# Patient Record
Sex: Female | Born: 1945 | Race: White | Hispanic: No | Marital: Married | Smoking: Former smoker
Health system: Southern US, Community
[De-identification: ages and names within clinical notes are randomized; demographics above are authoritative.]

## PROBLEM LIST (undated history)

## (undated) DIAGNOSIS — R29898 Other symptoms and signs involving the musculoskeletal system: Secondary | ICD-10-CM

## (undated) DIAGNOSIS — D6861 Antiphospholipid syndrome: Secondary | ICD-10-CM

## (undated) DIAGNOSIS — I099 Rheumatic heart disease, unspecified: Secondary | ICD-10-CM

## (undated) DIAGNOSIS — N189 Chronic kidney disease, unspecified: Secondary | ICD-10-CM

## (undated) DIAGNOSIS — M1990 Unspecified osteoarthritis, unspecified site: Secondary | ICD-10-CM

## (undated) DIAGNOSIS — R011 Cardiac murmur, unspecified: Secondary | ICD-10-CM

## (undated) DIAGNOSIS — Z950 Presence of cardiac pacemaker: Secondary | ICD-10-CM

## (undated) DIAGNOSIS — D75829 Heparin-induced thrombocytopenia, unspecified: Secondary | ICD-10-CM

## (undated) DIAGNOSIS — E785 Hyperlipidemia, unspecified: Secondary | ICD-10-CM

## (undated) DIAGNOSIS — I4892 Unspecified atrial flutter: Secondary | ICD-10-CM

## (undated) DIAGNOSIS — I639 Cerebral infarction, unspecified: Secondary | ICD-10-CM

## (undated) DIAGNOSIS — I509 Heart failure, unspecified: Secondary | ICD-10-CM

## (undated) DIAGNOSIS — Z9289 Personal history of other medical treatment: Secondary | ICD-10-CM

## (undated) DIAGNOSIS — Z8639 Personal history of other endocrine, nutritional and metabolic disease: Secondary | ICD-10-CM

## (undated) DIAGNOSIS — M7989 Other specified soft tissue disorders: Secondary | ICD-10-CM

## (undated) DIAGNOSIS — D7582 Heparin induced thrombocytopenia (HIT): Secondary | ICD-10-CM

## (undated) DIAGNOSIS — E039 Hypothyroidism, unspecified: Secondary | ICD-10-CM

## (undated) DIAGNOSIS — D693 Immune thrombocytopenic purpura: Secondary | ICD-10-CM

## (undated) DIAGNOSIS — S065X9A Traumatic subdural hemorrhage with loss of consciousness of unspecified duration, initial encounter: Secondary | ICD-10-CM

## (undated) DIAGNOSIS — R29818 Other symptoms and signs involving the nervous system: Secondary | ICD-10-CM

## (undated) DIAGNOSIS — Y9389 Activity, other specified: Secondary | ICD-10-CM

## (undated) DIAGNOSIS — I10 Essential (primary) hypertension: Secondary | ICD-10-CM

## (undated) DIAGNOSIS — D649 Anemia, unspecified: Secondary | ICD-10-CM

## (undated) DIAGNOSIS — I890 Lymphedema, not elsewhere classified: Secondary | ICD-10-CM

## (undated) HISTORY — PX: TONSILLECTOMY: SHX5217

## (undated) HISTORY — DX: Cerebral infarction, unspecified: I63.9

## (undated) HISTORY — DX: Unspecified atrial flutter: I48.92

## (undated) HISTORY — DX: Antiphospholipid syndrome: D68.61

## (undated) HISTORY — DX: Essential (primary) hypertension: I10

## (undated) HISTORY — PX: AMPUTATION: SHX166

## (undated) HISTORY — PX: TONSILLECTOMY: SUR1361

## (undated) HISTORY — DX: Traumatic subdural hemorrhage with loss of consciousness of unspecified duration, initial encounter: S06.5X9A

## (undated) HISTORY — DX: Personal history of other endocrine, nutritional and metabolic disease: Z86.39

## (undated) HISTORY — DX: Immune thrombocytopenic purpura: D69.3

## (undated) HISTORY — DX: Lymphedema, not elsewhere classified: I89.0

## (undated) HISTORY — DX: Activity, other specified: Y93.89

## (undated) HISTORY — DX: Hyperlipidemia, unspecified: E78.5

## (undated) HISTORY — DX: Heparin induced thrombocytopenia (HIT): D75.82

## (undated) HISTORY — PX: TOTAL ABDOMINAL HYSTERECTOMY: SHX209

## (undated) HISTORY — DX: Heparin-induced thrombocytopenia, unspecified: D75.829

## (undated) HISTORY — DX: Other specified soft tissue disorders: M79.89

## (undated) HISTORY — PX: CARDIAC VALVE REPLACEMENT: SHX585

## (undated) HISTORY — DX: Rheumatic heart disease, unspecified: I09.9

## (undated) HISTORY — PX: ATRIAL ABLATION SURGERY: SHX560

## (undated) HISTORY — PX: CARDIOVERSION: SHX1299

## (undated) HISTORY — PX: CHOLECYSTECTOMY: SHX55

---

## 1993-06-26 DIAGNOSIS — I099 Rheumatic heart disease, unspecified: Secondary | ICD-10-CM

## 1993-06-26 HISTORY — PX: PERCUTANEOUS BALLOON VALVULOPLASTY: SHX270

## 1993-06-26 HISTORY — DX: Rheumatic heart disease, unspecified: I09.9

## 1998-01-15 ENCOUNTER — Other Ambulatory Visit: Admission: RE | Admit: 1998-01-15 | Discharge: 1998-01-15 | Payer: Self-pay | Admitting: Obstetrics and Gynecology

## 2001-09-25 ENCOUNTER — Encounter: Payer: Self-pay | Admitting: Gastroenterology

## 2001-09-25 ENCOUNTER — Encounter: Admission: RE | Admit: 2001-09-25 | Discharge: 2001-09-25 | Payer: Self-pay | Admitting: Gastroenterology

## 2001-09-26 ENCOUNTER — Encounter: Admission: RE | Admit: 2001-09-26 | Discharge: 2001-09-26 | Payer: Self-pay | Admitting: Obstetrics & Gynecology

## 2001-09-26 ENCOUNTER — Encounter: Payer: Self-pay | Admitting: Obstetrics & Gynecology

## 2004-05-11 ENCOUNTER — Ambulatory Visit: Payer: Self-pay | Admitting: Oncology

## 2004-06-08 ENCOUNTER — Ambulatory Visit (HOSPITAL_COMMUNITY): Admission: RE | Admit: 2004-06-08 | Discharge: 2004-06-08 | Payer: Self-pay | Admitting: Cardiology

## 2004-06-08 ENCOUNTER — Encounter (INDEPENDENT_AMBULATORY_CARE_PROVIDER_SITE_OTHER): Payer: Self-pay | Admitting: Cardiology

## 2004-07-11 ENCOUNTER — Ambulatory Visit: Payer: Self-pay | Admitting: Oncology

## 2004-08-29 ENCOUNTER — Ambulatory Visit: Payer: Self-pay | Admitting: Oncology

## 2004-10-14 ENCOUNTER — Ambulatory Visit: Payer: Self-pay | Admitting: Oncology

## 2004-11-02 ENCOUNTER — Encounter: Admission: RE | Admit: 2004-11-02 | Discharge: 2004-11-02 | Payer: Self-pay | Admitting: Gastroenterology

## 2004-11-17 ENCOUNTER — Encounter: Payer: Self-pay | Admitting: Cardiology

## 2004-11-18 ENCOUNTER — Encounter: Payer: Self-pay | Admitting: Cardiology

## 2004-12-15 ENCOUNTER — Ambulatory Visit: Payer: Self-pay | Admitting: Oncology

## 2005-02-16 ENCOUNTER — Ambulatory Visit: Payer: Self-pay | Admitting: Oncology

## 2005-04-18 ENCOUNTER — Ambulatory Visit: Payer: Self-pay | Admitting: Oncology

## 2005-06-15 ENCOUNTER — Ambulatory Visit: Payer: Self-pay | Admitting: Oncology

## 2005-06-26 HISTORY — PX: MITRAL VALVE REPLACEMENT: SHX147

## 2005-07-31 ENCOUNTER — Ambulatory Visit: Payer: Self-pay | Admitting: Oncology

## 2005-09-22 ENCOUNTER — Ambulatory Visit: Payer: Self-pay | Admitting: Oncology

## 2005-10-20 LAB — CBC WITH DIFFERENTIAL/PLATELET
Basophils Absolute: 0.1 10*3/uL (ref 0.0–0.1)
EOS%: 5.5 % (ref 0.0–7.0)
Eosinophils Absolute: 0.2 10*3/uL (ref 0.0–0.5)
HCT: 30.3 % — ABNORMAL LOW (ref 34.8–46.6)
HGB: 10.4 g/dL — ABNORMAL LOW (ref 11.6–15.9)
MCH: 29.7 pg (ref 26.0–34.0)
MCV: 86.9 fL (ref 81.0–101.0)
MONO%: 8.6 % (ref 0.0–13.0)
NEUT%: 64.8 % (ref 39.6–76.8)

## 2005-10-20 LAB — PROTIME-INR

## 2005-11-15 ENCOUNTER — Ambulatory Visit: Payer: Self-pay | Admitting: Oncology

## 2006-01-03 ENCOUNTER — Ambulatory Visit: Payer: Self-pay | Admitting: Oncology

## 2006-01-03 LAB — PROTHROMBIN TIME
INR: 3.7 — ABNORMAL HIGH (ref 0.0–1.5)
Prothrombin Time: 36.4 seconds — ABNORMAL HIGH (ref 11.6–15.2)

## 2006-01-03 LAB — CBC WITH DIFFERENTIAL/PLATELET
BASO%: 0.9 % (ref 0.0–2.0)
EOS%: 4.2 % (ref 0.0–7.0)
MCH: 28.4 pg (ref 26.0–34.0)
MCHC: 33.3 g/dL (ref 32.0–36.0)
MCV: 85.3 fL (ref 81.0–101.0)
MONO%: 5.9 % (ref 0.0–13.0)
NEUT#: 3.9 10*3/uL (ref 1.5–6.5)
RBC: 3.48 10*6/uL — ABNORMAL LOW (ref 3.70–5.32)
RDW: 14.9 % — ABNORMAL HIGH (ref 11.3–14.5)

## 2006-01-03 LAB — PROTIME-INR

## 2006-01-09 ENCOUNTER — Ambulatory Visit (HOSPITAL_COMMUNITY): Admission: RE | Admit: 2006-01-09 | Discharge: 2006-01-09 | Payer: Self-pay | Admitting: Oncology

## 2006-01-11 LAB — PROTIME-INR
INR: 4 — ABNORMAL HIGH (ref 2.00–3.50)
Protime: 24.3 Seconds — ABNORMAL HIGH (ref 10.6–13.4)

## 2006-02-02 LAB — PROTIME-INR: Protime: 16.5 Seconds — ABNORMAL HIGH (ref 10.6–13.4)

## 2006-02-02 LAB — CBC WITH DIFFERENTIAL/PLATELET
Basophils Absolute: 0.1 10*3/uL (ref 0.0–0.1)
Eosinophils Absolute: 0.3 10*3/uL (ref 0.0–0.5)
HGB: 9 g/dL — ABNORMAL LOW (ref 11.6–15.9)
LYMPH%: 18 % (ref 14.0–48.0)
MONO#: 0.4 10*3/uL (ref 0.1–0.9)
NEUT#: 3.6 10*3/uL (ref 1.5–6.5)
Platelets: 87 10*3/uL — ABNORMAL LOW (ref 145–400)
RBC: 3.25 10*6/uL — ABNORMAL LOW (ref 3.70–5.32)
RDW: 13.2 % (ref 11.3–14.5)
WBC: 5.3 10*3/uL (ref 3.9–10.0)

## 2006-02-19 ENCOUNTER — Ambulatory Visit: Payer: Self-pay | Admitting: Oncology

## 2006-02-19 LAB — CBC WITH DIFFERENTIAL/PLATELET
Eosinophils Absolute: 0.2 10*3/uL (ref 0.0–0.5)
LYMPH%: 15.5 % (ref 14.0–48.0)
MONO#: 0.4 10*3/uL (ref 0.1–0.9)
NEUT#: 4.6 10*3/uL (ref 1.5–6.5)
Platelets: 77 10*3/uL — ABNORMAL LOW (ref 145–400)
RBC: 3.39 10*6/uL — ABNORMAL LOW (ref 3.70–5.32)
RDW: 13 % (ref 11.3–14.5)
WBC: 6.2 10*3/uL (ref 3.9–10.0)

## 2006-02-19 LAB — MORPHOLOGY: PLT EST: DECREASED

## 2006-02-19 LAB — PROTIME-INR: Protime: 34.8 Seconds — ABNORMAL HIGH (ref 10.6–13.4)

## 2006-02-28 LAB — CBC WITH DIFFERENTIAL/PLATELET
BASO%: 1 % (ref 0.0–2.0)
EOS%: 4.9 % (ref 0.0–7.0)
MCH: 27.6 pg (ref 26.0–34.0)
MCHC: 33.7 g/dL (ref 32.0–36.0)
RDW: 14.8 % — ABNORMAL HIGH (ref 11.3–14.5)
lymph#: 1 10*3/uL (ref 0.9–3.3)

## 2006-02-28 LAB — PROTIME-INR

## 2006-02-28 LAB — PROTHROMBIN TIME
INR: 3.3 — ABNORMAL HIGH (ref 0.0–1.5)
Prothrombin Time: 36 seconds — ABNORMAL HIGH (ref 11.6–15.2)

## 2006-03-14 LAB — CBC WITH DIFFERENTIAL/PLATELET
BASO%: 1.1 % (ref 0.0–2.0)
HCT: 26.2 % — ABNORMAL LOW (ref 34.8–46.6)
LYMPH%: 10 % — ABNORMAL LOW (ref 14.0–48.0)
MCHC: 31.3 g/dL — ABNORMAL LOW (ref 32.0–36.0)
MCV: 82.3 fL (ref 81.0–101.0)
MONO#: 0.3 10*3/uL (ref 0.1–0.9)
MONO%: 5.2 % (ref 0.0–13.0)
NEUT%: 80.5 % — ABNORMAL HIGH (ref 39.6–76.8)
Platelets: 53 10*3/uL — ABNORMAL LOW (ref 145–400)
WBC: 4.9 10*3/uL (ref 3.9–10.0)

## 2006-04-04 ENCOUNTER — Ambulatory Visit: Payer: Self-pay | Admitting: Oncology

## 2006-04-06 ENCOUNTER — Ambulatory Visit (HOSPITAL_COMMUNITY): Admission: RE | Admit: 2006-04-06 | Discharge: 2006-04-06 | Payer: Self-pay | Admitting: Oncology

## 2006-05-01 LAB — CBC & DIFF AND RETIC
Eosinophils Absolute: 0.3 10*3/uL (ref 0.0–0.5)
HCT: 29.5 % — ABNORMAL LOW (ref 34.8–46.6)
LYMPH%: 7.1 % — ABNORMAL LOW (ref 14.0–48.0)
MCV: 84.6 fL (ref 81.0–101.0)
MONO#: 0.6 10*3/uL (ref 0.1–0.9)
MONO%: 6 % (ref 0.0–13.0)
NEUT#: 8.3 10*3/uL — ABNORMAL HIGH (ref 1.5–6.5)
NEUT%: 84.1 % — ABNORMAL HIGH (ref 39.6–76.8)
Platelets: 161 10*3/uL (ref 145–400)
WBC: 9.9 10*3/uL (ref 3.9–10.0)

## 2006-05-01 LAB — PROTHROMBIN TIME
INR: 3.3 — ABNORMAL HIGH (ref 0.0–1.5)
Prothrombin Time: 35.2 seconds — ABNORMAL HIGH (ref 11.6–15.2)

## 2006-05-01 LAB — CHCC SMEAR

## 2006-05-01 LAB — MORPHOLOGY: PLT EST: ADEQUATE

## 2006-05-01 LAB — BASIC METABOLIC PANEL
Chloride: 102 mEq/L (ref 96–112)
Potassium: 4.1 mEq/L (ref 3.5–5.3)

## 2006-05-01 LAB — PROTIME-INR

## 2006-05-03 LAB — CBC WITH DIFFERENTIAL/PLATELET
BASO%: 0.7 % (ref 0.0–2.0)
EOS%: 3.8 % (ref 0.0–7.0)
HGB: 9.1 g/dL — ABNORMAL LOW (ref 11.6–15.9)
MCH: 28.5 pg (ref 26.0–34.0)
MCHC: 33.3 g/dL (ref 32.0–36.0)
MCV: 85.6 fL (ref 81.0–101.0)
MONO#: 0.4 10*3/uL (ref 0.1–0.9)
NEUT#: 6.9 10*3/uL — ABNORMAL HIGH (ref 1.5–6.5)
NEUT%: 82.2 % — ABNORMAL HIGH (ref 39.6–76.8)
Platelets: 199 10*3/uL (ref 145–400)
RBC: 3.21 10*6/uL — ABNORMAL LOW (ref 3.70–5.32)
lymph#: 0.7 10*3/uL — ABNORMAL LOW (ref 0.9–3.3)

## 2006-05-03 LAB — PROTIME-INR

## 2006-05-03 LAB — PROTHROMBIN TIME: Prothrombin Time: 40.1 seconds — ABNORMAL HIGH (ref 11.6–15.2)

## 2006-05-07 LAB — PROTIME-INR

## 2006-05-08 LAB — PROTIME-INR

## 2006-05-08 LAB — PROTHROMBIN TIME: Prothrombin Time: 45.5 seconds — ABNORMAL HIGH (ref 11.6–15.2)

## 2006-05-09 LAB — PROTIME-INR
INR: 3.7 — ABNORMAL HIGH (ref 2.00–3.50)
Protime: 44.4 Seconds — ABNORMAL HIGH (ref 10.6–13.4)

## 2006-05-14 LAB — CBC WITH DIFFERENTIAL/PLATELET
Basophils Absolute: 0.1 10*3/uL (ref 0.0–0.1)
EOS%: 8.7 % — ABNORMAL HIGH (ref 0.0–7.0)
LYMPH%: 10.1 % — ABNORMAL LOW (ref 14.0–48.0)
MCH: 28.3 pg (ref 26.0–34.0)
MCV: 84.8 fL (ref 81.0–101.0)
MONO%: 6.7 % (ref 0.0–13.0)
Platelets: 142 10*3/uL — ABNORMAL LOW (ref 145–400)
RBC: 3.03 10*6/uL — ABNORMAL LOW (ref 3.70–5.32)
RDW: 16.9 % — ABNORMAL HIGH (ref 11.3–14.5)

## 2006-05-14 LAB — PROTHROMBIN TIME: Prothrombin Time: 43.7 seconds — ABNORMAL HIGH (ref 11.6–15.2)

## 2006-05-15 ENCOUNTER — Encounter (HOSPITAL_COMMUNITY): Admission: RE | Admit: 2006-05-15 | Discharge: 2006-08-13 | Payer: Self-pay | Admitting: Interventional Cardiology

## 2006-05-21 ENCOUNTER — Ambulatory Visit: Payer: Self-pay | Admitting: Oncology

## 2006-05-21 LAB — PROTIME-INR

## 2006-05-22 LAB — PROTHROMBIN TIME: Prothrombin Time: 46.9 seconds — ABNORMAL HIGH (ref 11.6–15.2)

## 2006-05-23 LAB — MORPHOLOGY: PLT EST: DECREASED

## 2006-05-23 LAB — CBC WITH DIFFERENTIAL/PLATELET
Basophils Absolute: 0 10*3/uL (ref 0.0–0.1)
EOS%: 6.6 % (ref 0.0–7.0)
Eosinophils Absolute: 0.6 10*3/uL — ABNORMAL HIGH (ref 0.0–0.5)
HGB: 8.9 g/dL — ABNORMAL LOW (ref 11.6–15.9)
LYMPH%: 7.9 % — ABNORMAL LOW (ref 14.0–48.0)
MCH: 28 pg (ref 26.0–34.0)
MCV: 83.7 fL (ref 81.0–101.0)
MONO%: 7.1 % (ref 0.0–13.0)
NEUT#: 7.1 10*3/uL — ABNORMAL HIGH (ref 1.5–6.5)
NEUT%: 77.9 % — ABNORMAL HIGH (ref 39.6–76.8)
Platelets: 60 10*3/uL — ABNORMAL LOW (ref 145–400)
RDW: 17.4 % — ABNORMAL HIGH (ref 11.3–14.5)

## 2006-05-23 LAB — PROTHROMBIN TIME
INR: 3.9 — ABNORMAL HIGH (ref 0.0–1.5)
Prothrombin Time: 41.2 seconds — ABNORMAL HIGH (ref 11.6–15.2)

## 2006-05-23 LAB — CHCC SMEAR

## 2006-05-24 LAB — PROTHROMBIN TIME
INR: 3.9 — ABNORMAL HIGH (ref 0.0–1.5)
Prothrombin Time: 40.9 seconds — ABNORMAL HIGH (ref 11.6–15.2)

## 2006-05-24 LAB — CBC WITH DIFFERENTIAL/PLATELET
BASO%: 0.5 % (ref 0.0–2.0)
Basophils Absolute: 0 10*3/uL (ref 0.0–0.1)
EOS%: 6.2 % (ref 0.0–7.0)
HGB: 8.8 g/dL — ABNORMAL LOW (ref 11.6–15.9)
MCH: 27.6 pg (ref 26.0–34.0)
MCHC: 33 g/dL (ref 32.0–36.0)
MCV: 83.8 fL (ref 81.0–101.0)
MONO%: 7.3 % (ref 0.0–13.0)
RBC: 3.17 10*6/uL — ABNORMAL LOW (ref 3.70–5.32)
RDW: 17 % — ABNORMAL HIGH (ref 11.3–14.5)
lymph#: 0.7 10*3/uL — ABNORMAL LOW (ref 0.9–3.3)

## 2006-05-24 LAB — MORPHOLOGY

## 2006-05-25 LAB — PROTIME-INR

## 2006-05-29 ENCOUNTER — Encounter: Admission: RE | Admit: 2006-05-29 | Discharge: 2006-08-27 | Payer: Self-pay | Admitting: Neurology

## 2006-05-29 LAB — CBC WITH DIFFERENTIAL/PLATELET
Basophils Absolute: 0.1 10*3/uL (ref 0.0–0.1)
EOS%: 5.7 % (ref 0.0–7.0)
HCT: 26.3 % — ABNORMAL LOW (ref 34.8–46.6)
HGB: 8.7 g/dL — ABNORMAL LOW (ref 11.6–15.9)
MCH: 27.4 pg (ref 26.0–34.0)
MCV: 82.7 fL (ref 81.0–101.0)
MONO%: 5.8 % (ref 0.0–13.0)
NEUT%: 79.1 % — ABNORMAL HIGH (ref 39.6–76.8)

## 2006-05-29 LAB — RETICULOCYTES (CHCC)
ABS Retic: 56.4 10*3/uL (ref 19.0–186.0)
RBC.: 3.32 MIL/uL — ABNORMAL LOW (ref 3.79–4.96)

## 2006-05-29 LAB — AMYLASE: Amylase: 18 U/L — ABNORMAL LOW (ref 27–131)

## 2006-05-29 LAB — PROTHROMBIN TIME: Prothrombin Time: 41.7 seconds — ABNORMAL HIGH (ref 11.6–15.2)

## 2006-05-31 LAB — PROTHROMBIN TIME: Prothrombin Time: 40.1 seconds — ABNORMAL HIGH (ref 11.6–15.2)

## 2006-06-01 LAB — CBC WITH DIFFERENTIAL/PLATELET
BASO%: 0.4 % (ref 0.0–2.0)
EOS%: 5.7 % (ref 0.0–7.0)
MCH: 27.2 pg (ref 26.0–34.0)
MCHC: 32.8 g/dL (ref 32.0–36.0)
MCV: 82.8 fL (ref 81.0–101.0)
MONO%: 5.7 % (ref 0.0–13.0)
NEUT#: 7.8 10*3/uL — ABNORMAL HIGH (ref 1.5–6.5)
RBC: 3.64 10*6/uL — ABNORMAL LOW (ref 3.70–5.32)
RDW: 16.8 % — ABNORMAL HIGH (ref 11.3–14.5)

## 2006-06-01 LAB — PROTHROMBIN TIME
INR: 4.2 — ABNORMAL HIGH (ref 0.0–1.5)
Prothrombin Time: 43.3 seconds — ABNORMAL HIGH (ref 11.6–15.2)

## 2006-06-05 LAB — PROTHROMBIN TIME: INR: 2.1 — ABNORMAL HIGH (ref 0.0–1.5)

## 2006-06-06 LAB — PROTHROMBIN TIME: Prothrombin Time: 24 seconds — ABNORMAL HIGH (ref 11.6–15.2)

## 2006-06-07 LAB — CBC WITH DIFFERENTIAL/PLATELET
Eosinophils Absolute: 0.6 10*3/uL — ABNORMAL HIGH (ref 0.0–0.5)
MONO#: 0.5 10*3/uL (ref 0.1–0.9)
NEUT#: 4.5 10*3/uL (ref 1.5–6.5)
Platelets: 115 10*3/uL — ABNORMAL LOW (ref 145–400)
RBC: 2.98 10*6/uL — ABNORMAL LOW (ref 3.70–5.32)
RDW: 17.5 % — ABNORMAL HIGH (ref 11.3–14.5)
WBC: 6.4 10*3/uL (ref 3.9–10.0)

## 2006-06-07 LAB — PROTHROMBIN TIME
INR: 2 — ABNORMAL HIGH (ref 0.0–1.5)
Prothrombin Time: 23.2 seconds — ABNORMAL HIGH (ref 11.6–15.2)

## 2006-06-08 LAB — CBC WITH DIFFERENTIAL/PLATELET
Basophils Absolute: 0.1 10*3/uL (ref 0.0–0.1)
EOS%: 7.1 % — ABNORMAL HIGH (ref 0.0–7.0)
Eosinophils Absolute: 0.5 10*3/uL (ref 0.0–0.5)
HGB: 8.6 g/dL — ABNORMAL LOW (ref 11.6–15.9)
NEUT#: 5.2 10*3/uL (ref 1.5–6.5)
RDW: 17.8 % — ABNORMAL HIGH (ref 11.3–14.5)
lymph#: 0.5 10*3/uL — ABNORMAL LOW (ref 0.9–3.3)

## 2006-06-08 LAB — BASIC METABOLIC PANEL
BUN: 23 mg/dL (ref 6–23)
Chloride: 103 mEq/L (ref 96–112)
Glucose, Bld: 94 mg/dL (ref 70–99)
Potassium: 4.1 mEq/L (ref 3.5–5.3)

## 2006-06-26 HISTORY — PX: CRANIOTOMY: SHX93

## 2006-06-28 LAB — PROTHROMBIN TIME
INR: 3 — ABNORMAL HIGH (ref 0.0–1.5)
Prothrombin Time: 32.7 seconds — ABNORMAL HIGH (ref 11.6–15.2)

## 2006-07-03 LAB — PROTIME-INR

## 2006-07-03 LAB — BASIC METABOLIC PANEL
BUN: 24 mg/dL — ABNORMAL HIGH (ref 6–23)
Calcium: 8.7 mg/dL (ref 8.4–10.5)
Chloride: 108 mEq/L (ref 96–112)
Creatinine, Ser: 1.57 mg/dL — ABNORMAL HIGH (ref 0.40–1.20)

## 2006-07-03 LAB — PROTHROMBIN TIME: INR: 3.4 — ABNORMAL HIGH (ref 0.0–1.5)

## 2006-07-03 LAB — CBC WITH DIFFERENTIAL/PLATELET
BASO%: 1.4 % (ref 0.0–2.0)
Basophils Absolute: 0.1 10*3/uL (ref 0.0–0.1)
EOS%: 6.1 % (ref 0.0–7.0)
MCH: 27.8 pg (ref 26.0–34.0)
MCHC: 32.8 g/dL (ref 32.0–36.0)
MCV: 84.8 fL (ref 81.0–101.0)
MONO%: 7 % (ref 0.0–13.0)
RBC: 3.23 10*6/uL — ABNORMAL LOW (ref 3.70–5.32)
RDW: 17.2 % — ABNORMAL HIGH (ref 11.3–14.5)

## 2006-07-04 ENCOUNTER — Ambulatory Visit: Payer: Self-pay | Admitting: Oncology

## 2006-07-09 LAB — CBC WITH DIFFERENTIAL/PLATELET
BASO%: 1.2 % (ref 0.0–2.0)
Basophils Absolute: 0.1 10*3/uL (ref 0.0–0.1)
EOS%: 6.3 % (ref 0.0–7.0)
Eosinophils Absolute: 0.3 10*3/uL (ref 0.0–0.5)
HCT: 27.9 % — ABNORMAL LOW (ref 34.8–46.6)
HGB: 9.3 g/dL — ABNORMAL LOW (ref 11.6–15.9)
LYMPH%: 12.4 % — ABNORMAL LOW (ref 14.0–48.0)
MCH: 28 pg (ref 26.0–34.0)
MCHC: 33.2 g/dL (ref 32.0–36.0)
MCV: 84.4 fL (ref 81.0–101.0)
MONO#: 0.3 10*3/uL (ref 0.1–0.9)
MONO%: 7.1 % (ref 0.0–13.0)
NEUT#: 3.5 10*3/uL (ref 1.5–6.5)
NEUT%: 73 % (ref 39.6–76.8)
Platelets: 129 10*3/uL — ABNORMAL LOW (ref 145–400)
RBC: 3.31 10*6/uL — ABNORMAL LOW (ref 3.70–5.32)
RDW: 17.1 % — ABNORMAL HIGH (ref 11.3–14.5)
WBC: 4.8 10*3/uL (ref 3.9–10.0)
lymph#: 0.6 10*3/uL — ABNORMAL LOW (ref 0.9–3.3)

## 2006-07-09 LAB — BASIC METABOLIC PANEL
CO2: 18 mEq/L — ABNORMAL LOW (ref 19–32)
Chloride: 107 mEq/L (ref 96–112)
Creatinine, Ser: 1.54 mg/dL — ABNORMAL HIGH (ref 0.40–1.20)

## 2006-07-09 LAB — PROTHROMBIN TIME: INR: 3.6 — ABNORMAL HIGH (ref 0.0–1.5)

## 2006-07-16 LAB — CBC WITH DIFFERENTIAL/PLATELET
Basophils Absolute: 0.1 10*3/uL (ref 0.0–0.1)
EOS%: 6.5 % (ref 0.0–7.0)
HCT: 31.2 % — ABNORMAL LOW (ref 34.8–46.6)
HGB: 10 g/dL — ABNORMAL LOW (ref 11.6–15.9)
LYMPH%: 18.1 % (ref 14.0–48.0)
MCH: 27.4 pg (ref 26.0–34.0)
MCV: 85.6 fL (ref 81.0–101.0)
MONO%: 8.1 % (ref 0.0–13.0)
NEUT%: 65.9 % (ref 39.6–76.8)
Platelets: 160 10*3/uL (ref 145–400)
RDW: 17.1 % — ABNORMAL HIGH (ref 11.3–14.5)

## 2006-07-16 LAB — PROTHROMBIN TIME
INR: 2.4 — ABNORMAL HIGH (ref 0.0–1.5)
Prothrombin Time: 27.1 seconds — ABNORMAL HIGH (ref 11.6–15.2)

## 2006-07-16 LAB — BASIC METABOLIC PANEL
BUN: 38 mg/dL — ABNORMAL HIGH (ref 6–23)
Calcium: 8.8 mg/dL (ref 8.4–10.5)
Creatinine, Ser: 1.54 mg/dL — ABNORMAL HIGH (ref 0.40–1.20)

## 2006-07-20 LAB — PROTHROMBIN TIME
INR: 2.6 — ABNORMAL HIGH (ref 0.0–1.5)
Prothrombin Time: 29.3 seconds — ABNORMAL HIGH (ref 11.6–15.2)

## 2006-08-07 ENCOUNTER — Encounter: Admission: RE | Admit: 2006-08-07 | Discharge: 2006-08-21 | Payer: Self-pay | Admitting: Orthopedic Surgery

## 2006-08-07 LAB — CBC WITH DIFFERENTIAL/PLATELET
BASO%: 0.5 % (ref 0.0–2.0)
Eosinophils Absolute: 0.2 10*3/uL (ref 0.0–0.5)
LYMPH%: 8.3 % — ABNORMAL LOW (ref 14.0–48.0)
MCHC: 34.2 g/dL (ref 32.0–36.0)
MCV: 83.7 fL (ref 81.0–101.0)
MONO%: 5.5 % (ref 0.0–13.0)
NEUT%: 81.9 % — ABNORMAL HIGH (ref 39.6–76.8)
Platelets: 117 10*3/uL — ABNORMAL LOW (ref 145–400)
RBC: 3.4 10*6/uL — ABNORMAL LOW (ref 3.70–5.32)

## 2006-08-07 LAB — PROTHROMBIN TIME: Prothrombin Time: 28.2 seconds — ABNORMAL HIGH (ref 11.6–15.2)

## 2006-08-14 ENCOUNTER — Encounter (HOSPITAL_COMMUNITY): Admission: RE | Admit: 2006-08-14 | Discharge: 2006-09-21 | Payer: Self-pay | Admitting: Interventional Cardiology

## 2006-08-20 ENCOUNTER — Ambulatory Visit: Payer: Self-pay | Admitting: Oncology

## 2006-08-22 LAB — CBC WITH DIFFERENTIAL/PLATELET
Basophils Absolute: 0 10*3/uL (ref 0.0–0.1)
EOS%: 3.8 % (ref 0.0–7.0)
HGB: 10.4 g/dL — ABNORMAL LOW (ref 11.6–15.9)
LYMPH%: 18.1 % (ref 14.0–48.0)
MCH: 28.4 pg (ref 26.0–34.0)
MCV: 83.6 fL (ref 81.0–101.0)
MONO%: 8.4 % (ref 0.0–13.0)
Platelets: 125 10*3/uL — ABNORMAL LOW (ref 145–400)
RDW: 16.4 % — ABNORMAL HIGH (ref 11.3–14.5)

## 2006-08-22 LAB — PROTHROMBIN TIME: INR: 3 — ABNORMAL HIGH (ref 0.0–1.5)

## 2006-08-28 ENCOUNTER — Encounter: Admission: RE | Admit: 2006-08-28 | Discharge: 2006-11-26 | Payer: Self-pay | Admitting: Neurology

## 2006-09-07 ENCOUNTER — Ambulatory Visit: Payer: Self-pay | Admitting: Internal Medicine

## 2006-09-12 LAB — CBC WITH DIFFERENTIAL/PLATELET
BASO%: 0.9 % (ref 0.0–2.0)
Eosinophils Absolute: 0.1 10*3/uL (ref 0.0–0.5)
LYMPH%: 13.6 % — ABNORMAL LOW (ref 14.0–48.0)
MCHC: 34.2 g/dL (ref 32.0–36.0)
MONO#: 0.4 10*3/uL (ref 0.1–0.9)
MONO%: 7.3 % (ref 0.0–13.0)
NEUT#: 4 10*3/uL (ref 1.5–6.5)
RBC: 3.92 10*6/uL (ref 3.70–5.32)
RDW: 15.7 % — ABNORMAL HIGH (ref 11.3–14.5)
WBC: 5.2 10*3/uL (ref 3.9–10.0)

## 2006-09-12 LAB — PROTHROMBIN TIME
INR: 1.8 — ABNORMAL HIGH (ref 0.0–1.5)
Prothrombin Time: 21.2 seconds — ABNORMAL HIGH (ref 11.6–15.2)

## 2006-09-17 LAB — PROTHROMBIN TIME: INR: 3 — ABNORMAL HIGH (ref 0.0–1.5)

## 2006-09-24 LAB — PROTHROMBIN TIME: Prothrombin Time: 36.2 seconds — ABNORMAL HIGH (ref 11.6–15.2)

## 2006-09-27 LAB — CBC WITH DIFFERENTIAL/PLATELET
Basophils Absolute: 0 10*3/uL (ref 0.0–0.1)
EOS%: 3.7 % (ref 0.0–7.0)
Eosinophils Absolute: 0.2 10*3/uL (ref 0.0–0.5)
HGB: 9.8 g/dL — ABNORMAL LOW (ref 11.6–15.9)
LYMPH%: 17.6 % (ref 14.0–48.0)
MCH: 29.2 pg (ref 26.0–34.0)
MCV: 84.5 fL (ref 81.0–101.0)
MONO%: 8.8 % (ref 0.0–13.0)
NEUT#: 2.9 10*3/uL (ref 1.5–6.5)
NEUT%: 69.1 % (ref 39.6–76.8)
Platelets: 117 10*3/uL — ABNORMAL LOW (ref 145–400)
RDW: 16 % — ABNORMAL HIGH (ref 11.3–14.5)

## 2006-09-27 LAB — PROTHROMBIN TIME
INR: 2.8 — ABNORMAL HIGH (ref 0.0–1.5)
Prothrombin Time: 31.1 seconds — ABNORMAL HIGH (ref 11.6–15.2)

## 2006-09-27 LAB — MORPHOLOGY: PLT EST: DECREASED

## 2006-09-27 LAB — COMPREHENSIVE METABOLIC PANEL
ALT: 34 U/L (ref 0–35)
AST: 23 U/L (ref 0–37)
Alkaline Phosphatase: 97 U/L (ref 39–117)
Chloride: 112 mEq/L (ref 96–112)
Creatinine, Ser: 1.46 mg/dL — ABNORMAL HIGH (ref 0.40–1.20)
Total Bilirubin: 0.4 mg/dL (ref 0.3–1.2)

## 2006-10-01 ENCOUNTER — Ambulatory Visit: Payer: Self-pay | Admitting: Oncology

## 2006-10-01 LAB — PROTHROMBIN TIME: INR: 2.9 — ABNORMAL HIGH (ref 0.0–1.5)

## 2006-10-15 LAB — PROTHROMBIN TIME
INR: 2.5 — ABNORMAL HIGH (ref 0.0–1.5)
Prothrombin Time: 28.7 seconds — ABNORMAL HIGH (ref 11.6–15.2)

## 2006-10-29 LAB — PROTHROMBIN TIME: Prothrombin Time: 43 seconds — ABNORMAL HIGH (ref 11.6–15.2)

## 2006-11-13 ENCOUNTER — Ambulatory Visit: Payer: Self-pay | Admitting: Oncology

## 2006-11-27 ENCOUNTER — Encounter: Admission: RE | Admit: 2006-11-27 | Discharge: 2007-02-25 | Payer: Self-pay | Admitting: Neurology

## 2006-12-04 LAB — CBC WITH DIFFERENTIAL/PLATELET
BASO%: 1.8 % (ref 0.0–2.0)
Eosinophils Absolute: 0.2 10*3/uL (ref 0.0–0.5)
HCT: 31.1 % — ABNORMAL LOW (ref 34.8–46.6)
LYMPH%: 19.7 % (ref 14.0–48.0)
MCHC: 34.9 g/dL (ref 32.0–36.0)
MONO#: 0.5 10*3/uL (ref 0.1–0.9)
NEUT%: 65 % (ref 39.6–76.8)
Platelets: 124 10*3/uL — ABNORMAL LOW (ref 145–400)
WBC: 5.4 10*3/uL (ref 3.9–10.0)

## 2007-01-04 ENCOUNTER — Ambulatory Visit: Payer: Self-pay | Admitting: Oncology

## 2007-01-08 LAB — MORPHOLOGY: PLT EST: DECREASED

## 2007-01-08 LAB — CBC WITH DIFFERENTIAL/PLATELET
EOS%: 3.6 % (ref 0.0–7.0)
Eosinophils Absolute: 0.2 10*3/uL (ref 0.0–0.5)
MCH: 31.8 pg (ref 26.0–34.0)
MCV: 89.3 fL (ref 81.0–101.0)
MONO%: 7.3 % (ref 0.0–13.0)
NEUT#: 4 10*3/uL (ref 1.5–6.5)
RBC: 3.51 10*6/uL — ABNORMAL LOW (ref 3.70–5.32)
RDW: 14.3 % (ref 11.3–14.5)
lymph#: 0.9 10*3/uL (ref 0.9–3.3)

## 2007-01-11 LAB — COMPREHENSIVE METABOLIC PANEL
CO2: 24 mEq/L (ref 19–32)
Creatinine, Ser: 1.35 mg/dL — ABNORMAL HIGH (ref 0.40–1.20)
Glucose, Bld: 105 mg/dL — ABNORMAL HIGH (ref 70–99)
Total Bilirubin: 0.6 mg/dL (ref 0.3–1.2)

## 2007-01-11 LAB — IRON AND TIBC
Iron: 49 ug/dL (ref 42–145)
TIBC: 280 ug/dL (ref 250–470)
UIBC: 231 ug/dL

## 2007-01-11 LAB — LACTATE DEHYDROGENASE: LDH: 304 U/L — ABNORMAL HIGH (ref 94–250)

## 2007-01-11 LAB — FERRITIN: Ferritin: 143 ng/mL (ref 10–291)

## 2007-02-12 LAB — CBC WITH DIFFERENTIAL/PLATELET
BASO%: 1 % (ref 0.0–2.0)
HCT: 32.7 % — ABNORMAL LOW (ref 34.8–46.6)
LYMPH%: 18.8 % (ref 14.0–48.0)
MCH: 31.2 pg (ref 26.0–34.0)
MCHC: 35.1 g/dL (ref 32.0–36.0)
MCV: 89 fL (ref 81.0–101.0)
MONO#: 0.3 10*3/uL (ref 0.1–0.9)
NEUT%: 67.5 % (ref 39.6–76.8)
Platelets: 82 10*3/uL — ABNORMAL LOW (ref 145–400)
WBC: 3.9 10*3/uL (ref 3.9–10.0)

## 2007-03-11 ENCOUNTER — Ambulatory Visit: Payer: Self-pay | Admitting: Oncology

## 2007-03-13 LAB — PROTHROMBIN TIME: INR: 2.7 — ABNORMAL HIGH (ref 0.0–1.5)

## 2007-03-27 LAB — CBC WITH DIFFERENTIAL/PLATELET
BASO%: 0.7 % (ref 0.0–2.0)
Eosinophils Absolute: 0.2 10*3/uL (ref 0.0–0.5)
HCT: 33 % — ABNORMAL LOW (ref 34.8–46.6)
MCHC: 35.4 g/dL (ref 32.0–36.0)
MONO#: 0.4 10*3/uL (ref 0.1–0.9)
NEUT#: 4.3 10*3/uL (ref 1.5–6.5)
NEUT%: 72.9 % (ref 39.6–76.8)
RBC: 3.63 10*6/uL — ABNORMAL LOW (ref 3.70–5.32)
WBC: 5.9 10*3/uL (ref 3.9–10.0)
lymph#: 0.9 10*3/uL (ref 0.9–3.3)

## 2007-03-27 LAB — PROTHROMBIN TIME: Prothrombin Time: 30.3 seconds — ABNORMAL HIGH (ref 11.6–15.2)

## 2007-05-14 ENCOUNTER — Ambulatory Visit: Payer: Self-pay | Admitting: Oncology

## 2007-05-14 LAB — COMPREHENSIVE METABOLIC PANEL
AST: 30 U/L (ref 0–37)
Alkaline Phosphatase: 77 U/L (ref 39–117)
BUN: 25 mg/dL — ABNORMAL HIGH (ref 6–23)
Calcium: 8.4 mg/dL (ref 8.4–10.5)
Creatinine, Ser: 1.38 mg/dL — ABNORMAL HIGH (ref 0.40–1.20)
Total Bilirubin: 0.5 mg/dL (ref 0.3–1.2)

## 2007-05-14 LAB — MORPHOLOGY: PLT EST: DECREASED

## 2007-05-14 LAB — CBC WITH DIFFERENTIAL/PLATELET
Eosinophils Absolute: 0.1 10*3/uL (ref 0.0–0.5)
MONO#: 0.3 10*3/uL (ref 0.1–0.9)
NEUT#: 3.7 10*3/uL (ref 1.5–6.5)
RBC: 3.36 10*6/uL — ABNORMAL LOW (ref 3.70–5.32)
RDW: 13.8 % (ref 11.3–14.5)
WBC: 4.9 10*3/uL (ref 3.9–10.0)

## 2007-05-14 LAB — LIPID PANEL
HDL: 38 mg/dL — ABNORMAL LOW (ref >39)
LDL Cholesterol: 145 mg/dL — ABNORMAL HIGH (ref 0–99)

## 2007-05-14 LAB — LACTATE DEHYDROGENASE: LDH: 286 U/L — ABNORMAL HIGH (ref 94–250)

## 2007-05-14 LAB — PROTHROMBIN TIME: Prothrombin Time: 41.6 seconds — ABNORMAL HIGH (ref 11.6–15.2)

## 2007-05-17 LAB — PROTHROMBIN TIME: INR: 2.6 — ABNORMAL HIGH (ref 0.0–1.5)

## 2007-05-17 LAB — TSH: TSH: 0.084 u[IU]/mL — ABNORMAL LOW (ref 0.350–5.500)

## 2007-06-26 ENCOUNTER — Ambulatory Visit: Payer: Self-pay | Admitting: Oncology

## 2007-06-27 DIAGNOSIS — S065X9A Traumatic subdural hemorrhage with loss of consciousness of unspecified duration, initial encounter: Secondary | ICD-10-CM

## 2007-06-27 DIAGNOSIS — S065XAA Traumatic subdural hemorrhage with loss of consciousness status unknown, initial encounter: Secondary | ICD-10-CM

## 2007-06-27 HISTORY — DX: Traumatic subdural hemorrhage with loss of consciousness status unknown, initial encounter: S06.5XAA

## 2007-06-27 HISTORY — DX: Traumatic subdural hemorrhage with loss of consciousness of unspecified duration, initial encounter: S06.5X9A

## 2007-07-10 LAB — CBC WITH DIFFERENTIAL/PLATELET
Eosinophils Absolute: 0.1 10*3/uL (ref 0.0–0.5)
HCT: 30.3 % — ABNORMAL LOW (ref 34.8–46.6)
LYMPH%: 17.4 % (ref 14.0–48.0)
MCHC: 35 g/dL (ref 32.0–36.0)
MCV: 91.3 fL (ref 81.0–101.0)
MONO#: 0.3 10*3/uL (ref 0.1–0.9)
MONO%: 7.1 % (ref 0.0–13.0)
NEUT#: 2.7 10*3/uL (ref 1.5–6.5)
NEUT%: 71.2 % (ref 39.6–76.8)
Platelets: 115 10*3/uL — ABNORMAL LOW (ref 145–400)
RBC: 3.32 10*6/uL — ABNORMAL LOW (ref 3.70–5.32)
WBC: 3.9 10*3/uL (ref 3.9–10.0)

## 2007-07-10 LAB — HEPARIN ANTI-XA: Heparin LMW: 1.58 IU/mL

## 2007-07-13 LAB — KEPPRA (LEVETIRACETAM) LEVEL: Levetiracetam Lvl: 49.4 ug/mL

## 2007-07-13 LAB — BASIC METABOLIC PANEL
CO2: 22 mEq/L (ref 19–32)
Calcium: 9.2 mg/dL (ref 8.4–10.5)
Creatinine, Ser: 1.27 mg/dL — ABNORMAL HIGH (ref 0.40–1.20)
Glucose, Bld: 95 mg/dL (ref 70–99)
Sodium: 139 mEq/L (ref 135–145)

## 2007-07-15 LAB — HEPARIN ANTI-XA: Heparin LMW: 1.32 IU/mL

## 2007-07-15 LAB — PROTHROMBIN TIME: INR: 1.3 (ref 0.0–1.5)

## 2007-07-22 LAB — PROTHROMBIN TIME: INR: 2.4 — ABNORMAL HIGH (ref 0.0–1.5)

## 2007-07-24 ENCOUNTER — Encounter: Payer: Self-pay | Admitting: Oncology

## 2007-07-24 ENCOUNTER — Inpatient Hospital Stay (HOSPITAL_COMMUNITY): Admission: EM | Admit: 2007-07-24 | Discharge: 2007-08-01 | Payer: Self-pay | Admitting: Emergency Medicine

## 2007-07-24 ENCOUNTER — Ambulatory Visit: Payer: Self-pay | Admitting: Internal Medicine

## 2007-07-26 ENCOUNTER — Encounter (INDEPENDENT_AMBULATORY_CARE_PROVIDER_SITE_OTHER): Payer: Self-pay | Admitting: Interventional Cardiology

## 2007-08-02 LAB — CBC WITH DIFFERENTIAL/PLATELET
Basophils Absolute: 0.1 10*3/uL (ref 0.0–0.1)
Eosinophils Absolute: 0.2 10*3/uL (ref 0.0–0.5)
HCT: 34.6 % — ABNORMAL LOW (ref 34.8–46.6)
HGB: 12.3 g/dL (ref 11.6–15.9)
MCH: 31.1 pg (ref 26.0–34.0)
NEUT#: 13.9 10*3/uL — ABNORMAL HIGH (ref 1.5–6.5)
NEUT%: 89.4 % — ABNORMAL HIGH (ref 39.6–76.8)
RDW: 10.7 % — ABNORMAL LOW (ref 11.3–14.5)
lymph#: 0.9 10*3/uL (ref 0.9–3.3)

## 2007-08-05 LAB — CBC WITH DIFFERENTIAL/PLATELET
Basophils Absolute: 0 10*3/uL (ref 0.0–0.1)
EOS%: 0.8 % (ref 0.0–7.0)
Eosinophils Absolute: 0.1 10*3/uL (ref 0.0–0.5)
HCT: 36 % (ref 34.8–46.6)
HGB: 12.6 g/dL (ref 11.6–15.9)
LYMPH%: 5.9 % — ABNORMAL LOW (ref 14.0–48.0)
MCH: 31.5 pg (ref 26.0–34.0)
MCV: 89.9 fL (ref 81.0–101.0)
MONO%: 2.7 % (ref 0.0–13.0)
NEUT#: 14.9 10*3/uL — ABNORMAL HIGH (ref 1.5–6.5)
NEUT%: 90.6 % — ABNORMAL HIGH (ref 39.6–76.8)
Platelets: 105 10*3/uL — ABNORMAL LOW (ref 145–400)

## 2007-08-05 LAB — BASIC METABOLIC PANEL
BUN: 37 mg/dL — ABNORMAL HIGH (ref 6–23)
Calcium: 9 mg/dL (ref 8.4–10.5)
Creatinine, Ser: 1.74 mg/dL — ABNORMAL HIGH (ref 0.40–1.20)
Glucose, Bld: 126 mg/dL — ABNORMAL HIGH (ref 70–99)
Potassium: 3.8 mEq/L (ref 3.5–5.3)

## 2007-08-06 ENCOUNTER — Ambulatory Visit: Payer: Self-pay | Admitting: Oncology

## 2007-08-08 LAB — CBC WITH DIFFERENTIAL/PLATELET
BASO%: 0 % (ref 0.0–2.0)
LYMPH%: 3.5 % — ABNORMAL LOW (ref 14.0–48.0)
MCHC: 35 g/dL (ref 32.0–36.0)
MONO#: 0.1 10*3/uL (ref 0.1–0.9)
MONO%: 0.9 % (ref 0.0–13.0)
Platelets: 108 10*3/uL — ABNORMAL LOW (ref 145–400)
RBC: 3.76 10*6/uL (ref 3.70–5.32)
RDW: 13.5 % (ref 11.3–14.5)
WBC: 12.2 10*3/uL — ABNORMAL HIGH (ref 3.9–10.0)

## 2007-08-09 ENCOUNTER — Encounter: Admission: RE | Admit: 2007-08-09 | Discharge: 2007-08-09 | Payer: Self-pay | Admitting: Neurology

## 2007-08-13 LAB — CBC WITH DIFFERENTIAL/PLATELET
EOS%: 0.2 % (ref 0.0–7.0)
Eosinophils Absolute: 0 10*3/uL (ref 0.0–0.5)
LYMPH%: 4.2 % — ABNORMAL LOW (ref 14.0–48.0)
MCH: 31.9 pg (ref 26.0–34.0)
MCV: 90.9 fL (ref 81.0–101.0)
MONO%: 1.1 % (ref 0.0–13.0)
Platelets: 95 10*3/uL — ABNORMAL LOW (ref 145–400)
RBC: 3.48 10*6/uL — ABNORMAL LOW (ref 3.70–5.32)
RDW: 14.1 % (ref 11.3–14.5)

## 2007-08-13 LAB — HEPARIN ANTI-XA: Heparin LMW: 1.16 IU/mL

## 2007-08-14 ENCOUNTER — Encounter: Admission: RE | Admit: 2007-08-14 | Discharge: 2007-08-14 | Payer: Self-pay | Admitting: Oncology

## 2007-08-20 LAB — BASIC METABOLIC PANEL
Calcium: 8.4 mg/dL (ref 8.4–10.5)
Creatinine, Ser: 1.36 mg/dL — ABNORMAL HIGH (ref 0.40–1.20)
Sodium: 141 mEq/L (ref 135–145)

## 2007-08-20 LAB — CBC WITH DIFFERENTIAL/PLATELET
BASO%: 0.3 % (ref 0.0–2.0)
EOS%: 0.7 % (ref 0.0–7.0)
HCT: 32 % — ABNORMAL LOW (ref 34.8–46.6)
LYMPH%: 3.4 % — ABNORMAL LOW (ref 14.0–48.0)
MCH: 31.9 pg (ref 26.0–34.0)
MCHC: 34.8 g/dL (ref 32.0–36.0)
MONO#: 0.2 10*3/uL (ref 0.1–0.9)
NEUT%: 93.5 % — ABNORMAL HIGH (ref 39.6–76.8)
Platelets: 116 10*3/uL — ABNORMAL LOW (ref 145–400)
RBC: 3.49 10*6/uL — ABNORMAL LOW (ref 3.70–5.32)
WBC: 10.6 10*3/uL — ABNORMAL HIGH (ref 3.9–10.0)
lymph#: 0.4 10*3/uL — ABNORMAL LOW (ref 0.9–3.3)

## 2007-08-20 LAB — PROTHROMBIN TIME
INR: 0.9 (ref 0.0–1.5)
Prothrombin Time: 12.7 seconds (ref 11.6–15.2)

## 2007-08-28 ENCOUNTER — Encounter: Admission: RE | Admit: 2007-08-28 | Discharge: 2007-08-28 | Payer: Self-pay | Admitting: Neurology

## 2007-08-28 LAB — CBC WITH DIFFERENTIAL/PLATELET
Eosinophils Absolute: 0.2 10*3/uL (ref 0.0–0.5)
LYMPH%: 19.3 % (ref 14.0–48.0)
MONO#: 0.4 10*3/uL (ref 0.1–0.9)
NEUT#: 4.7 10*3/uL (ref 1.5–6.5)
Platelets: 124 10*3/uL — ABNORMAL LOW (ref 145–400)
RBC: 3.82 10*6/uL (ref 3.70–5.32)
WBC: 6.6 10*3/uL (ref 3.9–10.0)

## 2007-08-28 LAB — HEPARIN ANTI-XA: Heparin LMW: 1.25 IU/mL

## 2007-09-02 LAB — HEPARIN ANTI-XA: Heparin LMW: 0.68 IU/mL

## 2007-09-02 LAB — IVY BLEEDING TIME: Bleeding Time: 11.5 Minutes — ABNORMAL HIGH (ref 2.0–8.0)

## 2007-09-03 LAB — HEPARIN ANTI-XA: Heparin LMW: 0.45 IU/mL

## 2007-09-07 ENCOUNTER — Inpatient Hospital Stay (HOSPITAL_COMMUNITY): Admission: EM | Admit: 2007-09-07 | Discharge: 2007-09-09 | Payer: Self-pay | Admitting: Emergency Medicine

## 2007-09-11 LAB — VON WILLEBRAND PANEL
Factor-VIII Activity: 81 % (ref 75–150)
Von Willebrand Ag: 286 % normal — ABNORMAL HIGH (ref 61–164)

## 2007-09-20 ENCOUNTER — Ambulatory Visit: Payer: Self-pay | Admitting: Oncology

## 2007-09-30 ENCOUNTER — Encounter: Admission: RE | Admit: 2007-09-30 | Discharge: 2007-09-30 | Payer: Self-pay | Admitting: Neurosurgery

## 2007-10-01 LAB — BASIC METABOLIC PANEL
BUN: 42 mg/dL — ABNORMAL HIGH (ref 6–23)
Chloride: 115 mEq/L — ABNORMAL HIGH (ref 96–112)
Potassium: 4.1 mEq/L (ref 3.5–5.3)

## 2007-10-01 LAB — HEPARIN ANTI-XA: Heparin LMW: 0.5 IU/mL

## 2007-10-22 LAB — BASIC METABOLIC PANEL
Calcium: 8.4 mg/dL (ref 8.4–10.5)
Creatinine, Ser: 1.7 mg/dL — ABNORMAL HIGH (ref 0.40–1.20)
Potassium: 4 mEq/L (ref 3.5–5.3)
Sodium: 138 mEq/L (ref 135–145)

## 2007-10-22 LAB — CBC WITH DIFFERENTIAL/PLATELET
Basophils Absolute: 0 10*3/uL (ref 0.0–0.1)
Eosinophils Absolute: 0.2 10*3/uL (ref 0.0–0.5)
HGB: 10.8 g/dL — ABNORMAL LOW (ref 11.6–15.9)
LYMPH%: 14.4 % (ref 14.0–48.0)
MCV: 90.5 fL (ref 81.0–101.0)
MONO#: 0.3 10*3/uL (ref 0.1–0.9)
MONO%: 6.8 % (ref 0.0–13.0)
NEUT#: 3.8 10*3/uL (ref 1.5–6.5)
Platelets: 106 10*3/uL — ABNORMAL LOW (ref 145–400)
RBC: 3.45 10*6/uL — ABNORMAL LOW (ref 3.70–5.32)
RDW: 12.8 % (ref 11.3–14.5)
WBC: 5.1 10*3/uL (ref 3.9–10.0)

## 2007-11-14 ENCOUNTER — Ambulatory Visit: Payer: Self-pay | Admitting: Oncology

## 2007-11-15 ENCOUNTER — Encounter: Admission: RE | Admit: 2007-11-15 | Discharge: 2007-11-15 | Payer: Self-pay | Admitting: Neurology

## 2007-11-20 LAB — HEPARIN ANTI-XA: Heparin LMW: 0.97 IU/mL

## 2007-12-17 LAB — CBC WITH DIFFERENTIAL/PLATELET
Basophils Absolute: 0.1 10*3/uL (ref 0.0–0.1)
EOS%: 2.9 % (ref 0.0–7.0)
HGB: 11.7 g/dL (ref 11.6–15.9)
LYMPH%: 13.1 % — ABNORMAL LOW (ref 14.0–48.0)
MCH: 30.5 pg (ref 26.0–34.0)
MCV: 87.1 fL (ref 81.0–101.0)
MONO%: 5.3 % (ref 0.0–13.0)
RBC: 3.83 10*6/uL (ref 3.70–5.32)
RDW: 10.6 % — ABNORMAL LOW (ref 11.3–14.5)

## 2007-12-17 LAB — MORPHOLOGY

## 2008-01-10 ENCOUNTER — Ambulatory Visit: Payer: Self-pay | Admitting: Oncology

## 2008-01-15 LAB — CBC WITH DIFFERENTIAL/PLATELET
BASO%: 1 % (ref 0.0–2.0)
Basophils Absolute: 0 10*3/uL (ref 0.0–0.1)
EOS%: 3.7 % (ref 0.0–7.0)
HGB: 10.5 g/dL — ABNORMAL LOW (ref 11.6–15.9)
MCH: 30.6 pg (ref 26.0–34.0)
MCHC: 35.2 g/dL (ref 32.0–36.0)
MONO#: 0.3 10*3/uL (ref 0.1–0.9)
RDW: 12.6 % (ref 11.3–14.5)
WBC: 3.7 10*3/uL — ABNORMAL LOW (ref 3.9–10.0)
lymph#: 0.8 10*3/uL — ABNORMAL LOW (ref 0.9–3.3)

## 2008-01-15 LAB — COMPREHENSIVE METABOLIC PANEL
BUN: 38 mg/dL — ABNORMAL HIGH (ref 6–23)
CO2: 20 mEq/L (ref 19–32)
Calcium: 8.7 mg/dL (ref 8.4–10.5)
Chloride: 109 mEq/L (ref 96–112)
Creatinine, Ser: 1.54 mg/dL — ABNORMAL HIGH (ref 0.40–1.20)

## 2008-01-15 LAB — LACTATE DEHYDROGENASE: LDH: 211 U/L (ref 94–250)

## 2008-01-22 LAB — OTHER SOLSTAS TEST

## 2008-02-11 LAB — CBC WITH DIFFERENTIAL/PLATELET
BASO%: 0.7 % (ref 0.0–2.0)
Eosinophils Absolute: 0.2 10*3/uL (ref 0.0–0.5)
LYMPH%: 22.1 % (ref 14.0–48.0)
MCHC: 35.5 g/dL (ref 32.0–36.0)
MONO#: 0.3 10*3/uL (ref 0.1–0.9)
NEUT#: 3 10*3/uL (ref 1.5–6.5)
Platelets: 95 10*3/uL — ABNORMAL LOW (ref 145–400)
RBC: 3.51 10*6/uL — ABNORMAL LOW (ref 3.70–5.32)
WBC: 4.5 10*3/uL (ref 3.9–10.0)
lymph#: 1 10*3/uL (ref 0.9–3.3)

## 2008-02-11 LAB — BASIC METABOLIC PANEL WITH GFR
BUN: 66 mg/dL — ABNORMAL HIGH (ref 6–23)
CO2: 19 meq/L (ref 19–32)
Calcium: 8.6 mg/dL (ref 8.4–10.5)
Chloride: 108 meq/L (ref 96–112)
Creatinine, Ser: 1.92 mg/dL — ABNORMAL HIGH (ref 0.40–1.20)
Glucose, Bld: 91 mg/dL (ref 70–99)
Potassium: 4.3 meq/L (ref 3.5–5.3)
Sodium: 139 meq/L (ref 135–145)

## 2008-02-11 LAB — HEPARIN ANTI-XA: Heparin LMW: 0.66 [IU]/mL

## 2008-02-14 LAB — BASIC METABOLIC PANEL
Calcium: 8.7 mg/dL (ref 8.4–10.5)
Glucose, Bld: 91 mg/dL (ref 70–99)
Potassium: 4.8 mEq/L (ref 3.5–5.3)
Sodium: 137 mEq/L (ref 135–145)

## 2008-02-14 LAB — HEPARIN ANTI-XA: Heparin LMW: 0.97 IU/mL

## 2008-03-13 ENCOUNTER — Ambulatory Visit: Payer: Self-pay | Admitting: Oncology

## 2008-03-17 LAB — COMPREHENSIVE METABOLIC PANEL
AST: 17 U/L (ref 0–37)
Alkaline Phosphatase: 90 U/L (ref 39–117)
BUN: 51 mg/dL — ABNORMAL HIGH (ref 6–23)
Calcium: 8.4 mg/dL (ref 8.4–10.5)
Chloride: 107 mEq/L (ref 96–112)
Creatinine, Ser: 1.69 mg/dL — ABNORMAL HIGH (ref 0.40–1.20)
Glucose, Bld: 96 mg/dL (ref 70–99)

## 2008-03-17 LAB — CBC WITH DIFFERENTIAL/PLATELET
Basophils Absolute: 0 10*3/uL (ref 0.0–0.1)
Eosinophils Absolute: 0.2 10*3/uL (ref 0.0–0.5)
HGB: 10.6 g/dL — ABNORMAL LOW (ref 11.6–15.9)
MONO#: 0.3 10*3/uL (ref 0.1–0.9)
NEUT#: 4.1 10*3/uL (ref 1.5–6.5)
RDW: 13 % (ref 11.3–14.5)
WBC: 5.6 10*3/uL (ref 3.9–10.0)
lymph#: 1 10*3/uL (ref 0.9–3.3)

## 2008-03-17 LAB — MORPHOLOGY: RBC Comments: NORMAL

## 2008-03-17 LAB — HEPARIN ANTI-XA: Heparin LMW: 1 IU/mL

## 2008-05-08 ENCOUNTER — Ambulatory Visit: Payer: Self-pay | Admitting: Oncology

## 2008-05-12 LAB — CBC WITH DIFFERENTIAL/PLATELET
BASO%: 0.7 % (ref 0.0–2.0)
EOS%: 3.1 % (ref 0.0–7.0)
LYMPH%: 17.4 % (ref 14.0–48.0)
MCHC: 34.8 g/dL (ref 32.0–36.0)
MONO#: 0.3 10*3/uL (ref 0.1–0.9)
MONO%: 5.7 % (ref 0.0–13.0)
Platelets: 92 10*3/uL — ABNORMAL LOW (ref 145–400)
RBC: 3.39 10*6/uL — ABNORMAL LOW (ref 3.70–5.32)
WBC: 5.7 10*3/uL (ref 3.9–10.0)

## 2008-05-12 LAB — BASIC METABOLIC PANEL
CO2: 22 mEq/L (ref 19–32)
Calcium: 8.7 mg/dL (ref 8.4–10.5)
Sodium: 141 mEq/L (ref 135–145)

## 2008-05-12 LAB — HEPARIN ANTI-XA: Heparin LMW: 0.94 IU/mL

## 2008-07-03 ENCOUNTER — Ambulatory Visit: Payer: Self-pay | Admitting: Oncology

## 2008-07-07 LAB — BASIC METABOLIC PANEL
BUN: 35 mg/dL — ABNORMAL HIGH (ref 6–23)
Chloride: 104 mEq/L (ref 96–112)
Creatinine, Ser: 1.5 mg/dL — ABNORMAL HIGH (ref 0.40–1.20)

## 2008-07-07 LAB — CBC WITH DIFFERENTIAL/PLATELET
Basophils Absolute: 0 10*3/uL (ref 0.0–0.1)
EOS%: 2.7 % (ref 0.0–7.0)
Eosinophils Absolute: 0.1 10*3/uL (ref 0.0–0.5)
HCT: 30.2 % — ABNORMAL LOW (ref 34.8–46.6)
HGB: 10.7 g/dL — ABNORMAL LOW (ref 11.6–15.9)
MCH: 32.3 pg (ref 26.0–34.0)
MONO#: 0.3 10*3/uL (ref 0.1–0.9)
NEUT#: 2 10*3/uL (ref 1.5–6.5)
NEUT%: 64.4 % (ref 39.6–76.8)
RDW: 12.4 % (ref 11.3–14.5)
WBC: 3.2 10*3/uL — ABNORMAL LOW (ref 3.9–10.0)
lymph#: 0.7 10*3/uL — ABNORMAL LOW (ref 0.9–3.3)

## 2008-09-01 ENCOUNTER — Ambulatory Visit: Payer: Self-pay | Admitting: Oncology

## 2008-09-03 LAB — CBC WITH DIFFERENTIAL/PLATELET
BASO%: 0.7 % (ref 0.0–2.0)
Eosinophils Absolute: 0.1 10*3/uL (ref 0.0–0.5)
LYMPH%: 11.4 % — ABNORMAL LOW (ref 14.0–49.7)
MCHC: 35.1 g/dL (ref 31.5–36.0)
MONO#: 0.2 10*3/uL (ref 0.1–0.9)
NEUT#: 4.7 10*3/uL (ref 1.5–6.5)
Platelets: 96 10*3/uL — ABNORMAL LOW (ref 145–400)
RBC: 3.38 10*6/uL — ABNORMAL LOW (ref 3.70–5.45)
WBC: 5.7 10*3/uL (ref 3.9–10.3)
lymph#: 0.6 10*3/uL — ABNORMAL LOW (ref 0.9–3.3)

## 2008-09-03 LAB — HEPARIN ANTI-XA: Heparin LMW: 0.89 IU/mL

## 2008-10-05 ENCOUNTER — Ambulatory Visit: Payer: Self-pay | Admitting: Oncology

## 2008-10-05 LAB — CBC WITH DIFFERENTIAL/PLATELET
Basophils Absolute: 0 10*3/uL (ref 0.0–0.1)
EOS%: 2.3 % (ref 0.0–7.0)
Eosinophils Absolute: 0.1 10*3/uL (ref 0.0–0.5)
HGB: 10.4 g/dL — ABNORMAL LOW (ref 11.6–15.9)
MCH: 32 pg (ref 25.1–34.0)
NEUT#: 2.8 10*3/uL (ref 1.5–6.5)
RBC: 3.25 10*6/uL — ABNORMAL LOW (ref 3.70–5.45)
RDW: 12.7 % (ref 11.2–14.5)
lymph#: 0.7 10*3/uL — ABNORMAL LOW (ref 0.9–3.3)

## 2008-10-05 LAB — HEPARIN ANTI-XA: Heparin LMW: 0.72 IU/mL

## 2008-11-06 LAB — CBC WITH DIFFERENTIAL/PLATELET
BASO%: 0.8 % (ref 0.0–2.0)
Eosinophils Absolute: 0.1 10*3/uL (ref 0.0–0.5)
HCT: 33 % — ABNORMAL LOW (ref 34.8–46.6)
LYMPH%: 23.3 % (ref 14.0–49.7)
MCHC: 34.5 g/dL (ref 31.5–36.0)
MCV: 91.7 fL (ref 79.5–101.0)
MONO#: 0.3 10*3/uL (ref 0.1–0.9)
MONO%: 6.1 % (ref 0.0–14.0)
NEUT%: 67.9 % (ref 38.4–76.8)
Platelets: 103 10*3/uL — ABNORMAL LOW (ref 145–400)
RBC: 3.6 10*6/uL — ABNORMAL LOW (ref 3.70–5.45)

## 2008-11-06 LAB — COMPREHENSIVE METABOLIC PANEL
Alkaline Phosphatase: 98 U/L (ref 39–117)
CO2: 19 mEq/L (ref 19–32)
Creatinine, Ser: 1.65 mg/dL — ABNORMAL HIGH (ref 0.40–1.20)
Glucose, Bld: 101 mg/dL — ABNORMAL HIGH (ref 70–99)
Sodium: 141 mEq/L (ref 135–145)
Total Bilirubin: 0.5 mg/dL (ref 0.3–1.2)
Total Protein: 7.1 g/dL (ref 6.0–8.3)

## 2008-11-06 LAB — HEPARIN ANTI-XA: Heparin LMW: 0.89 IU/mL

## 2008-12-16 ENCOUNTER — Ambulatory Visit: Payer: Self-pay | Admitting: Oncology

## 2008-12-18 LAB — CBC WITH DIFFERENTIAL/PLATELET
Basophils Absolute: 0 10*3/uL (ref 0.0–0.1)
Eosinophils Absolute: 0.1 10*3/uL (ref 0.0–0.5)
HCT: 30.7 % — ABNORMAL LOW (ref 34.8–46.6)
HGB: 10.8 g/dL — ABNORMAL LOW (ref 11.6–15.9)
MCH: 32.4 pg (ref 25.1–34.0)
MONO#: 0.3 10*3/uL (ref 0.1–0.9)
NEUT#: 3.2 10*3/uL (ref 1.5–6.5)
NEUT%: 65.9 % (ref 38.4–76.8)
RDW: 12.5 % (ref 11.2–14.5)
WBC: 4.8 10*3/uL (ref 3.9–10.3)
lymph#: 1.2 10*3/uL (ref 0.9–3.3)

## 2008-12-18 LAB — BASIC METABOLIC PANEL
BUN: 55 mg/dL — ABNORMAL HIGH (ref 6–23)
CO2: 20 mEq/L (ref 19–32)
Chloride: 108 mEq/L (ref 96–112)
Creatinine, Ser: 1.72 mg/dL — ABNORMAL HIGH (ref 0.40–1.20)
Potassium: 4.3 mEq/L (ref 3.5–5.3)

## 2009-02-16 ENCOUNTER — Ambulatory Visit: Payer: Self-pay | Admitting: Oncology

## 2009-02-19 LAB — BASIC METABOLIC PANEL
BUN: 43 mg/dL — ABNORMAL HIGH (ref 6–23)
Chloride: 112 mEq/L (ref 96–112)
Glucose, Bld: 106 mg/dL — ABNORMAL HIGH (ref 70–99)
Potassium: 4.3 mEq/L (ref 3.5–5.3)
Sodium: 143 mEq/L (ref 135–145)

## 2009-02-19 LAB — HEPARIN ANTI-XA: Heparin LMW: 0.57 IU/mL

## 2009-02-19 LAB — CBC WITH DIFFERENTIAL/PLATELET
Basophils Absolute: 0.1 10*3/uL (ref 0.0–0.1)
Eosinophils Absolute: 0.1 10*3/uL (ref 0.0–0.5)
HGB: 9.9 g/dL — ABNORMAL LOW (ref 11.6–15.9)
MCV: 90.5 fL (ref 79.5–101.0)
MONO#: 0.3 10*3/uL (ref 0.1–0.9)
MONO%: 5.5 % (ref 0.0–14.0)
NEUT#: 3.7 10*3/uL (ref 1.5–6.5)
RBC: 3.27 10*6/uL — ABNORMAL LOW (ref 3.70–5.45)
RDW: 12.2 % (ref 11.2–14.5)
WBC: 5.1 10*3/uL (ref 3.9–10.3)
lymph#: 0.9 10*3/uL (ref 0.9–3.3)

## 2009-02-20 ENCOUNTER — Emergency Department (HOSPITAL_COMMUNITY): Admission: EM | Admit: 2009-02-20 | Discharge: 2009-02-21 | Payer: Self-pay | Admitting: Emergency Medicine

## 2009-02-22 ENCOUNTER — Encounter: Admission: RE | Admit: 2009-02-22 | Discharge: 2009-02-22 | Payer: Self-pay | Admitting: Neurology

## 2009-03-09 LAB — BASIC METABOLIC PANEL
BUN: 29 mg/dL — ABNORMAL HIGH (ref 6–23)
CO2: 27 mEq/L (ref 19–32)
Chloride: 110 mEq/L (ref 96–112)
Glucose, Bld: 91 mg/dL (ref 70–99)
Potassium: 3.8 mEq/L (ref 3.5–5.3)

## 2009-03-09 LAB — CBC WITH DIFFERENTIAL/PLATELET
Basophils Absolute: 0.1 10*3/uL (ref 0.0–0.1)
Eosinophils Absolute: 0.2 10*3/uL (ref 0.0–0.5)
HGB: 10.9 g/dL — ABNORMAL LOW (ref 11.6–15.9)
MCV: 90.7 fL (ref 79.5–101.0)
MONO%: 5.9 % (ref 0.0–14.0)
NEUT#: 3.7 10*3/uL (ref 1.5–6.5)
RDW: 12.5 % (ref 11.2–14.5)

## 2009-03-09 LAB — BRAIN NATRIURETIC PEPTIDE: Brain Natriuretic Peptide: 166.1 pg/mL — ABNORMAL HIGH (ref 0.0–100.0)

## 2009-03-09 LAB — HEPARIN ANTI-XA: Heparin LMW: 0.39 IU/mL

## 2009-05-07 ENCOUNTER — Ambulatory Visit: Payer: Self-pay | Admitting: Oncology

## 2009-06-02 ENCOUNTER — Encounter: Payer: Self-pay | Admitting: Emergency Medicine

## 2009-06-03 ENCOUNTER — Encounter (INDEPENDENT_AMBULATORY_CARE_PROVIDER_SITE_OTHER): Payer: Self-pay | Admitting: Cardiology

## 2009-06-03 ENCOUNTER — Inpatient Hospital Stay (HOSPITAL_COMMUNITY): Admission: EM | Admit: 2009-06-03 | Discharge: 2009-06-08 | Payer: Self-pay | Admitting: Interventional Cardiology

## 2009-06-03 ENCOUNTER — Ambulatory Visit: Payer: Self-pay | Admitting: Cardiology

## 2009-06-03 ENCOUNTER — Ambulatory Visit: Payer: Self-pay | Admitting: Oncology

## 2009-06-10 ENCOUNTER — Ambulatory Visit: Payer: Self-pay | Admitting: Oncology

## 2009-06-14 LAB — CBC WITH DIFFERENTIAL/PLATELET
Basophils Absolute: 0 10*3/uL (ref 0.0–0.1)
Eosinophils Absolute: 0.2 10*3/uL (ref 0.0–0.5)
HGB: 9.6 g/dL — ABNORMAL LOW (ref 11.6–15.9)
MONO#: 0.4 10*3/uL (ref 0.1–0.9)
NEUT#: 6 10*3/uL (ref 1.5–6.5)
RBC: 2.96 10*6/uL — ABNORMAL LOW (ref 3.70–5.45)
RDW: 13.6 % (ref 11.2–14.5)
WBC: 7.7 10*3/uL (ref 3.9–10.3)
lymph#: 1.1 10*3/uL (ref 0.9–3.3)

## 2009-06-14 LAB — MORPHOLOGY: PLT EST: DECREASED

## 2009-06-14 LAB — BASIC METABOLIC PANEL
Calcium: 8.7 mg/dL (ref 8.4–10.5)
Glucose, Bld: 83 mg/dL (ref 70–99)
Sodium: 139 mEq/L (ref 135–145)

## 2009-06-16 LAB — BASIC METABOLIC PANEL
CO2: 21 mEq/L (ref 19–32)
Calcium: 8.7 mg/dL (ref 8.4–10.5)
Chloride: 113 mEq/L — ABNORMAL HIGH (ref 96–112)
Sodium: 141 mEq/L (ref 135–145)

## 2009-06-28 ENCOUNTER — Telehealth: Payer: Self-pay | Admitting: Internal Medicine

## 2009-06-28 LAB — CBC WITH DIFFERENTIAL/PLATELET
Basophils Absolute: 0 10*3/uL (ref 0.0–0.1)
Eosinophils Absolute: 0.2 10*3/uL (ref 0.0–0.5)
HCT: 28.9 % — ABNORMAL LOW (ref 34.8–46.6)
HGB: 10 g/dL — ABNORMAL LOW (ref 11.6–15.9)
LYMPH%: 15.7 % (ref 14.0–49.7)
MCV: 93.9 fL (ref 79.5–101.0)
MONO#: 0.3 10*3/uL (ref 0.1–0.9)
MONO%: 7.6 % (ref 0.0–14.0)
NEUT#: 3.2 10*3/uL (ref 1.5–6.5)
NEUT%: 72.1 % (ref 38.4–76.8)
Platelets: 84 10*3/uL — ABNORMAL LOW (ref 145–400)
WBC: 4.4 10*3/uL (ref 3.9–10.3)

## 2009-06-28 LAB — COMPREHENSIVE METABOLIC PANEL
BUN: 39 mg/dL — ABNORMAL HIGH (ref 6–23)
CO2: 22 mEq/L (ref 19–32)
Glucose, Bld: 103 mg/dL — ABNORMAL HIGH (ref 70–99)
Sodium: 140 mEq/L (ref 135–145)
Total Bilirubin: 0.7 mg/dL (ref 0.3–1.2)
Total Protein: 6.7 g/dL (ref 6.0–8.3)

## 2009-07-02 ENCOUNTER — Ambulatory Visit: Payer: Self-pay | Admitting: Internal Medicine

## 2009-07-02 DIAGNOSIS — I4892 Unspecified atrial flutter: Secondary | ICD-10-CM | POA: Insufficient documentation

## 2009-07-02 DIAGNOSIS — I5043 Acute on chronic combined systolic (congestive) and diastolic (congestive) heart failure: Secondary | ICD-10-CM | POA: Insufficient documentation

## 2009-07-02 DIAGNOSIS — N184 Chronic kidney disease, stage 4 (severe): Secondary | ICD-10-CM | POA: Insufficient documentation

## 2009-07-07 ENCOUNTER — Telehealth: Payer: Self-pay | Admitting: Internal Medicine

## 2009-07-08 ENCOUNTER — Encounter: Payer: Self-pay | Admitting: Internal Medicine

## 2009-07-13 ENCOUNTER — Telehealth: Payer: Self-pay | Admitting: Internal Medicine

## 2009-07-14 ENCOUNTER — Ambulatory Visit: Payer: Self-pay | Admitting: Internal Medicine

## 2009-07-15 ENCOUNTER — Encounter: Admission: RE | Admit: 2009-07-15 | Discharge: 2009-07-15 | Payer: Self-pay | Admitting: Internal Medicine

## 2009-07-16 ENCOUNTER — Ambulatory Visit: Payer: Self-pay | Admitting: Oncology

## 2009-07-16 ENCOUNTER — Telehealth: Payer: Self-pay | Admitting: Internal Medicine

## 2009-07-20 ENCOUNTER — Encounter: Payer: Self-pay | Admitting: Cardiology

## 2009-07-20 LAB — CBC WITH DIFFERENTIAL/PLATELET
Basophils Absolute: 0 10*3/uL (ref 0.0–0.1)
Eosinophils Absolute: 0.2 10*3/uL (ref 0.0–0.5)
HGB: 10.6 g/dL — ABNORMAL LOW (ref 11.6–15.9)
LYMPH%: 19.6 % (ref 14.0–49.7)
MONO#: 0.3 10*3/uL (ref 0.1–0.9)
NEUT#: 3.3 10*3/uL (ref 1.5–6.5)
Platelets: 114 10*3/uL — ABNORMAL LOW (ref 145–400)
RBC: 3.24 10*6/uL — ABNORMAL LOW (ref 3.70–5.45)
RDW: 13.2 % (ref 11.2–14.5)
WBC: 4.9 10*3/uL (ref 3.9–10.3)

## 2009-07-20 LAB — BASIC METABOLIC PANEL
CO2: 21 mEq/L (ref 19–32)
Glucose, Bld: 108 mg/dL — ABNORMAL HIGH (ref 70–99)
Potassium: 3.8 mEq/L (ref 3.5–5.3)
Sodium: 141 mEq/L (ref 135–145)

## 2009-07-22 ENCOUNTER — Telehealth: Payer: Self-pay | Admitting: Internal Medicine

## 2009-07-26 ENCOUNTER — Ambulatory Visit: Payer: Self-pay | Admitting: Cardiology

## 2009-07-26 DIAGNOSIS — Z9889 Other specified postprocedural states: Secondary | ICD-10-CM

## 2009-07-26 DIAGNOSIS — D696 Thrombocytopenia, unspecified: Secondary | ICD-10-CM | POA: Insufficient documentation

## 2009-07-26 DIAGNOSIS — T50905A Adverse effect of unspecified drugs, medicaments and biological substances, initial encounter: Secondary | ICD-10-CM

## 2009-07-26 DIAGNOSIS — I10 Essential (primary) hypertension: Secondary | ICD-10-CM | POA: Insufficient documentation

## 2009-07-26 DIAGNOSIS — I6200 Nontraumatic subdural hemorrhage, unspecified: Secondary | ICD-10-CM | POA: Insufficient documentation

## 2009-07-26 DIAGNOSIS — R042 Hemoptysis: Secondary | ICD-10-CM | POA: Insufficient documentation

## 2009-07-26 DIAGNOSIS — D6959 Other secondary thrombocytopenia: Secondary | ICD-10-CM

## 2009-07-26 DIAGNOSIS — R0602 Shortness of breath: Secondary | ICD-10-CM | POA: Insufficient documentation

## 2009-07-26 DIAGNOSIS — R42 Dizziness and giddiness: Secondary | ICD-10-CM

## 2009-07-28 LAB — CONVERTED CEMR LAB: Pro B Natriuretic peptide (BNP): 388 pg/mL — ABNORMAL HIGH (ref 0.0–100.0)

## 2009-07-29 ENCOUNTER — Telehealth: Payer: Self-pay | Admitting: Cardiology

## 2009-08-05 ENCOUNTER — Ambulatory Visit: Payer: Self-pay | Admitting: Cardiology

## 2009-08-10 LAB — CONVERTED CEMR LAB
CO2: 32 meq/L (ref 19–32)
Chloride: 97 meq/L (ref 96–112)
Creatinine, Ser: 2 mg/dL — ABNORMAL HIGH (ref 0.4–1.2)
Potassium: 3.5 meq/L (ref 3.5–5.1)
Pro B Natriuretic peptide (BNP): 193 pg/mL — ABNORMAL HIGH (ref 0.0–100.0)

## 2009-08-31 ENCOUNTER — Ambulatory Visit: Payer: Self-pay | Admitting: Oncology

## 2009-09-01 ENCOUNTER — Ambulatory Visit: Payer: Self-pay | Admitting: Internal Medicine

## 2009-09-03 ENCOUNTER — Encounter: Payer: Self-pay | Admitting: Internal Medicine

## 2009-09-03 ENCOUNTER — Encounter: Payer: Self-pay | Admitting: Cardiology

## 2009-09-03 LAB — BASIC METABOLIC PANEL
BUN: 27 mg/dL — ABNORMAL HIGH (ref 6–23)
Calcium: 8.7 mg/dL (ref 8.4–10.5)
Creatinine, Ser: 2.15 mg/dL — ABNORMAL HIGH (ref 0.40–1.20)

## 2009-09-03 LAB — CBC WITH DIFFERENTIAL/PLATELET
BASO%: 0.9 % (ref 0.0–2.0)
EOS%: 2 % (ref 0.0–7.0)
HCT: 33.2 % — ABNORMAL LOW (ref 34.8–46.6)
LYMPH%: 15.8 % (ref 14.0–49.7)
MCH: 31.8 pg (ref 25.1–34.0)
MCHC: 34 g/dL (ref 31.5–36.0)
MCV: 93.5 fL (ref 79.5–101.0)
NEUT%: 76.5 % (ref 38.4–76.8)
Platelets: 115 10*3/uL — ABNORMAL LOW (ref 145–400)

## 2009-09-03 LAB — MAGNESIUM: Magnesium: 2.2 mg/dL (ref 1.5–2.5)

## 2009-09-08 ENCOUNTER — Telehealth: Payer: Self-pay | Admitting: Internal Medicine

## 2009-09-09 ENCOUNTER — Telehealth: Payer: Self-pay | Admitting: Internal Medicine

## 2009-09-15 ENCOUNTER — Telehealth: Payer: Self-pay | Admitting: Internal Medicine

## 2009-09-20 ENCOUNTER — Encounter: Payer: Self-pay | Admitting: Internal Medicine

## 2009-09-21 ENCOUNTER — Telehealth: Payer: Self-pay | Admitting: Internal Medicine

## 2009-09-21 ENCOUNTER — Ambulatory Visit: Payer: Self-pay | Admitting: Cardiology

## 2009-09-21 ENCOUNTER — Inpatient Hospital Stay (HOSPITAL_COMMUNITY): Admission: EM | Admit: 2009-09-21 | Discharge: 2009-09-24 | Payer: Self-pay | Admitting: Emergency Medicine

## 2009-09-22 ENCOUNTER — Ambulatory Visit: Payer: Self-pay | Admitting: Oncology

## 2009-09-23 ENCOUNTER — Encounter: Payer: Self-pay | Admitting: Internal Medicine

## 2009-09-30 ENCOUNTER — Ambulatory Visit: Payer: Self-pay | Admitting: Cardiology

## 2009-09-30 ENCOUNTER — Inpatient Hospital Stay (HOSPITAL_COMMUNITY): Admission: EM | Admit: 2009-09-30 | Discharge: 2009-10-07 | Payer: Self-pay | Admitting: Emergency Medicine

## 2009-09-30 ENCOUNTER — Encounter: Payer: Self-pay | Admitting: Cardiology

## 2009-10-18 ENCOUNTER — Telehealth: Payer: Self-pay | Admitting: Internal Medicine

## 2009-11-01 ENCOUNTER — Ambulatory Visit: Payer: Self-pay | Admitting: Internal Medicine

## 2009-11-01 DIAGNOSIS — Q898 Other specified congenital malformations: Secondary | ICD-10-CM

## 2009-12-01 ENCOUNTER — Ambulatory Visit: Payer: Self-pay | Admitting: Oncology

## 2009-12-01 IMAGING — CT CT HEAD W/O CM
1 series · 16 of 30 positions shown, 20 images · non-contrast
Comparison: [DATE] and 07/24/07.

CLINICAL DATA: Cerebral hemorrhage. 
 HEAD CT WITHOUT CONTRAST ? 07/29/07:
TECHNIQUE: Contiguous axial images were obtained from the base of the skull through the vertex according to standard protocol without contrast.

[Series 2: brain · axial · 0.47mm/px · z∈[+123,+273]mm · 16 of 32 slices shown, 20 images]
[im 2/32  brain]
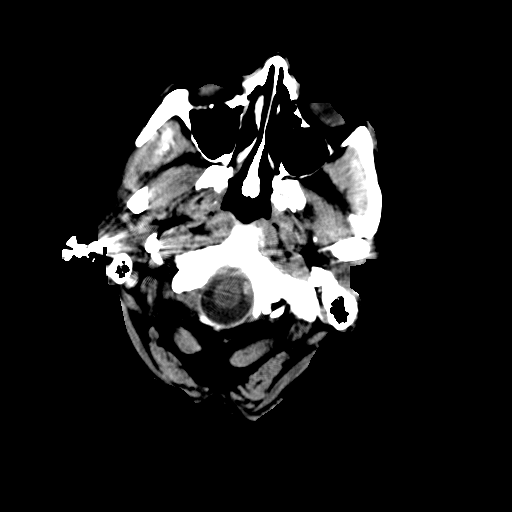
[im 2/32  bone]
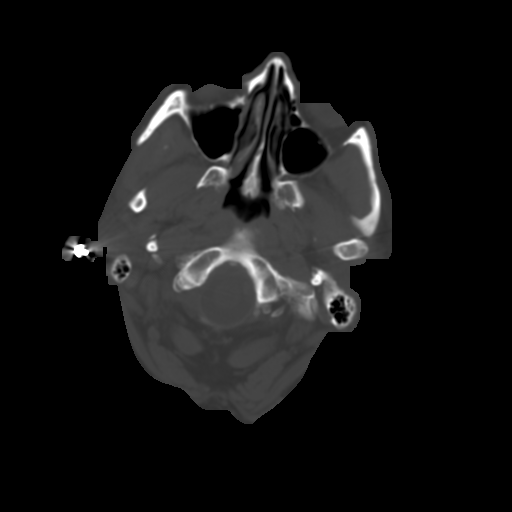
[im 4/32  brain]
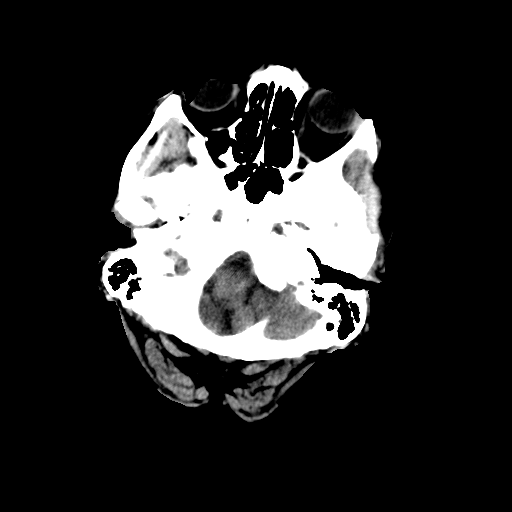
[im 6/32  brain]
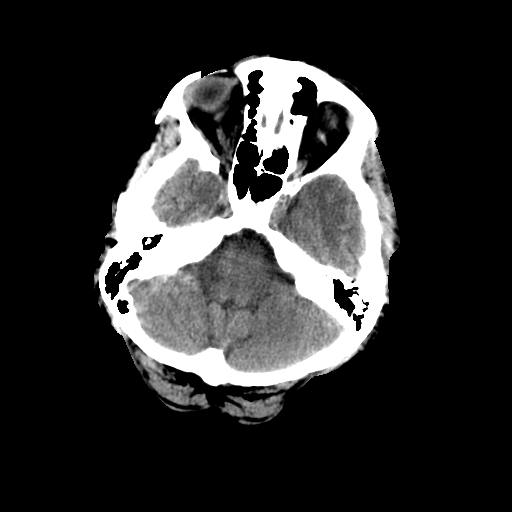
[im 8/32  brain]
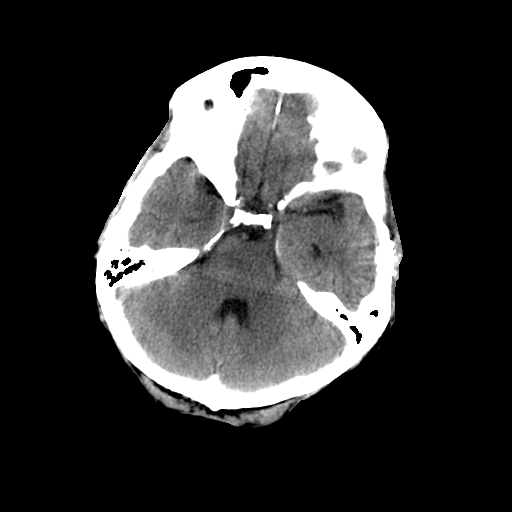
[im 9/32  brain]
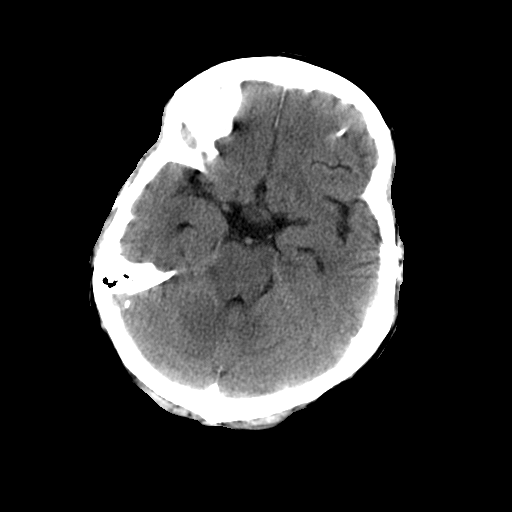
[im 9/32  bone]
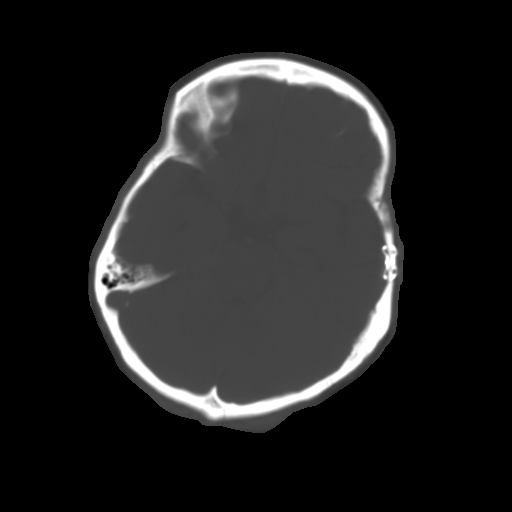
[im 11/32  brain]
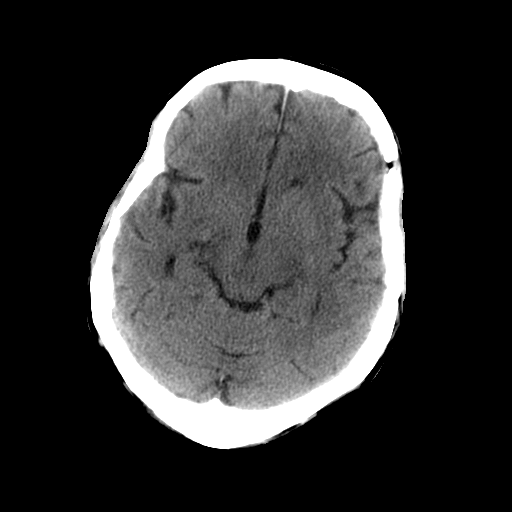
[im 13/32  brain]
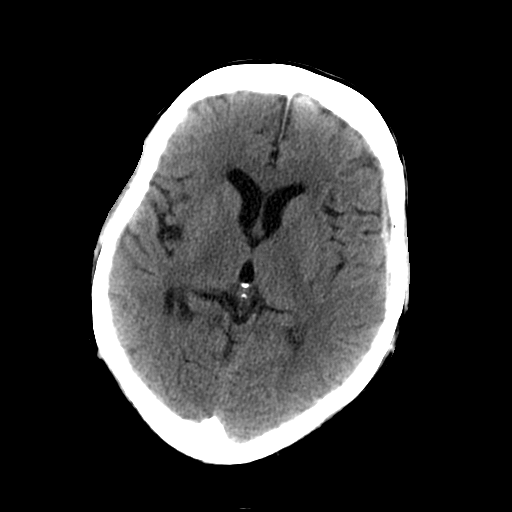
[im 15/32  brain]
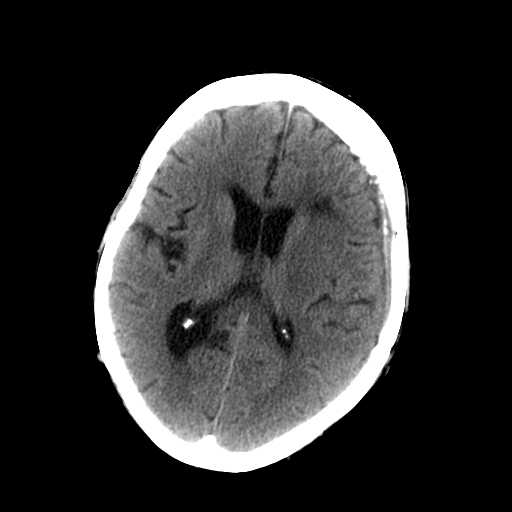
[im 17/32  brain]
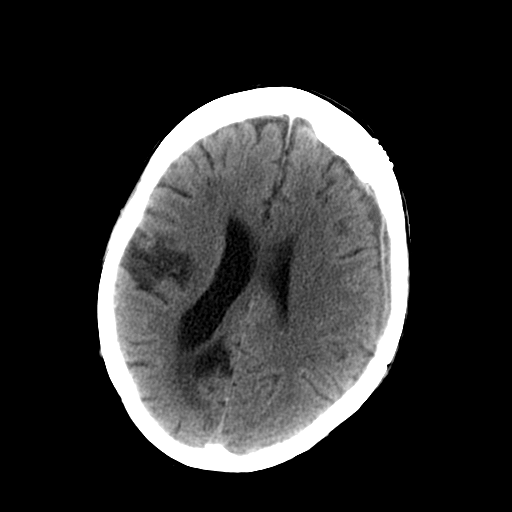
[im 17/32  bone]
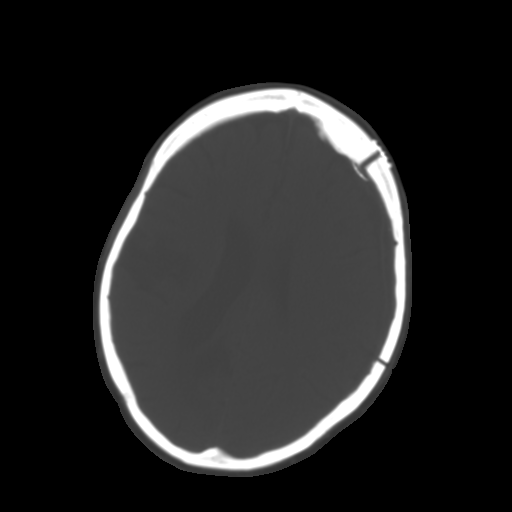
[im 19/32  brain]
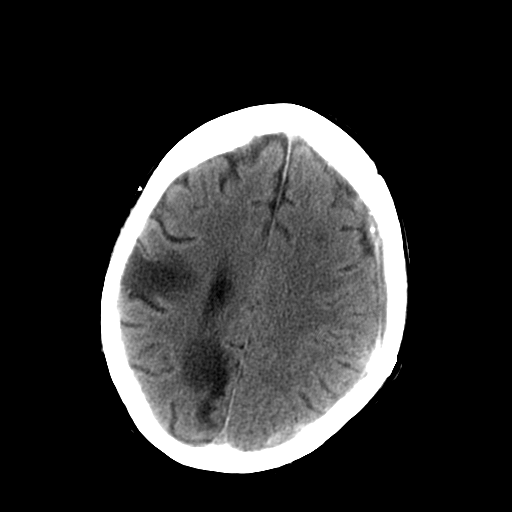
[im 21/32  brain]
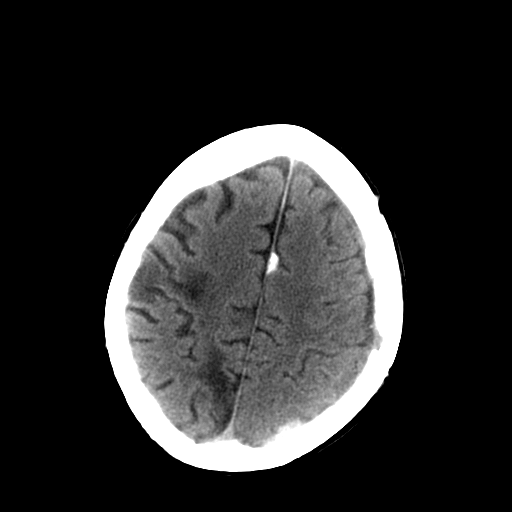
[im 23/32  brain]
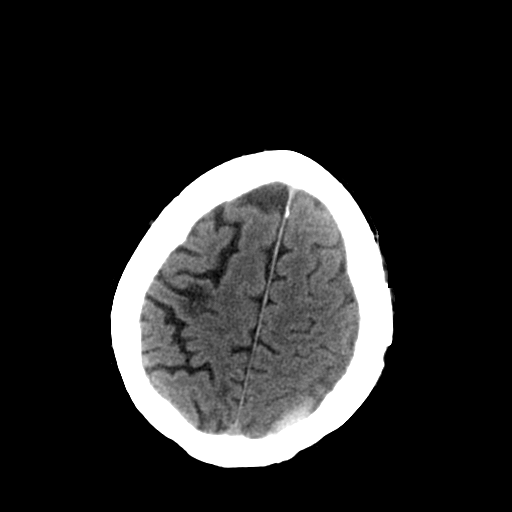
[im 24/32  brain]
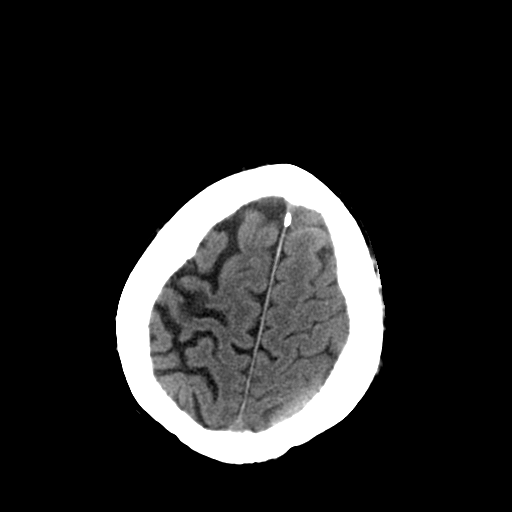
[im 24/32  bone]
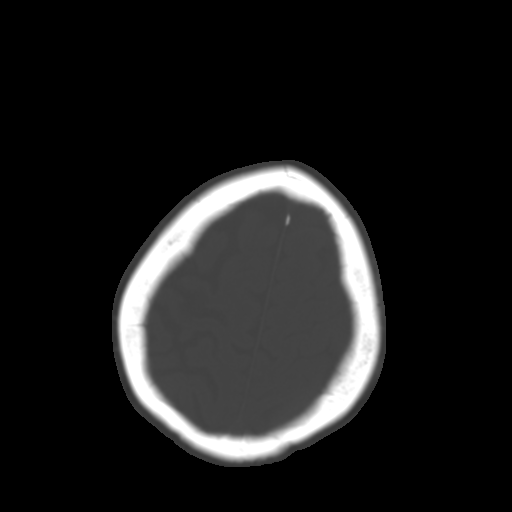
[im 26/32  brain]
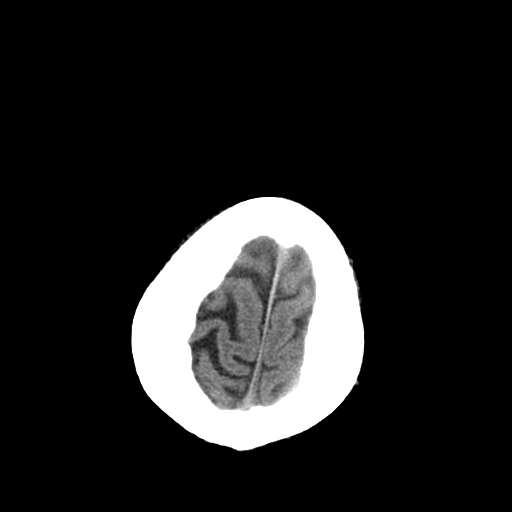
[im 28/32  brain]
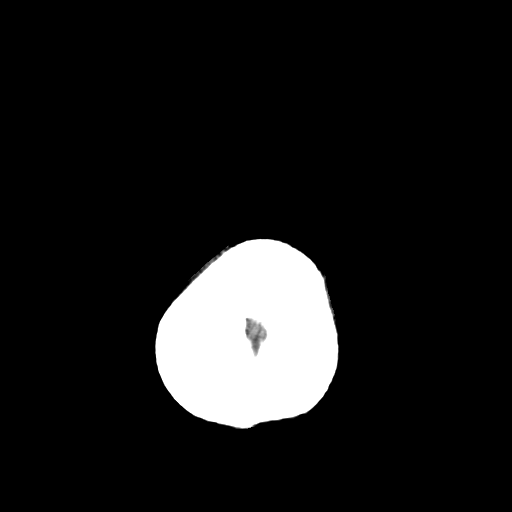
[im 30/32  brain]
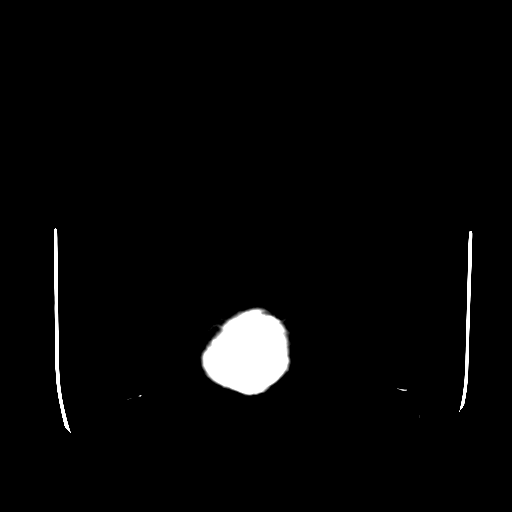

[16 of 30 positions shown; findings below may reference images not displayed]

FINDINGS: Extra-axial fluid collection along the left convexities persists and demonstrates some decrease in size since the patient?s 07/24/07 study.  No midline shift.  No new hemorrhage or hydrocephalus.  The patient has multiple bilateral infarcts which are again noted.  Postoperative change of left-sided craniotomy again noted.
IMPRESSION: Decrease in left-sided subdural hematoma since the study of 07/24/07 without marked change since the examination yesterday.  No new abnormality,

## 2009-12-03 ENCOUNTER — Encounter: Payer: Self-pay | Admitting: Internal Medicine

## 2009-12-03 LAB — COMPREHENSIVE METABOLIC PANEL
ALT: 14 U/L (ref 0–35)
AST: 20 U/L (ref 0–37)
Albumin: 4.1 g/dL (ref 3.5–5.2)
Alkaline Phosphatase: 84 U/L (ref 39–117)
Glucose, Bld: 105 mg/dL — ABNORMAL HIGH (ref 70–99)
Potassium: 3.8 mEq/L (ref 3.5–5.3)
Sodium: 136 mEq/L (ref 135–145)
Total Protein: 6.8 g/dL (ref 6.0–8.3)

## 2009-12-03 LAB — CBC WITH DIFFERENTIAL/PLATELET
Eosinophils Absolute: 0.2 10*3/uL (ref 0.0–0.5)
HCT: 31.6 % — ABNORMAL LOW (ref 34.8–46.6)
LYMPH%: 19.3 % (ref 14.0–49.7)
MONO#: 0.3 10*3/uL (ref 0.1–0.9)
NEUT#: 3.5 10*3/uL (ref 1.5–6.5)
Platelets: 128 10*3/uL — ABNORMAL LOW (ref 145–400)
RBC: 3.47 10*6/uL — ABNORMAL LOW (ref 3.70–5.45)
WBC: 5.1 10*3/uL (ref 3.9–10.3)

## 2009-12-03 LAB — MORPHOLOGY: PLT EST: DECREASED

## 2009-12-03 IMAGING — CT CT HEAD W/O CM
1 series · 16 of 30 positions shown, 20 images · IV contrast (agent unspecified)
Comparison: 07/29/07 and 07/24/07.

CLINICAL DATA: Status post craniotomy.  Subdural hemorrhage.
 HEAD CT WITHOUT CONTRAST:
TECHNIQUE: Contiguous axial images were obtained from the base of the skull through the vertex according to standard protocol without contrast.

[Series 2: head routine 4.8 h47s · axial · 0.44mm/px · z∈[-119,+16]mm · 16 of 30 slices shown, 20 images]
[im 2/30  brain]
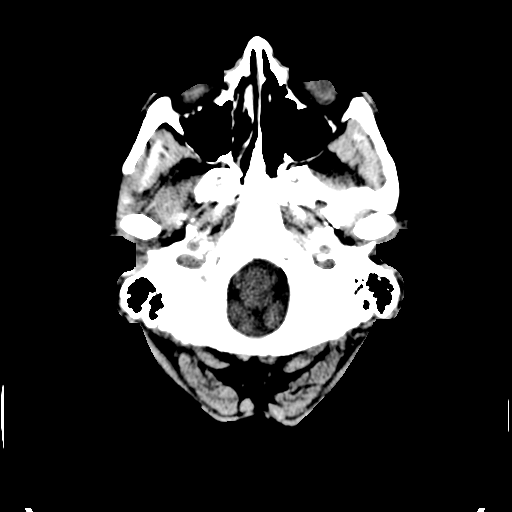
[im 2/30  bone]
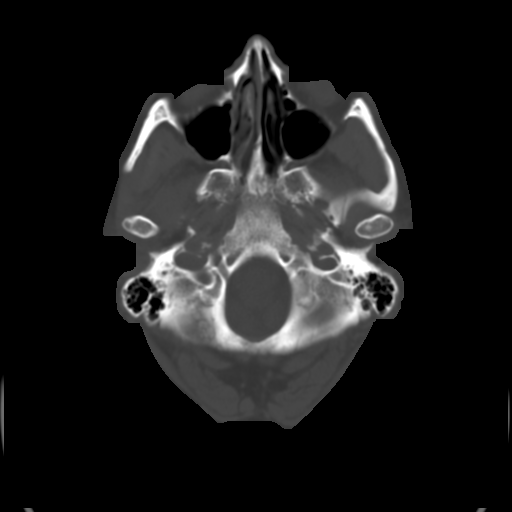
[im 4/30  brain]
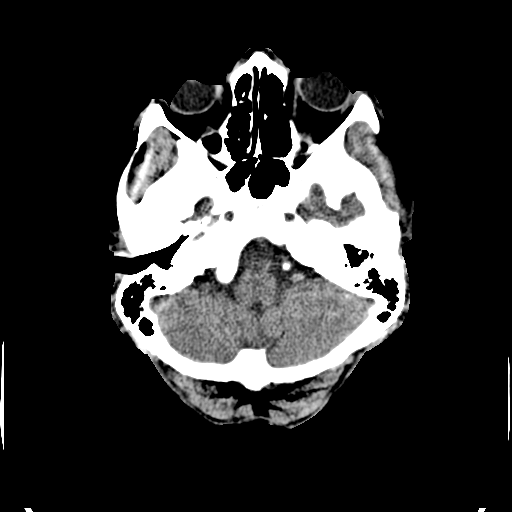
[im 6/30  brain]
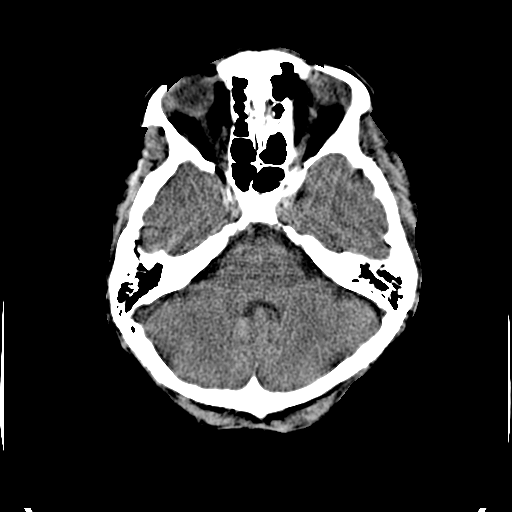
[im 8/30  brain]
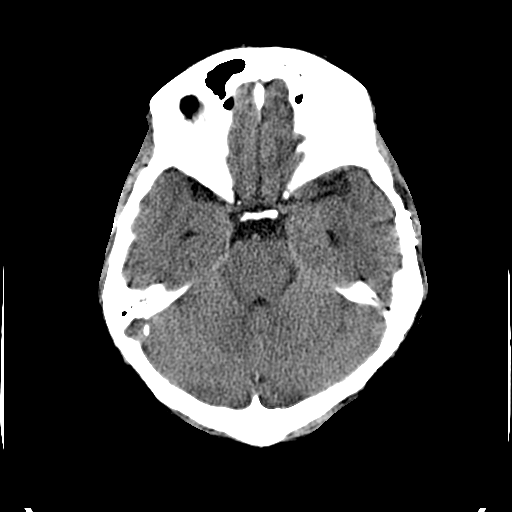
[im 9/30  brain]
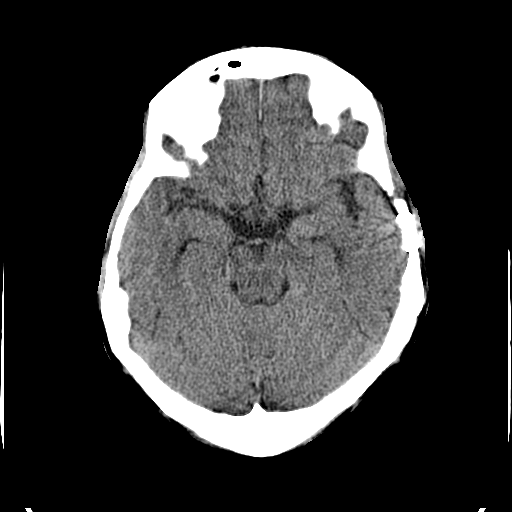
[im 9/30  bone]
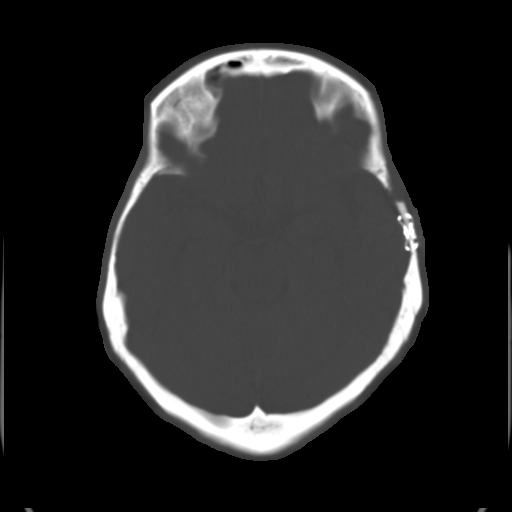
[im 11/30  brain]
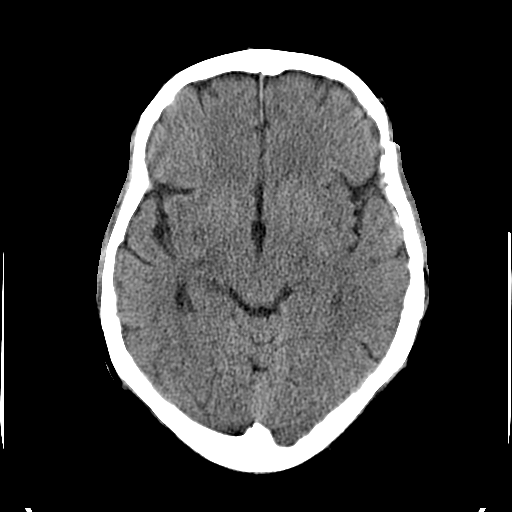
[im 13/30  brain]
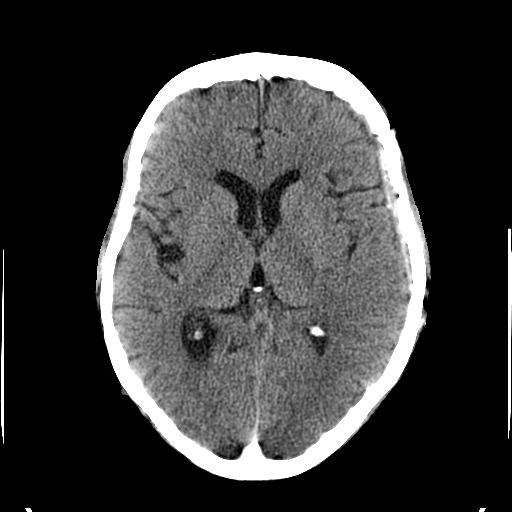
[im 15/30  brain]
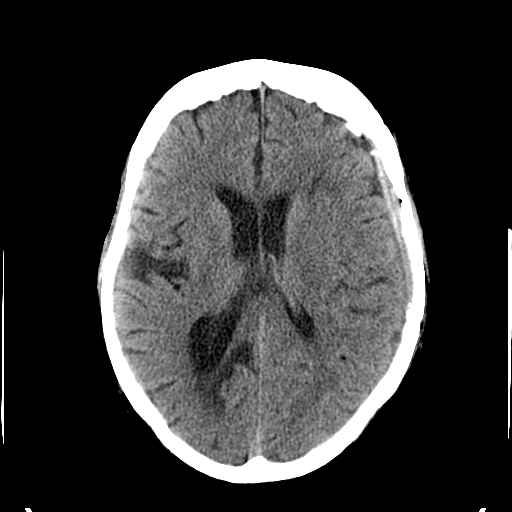
[im 16/30  brain]
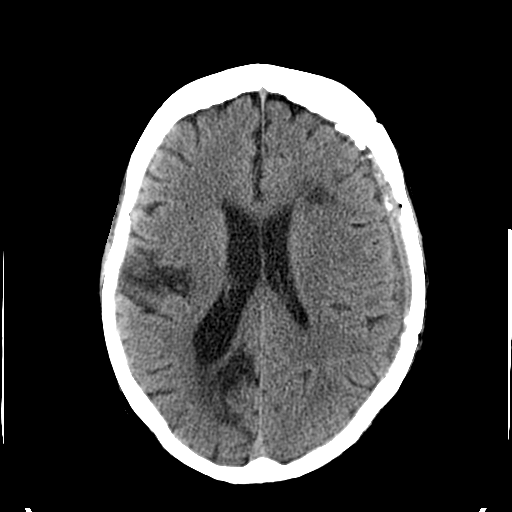
[im 16/30  bone]
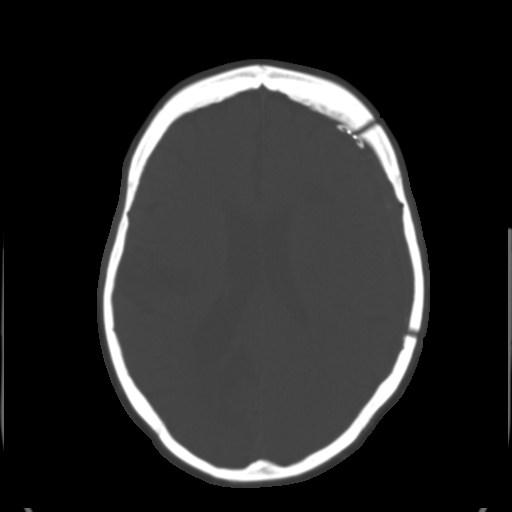
[im 18/30  brain]
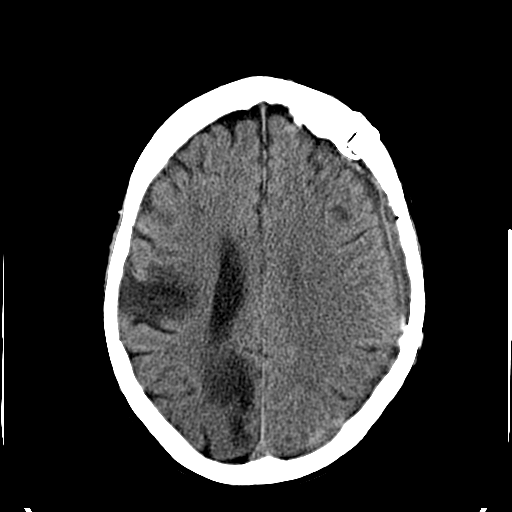
[im 20/30  brain]
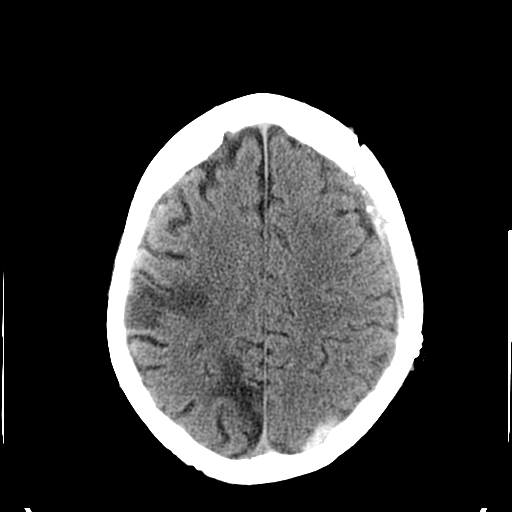
[im 22/30  brain]
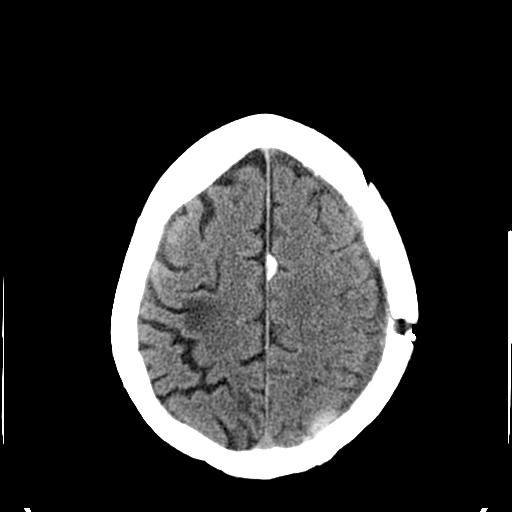
[im 23/30  brain]
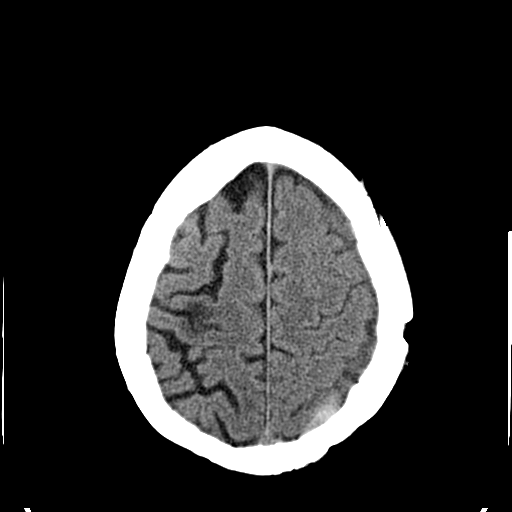
[im 23/30  bone]
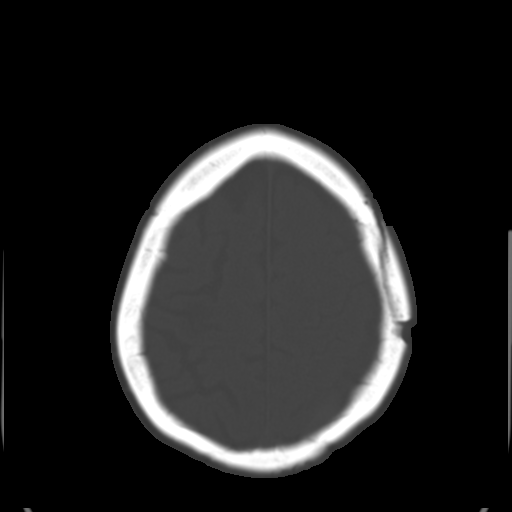
[im 25/30  brain]
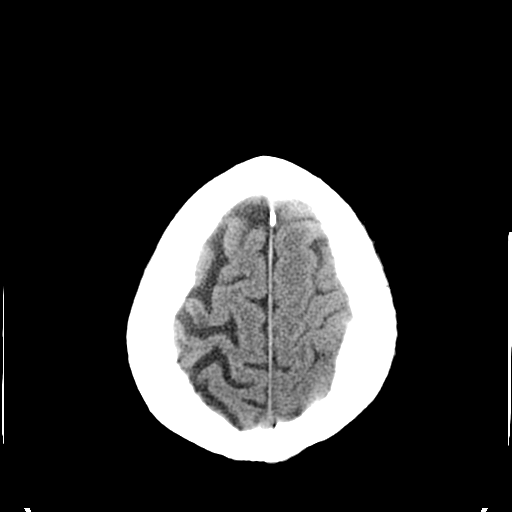
[im 27/30  brain]
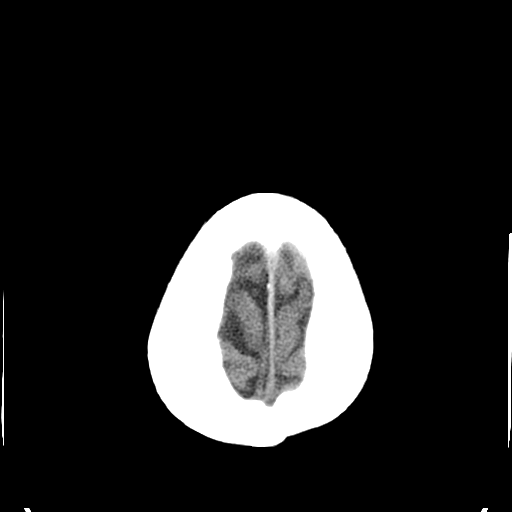
[im 29/30  brain]
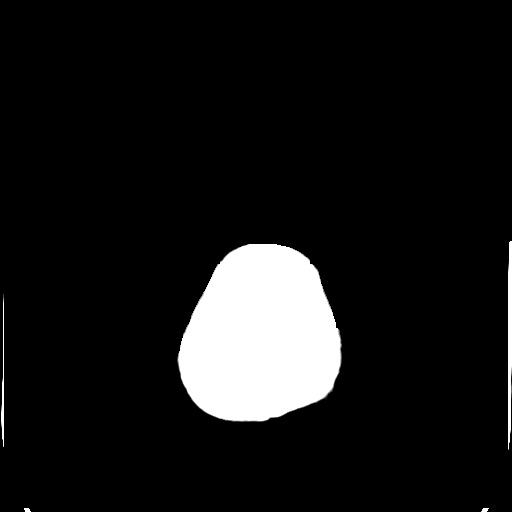

[16 of 30 positions shown; findings below may reference images not displayed]

FINDINGS: Postoperative change of left-sided craniotomy is noted.  There is a thin extraaxial fluid collection subjacent to the craniotomy site noted.  Small high attenuation collection along the high left parietal convexities is again seen and demonstrates expected evolutional change, appearing smaller with decreased density.  No new hemorrhage.  
 Remote right occipital and temporoparietal infarcts are again noted.  Infarct in the deep white matter of the left frontal lobe also again seen.  No new infarct.  No hydrocephalus.
IMPRESSION: 1.  Continued evolution of left-sided subdural hematoma.  No new bleed or new abnormality.
 2.  Multiple bilateral infarcts.

## 2009-12-14 ENCOUNTER — Encounter: Payer: Self-pay | Admitting: Internal Medicine

## 2009-12-16 ENCOUNTER — Ambulatory Visit: Payer: Self-pay | Admitting: Internal Medicine

## 2009-12-23 ENCOUNTER — Encounter: Payer: Self-pay | Admitting: Internal Medicine

## 2010-01-21 ENCOUNTER — Telehealth: Payer: Self-pay | Admitting: Internal Medicine

## 2010-03-01 ENCOUNTER — Telehealth: Payer: Self-pay | Admitting: Internal Medicine

## 2010-03-02 ENCOUNTER — Ambulatory Visit: Payer: Self-pay | Admitting: Oncology

## 2010-03-07 ENCOUNTER — Encounter: Payer: Self-pay | Admitting: Internal Medicine

## 2010-03-07 LAB — CBC WITH DIFFERENTIAL/PLATELET
Basophils Absolute: 0.1 10*3/uL (ref 0.0–0.1)
EOS%: 4.2 % (ref 0.0–7.0)
HCT: 32.3 % — ABNORMAL LOW (ref 34.8–46.6)
HGB: 10.8 g/dL — ABNORMAL LOW (ref 11.6–15.9)
LYMPH%: 23.1 % (ref 14.0–49.7)
MCH: 30.3 pg (ref 25.1–34.0)
MCV: 90.5 fL (ref 79.5–101.0)
MONO%: 8.7 % (ref 0.0–14.0)
NEUT%: 62.4 % (ref 38.4–76.8)

## 2010-03-07 LAB — COMPREHENSIVE METABOLIC PANEL
AST: 17 U/L (ref 0–37)
Alkaline Phosphatase: 65 U/L (ref 39–117)
BUN: 44 mg/dL — ABNORMAL HIGH (ref 6–23)
Calcium: 8.5 mg/dL (ref 8.4–10.5)
Creatinine, Ser: 1.65 mg/dL — ABNORMAL HIGH (ref 0.40–1.20)

## 2010-03-18 ENCOUNTER — Encounter (INDEPENDENT_AMBULATORY_CARE_PROVIDER_SITE_OTHER): Payer: Self-pay | Admitting: *Deleted

## 2010-04-12 ENCOUNTER — Encounter: Payer: Self-pay | Admitting: Internal Medicine

## 2010-04-18 ENCOUNTER — Ambulatory Visit: Payer: Self-pay | Admitting: Internal Medicine

## 2010-04-21 ENCOUNTER — Ambulatory Visit (HOSPITAL_COMMUNITY): Admission: RE | Admit: 2010-04-21 | Discharge: 2010-04-21 | Payer: Self-pay | Admitting: Internal Medicine

## 2010-04-21 ENCOUNTER — Ambulatory Visit: Payer: Self-pay

## 2010-04-21 ENCOUNTER — Ambulatory Visit: Payer: Self-pay | Admitting: Cardiology

## 2010-04-21 ENCOUNTER — Encounter: Payer: Self-pay | Admitting: Internal Medicine

## 2010-05-05 ENCOUNTER — Ambulatory Visit: Payer: Self-pay | Admitting: Oncology

## 2010-05-09 ENCOUNTER — Encounter: Payer: Self-pay | Admitting: Internal Medicine

## 2010-05-09 LAB — CBC & DIFF AND RETIC
Eosinophils Absolute: 0.1 10*3/uL (ref 0.0–0.5)
HCT: 33.7 % — ABNORMAL LOW (ref 34.8–46.6)
LYMPH%: 17.5 % (ref 14.0–49.7)
MCHC: 32.9 g/dL (ref 31.5–36.0)
MCV: 93.9 fL (ref 79.5–101.0)
MONO%: 6.8 % (ref 0.0–14.0)
NEUT#: 2.7 10*3/uL (ref 1.5–6.5)
NEUT%: 71 % (ref 38.4–76.8)
Platelets: 105 10*3/uL — ABNORMAL LOW (ref 145–400)
RBC: 3.59 10*6/uL — ABNORMAL LOW (ref 3.70–5.45)
Retic %: 0.42 % — ABNORMAL LOW (ref 0.50–1.50)

## 2010-05-09 LAB — BASIC METABOLIC PANEL
BUN: 41 mg/dL — ABNORMAL HIGH (ref 6–23)
CO2: 25 mEq/L (ref 19–32)
Chloride: 107 mEq/L (ref 96–112)
Creatinine, Ser: 1.56 mg/dL — ABNORMAL HIGH (ref 0.40–1.20)

## 2010-05-09 LAB — MORPHOLOGY

## 2010-06-03 ENCOUNTER — Encounter: Payer: Self-pay | Admitting: Internal Medicine

## 2010-06-08 ENCOUNTER — Encounter: Payer: Self-pay | Admitting: Internal Medicine

## 2010-06-26 HISTORY — PX: CARDIAC ELECTROPHYSIOLOGY MAPPING AND ABLATION: SHX1292

## 2010-06-30 ENCOUNTER — Ambulatory Visit: Payer: Self-pay | Admitting: Oncology

## 2010-07-04 ENCOUNTER — Encounter: Payer: Self-pay | Admitting: Internal Medicine

## 2010-07-04 LAB — COMPREHENSIVE METABOLIC PANEL
ALT: 20 U/L (ref 0–35)
AST: 24 U/L (ref 0–37)
Albumin: 3.9 g/dL (ref 3.5–5.2)
Alkaline Phosphatase: 63 U/L (ref 39–117)
BUN: 35 mg/dL — ABNORMAL HIGH (ref 6–23)
CO2: 27 mEq/L (ref 19–32)
Calcium: 9.1 mg/dL (ref 8.4–10.5)
Chloride: 105 mEq/L (ref 96–112)
Creatinine, Ser: 1.75 mg/dL — ABNORMAL HIGH (ref 0.40–1.20)
Glucose, Bld: 79 mg/dL (ref 70–99)
Potassium: 4.1 mEq/L (ref 3.5–5.3)
Sodium: 141 mEq/L (ref 135–145)
Total Bilirubin: 0.6 mg/dL (ref 0.3–1.2)
Total Protein: 7.7 g/dL (ref 6.0–8.3)

## 2010-07-04 LAB — CBC WITH DIFFERENTIAL/PLATELET
BASO%: 0.7 % (ref 0.0–2.0)
Basophils Absolute: 0 10*3/uL (ref 0.0–0.1)
EOS%: 2.7 % (ref 0.0–7.0)
Eosinophils Absolute: 0.2 10*3/uL (ref 0.0–0.5)
HCT: 32.6 % — ABNORMAL LOW (ref 34.8–46.6)
HGB: 11.2 g/dL — ABNORMAL LOW (ref 11.6–15.9)
LYMPH%: 19.2 % (ref 14.0–49.7)
MCH: 32 pg (ref 25.1–34.0)
MCHC: 34.3 g/dL (ref 31.5–36.0)
MCV: 93.3 fL (ref 79.5–101.0)
MONO#: 0.4 10*3/uL (ref 0.1–0.9)
MONO%: 6.7 % (ref 0.0–14.0)
NEUT#: 3.9 10*3/uL (ref 1.5–6.5)
NEUT%: 70.7 % (ref 38.4–76.8)
Platelets: 116 10*3/uL — ABNORMAL LOW (ref 145–400)
RBC: 3.49 10*6/uL — ABNORMAL LOW (ref 3.70–5.45)
RDW: 12.8 % (ref 11.2–14.5)
WBC: 5.6 10*3/uL (ref 3.9–10.3)
lymph#: 1.1 10*3/uL (ref 0.9–3.3)

## 2010-07-04 LAB — MORPHOLOGY
PLT EST: DECREASED
RBC Comments: NORMAL

## 2010-07-04 LAB — HEPARIN ANTI-XA: Heparin LMW: 0.97 IU/mL

## 2010-07-04 LAB — LACTATE DEHYDROGENASE: LDH: 228 U/L (ref 94–250)

## 2010-07-14 ENCOUNTER — Encounter: Payer: Self-pay | Admitting: Internal Medicine

## 2010-07-24 LAB — CONVERTED CEMR LAB
CO2: 27 meq/L (ref 19–32)
Chloride: 106 meq/L (ref 96–112)
Glucose, Bld: 96 mg/dL (ref 70–99)
Sodium: 142 meq/L (ref 135–145)

## 2010-07-28 NOTE — Progress Notes (Signed)
Summary: refill  Phone Note Refill Request Message from:  Patient on July 29, 2009 10:31 AM  Dr.Wenrich would like her Lasix called in to [facility] [phone]  Initial call taken by: Judie Grieve,  July 29, 2009 10:32 AM    Prescriptions: FUROSEMIDE 20 MG TABS (FUROSEMIDE) Take one tablet by mouth daily.  #30 x 12   Entered by:   Kem Parkinson   Authorized by:   Ferman Hamming, MD, [facility]   Signed by:   Kem Parkinson on 07/29/2009   Method used:   Faxed to ...       [facility] (retail)       [address] [place]       [address]       [place], [place]  [address]       Ph: [phone]       Fax: [phone]   RxID:   [id number]

## 2010-07-28 NOTE — Letter (Signed)
Summary: [facility]  [facility]   Imported By: Marylou Mccoy 03/30/2010 10:38:39  _____________________________________________________________________  External Attachment:    Type:   Image     Comment:   External Document

## 2010-07-28 NOTE — Assessment & Plan Note (Signed)
Summary: eph   Visit Type:  Follow-up  CC:  atrial tachycardia.  History of Present Illness: The patient presents today for routine electrophysiology followup. She appears to be maintaining sinus rhythm.  She reports occasional palpitations but no sustained symptoms of tachycardia.  She reports having worsening BLE edema off of HCTZ and therefore restarted this medicine.  She has been following closely with Dr Cyndie Chime for renal failure and has a creatinine clearance planned for Monday.  She feels that her appetite is low.  He also reports fatigue with moderate activity. The patient denies symptoms of  chest pain, shortness of breath, orthopnea, PND,  dizziness, presyncope, syncope, or neurologic sequela. The patient is otherwise without complaint today.    Current Medications (verified): 1)  Multaq 400 Mg Tabs (Dronedarone Hcl) .... Take One Tablet Two Times A Day 2)  Cardizem Cd 120 Mg Xr24h-Cap (Diltiazem Hcl Coated Beads) .... One Capsule Once Daily 3)  Lisinopril 5 Mg Tabs (Lisinopril) .... Take One Tablet Once Daily 4)  Levothroid 88 Mcg Tabs (Levothyroxine Sodium) .... Take One Tablet Once Daily 5)  Keppra 1000 Mg Tabs (Levetiracetam) .... Take One and 1/2 Twice Daily 6)  Vitamin D 2000 U .... Daily 7)  Arixtra 2.5 Mg/0.48ml Soln (Fondaparinux Sodium) .... Every Other Day 8)  Cardizem Cd 240 Mg Xr24h-Cap (Diltiazem Hcl Coated Beads) .... Take 1/2 Tablet Daily 9)  Polyethylene Glycol 3350  Powd (Polyethylene Glycol 3350) .... Take 17 Grams Daily As Needed Constipation 10)  Valtrex 1 Gm Tabs (Valacyclovir Hcl) .... Three Doses As Needed Labial Herpes 11)  Amoxicillin 500 Mg Caps (Amoxicillin) .... **no Dose Specified** As Needed Dental Work 12)  Femring 0.05 Mg/24hr Ring (Estradiol Acetate) .... Daily 13)  Tylenol Extra Strength 500 Mg Tabs (Acetaminophen) .... As Needed 14)  Multivitamins  Caps (Multiple Vitamin) .... Daily 15)  Align  Caps (Probiotic Product) ....  Daily  Allergies (verified): 1)  ! * Vitamin K 2)  ! Heparin 3)  ! 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  Past Medical History:  1. Rheumatic heart disease status post percutaneous valvuloplasty at  [facility] in 1995 with subsequent mechanical mitral valve replacement at [facility].   2. History of prior cerebrovascular accidents.   3. Subdural hematoma, on Coumadin.   4. Antiphospholipid antibody syndrome.   5. Heparin-induced thrombocytopenia.   6. Hypertension.   7. History of Hashimoto thyroiditis.   8. 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  Vital Signs:  Patient profile:   65 year old female Height:      63 inches Weight:      163 pounds BMI:     28.98 Pulse rate:   52 / minute Pulse rhythm:   irregular BP sitting:   106 / 68  (left arm) Cuff size:   large  Vitals Entered By: Judithe Modest CMA (July 02, 2009 9:31 AM)  Physical Exam  General:  Well developed, well nourished, in no acute distress. Head:  normocephalic and atraumatic Eyes:  PERRLA/EOM intact; conjunctiva and lids normal. Mouth:  Teeth, gums and palate normal. Oral mucosa  normal. Neck:  Neck supple, no JVD. No masses, thyromegaly or abnormal cervical nodes. Lungs:  Clear bilaterally to auscultation and percussion. Heart:  RRR with frequent ectopy, mechanical S1. 2/6 SEM LLSB Abdomen:  Bowel sounds positive; abdomen soft and non-tender without masses, organomegaly, or hernias noted. No hepatosplenomegaly. Msk:  Back normal, normal gait. Muscle strength and tone  normal. Pulses:  pulses normal in all 4 extremities Extremities:  No clubbing or cyanosis. Neurologic:  Alert and oriented x 3.  CNII-XII intact, strength/sensation are intact Skin:  Intact without lesions or rashes. Cervical Nodes:  no significant adenopathy Psych:  Normal affect.   Echocardiogram  Procedure date:  06/03/2009  Findings:        Study Conclusions    - Left ventricle: The cavity size was normal. Systolic function was     normal. The estimated ejection fraction was in the range of 50% to     55%. Doppler parameters are consistent with a reversible     restrictive pattern, indicative of decreased left ventricular     diastolic compliance and/or increased left atrial pressure (grade     3 diastolic dysfunction).   - Aortic valve: Trivial regurgitation.   - Mitral valve: A prosthesis was present and functioning normally.     The prosthesis had a normal range of motion. The sewing ring     appeared normal, had no rocking motion, and showed no evidence of     dehiscence.   - Left atrium: The atrium was moderately dilated.   - Right atrium: The atrium was mildly dilated.   Transthoracic echocardiography. M-mode, complete 2D, spectral   Doppler, and color Doppler. Height: Height: 160cm. Height: 63in.   Weight: Weight: 70.5kg. Weight: 155lb. Body mass index: BMI:   27.5kg/m^2. Body surface area: BSA: 1.90m^2. Blood pressure: 120/70.   Patient status: Inpatient. Location: Bedside.   EKG  Procedure date:  07/02/2009  Findings:      sinus bradycardia 54 bpm, PR 116, QTc 490, IVCD (QRS 124)  Impression & Recommendations:  Problem # 1:  ATRIAL TACHYCARDIA (ICD-427.89) Appears to be controlled with Multaq presently.  She would be very high risk for ablation given prior stroke and coagulopathy.  (She is chronically anticoagulated with arixtra).   I have stopped cardizem today. The patient will contact my office if further arrhythmias occur.  Her updated medication  list for this problem includes:    Multaq 400 Mg Tabs (Dronedarone hcl) .Marland Kitchen... Take one tablet two times a day    Cardizem Cd 120 Mg Xr24h-cap (Diltiazem hcl coated beads) ..... One capsule once daily    Lisinopril 5 Mg Tabs (Lisinopril) .Marland Kitchen... Take one tablet once daily    Arixtra 2.5 Mg/0.65ml Soln (Fondaparinux sodium) ..... Every other day    Cardizem Cd 240 Mg Xr24h-cap (Diltiazem hcl coated beads) .Marland Kitchen... Take 1/2 tablet daily  Problem # 2:  ACUTE ON CHRONIC DIASTOLIC HEART FAILURE (ICD-428.33) The patient has recently had difficulty with renal failure and also worsened fluid accumulation. With restarting HCTZ her symptoms have improved.  SHe has very little CHF by exam today. I will stop cardizem but continue other medicines including HCTZ.   I have referred her to Dr Jens Som for longterm management of her chronic valvular heart disease, as unfortunately, she is dissatisfied with care provided by Dr Katrinka Blazing. Her updated medication list for this problem includes:    Multaq 400 Mg Tabs (Dronedarone hcl) .Marland Kitchen... Take one tablet two times a day    Cardizem  Cd 120 Mg Xr24h-cap (Diltiazem hcl coated beads) ..... One capsule once daily    Lisinopril 5 Mg Tabs (Lisinopril) .Marland Kitchen... Take one tablet once daily    Arixtra 2.5 Mg/0.65ml Soln (Fondaparinux sodium) ..... Every other day    Cardizem Cd 240 Mg Xr24h-cap (Diltiazem hcl coated beads) .Marland Kitchen... Take 1/2 tablet daily  Problem # 3:  ACUTE ON CHRONIC DIASTOLIC HEART FAILURE (ICD-428.33) Recent elevation of creatinine may be partially due to multaq which is known to elevated creatinine without significantly affecting creatinine clearance.  She has a creatinine clearance and BMP ordered for Monday by Dr Cyndie Chime.  She declines blood work today. We need to be careful with diuretics/ HCTZ.  Also, if renal failure worsens, she may need to stop ACE inhibitor. She is being referred to nephrology by Dr Cyndie Chime.  Her updated medication list for this problem  includes:    Multaq 400 Mg Tabs (Dronedarone hcl) .Marland Kitchen... Take one tablet two times a day    Cardizem Cd 120 Mg Xr24h-cap (Diltiazem hcl coated beads) ..... One capsule once daily    Lisinopril 5 Mg Tabs (Lisinopril) .Marland Kitchen... Take one tablet once daily    Arixtra 2.5 Mg/0.93ml Soln (Fondaparinux sodium) ..... Every other day    Cardizem Cd 240 Mg Xr24h-cap (Diltiazem hcl coated beads) .Marland Kitchen... Take 1/2 tablet daily  Patient Instructions: 1)  Your physician recommends that you schedule a follow-up appointment in: 6 WEEKS WITH DR. Omie Ferger AND HE NEEDS TO SEE DR. CRENSHAW AS A NEW PATIENT AS FIRST AVAILABLE.

## 2010-07-28 NOTE — Letter (Signed)
[facility]   Imported By: Roderic Ovens 10/18/2009 15:45:29  _____________________________________________________________________  External Attachment:    Type:   Image     Comment:   External Document

## 2010-07-28 NOTE — Assessment & Plan Note (Signed)
Summary: eph./ appt is 12:15/ gd   Primary Provider:  Iran Planas, MD   History of Present Illness: 65 -year-old female I saw in Jan 2011 for evaluation of rheumatic heart disease, atrial tachycardia, diastolic congestive heart failure. The patient  had mitral valvuloplasty at [facility] in the late 90s. In 2007 she had a mechanical mitral valve replacement at [facility]. 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 She also has antiphospholipid antibody syndrome and is on chronic Coumadin. She's had a prior subdural hematoma that required evacuation. She also has a history of heparin-induced thrombocytopenia. Her last echocardiogram was performed in December of 2010. This revealed an ejection fraction of 50-55%, a with normally functioning prosthetic mitral valve, moderate left atrial enlargement and mild right atrial enlargement. There was also grade 3 diastolic dysfunction. Note she is also followed by Dr. Johney Frame for SVT. He feels that this is an atrial tachycardia. She was recently admitted and was noted to have atrial tachycardia at a rate of 160. She is felt to be high risk for ablation. She underwent cardioversion. She was placed on amiodarone. She presented to the office today in followup. She apparently has been having intermittent "dizzy spells". They last for several seconds and resolve. There is no associated palpitations, short of breath or chest pain. While she was being weighed in the office she had one of these episodes and became unresponsive. She was hooked up to telemetry and had subsequent episodes and was noted to be in torsades. She did become unresponsive with these episodes. She was given 2 g of magnesium sulfate in the office. EMS was contacted and she is now being transferred for admission.  Current Medications (verified): 1)  Levothroid 88 Mcg Tabs (Levothyroxine Sodium) .... Take One Tablet Once Daily 2)  Arixtra 2.5 Mg/0.54ml Soln (Fondaparinux  Sodium) .... Every Other Day 3)  Polyethylene Glycol 3350  Powd (Polyethylene Glycol 3350) .... Take 17 Grams Daily As Needed Constipation 4)  Valtrex 500 Mg Tabs (Valacyclovir Hcl) .... Take One Tablet By Mouth Once Daily. 5)  Amoxicillin 500 Mg Caps (Amoxicillin) .... **no Dose Specified** As Needed Dental Work 6)  Femring 0.05 Mg/24hr Ring (Estradiol Acetate) .... Daily 7)  Tylenol Extra Strength 500 Mg Tabs (Acetaminophen) .... As Needed 8)  Crestor 5 Mg Tabs (Rosuvastatin Calcium) .... Take One Tablet By Mouth Daily. 9)  Furosemide 20 Mg Tabs (Furosemide) .... Take One Tablet By Mouth Daily. 10)  Amiodarone Hcl 200 Mg Tabs (Amiodarone Hcl) .... 2 Tabs Two Times A Day With Meals 11)  Hydrocortisone 1 % Crea (Hydrocortisone) .... As Needed  Allergies: 1)  ! * Vitamin K 2)  ! Heparin 3)  ! * Iv Contrast Dye  Past History:  Past Medical History: Reviewed history from 09/01/2009 and no changes required.  1. Rheumatic heart disease status post percutaneous valvuloplasty at  [facility] in 1995 with subsequent mechanical mitral valve replacement and tricuspid annuloplasty at [facility].   2. History of prior cerebrovascular accidents.   3. Subdural hematoma, on Coumadin.   4. Antiphospholipid antibody syndrome.   5. Heparin-induced thrombocytopenia.   6. Hypertension.   7. History of Hashimoto thyroiditis.   8.  Hyperlipidemia  9. atypical atrial flutter  Past Surgical History: Reviewed history from 09/01/2009 and no changes required. Mitral valve replacement and  tricuspid annuloplasty 2007 L atrial appendage amputated at that time Craniotomy for SDH 2008 TAH Cholecystecomty Tonsillectomy  Family History: Reviewed history  from 07/26/2009 and no changes required. Father with CABG late 70's or 51's  Social History: Reviewed history from 07/26/2009 and no changes required.  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  She denies tobacco, alcohol, or drug  use. Married   Review of Systems       no fevers or chills, productive cough, hemoptysis, dysphasia, odynophagia, melena, hematochezia, dysuria, hematuria, rash, seizure activity, orthopnea, PND, pedal edema, claudication. Remaining systems are negative.   Vital Signs:  Patient profile:   65 year old female Pulse rate:   50 / minute BP supine:   110 / 60  Physical Exam  General:  Well developed/well nourished in moderate distress Skin diaphoretic No peripheral clubbing Back-normal HEENT-normal/normal eyelids Neck supple/normal carotid upstroke bilaterally; no bruits; no JVD; no thyromegaly chest - CTA/ normal expansion CV - RRR/normal S1 and S2; no murmurs, rubs or gallops; PMI nondisplaced; Chrisp mechanical valve sound Abdomen -NT/ND, no HSM, no mass, + bowel sounds, no bruit 2+ femoral pulses, no bruits Ext-no edema, chords, 2+ DP Neuro-grossly nonfocal     Impression & Recommendations:  Problem # 1:  VENTRICULAR TACHYCARDIA (ICD-427.1) Patient noted to have polymorphic ventricular tachycardia in the office with loss of consciousness. Her QT is markedly prolonged. She will be admitted to the CCU at [facility]. We will check electrolytes. She was given magnesium in the office which improved her rhythm disturbance. Her amiodarone will be discontinued. I will ask EP to evaluate the patient further. Check echocardiogram.  Problem # 2:  MITRAL VALVE REPLACEMENT, HX OF (ICD-V15.1) Continue arixtra. No Coumadin given history of intracranial hemorrhage. Also note history of heparin-induced thrombocytopenia.  Problem # 3:  ESSENTIAL HYPERTENSION, BENIGN (ICD-401.1) Blood pressure controlled at present. The following medications were removed from the medication list:    Cardizem Cd 240 Mg Xr24h-cap (Diltiazem hcl coated beads) ..... One capsule two times a day Her updated medication list for this problem includes:    Furosemide 20 Mg Tabs (Furosemide) .Marland Kitchen... Take one  tablet by mouth daily.  The following medications were removed from the medication list:    Cardizem Cd 240 Mg Xr24h-cap (Diltiazem hcl coated beads) ..... One capsule two times a day Her updated medication list for this problem includes:    Furosemide 20 Mg Tabs (Furosemide) .Marland Kitchen... Take one tablet by mouth daily.  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  Problem # 7:  ATRIAL TACHYCARDIA (ICD-427.89)  Will review options with EP. Discontinue amiodarone. The following medications were removed from the medication list:    Multaq 400 Mg Tabs (Dronedarone hcl) .Marland Kitchen... Take one tablet two times a day    Cardizem Cd 240 Mg Xr24h-cap (Diltiazem hcl coated beads) ..... One capsule two times a day Her updated medication list for this problem includes:    Arixtra 2.5 Mg/0.78ml Soln (Fondaparinux sodium) ..... Every other day    Amiodarone Hcl 200 Mg Tabs (Amiodarone hcl) .Marland Kitchen... 2 tabs two times a day with meals  The following medications were removed from the medication list:    Multaq 400 Mg Tabs (Dronedarone hcl) .Marland Kitchen... Take one tablet two times a day    Cardizem Cd 240 Mg Xr24h-cap (Diltiazem hcl coated beads) ..... One capsule two times a day Her updated medication list for this problem includes:    Arixtra 2.5 Mg/0.35ml Soln (Fondaparinux sodium) .Marland KitchenMarland KitchenMarland KitchenMarland Kitchen  Every other day    Amiodarone Hcl 200 Mg Tabs (Amiodarone hcl) .Marland Kitchen... 2 tabs two times a day with meals  Problem # 8:  ACUTE ON CHRONIC DIASTOLIC HEART FAILURE (ICD-428.33)  Patient with history of diastolic CHF. Continue diuretics. The following medications were removed from the medication list:    Multaq 400 Mg Tabs (Dronedarone hcl) .Marland Kitchen... Take one tablet two times a day    Cardizem Cd 240 Mg Xr24h-cap (Diltiazem hcl coated beads) ..... One capsule two times a day Her  updated medication list for this problem includes:    Arixtra 2.5 Mg/0.83ml Soln (Fondaparinux sodium) ..... Every other day    Furosemide 20 Mg Tabs (Furosemide) .Marland Kitchen... Take one tablet by mouth daily.    Amiodarone Hcl 200 Mg Tabs (Amiodarone hcl) .Marland Kitchen... 2 tabs two times a day with meals  The following medications were removed from the medication list:    Multaq 400 Mg Tabs (Dronedarone hcl) .Marland Kitchen... Take one tablet two times a day    Cardizem Cd 240 Mg Xr24h-cap (Diltiazem hcl coated beads) ..... One capsule two times a day Her updated medication list for this problem includes:    Arixtra 2.5 Mg/0.49ml Soln (Fondaparinux sodium) ..... Every other day    Furosemide 20 Mg Tabs (Furosemide) .Marland Kitchen... Take one tablet by mouth daily.    Amiodarone Hcl 200 Mg Tabs (Amiodarone hcl) .Marland Kitchen... 2 tabs two times a day with meals  Problem # 9:  SUBDURAL HEMATOMA (ICD-432.1) History of subdural hematoma.    Medication Administration  Injection # 1:    Medication: Magnesium    Diagnosis: VENTRICULAR TACHYCARDIA (ICD-427.1)    Route: IV    Comments: Gave pt 2 grams of Magnesium IV per Dr. Ludwig Clarks order.    Patient tolerated injection without complications    Given by: Milana Na   Appended Document: eph./ appt is 12:15/ gd Pt was given 2 grams of Magnesium Sulfate IV during the code in the office per Dr. Ludwig Clarks order; administered by Milana Na, EMTP.  Lot # [id number]  Exp. Aug. 2011 for both vials.  Minerva Areola, RN, BSN  09/30/09  1256

## 2010-07-28 NOTE — Progress Notes (Signed)
Summary: pt wants to restart lasix  Phone Note Call from Patient Call back at Home Phone [phone]   Caller: Patient Reason for Call: Talk to Nurse Summary of Call: pt wants to restart lasix  Initial call taken by: Lorne Skeens,  October 18, 2009 9:34 AM  Follow-up for Phone Call        weight is back down.  She is going to back down on the lasix and only take the Potassium on the days she takes the Lasix.  Will call in a refill for the Potassium. Dennis Bast, RN, BSN  October 18, 2009 11:41 AM    New/Updated Medications: POTASSIUM CHLORIDE CR 10 MEQ CR-CAPS (POTASSIUM CHLORIDE) Take one tablet by mouth daily Prescriptions: POTASSIUM CHLORIDE CR 10 MEQ CR-CAPS (POTASSIUM CHLORIDE) Take one tablet by mouth daily  #30 x 6   Entered by:   Dennis Bast, RN, BSN   Authorized by:   Hillis Range, MD   Signed by:   Dennis Bast, RN, BSN on 10/18/2009   Method used:   Faxed to ...       [facility] (retail)       [address] [place]       [address]       [place], [place]  [address]       Ph: [phone]       Fax: [phone]   RxID:   [id number]

## 2010-07-28 NOTE — Progress Notes (Signed)
Summary: tachycardia/SOB/dyspnea  Phone Note Call from Patient Call back at Home Phone [phone]   Caller: Patient Reason for Call: Talk to Nurse Summary of Call: pt saw renal dr, they changed lasix to every other day, having episodes of tachycardia, SOB, having trouble speaking, dyspnea, not sure if this is the results of lasix change or if there is another problem Initial call taken by: Migdalia Dk,  September 21, 2009 11:30 AM  Follow-up for Phone Call        Spoke with Patient who says Dr Eliott Nine changed her meds becuase creatine was high.  Switched her to every other day and now  has her usualy cough, which is her symptom of failure.  Cannot make complete sentences due to shortness of breath.   Advised her to go to ER.  States her husband will drive her ,advised her if she has any problem enroute to hospital to pull over and dial 911.     Will let hospital know and page Trish

## 2010-07-28 NOTE — Progress Notes (Signed)
Summary: question re appt/mt  Phone Note Call from Patient   Caller: Patient Reason for Call: Talk to Nurse Summary of Call: pt has question re her [phone] her mom and has to go to [facility] for her funeral-pls call [phone] Initial call taken by: Glynda Jaeger,  March 01, 2010 4:19 PM  Follow-up for Phone Call        wants Dr Johney Frame to call her and give her his advise.  Wants to know what to do if something happens while she is in [place] at her mothers [name]   I spoke with patient and provided sympathy with the recent loss of her mother. Upon Pts return from [facility], we will consider referral to [facility] for ablation per pts request. Follow-up by: Hillis Range, MD,  March 02, 2010 9:36 AM

## 2010-07-28 NOTE — Letter (Signed)
Summary: CardioPulmonary Resuscitation Record  CardioPulmonary Resuscitation Record   Imported By: Marylou Mccoy 11/19/2009 09:12:20  _____________________________________________________________________  External Attachment:    Type:   Image     Comment:   External Document

## 2010-07-28 NOTE — Progress Notes (Signed)
Summary: speak to nurse  Phone Note Call from Patient Call back at Home Phone [phone]   Caller: Patient Reason for Call: Talk to Nurse Summary of Call: req to speak to nurse about setting up something for her and her husband to come in and talk Initial call taken by: Migdalia Dk,  September 15, 2009 8:53 AM  Follow-up for Phone Call        wants to come him and see Dr Ilai Hiller with husband on a Mon or Thurs.  Will have Jamie call pt Cassandra Bast, RN, BSN  September 15, 2009 12:18 PM

## 2010-07-28 NOTE — Assessment & Plan Note (Signed)
Summary: F3M/MJ   Visit Type:  Follow-up Primary Dajohn Ellender:  Iran Planas, MD   History of Present Illness: The patient presents today for electrophysiology followup. She reports doing very well since last being seen in our clinic.  She has been in [place] since her mother's recent death.   She denies further symptomatic atrial arrhythmias and is doing very well at this time.   The patient denies symptoms of  chest pain, shortness of breath, orthopnea, PND, lower extremity edema, dizziness, presyncope, syncope, or neurologic sequela. The patient is tolerating medications without difficulties and is otherwise without complaint today.   Current Medications (verified): 1)  Levothroid 75 Mcg Tabs (Levothyroxine Sodium) .... Once Daily 2)  Arixtra 2.5 Mg/0.72ml Soln (Fondaparinux Sodium) .... Every Other Day 3)  Polyethylene Glycol 3350  Powd (Polyethylene Glycol 3350) .... Take 17 Grams Daily As Needed Constipation 4)  Valtrex 1 Gm Tabs (Valacyclovir Hcl) .... As Needed 5)  Amoxicillin 500 Mg Caps (Amoxicillin) .... **no Dose Specified** As Needed Dental Work 6)  Femring 0.05 Mg/24hr Ring (Estradiol Acetate) .... Daily 7)  Tylenol Extra Strength 500 Mg Tabs (Acetaminophen) .... As Needed 8)  Crestor 5 Mg Tabs (Rosuvastatin Calcium) .... Take One Tablet By Mouth Daily. 9)  Furosemide 20 Mg Tabs (Furosemide) .... As Needed 10)  Potassium Chloride Cr 10 Meq Cr-Caps (Potassium Chloride) .... Take One Tablet As Needed 11)  Vitamin D 2000 Unit Tabs (Cholecalciferol) .... Take One Tablet By Mouth Once Daily. 12)  Cardizem Cd 240 Mg Xr24h-Cap (Diltiazem Hcl Coated Beads) .... One By Mouth Daily 13)  Calcium-Magnesium .... Once Daily 14)  Fish Oil   Oil (Fish Oil) .... Uad  Allergies (verified): 1)  ! * Vitamin K 2)  ! Heparin 3)  ! * Iv Contrast Dye 4)  ! Amiodarone Hcl  Past History:  Past Medical History: Reviewed history from 11/01/2009 and no changes required.  1. Rheumatic heart disease  status post percutaneous valvuloplasty at  [facility] in 1995 with subsequent mechanical mitral valve replacement and tricuspid annuloplasty at [facility].   2. History of prior cerebrovascular accidents.   3. Subdural hematoma, on Coumadin.   4. Antiphospholipid antibody syndrome.   5. Heparin-induced thrombocytopenia.   6. Hypertension.   7. History of Hashimoto thyroiditis.   8.  Hyperlipidemia  9. 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 Mitral valve replacement and  tricuspid annuloplasty 2007 L atrial appendage amputated at that time Craniotomy for SDH 2008 TAH Cholecystecomty Tonsillectomy  Social History: Reviewed history from 07/26/2009 and no changes required.  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 Married   Review of Systems       All systems are reviewed and negative except as listed in the HPI.   Vital Signs:  Patient profile:   65 year old female Height:      63 inches Weight:      151 pounds BMI:     26.85 Pulse rate:   53 / minute BP sitting:   102 / 60  Vitals Entered By: Laurance Flatten CMA (April 18, 2010 1:40 PM)  Physical Exam  General:  Well developed, well nourished, in no acute distress. Head:  normocephalic and atraumatic Eyes:  PERRLA/EOM intact; conjunctiva and lids normal. Mouth:  Teeth, gums and palate normal. Oral mucosa normal. Neck:  Neck supple, no JVD. No masses, thyromegaly or  abnormal cervical nodes. Lungs:  Clear bilaterally to auscultation and percussion. Heart:  RRR with frequent ectopy, mechanical S1. 2/6 SEM LLSB Abdomen:  Bowel sounds positive; abdomen soft and non-tender without masses, organomegaly, or hernias noted. No hepatosplenomegaly. Msk:  Back normal, normal gait. Muscle strength and tone normal. Pulses:  pulses normal in all 4 extremities Extremities:  No clubbing  or cyanosis. Neurologic:  Alert and oriented x 3.  CNII-XII intact, strength/sensation are intact Skin:  Intact without lesions or rashes. Psych:  Normal affect.   EKG  Procedure date:  04/18/2010  Findings:      sinus bradycardia 53 bpm, LBBB (QRS 134 ms)  Impression & Recommendations:  Problem # 1:  ATRIAL TACHYCARDIA (ICD-427.89) doing well, maintaining sinus rhythm continue arixtra and cardizem  I have recommended that she meet with Dr Smith Robert at [facility] to consider catheter ablation should she develop recurrent atrial tachycardia/ atypical flutter.  She is very reluctant to do so, but is willing to consider.  No changes today  Problem # 2:  ESSENTIAL HYPERTENSION, BENIGN (ICD-401.1) stable  Problem # 3:  MITRAL VALVE REPLACEMENT, HX OF (ICD-V15.1) continue arixtra  we will repeat echo   Problem # 4:  ACUTE ON CHRONIC DIASTOLIC HEART FAILURE (ICD-428.33) much improved she has a LBBB but no significant CHF and a preserved EF,  we will therefore not proceed with resynchronization at this time we will repeat echo as above  Other Orders: Echocardiogram (Echo)  Patient Instructions: 1)  Your physician wants you to follow-up in: 6 months with Dr Jacquiline Doe will receive a reminder letter in the mail two months in advance. If you don't receive a letter, please call our office to schedule the follow-up appointment. 2)  Your physician has requested that you have an echocardiogram.  Echocardiography is a painless test that uses sound waves to create images of your heart. It provides your doctor with information about the size and shape of your heart and how well your heart's chambers and valves are working.  This procedure takes approximately one hour. There are no restrictions for this procedure. Get echo now

## 2010-07-28 NOTE — Progress Notes (Signed)
Summary: [facility]: Office Visit  [facility]: Office Visit   Imported By: Earl Many 10/01/2009 18:25:43  _____________________________________________________________________  External Attachment:    Type:   Image     Comment:   External Document

## 2010-07-28 NOTE — Miscellaneous (Signed)
Summary: referl to [facility] Dr Smith Robert  Clinical Lists Changes  Orders: Added new Referral order of Misc. Referral (Misc. Ref) - Signed

## 2010-07-28 NOTE — Letter (Signed)
Summary: [facility] Patient Note   [facility] Patient Note   Imported By: Roderic Ovens 06/14/2010 12:26:10  _____________________________________________________________________  External Attachment:    Type:   Image     Comment:   External Document

## 2010-07-28 NOTE — Letter (Signed)
Summary: Appointment - Missed  [facility], Main Office  [address]   [place], [place] [address]   Phone: [phone]  Fax: [phone]     March 18, 2010 MRN: [id number]   [name] [address] [place], [place]  [address]   Dear Ms. Zelek,  Our records indicate you missed your appointment on  03-14-2010 with Dr.  Johney Frame.                                It is very important that we reach you to reschedule this appointment. We look forward to participating in your health care needs. Please contact us at the number listed above at your earliest convenience to reschedule this appointment.     Sincerely,    Glass blower/designer

## 2010-07-28 NOTE — Letter (Signed)
[facility]   Imported By: Roderic Ovens 08/20/2009 11:20:00  _____________________________________________________________________  External Attachment:    Type:   Image     Comment:   External Document

## 2010-07-28 NOTE — Progress Notes (Signed)
Summary: med question/heart rate  Phone Note Call from Patient   Caller: Patient Summary of Call: takes 240mg  Diltiazem , still has very rapid heart rate will take 120 @ lunch, should she take another if it should cont? Initial call taken by: Migdalia Dk,  July 16, 2009 11:02 AM  Follow-up for Phone Call        per Dr Johney Frame take 240mg  in the am and 120mg  in the afternoon of the Diltizem.  LMOM for pt Dennis Bast, RN, BSN  July 16, 2009 1:24 PM

## 2010-07-28 NOTE — Progress Notes (Signed)
Summary: rates in 150's aclling about her Diltaizem 240mg   Phone Note Call from Patient Call back at Home Phone [phone]   Caller: Patient Summary of Call: Pt said that she was still going into a high rate 150 and was told to call Kelly Diltiazem going up to 240mg  twice a day  want a call back about rather to take  the 240 mg. Initial call taken by: Judie Grieve,  July 22, 2009 9:13 AM  Follow-up for Phone Call        [facility] Scherrie Bateman, LPN  July 22, 2009 9:17 AM  returning call, Migdalia Dk  July 22, 2009 1:27 PM   Additional Follow-up for Phone Call Additional follow up Details #1::        SPOKE WITH PT  RE ELEVATED HR. PER PT HAD TX PLAN WITH DR Luma Clopper IF HR  CONT TO BE  ELEVATED MAY INCREASE DILTIAZEM TO 240 MG two times a day. WILL FORWARD TO DR Johney Frame PT WILL INCREASE MED AS INSTRUCTED . Additional Follow-up by: Scherrie Bateman, LPN,  July 22, 2009 3:00 PM    Additional Follow-up for Phone Call Additional follow up Details #2::    Increase cardizem to 240mg  BID Follow-up by: Hillis Range, MD,  July 31, 2009 11:04 PM

## 2010-07-28 NOTE — Letter (Signed)
Summary: [facility] Office Visit Note   [facility] Office Visit Note   Imported By: Roderic Ovens 04/08/2010 12:44:32  _____________________________________________________________________  External Attachment:    Type:   Image     Comment:   External Document

## 2010-07-28 NOTE — Progress Notes (Signed)
Summary: Calling regarding her tachycardia  Phone Note Call from Patient Call back at [phone]   Caller: Patient Summary of Call: Pt said her Tachycardia has gone away,and  base line rhythm is irregular call [phone] Initial call taken by: Judie Grieve,  September 09, 2009 10:19 AM  Follow-up for Phone Call        Called pt back and left message for her to call back if she needed anything.  Her records have been faxed to Dr Eldridge Dace, RN, BSN  September 09, 2009 5:12 PM

## 2010-07-28 NOTE — Assessment & Plan Note (Signed)
Summary: NEW EVAL   Primary Provider:  Iran Planas, MD   History of Present Illness: 65 -year-old female with past medical history of rheumatic heart disease, atrial tachycardia, diastolic congestive heart failure for evaluation of these issues. She has previously been followed by Dr. Verdis Prime. The patient apparently had mitral valvuloplasty at [facility] in the late 90s. In 2007 she had a mechanical mitral valve replacement at [facility]. 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 She also has antiphospholipid antibody syndrome and is on chronic Coumadin. She's had a prior subdural hematoma that required evacuation. She also has a history of heparin-induced thrombocytopenia. Her last echocardiogram was performed in December of 2010. This revealed an ejection fraction of 50-55%, a with normally functioning prosthetic mitral valve, moderate left atrial enlargement and mild right atrial enlargement. There was also grade 3 diastolic dysfunction. Note she is also followed by Dr. Johney Frame for SVT. He feels that this is an atrial tachycardia and she is trying to control this with multaq and Cardizem. The patient states that since August of 2010 she has noticed episodes of elevated heart rate. She will feel her heart racing" pound ". She also noticed increased dyspnea with exercise. It is not associated with chest pain. It is relieved with rest. There is no orthopnea, PND, pedal edema or exertional chest pain. She occasionally feels a pressure in the left lateral chest area that is not related to exertion. Is not pleuritic or positional and there is no associated symptoms. It resolves spontaneously.  Current Medications (verified): 1)  Multaq 400 Mg Tabs (Dronedarone Hcl) .... Take One Tablet Two Times A Day 2)  Cardizem Cd 240 Mg Xr24h-Cap (Diltiazem Hcl Coated Beads) .... One By Mouth Daily 3)  Levothroid 88 Mcg Tabs (Levothyroxine Sodium) .... Take One Tablet Once Daily 4)   Keppra 500 Mg Tabs (Levetiracetam) .... 2 Tabs Two Times A Day 5)  Vitamin D 2000 U .... Daily 6)  Arixtra 2.5 Mg/0.58ml Soln (Fondaparinux Sodium) .... Every Other Day 7)  Polyethylene Glycol 3350  Powd (Polyethylene Glycol 3350) .... Take 17 Grams Daily As Needed Constipation 8)  Valtrex 1 Gm Tabs (Valacyclovir Hcl) .... Three Doses As Needed Labial Herpes 9)  Amoxicillin 500 Mg Caps (Amoxicillin) .... **no Dose Specified** As Needed Dental Work 10)  Femring 0.05 Mg/24hr Ring (Estradiol Acetate) .... Daily 11)  Tylenol Extra Strength 500 Mg Tabs (Acetaminophen) .... As Needed 12)  Align  Caps (Probiotic Product) .... Daily 13)  Crestor 5 Mg Tabs (Rosuvastatin Calcium) .... Take One Tablet By Mouth Daily. 14)  Hydrochlorothiazide 12.5 Mg Tabs (Hydrochlorothiazide) .... Take One Tablet By Mouth Daily.  Allergies: 1)  ! * Vitamin K 2)  ! Heparin 3)  ! * Iv Contrast Dye  Past History:  Past Medical History:  1. Rheumatic heart disease status post percutaneous valvuloplasty at  [facility] in 1995 with subsequent mechanical mitral valve replacement at [facility]   2. History of prior cerebrovascular accidents.   3. Subdural hematoma, on Coumadin.   4. Antiphospholipid antibody syndrome.   5. Heparin-induced thrombocytopenia.   6. Hypertension.   7. History of Hashimoto thyroiditis.   8. Prior tricuspid annuloplasty  9. Hyperlipidemia  Past Surgical History: Mitral valve replacement and  tricuspid annuloplasty 2007 Craniotomy for SDH 2008 TAH Cholecystecomty Tonsillectomy  Family History: Reviewed history from 06/30/2009 and no changes required. Father with CABG late 50's or 59's  Social History: Reviewed history from  07/02/2009 and no changes required.  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 Married   Review of Systems       no fevers or chills, productive cough, hemoptysis, dysphasia, odynophagia, melena,  hematochezia, dysuria, hematuria, rash, seizure activity, orthopnea, PND, pedal edema, claudication. Remaining systems are negative.   Vital Signs:  Patient profile:   65 year old female Height:      63 inches Weight:      165 pounds BMI:     29.33 Pulse rate:   75 / minute BP sitting:   122 / 76  (left arm) Cuff size:   regular  Vitals Entered By: Hardin Negus, RMA (July 26, 2009 11:00 AM)  Physical Exam  General:  Well developed/well nourished in NAD Skin warm/dry Patient not depressed No peripheral clubbing Back-normal HEENT-normal/normal eyelids Neck supple/normal carotid upstroke bilaterally; no bruits; no JVD; no thyromegaly chest - CTA/ normal expansion CV - RRR/normal S1 and S2; crisp mechanical valve sounds, 2/6 systolic ejection murmur. Abdomen -NT/ND, no HSM, no mass, + bowel sounds, no bruit, status post cholecystectomy 2+ femoral pulses, no bruits Ext-no edema, chords, 2+ DP Neuro-grossly nonfocal     EKG  Procedure date:  07/26/2009  Findings:      sinus rhythm with first-degree AV block and left bundle branch block.  Impression & Recommendations:  Problem # 1:  DYSPNEA (ICD-786.05) The etiology of this is unclear. She does feel increased heart rate with these episodes and she may require an event monitor to see if there is a contribution from her SVT. I will discuss this with Dr. Johney Frame. She also has renal insufficiency and grade 3 diastolic dysfunction. I will check a BNP and if elevated we may increase her diuretic. Her recent echocardiogram showed a normally functioning mitral valve and normal LV function. Her updated medication list for this problem includes:    Cardizem Cd 240 Mg Xr24h-cap (Diltiazem hcl coated beads) ..... One by mouth daily    Hydrochlorothiazide 12.5 Mg Tabs (Hydrochlorothiazide) .Marland Kitchen... Take one tablet by mouth daily.  Problem # 2:  MITRAL VALVE REPLACEMENT, HX OF (ICD-V15.1) Patient is on arixtra for her mitral valve. She  is not on Coumadin given her history of subdural hematoma. This is followed by Dr. Cyndie Chime. She will continue with SBE prophylaxis. Followup echocardiograms in the future.  Problem # 3:  ATRIAL TACHYCARDIA (ICD-427.89) This is being managed by Dr. Johney Frame. I think she will most likely require catheter ablation. She would prefer to have this done in [place] if possible. Her updated medication list for this problem includes:    Multaq 400 Mg Tabs (Dronedarone hcl) .Marland Kitchen... Take one tablet two times a day    Cardizem Cd 240 Mg Xr24h-cap (Diltiazem hcl coated beads) ..... One by mouth daily    Arixtra 2.5 Mg/0.91ml Soln (Fondaparinux sodium) ..... Every other day  Problem # 4:  ACUTE ON CHRONIC DIASTOLIC HEART FAILURE (ICD-428.33) As per above we will check a BNP. If elevated I will increase her diuretic. Her updated medication list for this problem includes:    Multaq 400 Mg Tabs (Dronedarone hcl) .Marland Kitchen... Take one tablet two times a day    Cardizem Cd 240 Mg Xr24h-cap (Diltiazem hcl coated beads) ..... One by mouth daily    Arixtra 2.5 Mg/0.25ml Soln (Fondaparinux sodium) ..... Every other day    Hydrochlorothiazide 12.5 Mg Tabs (Hydrochlorothiazide) .Marland Kitchen... Take one tablet by mouth daily.  Orders: TLB-BNP (  B-Natriuretic Peptide) (83880-[id number])  Problem # 5:  CHRONIC KIDNEY DISEASE UNSPECIFIED (ICD-585.9) She is scheduled to see a nephrologist in March.  Problem # 6:  HEPARIN-INDUCED THROMBOCYTOPENIA (ICD-289.84)  Problem # 7:  ESSENTIAL HYPERTENSION, BENIGN (ICD-401.1) Blood pressure controlled on present medications. Will continue. Her updated medication list for this problem includes:    Cardizem Cd 240 Mg Xr24h-cap (Diltiazem hcl coated beads) ..... One by mouth daily    Hydrochlorothiazide 12.5 Mg Tabs (Hydrochlorothiazide) .Marland Kitchen... Take one tablet by mouth daily.  Problem # 8:  OTHER&UNSPEC NONSPECIFIC IMMUNOLOGICAL FINDINGS (ICD-795.79) Patient has a history of antiphospholipid antibody  syndrome. Continue present medications.  Problem # 9:  SUBDURAL HEMATOMA (ICD-432.1) Patient with history of subdural hematoma.  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 Please refer to the Current Medication list given to you today.

## 2010-07-28 NOTE — Assessment & Plan Note (Signed)
Summary: eph   Visit Type:  Follow-up Primary Provider:  Iran Planas, MD   History of Present Illness: The patient presents today for routine electrophysiology followup. She reports significant fatigue and decreased exercise tolerance with atrial flutter.  She reports occasional SOB.  The patient denies symptoms of palpitations, chest pain,  orthopnea, PND, lower extremity edema, dizziness, presyncope, syncope, or neurologic sequela. The patient is tolerating medications without difficulties and is otherwise without complaint today.   Current Medications (verified): 1)  Multaq 400 Mg Tabs (Dronedarone Hcl) .... Take One Tablet Two Times A Day 2)  Cardizem Cd 240 Mg Xr24h-Cap (Diltiazem Hcl Coated Beads) .... One Capsule Two Times A Day 3)  Levothroid 88 Mcg Tabs (Levothyroxine Sodium) .... Take One Tablet Once Daily 4)  Vitamin D 2000 U .... Daily 5)  Arixtra 2.5 Mg/0.69ml Soln (Fondaparinux Sodium) .... Every Other Day 6)  Polyethylene Glycol 3350  Powd (Polyethylene Glycol 3350) .... Take 17 Grams Daily As Needed Constipation 7)  Valtrex 500 Mg Tabs (Valacyclovir Hcl) .... Take One Tablet By Mouth Once Daily. 8)  Amoxicillin 500 Mg Caps (Amoxicillin) .... **no Dose Specified** As Needed Dental Work 9)  Femring 0.05 Mg/24hr Ring (Estradiol Acetate) .... Daily 10)  Tylenol Extra Strength 500 Mg Tabs (Acetaminophen) .... As Needed 11)  Crestor 5 Mg Tabs (Rosuvastatin Calcium) .... Take One Tablet By Mouth Daily. 12)  Furosemide 20 Mg Tabs (Furosemide) .... Take One Tablet By Mouth Daily.  Allergies (verified): 1)  ! * Vitamin K 2)  ! Heparin 3)  ! * Iv Contrast Dye  Past History:  Past Medical History:  1. Rheumatic heart disease status post percutaneous valvuloplasty at  [facility] in 1995 with subsequent mechanical mitral valve replacement and tricuspid annuloplasty at [facility]   2. History of prior cerebrovascular accidents.   3. Subdural hematoma, on Coumadin.   4.  Antiphospholipid antibody syndrome.   5. Heparin-induced thrombocytopenia.   6. Hypertension.   7. History of Hashimoto thyroiditis.   8.  Hyperlipidemia  9. atypical atrial flutter  Past Surgical History: Mitral valve replacement and  tricuspid annuloplasty 2007 L atrial appendage amputated at that time Craniotomy for SDH 2008 TAH Cholecystecomty Tonsillectomy  Family History: Reviewed history from 07/26/2009 and no changes required. Father with CABG late 70's or 17's  Social History: Reviewed history from 07/26/2009 and no changes required.  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 Married   Review of Systems       All systems are reviewed and negative except as listed in the HPI.   Vital Signs:  Patient profile:   65 year old female Height:      63 inches Weight:      159 pounds BMI:     28.27 Pulse rate:   85 / minute BP sitting:   110 / 70  (left arm)  Vitals Entered By: Laurance Flatten CMA (September 01, 2009 9:50 AM)  Physical Exam  General:  Well developed, well nourished, in no acute distress. Head:  normocephalic and atraumatic Eyes:  PERRLA/EOM intact; conjunctiva and lids normal. Mouth:  Teeth, gums and palate normal. Oral mucosa normal. Neck:  Neck supple, no JVD. No masses, thyromegaly or abnormal cervical nodes. Lungs:  Clear bilaterally to auscultation and percussion. Heart:  RRR with frequent ectopy, mechanical S1. 2/6 SEM LLSB Abdomen:  Bowel sounds positive; abdomen soft and non-tender without masses, organomegaly, or hernias noted. No  hepatosplenomegaly. Msk:  Back normal, normal gait. Muscle strength and tone normal. Pulses:  pulses normal in all 4 extremities Extremities:  No clubbing or cyanosis. Neurologic:  Alert and oriented x 3.  CNII-XII intact, strength/sensation are intact Skin:  Intact without lesions or rashes. Cervical Nodes:  no significant adenopathy Psych:  Normal  affect.    Echocardiogram  Procedure date:  06/03/2009  Findings:        Study Conclusions    - Left ventricle: The cavity size was normal. Systolic function was     normal. The estimated ejection fraction was in the range of 50% to     55%. Doppler parameters are consistent with a reversible     restrictive pattern, indicative of decreased left ventricular     diastolic compliance and/or increased left atrial pressure (grade     3 diastolic dysfunction).   - Aortic valve: Trivial regurgitation.   - Mitral valve: A prosthesis was present and functioning normally.     The prosthesis had a normal range of motion. The sewing ring     appeared normal, had no rocking motion, and showed no evidence of     dehiscence.   - Left atrium: The atrium was moderately dilated.   - Right atrium: The atrium was mildly dilated.   Transthoracic echocardiography. M-mode, complete 2D, spectral   Doppler, and color Doppler. Height: Height: 160cm. Height: 63in.   Weight: Weight: 70.5kg. Weight: 155lb. Body mass index: BMI:   27.5kg/m^2. Body surface area: BSA: 1.30m^2. Blood pressure: 120/70.   Patient status: Inpatient. Location: Bedside.   Echocardiogram  Procedure date:  07/26/2007  Findings:        SUMMARY   -  Overall left ventricular systolic function was at the lower         limits of normal. Left ventricular ejection fraction was         estimated to be 55 %. There was no diagnostic evidence of         left ventricular regional wall motion abnormalities. There         was moderate paradoxical motion of the interventricular         septum, consistent with RV-LV interaction.   -  No evidence of thrombosis There was a bileaflet mechanical mitral         valve prosthesis. The sewing ring of the mitral prosthesis         appeared normal. The prosthetic mitral valve leaflets         appeared normal. There was no significant perivalvular mitral         regurgitation. Mean transmitral  gradient was 3.5 mmHg. Mitral         valve area by pressure half-time was 3.44 cm^2.   -  The left atrium was mildly dilated.   -  The right ventricle was dilated. The estimated peak right         ventricular systolic pressure was mildly increased.   -  Estimated peak pulmonary artery systolic pressure was 35 mmHg.   -  The right atrium was mildly dilated.   -  The inferior vena cava was mildly dilated.     ---------------------------------------------------------------    Prepared and Electronically Authenticated by    Verdis Prime M.D.     EKG  Procedure date:  09/01/2009  Findings:      atypicla atrial flutter with 2:1 conduction, LBBB, QTc 487  Impression & Recommendations:  Problem # 1:  ATRIAL TACHYCARDIA (ICD-427.89) Pt continues to have episodes of fatigue and decreased exercise tolerance which I believe are due to her atypical atrial flutter.  She has had rheumatic heart disease as well as prior atriotomy which leads to a substrate for recurrent atrial arrhythmias.  Given her mechanical valvular disease and prior difficulty with anticoagulants, she is high risk for ablation. She has failed medical therapy with multaq and amiodarone.  I therefore do believe that catheter ablation may be the only alternative for her.  We had a long discussion regarding this in the office today.  I have advised that she travel to [place] [facility] [place] [place] to be evaluated by Dr Eloisa Northern for catheter ablation of her atrial arrhythmias.  She wishes to further contemplate this strategy.  In the interim, I will forward her information to Dr Sarajane Marek for his review.  I have spoken with Dr Sarajane Marek who would be happy to see this patient in his office for consideration of ablation.    Her updated medication list for this problem includes:    Multaq 400 Mg Tabs (Dronedarone hcl) .Marland Kitchen... Take one tablet two times a day    Cardizem Cd 240 Mg Xr24h-cap (Diltiazem hcl coated beads) ..... One  capsule two times a day    Arixtra 2.5 Mg/0.77ml Soln (Fondaparinux sodium) ..... Every other day  Problem # 2:  ESSENTIAL HYPERTENSION, BENIGN (ICD-401.1) stable no changes  Her updated medication list for this problem includes:    Cardizem Cd 240 Mg Xr24h-cap (Diltiazem hcl coated beads) ..... One capsule two times a day    Furosemide 20 Mg Tabs (Furosemide) .Marland Kitchen... Take one tablet by mouth daily.  Problem # 3:  ACUTE ON CHRONIC DIASTOLIC HEART FAILURE (ICD-428.33) stable symptoms of fatigue and decreased exercise tolerance. continue current medical therapy  Patient Instructions: 1)  Your physician recommends that you schedule a follow-up appointment in: 8 weeks with Dr Johney Frame 2)  Your physician recommends that you continue on your current medications as directed. Please refer to the Current Medication list given to you today.

## 2010-07-28 NOTE — Letter (Signed)
Summary: [facility] Patient Note  [facility] Patient Note   Imported By: Roderic Ovens 01/12/2010 14:18:26  _____________________________________________________________________  External Attachment:    Type:   Image     Comment:   External Document

## 2010-07-28 NOTE — Progress Notes (Signed)
Summary: CALLING ABOUT HEART RATE IN THE 80-155  Phone Note Call from Patient Call back at Home Phone [phone]   Caller: Patient Summary of Call: PT CALLING ABOUT HEART RATE IN 80-155 DON'T FEEL GOOD WANT TO KNOW IF SHE NEEDS TO DO ANYTHING BEFORE HER APPT. Initial call taken by: Judie Grieve,  July 13, 2009 3:30 PM  Follow-up for Phone Call        called patient after discussing with Dr Johney Frame.  Will add pt on tomorrow at 10:00 if needed.  LMOM for pt with that information and to go ahead and take 240mg  of Cardizem if her HR continues to stay up.   Dennis Bast, RN, BSN  July 13, 2009 3:43 PM

## 2010-07-28 NOTE — Assessment & Plan Note (Signed)
Summary: eph   Visit Type:  Follow-up Primary Provider:  Iran Planas, MD   History of Present Illness: The patient presents today for electrophysiology followup after her recent hospitalization for long qt and torsades. She reports doing very well since hospital discharge.  She reports having atrial tachycardia  for 1 hour over the weekend with symptoms of palpitations.  She is unaware of any other arrhythmias.  The patient denies symptoms of  chest pain, shortness of breath, orthopnea, PND, lower extremity edema, dizziness, presyncope, syncope, or neurologic sequela. The patient is tolerating medications without difficulties and is otherwise without complaint today.   Current Medications (verified): 1)  Synthroid 125 Mcg Tabs (Levothyroxine Sodium) .... Take One Tablet By Mouth Once Daily. 2)  Arixtra 2.5 Mg/0.75ml Soln (Fondaparinux Sodium) .... Every Other Day 3)  Polyethylene Glycol 3350  Powd (Polyethylene Glycol 3350) .... Take 17 Grams Daily As Needed Constipation 4)  Valtrex 1 Gm Tabs (Valacyclovir Hcl) .... As Needed 5)  Amoxicillin 500 Mg Caps (Amoxicillin) .... **no Dose Specified** As Needed Dental Work 6)  Femring 0.05 Mg/24hr Ring (Estradiol Acetate) .... Daily 7)  Tylenol Extra Strength 500 Mg Tabs (Acetaminophen) .... As Needed 8)  Crestor 5 Mg Tabs (Rosuvastatin Calcium) .... Take One Tablet By Mouth Daily. 9)  Furosemide 20 Mg Tabs (Furosemide) .... As Needed 10)  Potassium Chloride Cr 10 Meq Cr-Caps (Potassium Chloride) .... Take One Tablet Every Other Day 11)  Mexiletine Hcl 150 Mg Caps (Mexiletine Hcl) .... Take One Capsule By Mouth Two Times A Day 12)  Vitamin D 2000 Unit Tabs (Cholecalciferol) .... Take One Tablet By Mouth Once Daily.  Allergies: 1)  ! * Vitamin K 2)  ! Heparin 3)  ! * Iv Contrast Dye 4)  ! Amiodarone Hcl  Past History:  Past Medical History:  1. Rheumatic heart disease status post percutaneous valvuloplasty at  [facility] in 1995 with subsequent  mechanical mitral valve replacement and tricuspid annuloplasty at [facility].   2. History of prior cerebrovascular accidents.   3. Subdural hematoma, on Coumadin.   4. Antiphospholipid antibody syndrome.   5. Heparin-induced thrombocytopenia.   6. Hypertension.   7. History of Hashimoto thyroiditis.   8.  Hyperlipidemia  9. 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 Mitral valve replacement and  tricuspid annuloplasty 2007 L atrial appendage amputated at that time Craniotomy for SDH 2008 TAH Cholecystecomty Tonsillectomy  Social History: Reviewed history from 07/26/2009 and no changes required.  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 Married   Review of Systems       All systems are reviewed and negative except as listed in the HPI.   Vital Signs:  Patient profile:   65 year old female Height:      63 inches Weight:      152 pounds BMI:     27.02 BP sitting:   178 / 100  (left arm)  Vitals Entered By: Laurance Flatten CMA (Nov 01, 2009 8:51 AM)  Physical Exam  General:  Well developed, well nourished, in no acute distress. Head:  normocephalic and atraumatic Eyes:  PERRLA/EOM intact; conjunctiva and lids normal. Mouth:  Teeth, gums and palate normal. Oral mucosa normal. Neck:  Neck supple, no JVD. No masses, thyromegaly or abnormal cervical nodes. Lungs:  Clear bilaterally to auscultation and percussion. Heart:  RRR with frequent ectopy, mechanical  S1. 2/6 SEM LLSB Abdomen:  Bowel sounds positive; abdomen soft and non-tender without masses, organomegaly, or hernias noted. No hepatosplenomegaly. Msk:  Back normal, normal gait. Muscle strength and tone normal. Pulses:  pulses normal in all 4 extremities Extremities:  No clubbing or cyanosis. Neurologic:  Alert and oriented x 3.  CNII-XII intact, strength/sensation  are intact Skin:  Intact without lesions or rashes. Cervical Nodes:  no significant adenopathy Psych:  Normal affect.   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   We will restart cardizem today. I have again recommended that she consider catheter ablation at [facility].  She is willing to consider this if her insurance will assist with an out of network consideration.  Problem # 3:  ESSENTIAL HYPERTENSION, BENIGN (ICD-401.1) stable restart cardizem check bmet  Other Orders: TLB-BMP (Basic Metabolic Panel-BMET) (80048-METABOL) TLB-TSH (Thyroid Stimulating Hormone) (84443-TSH)  Patient Instructions: 1)  Your physician recommends that you schedule a follow-up appointment in: 4-6 weeks with Dr Johney Frame 2)  Your physician has recommended you make the following change in your medication: stop Mexiletine and start Cardizem 240mg  daily 3)  Your physician recommends that you return for lab work today Prescriptions: CARDIZEM CD 240 MG XR24H-CAP (DILTIAZEM HCL COATED BEADS) one by mouth daily  #30 x 11   Entered by:   Dennis Bast, RN, BSN   Authorized by:   Hillis Range, MD   Signed by:   Dennis Bast, RN, BSN on 11/01/2009   Method used:   Faxed to ...       [facility] (retail)       [address]       [address]       [place], [place]  [address]       Ph: [phone]       Fax: [phone]   RxID:   [id number] CARDIZEM CD 240 MG XR24H-CAP (DILTIAZEM HCL COATED BEADS) one by mouth daily  #30 x 11   Entered by:   Dennis Bast, RN, BSN   Authorized by:   Hillis Range, MD   Signed by:    Dennis Bast, RN, BSN on 11/01/2009   Method used:   Print then Give to Patient   RxID:   [id number]

## 2010-07-28 NOTE — Progress Notes (Signed)
Summary: SWELLING ANKLES,SOB  Phone Note Call from Patient Call back at Home Phone [phone] Call back at [phone]   Caller: Patient Summary of Call: PT PUT ON WEIGHT SWELLING ANKLES AND SOB Initial call taken by: Judie Grieve,  June 28, 2009 10:44 AM  Follow-up for Phone Call        swelling better now.  But her exercise tolerance is way down  has an apt on 07/17/09 w Dr Johney Frame.  Is questions primary card of Dr Jens Som?  What do you think? Dennis Bast, RN, BSN  June 28, 2009 11:05 AM pt to come in on Fri and see Dr Johney Frame Dennis Bast, RN, BSN  June 28, 2009 5:19 PM

## 2010-07-28 NOTE — Progress Notes (Signed)
Summary: tachycardia  Phone Note Call from Patient Call back at [phone]   Caller: Patient Summary of Call: pt states she is still having tachycardia. she has she is asymptomatic. she will be in a class the rest of the afternoon. Please call husband cell [phone] Initial call taken by: Edman Circle,  July 07, 2009 12:57 PM  Follow-up for Phone Call        called and spoke with pt's husband asked him to have pt call me this afternoon after she gets out of class Dennis Bast, RN, BSN  July 07, 2009 1:32 PM Per Dr Johney Frame if HR is above 110 take 240mg  of Cardizem  if still up tomorrow will come in and have an EKG Dennis Bast, RN, BSN  July 07, 2009 4:22 PM  pt calling again, having the same problems, request to come in, call back @ [phone], Migdalia Dk  July 08, 2009 8:21 AM   Additional Follow-up for Phone Call Additional follow up Details #1::        pt came in for EKG on 07/08/09 will show to Dr Johney Frame on 07/14/09. Dennis Bast, RN, BSN  July 13, 2009 4:08 PM

## 2010-07-28 NOTE — Progress Notes (Signed)
Summary: pt would like a call.  Phone Note Call from Patient Call back at Home Phone [phone] Call back at [phone]   Caller: Patient Summary of Call: Pt request call  Initial call taken by: Judie Grieve,  January 21, 2010 3:28 PM  Follow-up for Phone Call        Called pt back.  Thinks  Dr Hurman Horn at [facility] can do her surgery to satisfy insurance.  Wants to know if this is an okay idea to go see him.  This is not what she wants to do if you don't think its a good idea.  She says that his office will see her and if he can do procedure he will if not he will send her to [facility].  This way her insurance will pay for [facility] if they refer her.  i told pt i would foward this to Dr Johney Frame Dennis Bast, RN, BSN  January 21, 2010 3:38 PM  Follow-up by: Hillis Range, MD,  January 24, 2010 9:21 AM  Additional Follow-up for Phone Call Additional follow up Details #1::        It would reasonable for her to consult with Dr Hurman Horn.  We will be happy to send records if she wishes. Additional Follow-up by: Hillis Range, MD,  January 24, 2010 9:22 AM    Additional Follow-up for Phone Call Additional follow up Details #2::    lmom for pt in regards to going to doctor in [place].  Will wait and hear from her in regards to what to do as far as referall Dennis Bast, RN, BSN  February 07, 2010 2:58 PM

## 2010-07-28 NOTE — Letter (Signed)
Summary: Discharge Summary - [facility]  Discharge Summary - [facility]   Imported By: Marylou Mccoy 07/29/2009 12:52:19  _____________________________________________________________________  External Attachment:    Type:   Image     Comment:   External Document

## 2010-07-28 NOTE — Assessment & Plan Note (Signed)
Summary: rov per pt call increased HR/kl   Visit Type:  Follow-up Primary Provider:  Iran Planas, MD   History of Present Illness: The patient presents today for routine electrophysiology followup. She has developed recurrent atrial tachycardia since last being seen in our clinic.  She feels that her heart rates are typically controlled but notices that they significantly increase with activity producing fatigue and significant decline in exercise tolerance.  The patient denies symptoms of chest pain, shortness of breath, orthopnea, PND, lower extremity edema, dizziness, presyncope, syncope, or neurologic sequela. The patient is tolerating medications without difficulties and is otherwise without complaint today.   Current Medications (verified): 1)  Multaq 400 Mg Tabs (Dronedarone Hcl) .... Take One Tablet Two Times A Day 2)  Cardizem Cd 120 Mg Xr24h-Cap (Diltiazem Hcl Coated Beads) .... One Capsule Once Two Times A Day 3)  Levothroid 88 Mcg Tabs (Levothyroxine Sodium) .... Take One Tablet Once Daily 4)  Keppra 500 Mg Tabs (Levetiracetam) .... 2 Tabs Two Times A Day 5)  Vitamin D 2000 U .... Daily 6)  Arixtra 2.5 Mg/0.44ml Soln (Fondaparinux Sodium) .... Every Other Day 7)  Polyethylene Glycol 3350  Powd (Polyethylene Glycol 3350) .... Take 17 Grams Daily As Needed Constipation 8)  Valtrex 1 Gm Tabs (Valacyclovir Hcl) .... Three Doses As Needed Labial Herpes 9)  Amoxicillin 500 Mg Caps (Amoxicillin) .... **no Dose Specified** As Needed Dental Work 10)  Femring 0.05 Mg/24hr Ring (Estradiol Acetate) .... Daily 11)  Tylenol Extra Strength 500 Mg Tabs (Acetaminophen) .... As Needed 12)  Align  Caps (Probiotic Product) .... Daily  Allergies: 1)  ! * Vitamin K 2)  ! Heparin 3)  ! * Iv Contrast Dye  Past History:  Past Medical History: Reviewed history from 07/02/2009 and no changes required.  1. Rheumatic heart disease status post percutaneous valvuloplasty at  [facility] in 1995 with subsequent  mechanical mitral valve replacement at [facility].   2. History of prior cerebrovascular accidents.   3. Subdural hematoma, on Coumadin.   4. Antiphospholipid antibody syndrome.   5. Heparin-induced thrombocytopenia.   6. Hypertension.   7. History of Hashimoto thyroiditis.   8. Prior tricuspid annuloplasty  Past Surgical History: Reviewed history from 07/02/2009 and no changes required. Mitral valve replacement and  tricuspid annuloplasty 2007 Craniotomy for SDH 2008 TAH Cholecystecomty  Social History: Reviewed history from 07/02/2009 and no changes required.  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  Review of Systems       All systems are reviewed and negative except as listed in the HPI.   Vital Signs:  Patient profile:   65 year old female Height:      63 inches Weight:      166 pounds BMI:     29.51 Pulse rate:   76 / minute BP sitting:   102 / 80  (left arm)  Vitals Entered By: Laurance Flatten CMA (July 14, 2009 9:25 AM)  Physical Exam  General:  Well developed, well nourished, in no acute distress. Head:  normocephalic and atraumatic Eyes:  PERRLA/EOM intact; conjunctiva and lids normal. Mouth:  Teeth, gums and palate normal. Oral mucosa normal. Neck:  Neck supple, no JVD. No masses, thyromegaly or abnormal cervical nodes. Lungs:  Clear bilaterally to auscultation and percussion. Heart:  RRR with frequent ectopy, mechanical S1. 2/6 SEM LLSB Abdomen:  Bowel sounds positive; abdomen soft and non-tender without masses, organomegaly, or hernias  noted. No hepatosplenomegaly. Msk:  Back normal, normal gait. Muscle strength and tone normal. Pulses:  pulses normal in all 4 extremities Extremities:  No clubbing or cyanosis. Neurologic:  Alert and oriented x 3.  CNII-XII intact, strength/sensation are intact   Echocardiogram  Procedure date:  06/03/2009  Findings:        Study Conclusions    -  Left ventricle: The cavity size was normal. Systolic function was     normal. The estimated ejection fraction was in the range of 50% to     55%. Doppler parameters are consistent with a reversible     restrictive pattern, indicative of decreased left ventricular     diastolic compliance and/or increased left atrial pressure (grade     3 diastolic dysfunction).   - Aortic valve: Trivial regurgitation.   - Mitral valve: A prosthesis was present and functioning normally.     The prosthesis had a normal range of motion. The sewing ring     appeared normal, had no rocking motion, and showed no evidence of     dehiscence.   - Left atrium: The atrium was moderately dilated.   - Right atrium: The atrium was mildly dilated.   Transthoracic echocardiography. M-mode, complete 2D, spectral   Doppler, and color Doppler. Height: Height: 160cm. Height: 63in.   Weight: Weight: 70.5kg. Weight: 155lb. Body mass index: BMI:   27.5kg/m^2. Body surface area: BSA: 1.71m^2. Blood pressure: 120/70.   Patient status: Inpatient. Location: Bedside.   Echocardiogram  Procedure date:  07/26/2007  Findings:        SUMMARY   -  Overall left ventricular systolic function was at the lower         limits of normal. Left ventricular ejection fraction was         estimated to be 55 %. There was no diagnostic evidence of         left ventricular regional wall motion abnormalities. There         was moderate paradoxical motion of the interventricular         septum, consistent with RV-LV interaction.   -  No evidence of thrombosis There was a bileaflet mechanical mitral         valve prosthesis. The sewing ring of the mitral prosthesis         appeared normal. The prosthetic mitral valve leaflets         appeared normal. There was no significant perivalvular mitral         regurgitation. Mean transmitral gradient was 3.5 mmHg. Mitral         valve area by pressure half-time was 3.44 cm^2.   -  The left atrium was  mildly dilated.   -  The right ventricle was dilated. The estimated peak right         ventricular systolic pressure was mildly increased.   -  Estimated peak pulmonary artery systolic pressure was 35 mmHg.   -  The right atrium was mildly dilated.   -  The inferior vena cava was mildly dilated.     ---------------------------------------------------------------    Prepared and Electronically Authenticated by    Verdis Prime M.D.       EKG  Procedure date:  07/14/2009  Findings:      atrial tachycardia with 2:1 av conduction,  IVCD  Impression & Recommendations:  Problem # 1:  ATRIAL TACHYCARDIA (ICD-427.89) The patient has SVT, likely an atrial tachycardia, despite medical therapy with  multaq. At this point, she is very symptomatic with elevated heart rates.  We will increase diltiazem today and continue multaq for rate control. We will consider flecainide as an alternative antiarrhythmic depending on her response to rate control. We could also consider catheter ablation, though given her risks for bleeding and thrombosis, I am less inclined to do so. I have offered tertiary referral to [facility] or [place], though the patient declines referral at this time.   The following medications were removed from the medication list:    Lisinopril 5 Mg Tabs (Lisinopril) .Marland Kitchen... Take one tablet once daily    Cardizem Cd 240 Mg Xr24h-cap (Diltiazem hcl coated beads) .Marland Kitchen... Take 1/2 tablet daily Her updated medication list for this problem includes:    Multaq 400 Mg Tabs (Dronedarone hcl) .Marland Kitchen... Take one tablet two times a day    Cardizem Cd 240 Mg Xr24h-cap (Diltiazem hcl coated beads) ..... One by mouth daily    Arixtra 2.5 Mg/0.60ml Soln (Fondaparinux sodium) ..... Every other day  Problem # 2:  CHRONIC KIDNEY DISEASE UNSPECIFIED (ICD-585.9) worsening renal failure limits our antiarrhythmic options.  Problem # 3:  ACUTE ON CHRONIC DIASTOLIC HEART FAILURE (ICD-428.33) She will initiate care  with Dr Jens Som in the next few weeks. His ability to treat her valvular heart disease may be limited until we can take care of her arrhythmias.  The following medications were removed from the medication list:    Lisinopril 5 Mg Tabs (Lisinopril) .Marland Kitchen... Take one tablet once daily    Cardizem Cd 240 Mg Xr24h-cap (Diltiazem hcl coated beads) .Marland Kitchen... Take 1/2 tablet daily Her updated medication list for this problem includes:    Multaq 400 Mg Tabs (Dronedarone hcl) .Marland Kitchen... Take one tablet two times a day    Cardizem Cd 240 Mg Xr24h-cap (Diltiazem hcl coated beads) ..... One by mouth daily    Arixtra 2.5 Mg/0.17ml Soln (Fondaparinux sodium) ..... Every other day  Patient Instructions: 1)  Your physician has recommended you make the following change in your medication: continue with Cardizem 240mg  daily and keep scheduled appointment with Dr Johney Frame Prescriptions: CARDIZEM CD 240 MG XR24H-CAP (DILTIAZEM HCL COATED BEADS) one by mouth daily  #32 x 11   Entered by:   Dennis Bast, RN, BSN   Authorized by:   Hillis Range, MD   Signed by:   Dennis Bast, RN, BSN on 07/14/2009   Method used:   Faxed to ...       [facility] (retail)       [address] [place]       [address]       [place], [place]  [address]       Ph: [phone]       Fax: [phone]   RxID:   [id number]

## 2010-07-28 NOTE — Assessment & Plan Note (Signed)
Summary: 4-6 week f/u sl   Visit Type:  Follow-up Primary Provider:  Iran Planas, MD   History of Present Illness: The patient presents today for electrophysiology followup. She reports doing very well since last being seen in our clinic.  She reports occasional episodes of palpitations but is not clear as to how much sustained tachycardia she may be having.    The patient denies symptoms of  chest pain, shortness of breath, orthopnea, PND, lower extremity edema, dizziness, presyncope, syncope, or neurologic sequela. The patient is tolerating medications without difficulties and is otherwise without complaint today.   Current Medications (verified): 1)  Synthroid 88 Mcg Tabs (Levothyroxine Sodium) 2)  Arixtra 2.5 Mg/0.50ml Soln (Fondaparinux Sodium) .... Every Other Day 3)  Polyethylene Glycol 3350  Powd (Polyethylene Glycol 3350) .... Take 17 Grams Daily As Needed Constipation 4)  Valtrex 1 Gm Tabs (Valacyclovir Hcl) .... As Needed 5)  Amoxicillin 500 Mg Caps (Amoxicillin) .... **no Dose Specified** As Needed Dental Work 6)  Femring 0.05 Mg/24hr Ring (Estradiol Acetate) .... Daily 7)  Tylenol Extra Strength 500 Mg Tabs (Acetaminophen) .... As Needed 8)  Crestor 5 Mg Tabs (Rosuvastatin Calcium) .... Take One Tablet By Mouth Daily. 9)  Furosemide 20 Mg Tabs (Furosemide) .... As Needed 10)  Potassium Chloride Cr 10 Meq Cr-Caps (Potassium Chloride) .... Take One Tablet Every Other Day 11)  Vitamin D 2000 Unit Tabs (Cholecalciferol) .... Take One Tablet By Mouth Once Daily. 12)  Cardizem Cd 240 Mg Xr24h-Cap (Diltiazem Hcl Coated Beads) .... One By Mouth Daily 13)  Calcium-Magnesium .... Once Daily  Allergies (verified): 1)  ! * Vitamin K 2)  ! Heparin 3)  ! * Iv Contrast Dye 4)  ! Amiodarone Hcl  Past History:  Past Medical History: Reviewed history from 11/01/2009 and no changes required.  1. Rheumatic heart disease status post percutaneous valvuloplasty at  [facility] in 1995 with  subsequent mechanical mitral valve replacement and tricuspid annuloplasty at [facility].   2. History of prior cerebrovascular accidents.   3. Subdural hematoma, on Coumadin.   4. Antiphospholipid antibody syndrome.   5. Heparin-induced thrombocytopenia.   6. Hypertension.   7. History of Hashimoto thyroiditis.   8.  Hyperlipidemia  9. 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 Mitral valve replacement and  tricuspid annuloplasty 2007 L atrial appendage amputated at that time Craniotomy for SDH 2008 TAH Cholecystecomty Tonsillectomy  Social History: Reviewed history from 07/26/2009 and no changes required.  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 Married   Review of Systems       All systems are reviewed and negative except as listed in the HPI.   Vital Signs:  Patient profile:   65 year old female Height:      63 inches Weight:      149 pounds BMI:     26.49 Pulse rate:   53 / minute BP sitting:   140 / 96  (left arm)  Vitals Entered By: Laurance Flatten CMA (December 16, 2009 11:38 AM)  Physical Exam  General:  Well developed, well nourished, in no acute distress. Head:  normocephalic and atraumatic Mouth:  Teeth, gums and palate normal. Oral mucosa normal. Neck:  Neck supple, no JVD. No masses, thyromegaly or abnormal cervical nodes. Lungs:  Clear bilaterally to auscultation and percussion. Heart:  RRR with frequent ectopy, mechanical S1. 2/6  SEM LLSB Abdomen:  Bowel sounds positive; abdomen soft and non-tender without masses, organomegaly, or hernias noted. No hepatosplenomegaly. Msk:  Back normal, normal gait. Muscle strength and tone normal. Pulses:  pulses normal in all 4 extremities Extremities:  No clubbing or cyanosis. Neurologic:  Alert and oriented x 3.  CNII-XII intact, strength/sensation are intact Skin:   Intact without lesions or rashes.   EKG  Procedure date:  12/16/2009  Findings:      sinus bradycardia 53 bpm, incomplete LBBB,  QTc 472  Impression & Recommendations:  Problem # 1:  ATRIAL TACHYCARDIA (ICD-427.89) doing well, maintaining sinus rhythm continue arixtra and cardizem  Problem # 2:  ESSENTIAL HYPERTENSION, BENIGN (ICD-401.1) above goal given bradycardia, I would not increase cardizem at this time I have recommended that we restart Lisinopril 5mg  daily but have instructed the patient to discuss this with Dr Eliott Nine first.  She would need close creatinine follow-up by Dr Eliott Nine  Problem # 3:  LONG QT SYNDROME (ICD-759.89) resolving off amiodarone  Patient Instructions: 1)  Your physician recommends that you schedule a follow-up appointment in: 3 months with Dr Johney Frame

## 2010-07-28 NOTE — Cardiovascular Report (Signed)
Summary: Cardiac Cath Other - [facility]  Cardiac Cath Other - [facility]   Imported By: Marylou Mccoy 07/29/2009 12:57:04  _____________________________________________________________________  External Attachment:    Type:   Image     Comment:   External Document

## 2010-07-28 NOTE — Progress Notes (Signed)
Summary: PT HEART BEATING HARD  Phone Note Call from Patient Call back at Home Phone [phone]   Caller: Patient Summary of Call: PT CALLING BECAUSE HEART  BEATING HARD,WOULD LIKE A CALL TODAY.  Follow-up for Phone Call        she missed her pm dose of Multaq and now if she is in bed her HR is ok but if she gets up and moves around her HR goes up.  She will go ahead and take her pm dose of Cardizem and will eat early and take pm dose of Multaq earlier than usual Dennis Bast, RN, BSN  September 08, 2009 3:15 PM

## 2010-08-03 NOTE — Letter (Signed)
Summary: [facility]  [facility]   Imported By: Marylou Mccoy 07/22/2010 10:15:57  _____________________________________________________________________  External Attachment:    Type:   Image     Comment:   External Document

## 2010-08-17 NOTE — Letter (Signed)
Summary: [facility] Patient Note   [facility] Patient Note   Imported By: Roderic Ovens 08/08/2010 12:06:10  _____________________________________________________________________  External Attachment:    Type:   Image     Comment:   External Document

## 2010-09-01 NOTE — Consult Note (Signed)
Summary: Consultation Report  Consultation Report   Imported By: Earl Many 08/22/2010 16:53:42  _____________________________________________________________________  External Attachment:    Type:   Image     Comment:   External Document

## 2010-09-01 NOTE — Letter (Signed)
Summary: [facility]  [facility]   Imported By: Earl Many 08/22/2010 16:58:32  _____________________________________________________________________  External Attachment:    Type:   Image     Comment:   External Document

## 2010-09-14 LAB — BASIC METABOLIC PANEL
BUN: 17 mg/dL (ref 6–23)
BUN: 18 mg/dL (ref 6–23)
BUN: 19 mg/dL (ref 6–23)
BUN: 21 mg/dL (ref 6–23)
BUN: 22 mg/dL (ref 6–23)
CO2: 23 mEq/L (ref 19–32)
CO2: 25 mEq/L (ref 19–32)
CO2: 27 mEq/L (ref 19–32)
CO2: 28 mEq/L (ref 19–32)
Calcium: 8 mg/dL — ABNORMAL LOW (ref 8.4–10.5)
Calcium: 8.1 mg/dL — ABNORMAL LOW (ref 8.4–10.5)
Calcium: 8.4 mg/dL (ref 8.4–10.5)
Calcium: 8.6 mg/dL (ref 8.4–10.5)
Chloride: 105 mEq/L (ref 96–112)
Chloride: 107 mEq/L (ref 96–112)
Chloride: 108 mEq/L (ref 96–112)
Chloride: 108 mEq/L (ref 96–112)
Chloride: 108 mEq/L (ref 96–112)
Creatinine, Ser: 1.72 mg/dL — ABNORMAL HIGH (ref 0.4–1.2)
Creatinine, Ser: 1.73 mg/dL — ABNORMAL HIGH (ref 0.4–1.2)
Creatinine, Ser: 1.73 mg/dL — ABNORMAL HIGH (ref 0.4–1.2)
Creatinine, Ser: 1.82 mg/dL — ABNORMAL HIGH (ref 0.4–1.2)
Creatinine, Ser: 1.9 mg/dL — ABNORMAL HIGH (ref 0.4–1.2)
Creatinine, Ser: 1.99 mg/dL — ABNORMAL HIGH (ref 0.4–1.2)
GFR calc Af Amer: 30 mL/min — ABNORMAL LOW (ref >60)
GFR calc Af Amer: 31 mL/min — ABNORMAL LOW (ref >60)
GFR calc Af Amer: 34 mL/min — ABNORMAL LOW (ref >60)
GFR calc Af Amer: 35 mL/min — ABNORMAL LOW (ref >60)
GFR calc Af Amer: 36 mL/min — ABNORMAL LOW (ref >60)
GFR calc Af Amer: 36 mL/min — ABNORMAL LOW (ref >60)
GFR calc Af Amer: 36 mL/min — ABNORMAL LOW (ref >60)
GFR calc Af Amer: 36 mL/min — ABNORMAL LOW (ref >60)
GFR calc non Af Amer: 24 mL/min — ABNORMAL LOW (ref >60)
GFR calc non Af Amer: 25 mL/min — ABNORMAL LOW (ref >60)
GFR calc non Af Amer: 27 mL/min — ABNORMAL LOW (ref >60)
GFR calc non Af Amer: 29 mL/min — ABNORMAL LOW (ref >60)
GFR calc non Af Amer: 30 mL/min — ABNORMAL LOW (ref >60)
GFR calc non Af Amer: 30 mL/min — ABNORMAL LOW (ref >60)
GFR calc non Af Amer: 30 mL/min — ABNORMAL LOW (ref >60)
Glucose, Bld: 120 mg/dL — ABNORMAL HIGH (ref 70–99)
Glucose, Bld: 93 mg/dL (ref 70–99)
Glucose, Bld: 97 mg/dL (ref 70–99)
Potassium: 3.6 mEq/L (ref 3.5–5.1)
Potassium: 3.9 mEq/L (ref 3.5–5.1)
Potassium: 4.1 mEq/L (ref 3.5–5.1)
Potassium: 4.2 mEq/L (ref 3.5–5.1)
Potassium: 4.2 mEq/L (ref 3.5–5.1)
Sodium: 133 mEq/L — ABNORMAL LOW (ref 135–145)
Sodium: 139 mEq/L (ref 135–145)
Sodium: 139 mEq/L (ref 135–145)
Sodium: 140 mEq/L (ref 135–145)
Sodium: 141 mEq/L (ref 135–145)

## 2010-09-14 LAB — MRSA PCR SCREENING: MRSA by PCR: NEGATIVE

## 2010-09-14 LAB — COMPREHENSIVE METABOLIC PANEL
Alkaline Phosphatase: 81 U/L (ref 39–117)
BUN: 26 mg/dL — ABNORMAL HIGH (ref 6–23)
CO2: 28 mEq/L (ref 19–32)
GFR calc non Af Amer: 27 mL/min — ABNORMAL LOW (ref >60)
Glucose, Bld: 122 mg/dL — ABNORMAL HIGH (ref 70–99)
Potassium: 3.2 mEq/L — ABNORMAL LOW (ref 3.5–5.1)
Total Protein: 6.5 g/dL (ref 6.0–8.3)

## 2010-09-14 LAB — PHOSPHORUS: Phosphorus: 3.6 mg/dL (ref 2.3–4.6)

## 2010-09-14 LAB — CBC
Hemoglobin: 10.3 g/dL — ABNORMAL LOW (ref 12.0–15.0)
MCHC: 34.1 g/dL (ref 30.0–36.0)
MCV: 95.4 fL (ref 78.0–100.0)
Platelets: 91 10*3/uL — ABNORMAL LOW (ref 150–400)
RBC: 3.04 MIL/uL — ABNORMAL LOW (ref 3.87–5.11)
RDW: 13.3 % (ref 11.5–15.5)
WBC: 4.7 10*3/uL (ref 4.0–10.5)

## 2010-09-14 LAB — DIFFERENTIAL
Basophils Relative: 0 % (ref 0–1)
Lymphocytes Relative: 8 % — ABNORMAL LOW (ref 12–46)
Lymphs Abs: 0.6 10*3/uL — ABNORMAL LOW (ref 0.7–4.0)
Monocytes Absolute: 0.3 10*3/uL (ref 0.1–1.0)
Monocytes Relative: 4 % (ref 3–12)
Neutro Abs: 6.2 10*3/uL (ref 1.7–7.7)

## 2010-09-14 LAB — CARDIAC PANEL(CRET KIN+CKTOT+MB+TROPI)
CK, MB: 0.9 ng/mL (ref 0.3–4.0)
CK, MB: 1 ng/mL (ref 0.3–4.0)
Relative Index: INVALID (ref 0.0–2.5)
Total CK: 30 U/L (ref 7–177)
Total CK: 36 U/L (ref 7–177)
Troponin I: 0.01 ng/mL (ref 0.00–0.06)
Troponin I: 0.01 ng/mL (ref 0.00–0.06)
Troponin I: 0.02 ng/mL (ref 0.00–0.06)

## 2010-09-14 LAB — CK TOTAL AND CKMB (NOT AT ARMC)
Relative Index: INVALID (ref 0.0–2.5)
Total CK: 44 U/L (ref 7–177)

## 2010-09-14 LAB — MAGNESIUM: Magnesium: 2.2 mg/dL (ref 1.5–2.5)

## 2010-09-14 LAB — TSH: TSH: 13.288 u[IU]/mL — ABNORMAL HIGH (ref 0.350–4.500)

## 2010-09-14 LAB — BRAIN NATRIURETIC PEPTIDE: Pro B Natriuretic peptide (BNP): 443 pg/mL — ABNORMAL HIGH (ref 0.0–100.0)

## 2010-09-18 LAB — BASIC METABOLIC PANEL
CO2: 26 mEq/L (ref 19–32)
Chloride: 103 mEq/L (ref 96–112)
Chloride: 105 mEq/L (ref 96–112)
Creatinine, Ser: 2.06 mg/dL — ABNORMAL HIGH (ref 0.4–1.2)
GFR calc Af Amer: 29 mL/min — ABNORMAL LOW (ref >60)
GFR calc Af Amer: 31 mL/min — ABNORMAL LOW (ref >60)
GFR calc non Af Amer: 23 mL/min — ABNORMAL LOW (ref >60)
Glucose, Bld: 124 mg/dL — ABNORMAL HIGH (ref 70–99)
Potassium: 3.5 mEq/L (ref 3.5–5.1)
Potassium: 3.6 mEq/L (ref 3.5–5.1)
Sodium: 137 mEq/L (ref 135–145)
Sodium: 139 mEq/L (ref 135–145)

## 2010-09-18 LAB — HEPATIC FUNCTION PANEL
ALT: 38 U/L — ABNORMAL HIGH (ref 0–35)
AST: 39 U/L — ABNORMAL HIGH (ref 0–37)
Albumin: 3.5 g/dL (ref 3.5–5.2)
Alkaline Phosphatase: 90 U/L (ref 39–117)
Total Bilirubin: 0.8 mg/dL (ref 0.3–1.2)

## 2010-09-18 LAB — POCT CARDIAC MARKERS
CKMB, poc: 1.1 ng/mL (ref 1.0–8.0)
Myoglobin, poc: 98 ng/mL (ref 12–200)

## 2010-09-18 LAB — CK TOTAL AND CKMB (NOT AT ARMC)
Relative Index: INVALID (ref 0.0–2.5)
Total CK: 59 U/L (ref 7–177)

## 2010-09-18 LAB — HEMOGLOBIN A1C: Mean Plasma Glucose: 111 mg/dL

## 2010-09-18 LAB — POCT I-STAT, CHEM 8
BUN: 30 mg/dL — ABNORMAL HIGH (ref 6–23)
Creatinine, Ser: 2.4 mg/dL — ABNORMAL HIGH (ref 0.4–1.2)
Hemoglobin: 10.5 g/dL — ABNORMAL LOW (ref 12.0–15.0)
Potassium: 3.9 mEq/L (ref 3.5–5.1)
Sodium: 140 mEq/L (ref 135–145)

## 2010-09-18 LAB — DIFFERENTIAL
Lymphocytes Relative: 6 % — ABNORMAL LOW (ref 12–46)
Lymphs Abs: 0.6 10*3/uL — ABNORMAL LOW (ref 0.7–4.0)
Monocytes Relative: 3 % (ref 3–12)
Neutrophils Relative %: 89 % — ABNORMAL HIGH (ref 43–77)

## 2010-09-18 LAB — CBC
Platelets: 96 10*3/uL — ABNORMAL LOW (ref 150–400)
RBC: 3.23 MIL/uL — ABNORMAL LOW (ref 3.87–5.11)
WBC: 9.4 10*3/uL (ref 4.0–10.5)

## 2010-09-18 LAB — BRAIN NATRIURETIC PEPTIDE: Pro B Natriuretic peptide (BNP): 642 pg/mL — ABNORMAL HIGH (ref 0.0–100.0)

## 2010-09-18 LAB — HEPARIN ANTI-XA: Heparin LMW: 0.92 IU/mL

## 2010-09-21 ENCOUNTER — Encounter (HOSPITAL_BASED_OUTPATIENT_CLINIC_OR_DEPARTMENT_OTHER): Payer: Medicare PPO | Admitting: Oncology

## 2010-09-21 ENCOUNTER — Other Ambulatory Visit: Payer: Self-pay | Admitting: Oncology

## 2010-09-21 DIAGNOSIS — R894 Abnormal immunological findings in specimens from other organs, systems and tissues: Secondary | ICD-10-CM

## 2010-09-21 DIAGNOSIS — D6859 Other primary thrombophilia: Secondary | ICD-10-CM

## 2010-09-21 DIAGNOSIS — N189 Chronic kidney disease, unspecified: Secondary | ICD-10-CM

## 2010-09-21 LAB — CBC WITH DIFFERENTIAL/PLATELET
Basophils Absolute: 0.1 10*3/uL (ref 0.0–0.1)
HCT: 31.5 % — ABNORMAL LOW (ref 34.8–46.6)
HGB: 10.6 g/dL — ABNORMAL LOW (ref 11.6–15.9)
MONO#: 0.3 10*3/uL (ref 0.1–0.9)
NEUT#: 3 10*3/uL (ref 1.5–6.5)
NEUT%: 65.5 % (ref 38.4–76.8)
WBC: 4.6 10*3/uL (ref 3.9–10.3)
lymph#: 0.9 10*3/uL (ref 0.9–3.3)

## 2010-09-21 LAB — BASIC METABOLIC PANEL
BUN: 24 mg/dL — ABNORMAL HIGH (ref 6–23)
CO2: 24 mEq/L (ref 19–32)
Chloride: 105 mEq/L (ref 96–112)
Creatinine, Ser: 1.52 mg/dL — ABNORMAL HIGH (ref 0.40–1.20)
Potassium: 4.2 mEq/L (ref 3.5–5.3)

## 2010-09-27 LAB — COMPREHENSIVE METABOLIC PANEL
ALT: 25 U/L (ref 0–35)
AST: 27 U/L (ref 0–37)
AST: 29 U/L (ref 0–37)
Alkaline Phosphatase: 87 U/L (ref 39–117)
BUN: 29 mg/dL — ABNORMAL HIGH (ref 6–23)
CO2: 24 mEq/L (ref 19–32)
CO2: 26 mEq/L (ref 19–32)
Calcium: 8.6 mg/dL (ref 8.4–10.5)
Chloride: 104 mEq/L (ref 96–112)
Creatinine, Ser: 1.66 mg/dL — ABNORMAL HIGH (ref 0.4–1.2)
GFR calc Af Amer: 38 mL/min — ABNORMAL LOW (ref >60)
GFR calc Af Amer: 35 mL/min — ABNORMAL LOW (ref >60)
GFR calc non Af Amer: 31 mL/min — ABNORMAL LOW (ref >60)
GFR calc non Af Amer: 29 mL/min — ABNORMAL LOW (ref >60)
Sodium: 138 mEq/L (ref 135–145)
Total Bilirubin: 0.5 mg/dL (ref 0.3–1.2)
Total Bilirubin: 0.4 mg/dL (ref 0.3–1.2)

## 2010-09-27 LAB — APTT: aPTT: 41 seconds — ABNORMAL HIGH (ref 24–37)

## 2010-09-27 LAB — POCT CARDIAC MARKERS

## 2010-09-27 LAB — DIFFERENTIAL
Basophils Absolute: 0 10*3/uL (ref 0.0–0.1)
Basophils Absolute: 0 10*3/uL (ref 0.0–0.1)
Basophils Absolute: 0 10*3/uL (ref 0.0–0.1)
Basophils Absolute: 0 10*3/uL (ref 0.0–0.1)
Basophils Relative: 0 % (ref 0–1)
Basophils Relative: 1 % (ref 0–1)
Basophils Relative: 1 % (ref 0–1)
Basophils Relative: 1 % (ref 0–1)
Eosinophils Absolute: 0.2 10*3/uL (ref 0.0–0.7)
Eosinophils Absolute: 0.2 10*3/uL (ref 0.0–0.7)
Eosinophils Absolute: 0.2 10*3/uL (ref 0.0–0.7)
Eosinophils Absolute: 0.2 10*3/uL (ref 0.0–0.7)
Eosinophils Relative: 5 % (ref 0–5)
Eosinophils Relative: 5 % (ref 0–5)
Eosinophils Relative: 4 % (ref 0–5)
Lymphocytes Relative: 25 % (ref 12–46)
Lymphocytes Relative: 29 % (ref 12–46)
Lymphs Abs: 1.1 10*3/uL (ref 0.7–4.0)
Lymphs Abs: 1.3 10*3/uL (ref 0.7–4.0)
Lymphs Abs: 1.3 10*3/uL (ref 0.7–4.0)
Lymphs Abs: 1.2 10*3/uL (ref 0.7–4.0)
Monocytes Absolute: 0.3 10*3/uL (ref 0.1–1.0)
Monocytes Absolute: 0.4 10*3/uL (ref 0.1–1.0)
Monocytes Relative: 8 % (ref 3–12)
Monocytes Relative: 8 % (ref 3–12)
Neutro Abs: 1.8 10*3/uL (ref 1.7–7.7)
Neutro Abs: 2.5 10*3/uL (ref 1.7–7.7)
Neutro Abs: 2.8 10*3/uL (ref 1.7–7.7)
Neutro Abs: 2.8 10*3/uL (ref 1.7–7.7)
Neutrophils Relative %: 50 % (ref 43–77)
Neutrophils Relative %: 57 % (ref 43–77)
Neutrophils Relative %: 57 % (ref 43–77)

## 2010-09-27 LAB — BASIC METABOLIC PANEL
BUN: 35 mg/dL — ABNORMAL HIGH (ref 6–23)
CO2: 22 mEq/L (ref 19–32)
CO2: 23 mEq/L (ref 19–32)
Calcium: 8.7 mg/dL (ref 8.4–10.5)
Chloride: 110 mEq/L (ref 96–112)
Chloride: 113 mEq/L — ABNORMAL HIGH (ref 96–112)
Creatinine, Ser: 1.61 mg/dL — ABNORMAL HIGH (ref 0.4–1.2)
Creatinine, Ser: 2.27 mg/dL — ABNORMAL HIGH (ref 0.4–1.2)
Glucose, Bld: 105 mg/dL — ABNORMAL HIGH (ref 70–99)
Glucose, Bld: 89 mg/dL (ref 70–99)
Potassium: 4.5 mEq/L (ref 3.5–5.1)

## 2010-09-27 LAB — CBC
HCT: 27.8 % — ABNORMAL LOW (ref 36.0–46.0)
HCT: 28.6 % — ABNORMAL LOW (ref 36.0–46.0)
HCT: 32.1 % — ABNORMAL LOW (ref 36.0–46.0)
Hemoglobin: 10.7 g/dL — ABNORMAL LOW (ref 12.0–15.0)
Hemoglobin: 9.9 g/dL — ABNORMAL LOW (ref 12.0–15.0)
MCHC: 34.1 g/dL (ref 30.0–36.0)
MCHC: 34.2 g/dL (ref 30.0–36.0)
MCHC: 34.7 g/dL (ref 30.0–36.0)
MCHC: 34.8 g/dL (ref 30.0–36.0)
MCHC: 35 g/dL (ref 30.0–36.0)
MCV: 92.7 fL (ref 78.0–100.0)
MCV: 93.2 fL (ref 78.0–100.0)
MCV: 93.6 fL (ref 78.0–100.0)
MCV: 94 fL (ref 78.0–100.0)
Platelets: 68 10*3/uL — ABNORMAL LOW (ref 150–400)
Platelets: 73 10*3/uL — ABNORMAL LOW (ref 150–400)
Platelets: 79 10*3/uL — ABNORMAL LOW (ref 150–400)
RBC: 3.07 MIL/uL — ABNORMAL LOW (ref 3.87–5.11)
RBC: 3.15 MIL/uL — ABNORMAL LOW (ref 3.87–5.11)
RBC: 3.34 MIL/uL — ABNORMAL LOW (ref 3.87–5.11)
RBC: 3.42 MIL/uL — ABNORMAL LOW (ref 3.87–5.11)
RBC: 3.47 MIL/uL — ABNORMAL LOW (ref 3.87–5.11)
RDW: 12.8 % (ref 11.5–15.5)
RDW: 13 % (ref 11.5–15.5)
WBC: 4 10*3/uL (ref 4.0–10.5)
WBC: 4.4 10*3/uL (ref 4.0–10.5)
WBC: 4.5 10*3/uL (ref 4.0–10.5)
WBC: 4.8 10*3/uL (ref 4.0–10.5)

## 2010-09-27 LAB — HEPARIN ANTI-XA: Heparin LMW: 0.93 IU/mL

## 2010-09-27 LAB — D-DIMER, QUANTITATIVE: D-Dimer, Quant: 1.24 ug/mL-FEU — ABNORMAL HIGH (ref 0.00–0.48)

## 2010-09-27 LAB — BRAIN NATRIURETIC PEPTIDE: Pro B Natriuretic peptide (BNP): 702 pg/mL — ABNORMAL HIGH (ref 0.0–100.0)

## 2010-10-04 ENCOUNTER — Encounter: Payer: Self-pay | Admitting: Internal Medicine

## 2010-10-10 ENCOUNTER — Ambulatory Visit (INDEPENDENT_AMBULATORY_CARE_PROVIDER_SITE_OTHER): Payer: Medicare PPO | Admitting: Internal Medicine

## 2010-10-10 ENCOUNTER — Encounter: Payer: Self-pay | Admitting: Internal Medicine

## 2010-10-10 VITALS — BP 120/70 | HR 53 | Ht 63.0 in | Wt 150.0 lb

## 2010-10-10 DIAGNOSIS — I509 Heart failure, unspecified: Secondary | ICD-10-CM

## 2010-10-10 DIAGNOSIS — I10 Essential (primary) hypertension: Secondary | ICD-10-CM

## 2010-10-10 DIAGNOSIS — N189 Chronic kidney disease, unspecified: Secondary | ICD-10-CM

## 2010-10-10 DIAGNOSIS — Z9889 Other specified postprocedural states: Secondary | ICD-10-CM

## 2010-10-10 DIAGNOSIS — I498 Other specified cardiac arrhythmias: Secondary | ICD-10-CM

## 2010-10-10 DIAGNOSIS — I4891 Unspecified atrial fibrillation: Secondary | ICD-10-CM

## 2010-10-10 DIAGNOSIS — I5032 Chronic diastolic (congestive) heart failure: Secondary | ICD-10-CM

## 2010-10-10 NOTE — Assessment & Plan Note (Signed)
Stable We will repeat echo upon return

## 2010-10-10 NOTE — Assessment & Plan Note (Signed)
Well controlled 

## 2010-10-10 NOTE — Assessment & Plan Note (Signed)
Stable No medicine changes today

## 2010-10-10 NOTE — Assessment & Plan Note (Signed)
Stable Continue Arixtra for stroke prevention per Dr Cyndie Chime

## 2010-10-10 NOTE — Patient Instructions (Addendum)
Your physician wants you to follow-up in: 6 months with Dr Johney Frame with an Echo one week prior to appointment You will receive a reminder letter in the mail two months in advance. If you don't receive a letter, please call our office to schedule the follow-up appointment.  Your physician has requested that you have an echocardiogram. Echocardiography is a painless test that uses sound waves to create images of your heart. It provides your doctor with information about the size and shape of your heart and how well your heart's chambers and valves are working. This procedure takes approximately one hour. There are no restrictions for this procedure.(one week prior to your 6 month follow up appointment)

## 2010-10-10 NOTE — Progress Notes (Signed)
The patient presents today for routine electrophysiology followup.  Since last being seen in our clinic, she reports doing very well. She was recently evaluated by Dr Hurman Horn at [facility] and had an event monitor placed.  This monitor documented nonsustained atrial arrhythmias but no sustained events.  Today, she denies symptoms of palpitations, chest pain, shortness of breath, orthopnea, PND, lower extremity edema, dizziness, presyncope, syncope, or neurologic sequela.  The patient feels that she is tolerating medications without difficulties and is otherwise without complaint today.   Past Medical History  Diagnosis Date  . Rheumatic heart disease 1995    post percutaneous valvuloplasty at [facility] with subsequent mechanical mitral valve replacement and tricuspid annuloplasty at [facility]  . Cerebrovascular accident     prior histories  . Subdural hematoma     on coumadin  . Antiphospholipid antibody syndrome   . Heparin-induced thrombocytopenia   . HTN (hypertension)   . History of Hashimoto thyroiditis   . HLD (hyperlipidemia)   . Atrial flutter     atypical  . Other activity     Torsades with amiodarone   Past Surgical History  Procedure Date  . Mitral valve replacement 2007    with tricuspid annuloplasty  . Amputation     left atrial appendage amputated with MVR  . Craniotomy 2008    for SDH  . Total abdominal hysterectomy   . Cholecystectomy   . Tonsillectomy     Current Outpatient Prescriptions  Medication Sig Dispense Refill  . acetaminophen (TYLENOL) 500 MG tablet Take by mouth as needed.        . AMOXICILLIN PO        . Calcium-Magnesium (CAL-MAG PO) Take by mouth daily.        . Cholecalciferol (VITAMIN D) 2000 UNITS tablet Take 2,000 Units by mouth daily.        Marland Kitchen diltiazem (CARDIZEM CD) 240 MG 24 hr capsule Take 240 mg by mouth daily.        . Estradiol Acetate (FEMRING) 0.05 MG/24HR RING daily.        . fish oil-omega-3 fatty acids 1000 MG capsule  daily.        . fondaparinux (ARIXTRA) 2.5 MG/0.5ML SOLN Inject 2.5 mg into the skin every other day.        . furosemide (LASIX) 20 MG tablet Take 20 mg by mouth as needed.        Marland Kitchen levothyroxine (SYNTHROID, LEVOTHROID) 75 MCG tablet Take 75 mcg by mouth daily.        . NON FORMULARY Suprema dophilus UAD      . rosuvastatin (CRESTOR) 5 MG tablet Take 5 mg by mouth daily.        . valACYclovir (VALTREX) 1000 MG tablet Take 1,000 mg by mouth as needed.        Marland Kitchen DISCONTD: polyethylene glycol (MIRALAX / GLYCOLAX) packet Take 17 g by mouth as needed.          Allergies  Allergen Reactions  . Amiodarone Hcl   . Heparin   . Iodinated Diagnostic Agents   . Vitamin K     REACTION: Anaphylaxis --- IV only allergy    History   Social History  . Marital Status: Married    Spouse Name: N/A    Number of Children: N/A  . Years of Education: N/A   Occupational History  . PHYSICIAN     retired   Social History Main Topics  . Smoking status: Never  Smoker   . Smokeless tobacco: Never Used  . Alcohol Use: No  . Drug Use: No  . Sexually Active: Not on file   Other Topics Concern  . 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     Family History  Problem Relation Age of Onset  . Heart disease Father     late 76's early 73's    ROS-  All systems are reviewed and are negative except as outlined in the HPI above  Physical Exam: Filed Vitals:   10/10/10 1123  BP: 120/70  Pulse: 53  Height: 5\' 3"  (1.6 m)  Weight: 150 lb (68.04 kg)    GEN- The patient is well appearing, alert and oriented x 3 today.   Head- normocephalic, atraumatic Eyes-  Sclera clear, conjunctiva pink Ears- hearing intact Oropharynx- clear Neck- supple, no JVP Lymph- no cervical lymphadenopathy Lungs- Clear to ausculation bilaterally, normal work of breathing Heart- Regular rate and rhythm, no murmurs, rubs or gallops, crisp mechanical S1 PMI not laterally  displaced GI- soft, NT, ND, + BS Extremities- no clubbing, cyanosis, or edema MS- no significant deformity or atrophy Skin- no rash or lesion Psych- euthymic mood, full affect Neuro- strength and sensation are intact  EKG today reveals sinus bradycardia 53 bpm, incomplete LBBB (QRS 122)  Assessment and Plan

## 2010-10-10 NOTE — Assessment & Plan Note (Signed)
Afib/ atrial tachycardia are currently quiescent. No changes as this time.

## 2010-10-14 ENCOUNTER — Encounter: Payer: Medicare PPO | Admitting: *Deleted

## 2010-11-07 ENCOUNTER — Other Ambulatory Visit: Payer: Self-pay | Admitting: Oncology

## 2010-11-07 ENCOUNTER — Encounter (HOSPITAL_BASED_OUTPATIENT_CLINIC_OR_DEPARTMENT_OTHER): Payer: Medicare PPO | Admitting: Oncology

## 2010-11-07 DIAGNOSIS — D6859 Other primary thrombophilia: Secondary | ICD-10-CM

## 2010-11-07 DIAGNOSIS — R894 Abnormal immunological findings in specimens from other organs, systems and tissues: Secondary | ICD-10-CM

## 2010-11-07 DIAGNOSIS — N189 Chronic kidney disease, unspecified: Secondary | ICD-10-CM

## 2010-11-07 LAB — CBC WITH DIFFERENTIAL/PLATELET
BASO%: 0.9 % (ref 0.0–2.0)
HCT: 34 % — ABNORMAL LOW (ref 34.8–46.6)
LYMPH%: 15.6 % (ref 14.0–49.7)
MCHC: 34.3 g/dL (ref 31.5–36.0)
MONO#: 0.3 10*3/uL (ref 0.1–0.9)
NEUT%: 73.1 % (ref 38.4–76.8)
Platelets: 98 10*3/uL — ABNORMAL LOW (ref 145–400)
WBC: 7 10*3/uL (ref 3.9–10.3)

## 2010-11-07 LAB — BASIC METABOLIC PANEL
Calcium: 8.9 mg/dL (ref 8.4–10.5)
Creatinine, Ser: 1.66 mg/dL — ABNORMAL HIGH (ref 0.40–1.20)

## 2010-11-08 NOTE — Consult Note (Signed)
[name], [name]             ACCOUNT NO.:  [id number]   MEDICAL RECORD NO.:  [id number]          PATIENT TYPE:  EMS   LOCATION:  MAJO                         FACILITY:  [facility]   PHYSICIAN:  Suzzette Righter, DO        DATE OF BIRTH:  Dec 27, 1945   DATE OF CONSULTATION:  09/07/2007  DATE OF DISCHARGE:                                 CONSULTATION   TIME SEEN:  2000.   CHIEF COMPLAINT:  Code stroke.   HISTORY OF PRESENT ILLNESS:  The patient is a 65 year old Caucasian  female seen in neurological assessment in the [facility]  emergency department as a result of a code stroke. Initially, the  patient's husband contacted the neurology answering service at  approximately 1910 on September 07, 2007 indicating that he thought his wife  was having either a stroke or a seizure. At that time, he was in the  [facility] emergency department parking lot. The patient's  husband was instructed to bring the patient to the emergency department  for evaluation. Upon arriving to the emergency department, the patient  obtained a CT scan of the brain without IV contrast. The CT scan of the  brain was essentially unchanged from previous imaging of August 28, 2007  with the exception of an area of scalp swelling on the left, adjacent to  the patient's previous craniotomy site. The patient's CT scan again  demonstrated the previous areas of encephalomalacia, involving the  branch right MCA distribution as well as the right periatrial, and left  frontal white matter. By the time of the assessment, the patient and her  husband indicated that she was back to baseline. 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 She is seen as an outpatient by hematology/oncology, for which  she is prescribed Arixtra. During her  outpatient evaluation earlier this  week, the patient apparently had an abnormal factor XA level, for which  the Arixtra dosing was cut to every other day. In addition, the patient  had markedly elevated blood pressure as an outpatient, for which  Lisinopril 10 mg was added to her current outpatient regimen of Norvasc  5 mg.   With regards to today's events, the patient states that she was in the  church parking lot at approximately 6:30 to 7:00 p.m. At that time,  while having a conversation, she initially noted right hand numbness,  which ascended, involved the right upper extremity. This was followed by  dysarthria and some expressive aphasia. Again, by the time of the  neurology assessment, following CT scan, the patient stated that she was  back to her baseline. Blood pressure in the emergency department was  159/65.   PAST MEDICAL HISTORY:  1. Amaurosis fugax.  2. Multiple previous strokes.  3. Antiphospholipid antibody syndrome.  4. 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  5. Mild left hemiparesis, left upper extremity incoordination.  6. Left sided subdural  hematoma.  7. Epistaxis.  8. Previous anaphylactic reaction to vitamin K.  9. Hypertension.  10.Hypothyroidism.  11.Previous pulmonary hemorrhage.  12.Congestive heart failure.  13.History of mild renal insufficiency.  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   OUTPATIENT MEDICATIONS:  1. Lisinopril 10 mg p.o. daily.  2. Norvasc 5 mg p.o. daily.  3. Synthroid 112 mcg daily.  4. Multivitamin.  5. Keppra 1000 mg p.o. b.i.d.  6. Arixtra.   ALLERGIES:  HEPARIN, HEPARIN-LIKE PRODUCTS, INTRAVENOUS DYE, IODINE.   SOCIAL HISTORY:  The patient is a retired Development worker, community. She resides with  her husband. She ambulates independently. She is independent of  activities of daily living. She does not consume tobacco, alcohol, or  illicit drugs at the present time.   REVIEW OF SYSTEMS:   Initially the patient reported right upper extremity  hand numbness, which ascended to the higher right upper extremity. The  patient also reported dysarthria and expressive aphasia. She indicates  that these symptoms have resolved at the time of the initial  examination. Denies any new vision disturbance. She does report a  headache since yesterday. Denies nausea, emesis, or new weakness. No  chest pain or shortness of breath are noted. No abdominal pain is noted.  No recent fall or injury is reported.   PHYSICAL EXAMINATION:  GENERAL:  Alert and awake. Pleasant and  cooperative. She does not appear to be in any acute distress. She does  not appear to be toxic.  VITAL SIGNS:  Blood pressure 132/76, heart rate 77 and regular.  Respiratory rate 20. Oxygen saturation of 100% on room air. Temperature  96 degrees Fahrenheit.  HEENT:  Patient with some left sided scalp swelling. Thee is no  ecchymosis, however. There is some tenderness on the left side of the  scalp. No CSF rhinorrhea or otorrhea are noted. There are no skin tears  noted.  NECK:  No carotid bruits are noted.  CARDIOVASCULAR:  Regular rate. A murmur is noted in the left sternal  border.  LUNGS:  Clear to auscultation bilaterally.  ABDOMEN:  Soft, nontender, and nondistended.  EXTREMITIES:  Without any signficant edema or cyanosis.  NEUROLOGIC:   Mental Status: Alert and awake. Speech is fluent without any dysarthria  or dysphagia. She was able to complete the Reitan aphasia scale  including reading a paragraph and following commands accurately. There  was no right/left confusion. The patient was able to state the year,  day, month, President, and location accurately. There was no anomia with  2 objects. She incorrectly stated the date as the 13th, as opposed to  the 14th.   Cranial nerve examination: Pupils are equal, round, and reactive to  light bilaterally. A partial left hemianopsia is noted. Extraocular  muscles  intact bilaterally. There is no facial asymmetry with regards to  sensation to light touch, as well as pin stimulation. A very minimal  left eye ptosis is present. There are no gross hearing deficits. Palate  elevated symmetrically. Sternocleidomastoid and trapezius strength are  full. Tongue protrudes midline without any lacerations.   Motor examination: There is no resting or action tremor. There is no  pronator drift. Elbow flexion and elbow extension are 5/5 on the right.  There is some increased tone in the left upper extremity as a result of  the patient's previous strokes. There is decreased range of motion  with  regards to the hand. However, strength is again rated as 5/5 in the left  upper extremity as well with regards to  elbow flexion, elbow extension,  and shoulder abduction. Hip flexion is 5/5 bilaterally. Ankle plantar  flexion, ankle dorsiflexion are 5/5 bilaterally.   Reflexes: Biceps, triceps, adn brachioradialis are 2/4 on the right and  3/4 on the left. Patellar response is 2+/4 on the right and 3+/4 on the  left. Achilles is 2/4 on the right and 2+/4 on the left.   Sensory examination: Vibratory sensation is completely intact on the  right side of the body. It is diminished by approximately 10% in the  left upper extremity and approximately 30% in the left lower extremity.  Pin sensation reveals hyperesthesia of the left upper extremity, which  is chronic according to the patient. Pin sensation is diminished in the  left lower extremity as compared to the right lower extremity. There is  no ascending gradient noted in either upper or lower extremities.   Cerebellar examination, finger-to-nose is intact bilateral upper  extremities. Gait, the patient was able to ambulate approximately 30  feet to the restroom unassisted and return back from the restroom to her  hospital bed in the ER.   DIAGNOSTIC STUDIES:  CT imaging of the brain performed in the emergency   department without IV contrast was compared to the previous imaging of  August 28, 2007. The only interval change noted is the presence of a small  left soft tissue scalp swelling. This is all extra-calvarial in nature.  Chronic findings include the presence of a calcified left subdural  membrane, consistent with the patient's previous left subdural hematoma,  as well as a right branch middle cerebral artery area of  encephalomalacia, right periatrial area of encephalomalacia and a left  frontal white matter area of encephalomalacia. No new substantial  subdural hematoma was identified.   LABORATORY DATA:  White blood cell count 5.7. Hemoglobin and hematocrit  11.6 and 33.9. Platelet count 128,000. PT 12.4, INR 0.9, PTT 51.  Remainder of the patient's labs are currently pending.   IMPRESSION:  1. The patient is a 65 year old Caucasian female seen in neurological      assessment in [facility] emergency department secondary to      a code stroke. The patient seen at 1930, approximately 8 minutes      after being called. The patient was at church in the parking lot,      when she noted right hand numbness which evolved to involve the      entire right upper extremity and was followed by dysarthria and      expressive aphasia. The patient's husband called neurology      answering service and was instructed to come to the [facility] emergency department, as they were in the [facility] parking lot at the time of the phone call. Patient seen      after CT scan. At that time, neurologic examination reveals patient      to be back at baseline without dysarthria, aphasia, or right upper      extremity sensory deficits. The patient with chronic left      homonomous hemianopsia, left upper extremity decrease in      coordination, and mild left sensory deficits. Possible etiologies      include:      a.     Transient ischemic attack.      b.     Left likely,  focal seizure although previous left subdural  hematoma does place the patient at increased risk and the patient       does  have a  history of possible seizure.      c.     No evidence of enlarging or new subdural hematoma, although       new left scalp swelling (extra-calvarial) is noted.  2. Recent reduction in the patient Arixtra dosing (factor XA      inhibitor) given abnormal XA levels as outpatient, which place the      patient at increased risk of thrombotic events, given history of      anti-phospholipid antibody syndrome.  3. Hypertension. Blood pressure medications being titrated as an      outpatient. There was a new addition of Lisinopril 10 mg daily this      week.  4. History of branch right middle cerebral artery and right periatrial      ischemic strokes, as well as left frontal white matter infarct, all      unchanged from previous CT scan of August 28, 2007.  5. History of left subdural hematoma, calcified membrane without any      acute blood on present CT scan. No significant change from CT of      August 28, 2007, other than the extra-calvarial fluid collection.  6. Medical history of hypothyroidism, tricuspid valve replacement,      heparin induced thrombocytopenia.   PLAN:  1. Admit to primary care physician.  2. Do not initiate any antiplatelet agents.  3. Resume home medications.  4. Will give 1000 mg Keppra p.o. x1 as patient did not take evening      dose.  5. Discussed with on call primary care physician.  6. Consult with Dr. Truett Perna in a.m. per patient request. Attempted to      contact through the emergency department.  7. Serial neurological examination.  8. Repeat CT scan of the brain tomorrow, to assess the fluid      collection.  9. Echocardiogram and cardiac telemetry.   ADDENDUM:  At 2035 on September 07, 2007, the patient indicated that her  speech, although greatly improved, was not back to baseline as she had  previously indicated. Will  proceed with careful observation and order  repeat CT scan tomorrow.   Thank you for allowing me to participate in the care of your patient. If  at any point you should have any questions, please feel free to contact  me.      Suzzette Righter, DO  Electronically Signed     RH/MEDQ  D:  09/07/2007  T:  09/07/2007  Job:  [id number]   cc:   Pramod P. Pearlean Brownie, MD

## 2010-11-08 NOTE — H&P (Signed)
[name], [name] NO.:  [id number]   MEDICAL RECORD NO.:  [id number]          PATIENT TYPE:  OUT   LOCATION:  CATS                         FACILITY:  [facility]   PHYSICIAN:  [name]. Granfortuna, M.D.DATE OF BIRTH:  [birth year]   DATE OF ADMISSION:  07/24/2007  DATE OF DISCHARGE:                              HISTORY & PHYSICAL   ADDENDUM:  If possible, please put both these reports into one single  report.  Please see initial part of this report that was interrupted.   PAST SURGICAL HISTORY:  Previous surgeries include an initial mitral  valve annuloplasty at [facility].  Subsequent need for replacement of the  mitral valve with concomitant tricuspid annuloplasty.  Hysterectomy plus  oophorectomy.   CURRENT MEDICATIONS:  1. Coumadin 7.5 mg daily.  2. Lisinopril 10 mg daily.  3. Synthroid 0.112 mg daily.  4. Tylenol or Percocet p.o. q.4 h. headache.   ALLERGY:  To CONTRAST DYE and allergy to HEPARIN with heparin induced  thrombocytopenia.   FAMILY HISTORY:  Her mother has colon cancer, currently in remission.  Her father just expired.  No siblings.   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  She is a nonsmoker  and drinks a rare glass of wine.  She is married.  Her husband also has  an aortic valve replacement and is on Coumadin.   REVIEW OF SYSTEMS:  Unremarkable except as outlined above.  She denied  any confusion, no dysarthria.  No new areas of focal weakness.  No chest  pain, dyspnea, palpitations, abdominal pain.  Positive constipation  following some Percocet she took last night for headache.   PHYSICAL EXAMINATION:  GENERAL:  Physical examination shows a well-  nourished Caucasian woman in no acute distress.  She is alert, awake and  oriented.  Speech is fluent.  VITAL SIGNS:  Pending.  Pulses regular.  She is in no respiratory  distress.  HEENT:  The pupils were equal, reactive  to light.  The optic disk on the  left side is indistinct but this is a chronic finding.  The right disk  is sharp.  The pharynx without erythema or exudate.  Palate elevates  symmetrically.  Tongue is midline.  NECK:  Neck is supple.  LUNGS:  Lungs are clear and resonant to percussion.  CARDIOVASCULAR:  Regular cardiac rhythm with mechanical heart valve  sound.  No murmurs.  BREASTS:  Not examined.  ABDOMEN:  The abdomen is soft and nontender.  No masses, no  organomegaly.  EXTREMITIES:  No edema.  No calf tenderness.  NEUROLOGICAL:  No gross focal deficit except for weakness of the left  hand grip and clumsiness on fine motor coordination of the left hand.  Cranial nerves are intact.  Motor strength 5/5 all extremities.  Reflexes 2+ symmetric.  Finger-to-finger exam is normal.   LAB:  Hemoglobin 10.8, hematocrit 31, white count 5500, 75% neutrophils,  platelet count 120,000.  Pro-time is 28.7 seconds, INR is 2.6.  A PTT is  79 seconds.  BUN is 26,  creatinine 1.1, potassium is 5, bicarbonate 26,  random glucose is 89.  Liver chemistries are normal including LDH.   Electrocardiogram shows normal sinus rhythm with a right axis deviation,  Q-wave in lead V1, V2, V3.  Poor R-wave progression.  No comparison  tracing available at present.   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  Despite cautious reintroduction of anticoagulation following  recent June 18, 2007, acute subdural hematoma evacuation, she now  shows evidence of new bleeding.  Fortunately she does not have any new  focal neurological deficits at the moment.  However, I think it is  imperative that we rapidly reverse her Coumadin.  I will administer  vitamin K parenterally and fresh frozen plasma and monitor neurologic  status closely.  I have asked one of our neurosurgeons, Dr. Trey Sailors, to  assist if there is any clinical/neurologic deterioration and he reviewed  the CT scans with  me and the radiologist.  1. Two years status post emergency mitral valve replacement for acute      congestive heart failure and mitral valve failure with concomitant      tricuspid valve annuloplasty.  Right now the risk of      anticoagulation exceeds the benefit.  We will have to reevaluate      her anticoagulant program.  At this point I am inclined not to      reintroduce Coumadin for number of months.  I will likely put her      back on low dose Arixtra when she is otherwise stable.  Monitor      factor Xa levels to keep her in a safe therapeutic range.  2. History of DIC at time of emergency mitral valve replacement 10/07      - she received platelet transfusions & IVIG at that time and is      likely sensitized to platelets.  4..  Antiphospholipid antibody syndrome.  1. Complex coagulopathy secondary to #2 with history of prior strokes      x3.  2. History of heparin induced thrombocytopenia.      [name]. [name], M.D.  Electronically Signed     JMG/MEDQ  D:  07/24/2007  T:  07/24/2007  Job:  [id number]   cc:   [name], M.D.  [name], M.D.  [name], M.D.  [name], M.D.  [name], Dr  [name], Dr

## 2010-11-08 NOTE — Discharge Summary (Signed)
[name], GOTCHER NO.:  [id number]   MEDICAL RECORD NO.:  [id number]          PATIENT TYPE:  INP   LOCATION:  3031                         FACILITY:  [facility]   PHYSICIAN:  [name], M.D.DATE OF BIRTH:  Nov 14, 1945   DATE OF ADMISSION:  09/07/2007  DATE OF DISCHARGE:  09/09/2007                               DISCHARGE SUMMARY   DISCHARGE DIAGNOSES:  1. 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  2. Stable remote infarcts involving the posterior right frontal lobe,      right occipital lobe, and left anterior frontal lobe.  3. Small left subdural collection remained stable from prior exam.  4. Left scalp seroma resulting from previous craniotomy.  5. Hypertension.  6. Antiphospholipid antibody syndrome.  7. Anticoagulation therapy with Arixtra.  8. 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  9. Hypothyroidism.   DISCHARGE MEDICATIONS:  1. Lisinopril 10 daily.  2. Norvasc 5 mg daily.  3. Synthroid 112 mcg daily.  4. Keppra 1000 mg p.o. q.a.m. and 1500 mg p.o. q.p.m. - increase of      500 mg per day.  5. Arixtra 2.5 mg subcu daily.  6. Percocet 5/325 one p.o. q.6 h. p.r.n.  7. Colace 100 mg 2 daily.   CONSULTATIONS:  1. Neurology, Dr. Suzzette Righter and Dr. Avie Echevaria.  2. Dr. Trey Sailors, Neurosurgery.  3. Dr. Ruthann Cancer, Hematology/Oncology.   DISCHARGE LABORATORIES:  1. Head CT without contrast on September 07, 2007 - left parietal scalp      swelling overlying previous frontal parietal craniotomy.  Fluid per      MRI was  consistent with postoperative seroma.  Right parietal and      occipital encephalomalacia again noted and small area of left      frontal encephalomalacia noted and stable.  2. MRI of the brain performed on September 09, 2007, revealed previous      infarcts as per CT.  Scalp swelling of the left consistent with      complex collection,  felt to likely be a seroma with proteinaceous      material.  3. No  evidence of acute infarct.   Chronic small left subdural collection remained as well.   Laboratories from September 08, 2007, revealed a white count of 4300,  hemoglobin 10.3, and platelet count 101,000.  Pro-time on September 07, 2007, was 12.4 seconds, INR 0.9 seconds, and PTT 51 seconds.  Electrolytes from September 08, 2007, revealed sodium 143, potassium 4.2,  chloride 111, bicarb 24, glucose 79, BUN 27, and creatinine 1.54; and  liver functions from September 08, 2007, revealed a total protein 5.8,  albumin 3.1, AST 14, ALT 16, alk phos 73, total bili 0.6, magnesium 2.2,  and phosphorus 4.7.  Urinalysis from September 07, 2007, revealed 3-6 white  cells, 3-6 red cells, many bacteria, rare epithelial cells, and pH 8.5,  appearance turbid.   HOSPITAL COURSE:  [name] was in her usual state of health; and  abruptly on September 07, 2007, at approximately 1920, she was in a church  parking lot, when she noted right hand  numbness, which evolved to the  entire right upper extremity followed by dysarthria and expressive  aphasia.  The Neurology Service was called by the husband, and they  presented to the [facility] Emergency Room within 8 minutes.  Neuro exam  revealed her to be back to baseline without dysarthria, aphasia, or  right upper extremity sensory deficit.  She was noted to have a chronic  left homonymous hemianopsia and left upper extremity decreased  coordination and mild left hemisensory deficits.  The patient was  admitted for observation with MRI scanning performed and further neuro  consultation by Dr. Guilford Shi and Dr. Avie Echevaria.  She was also seen in  consultation by Neurosurgery and Hematology/Oncology.   She had no further clinical deterioration while hospitalized, and it was  thought that she likely had a seizure, and Keppra was increased during  the hospitalization.  She was discharged home with the above medications  to continue Arixtra daily instead of every other day and with an   increased dose of Keppra.  She is to follow up with Dr. Sandria Manly in 1-2  weeks and Dr. Cyndie Chime in 1-2 weeks.   CONDITION ON DISCHARGE:  Improved.      [name], M.D.  Electronically Signed     RNG/MEDQ  D:  10/01/2007  T:  10/01/2007  Job:  [id number]   cc:   [name], M.D.  Genene Churn. Cyndie Chime, M.D.

## 2010-11-08 NOTE — H&P (Signed)
[name], [name] NO.:  [id number]   MEDICAL RECORD NO.:  [id number]            PATIENT TYPE:   LOCATION:                                 FACILITY:   PHYSICIAN:  Donalynn Furlong, MD      DATE OF BIRTH:  [birth year]   DATE OF ADMISSION:  09/07/2007  DATE OF DISCHARGE:                              HISTORY & PHYSICAL   PRIMARY CARE PHYSICIAN:  [name], M.D.   CHIEF COMPLAINT:  Right-sided hand weakness, facial weakness, and  slurred speech.   HISTORY OF PRESENT ILLNESS:  [name] is a 65 year old female who  presented to the ER tonight with complaint of right-sided tingling  numbness on the hand starting at around 7:25 p.m., and it lasted for  around 10 minutes before she presented to the ER.  She was on the phone  while she was having these symptoms and slurred speech.  When she came  to the ER, progressively her weakness and speech trouble have improved.  Her tingling and numbness has subsided.  She does have a history of a  stroke . She has subdural hematoma due to Coumadin in the past.  Her  symptoms are acute and improving.  She denied any other symptoms at all.   PAST MEDICAL HISTORY:  1. Hypertension.  2. Cerebrovascular accident.  3. Subdural hematoma.  4. Antiphospholipid antibody syndrome.  5. Hashimoto thyroiditis.  6. Mechanical mitral valve replacement.  7. Tricuspid annuloplasty.  8. History of anaphylactic reaction to PARENTERAL VITAMIN K.  9. History of DIC.  10.History of high blood pressure.   PAST SURGICAL HISTORY:  1. Cardiac surgery as above.  2. Subdural hematoma repair.   FAMILY HISTORY:  Noncontributory.   SOCIAL HISTORY:  No history of alcohol, drug, or tobacco use.   MEDICATIONS AT HOME:  Include Keppra, lisinopril, Synthroid, vitamin D,  Arixtra, Norvasc.   DRUG ALLERGIES:  HEPARIN AGENTS, IV DYE, IODINE CONTAINING, and VITAMIN  K ANALOGS.   REVIEW OF SYSTEMS:  Positive as per HPI, otherwise Review of  Systems  negative organ systems.   PHYSICAL EXAMINATION:  VITAL SIGNS:  Blood pressure 132/76, pulse 77,  respirations 20, temperature 96.8, oxygen saturation 100% on room air.  GENERAL:  Alert and oriented x3.  No acute distress.  CARDIOVASCULAR:  S1, S2 regular.  No murmurs, rubs, or gallops  appreciated.  Click of artificial valve appreciated.  LUNGS:  Clear to auscultation bilaterally.  No wheezing.  ABDOMEN:  Nontender, nondistended.  Bowel sounds present.  EXTREMITIES:  No clubbing, cyanosis, or edema.  Pulses felt in all four  extremities.  HEAD:  Normocephalic and atraumatic.  EYES:  Pupils equal, round, and reactive to light and accommodation.  Extraocular muscles intact.  ORAL CAVITY:  Oral mucosa moist.  No thrush noted.  NECK:  No thyromegaly or JVD.  SKIN:  No rash or bruises.  NEUROLOGIC:  No focal neurological deficits except some decreased  strength in her left upper extremity.  No cranial nerve deficits.  No  sensation impairments.  Reflexes are intact.   LABORATORY DATA:  EKG shows low QRS voltage, poor R wave progression,  normal sinus rhythm.  Compared to previous EKG, it looks the same.   Chest x-ray unremarkable.   PT 12.4, INR 0.9, APTT 51.  WBC 5.7, hemoglobin 11.6, platelets 128.  Sodium 139, potassium 4.1, chloride 105, carbon dioxide 25, glucose 93,  BUN 31, creatinine 1.76, calcium 8.4, total protein 6.8, albumin 3.7,  SGOT 21, SGPT 23, alkaline phosphatase 103, bilirubin 4.6, GFR 29.  Cardiac enzymes first set negative.   ASSESSMENT:  1. Transient ischemic attack.  2. Hypertension.  3. Antiphospholipid antibody syndrome.  4. History of subdural hematoma status post repair.  5. Hypothyroidism.   PLAN:  1. Will admit patient to neurological floor on telemetry bed under Dr.      Johnella Moloney with the diagnosis of TIA.  The patient is stable.  2. Will do neurological checks.  3. Will get CBC, CMET, magnesium phosphate, cardiac enzymes.  4 . Will  continue lisinopril and Norvasc at home doses and continue  Arixtra subcutaneously 2.5 mg every other day.  1. Will get hematology consult Dr. Truett Perna in the morning for      management of Arixtra.  2. Neurology has already seen the patient and talked to me, and we      have discussed recommendations. They agree      with the plan.  3. The patient will start to take her home dose of Keppra also.  4. The patient will get a low-salt diet.      Donalynn Furlong, MD  Electronically Signed     TVP/MEDQ  D:  09/07/2007  T:  09/07/2007  Job:  [id number]   cc:   [name], M.D.

## 2010-11-08 NOTE — Consult Note (Signed)
[name], SORCI NO.:  [id number]   MEDICAL RECORD NO.:  [id number]          PATIENT TYPE:  INP   LOCATION:  3112                         FACILITY:  [facility]   PHYSICIAN:  Payton Doughty, M.D.      DATE OF BIRTH:  11-01-1945   DATE OF CONSULTATION:  07/25/2007  DATE OF DISCHARGE:                                 CONSULTATION   REFERRING PHYSICIAN:  Cephas Darby, M.D.   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  She has a history of anticardiolipin antibodies.  She  is on Coumadin.  She had a subdural in [place] on May 18, 2007, was  discharged, and in followup then had developed some aphasia and question  of a seizure.  She was readmitted and discharged there and was admitted  to [facility] yesterday with a headache after a CT showed a small left  parietal subdural hematoma.  When she was in [place] and had the  subdural, her INR was in the range of 5 to 6.  When she had recurrence  of her subdural yesterday, it was in the range of 2 to 2.5.  She has had  an anaphylactic reaction to vitamin K infusion, so her coags have been  corrected with fresh frozen plasma.  Currently, her most recent CT does  not show any increase in the subdural.   MEDICAL HISTORY:  Remarkable for a right brain stroke in 1993, left  brain TIA with aphasia, and right brain stroke after mitral valve  replacement in 2007.  She had been on Coumadin, but as noted above she  has had to have this stopped and corrected.  Most recent platelets are  65,000 with an INR of 1.   EXAMINATION:  She is awake, alert, and oriented x3.  She has a left  homonymous quadrantanopsia inferiorly which is slightly incongruence.  It is a little bit worse in the left eye than the right eye.  Her pupils  are equally round and reactive to light.  Her extraocular movements are  full.  Face is equal in sensation, but she has diminished movement in  the __________  corner of the mouth.  Tongue is in the midline.  Motor  exam shows a left pronator drift.  She has diminished sensation in the  left hand.  Reflexes on the left side are 2 with downgoing toes and on  the right side are 1 in the upper extremities and 2 in the lower  extremities with downgoing toes.   CT shows left parietal hematoma without shift and without change over  the past 24 hours.   IMPRESSION:  This is an exquisitely complicated patient from a  cerebrovascular standpoint.  Obviously, with new blood in the head, the  coags need to be normalized; however, this places the patient at  significantly increased risk for stroke.  For now, she has to be  maintained with normal coags, and I hope that in several weeks general  anticoagulation can be undertaken.  She understands the gravity of the  situation.  ______________________________  Payton Doughty, M.D.     MWR/MEDQ  D:  07/25/2007  T:  07/26/2007  Job:  [id number]

## 2010-11-08 NOTE — Consult Note (Signed)
[name], [name] NO.:  [id number]   MEDICAL RECORD NO.:  [id number]          PATIENT TYPE:  INP   LOCATION:  3112                         FACILITY:  [facility]   PHYSICIAN:  [name], M.D.    DATE OF BIRTH:  03/05/1946   DATE OF CONSULTATION:  07/25/2007  DATE OF DISCHARGE:                                 CONSULTATION   This is one of several [facility] admissions for this 65-year-  old right-handed white married female, retired Health and safety inspector from  [place] seen in the neuro intensive care unit at approximately 7:30  p.m. for evaluation of new subdural hematoma.   HISTORY OF PRESENT ILLNESS:  [name] has a known prior history of  migraine, anticardiolipin antibody syndrome, mitral valve prolapse with  associated mitral stenosis causing pulmonary hypertension and requiring  open heart surgery with St. Jude valve replacement at [facility] in October 2007, protein S deficiency,  right brain stroke in 1993 causing left visual field cut, history of  left brain stroke causing aphasia, and left hemiparesis following right  brain stroke for mitral valve surgery at [facility] October 2007.  She has had a residual left hemiparesis  since that time.  In addition, she has a known history of hypertension  and Hashimoto's  thyroiditis.  She has been on Coumadin for her mitral valve replacement  and had being doing quite well.  She was in [place] visiting after her  father's funeral and was found to have a supratherapeutic INR.  Developed a 2 week history of headaches and was seen in the emergency  room June 17, 2007 and admitted to [facility] with acute and  chronic left-sided subdural hematoma requiring evacuation of hematoma  with bone flap.  This occurred after reversing her INR.  She was  discharged on 2.5 mg of Arixtra subcutaneously every 8 hours because of  associated heparin-induced thrombocytopenia while in the hospital.  She  was discharged on June 22, 2007, but was readmitted on June 24, 2007 after developing aphasia lasting approximately 5 minutes which was  secondary to a seizure.  She was placed on Keppra in the hospital and  subsequently discharged.  While in the hospital, she had a CT scan, 2-D  echocardiogram and EEG.  She has had difficulty with daytime sleepiness  on Keppra.  Over the last 24 hours, she has developed problems with  headache without associated seizure-like activity and came to the  emergency room today at the request of Dr. Cephas Darby for  evaluation.  A CT scan showed evidence of a left frontal craniotomy with  drainage of subdural hematoma.  There was a comparison with a previous  MRI scan of June 24, 2007.  It showed interval improvement and  incomplete resolution of broad-based left-sided subdural hematoma, old  infarcts bilaterally occurring in the right mid cerebral artery  distribution, the calcar fissure of the occipital lobe in the left  frontal region and acute left-sided parietal hematoma.   LABORATORY DATA:  CBC with hemoglobin of 10.8,  hematocrit 31.4, white  blood cell count 5500, platelet count 120 K.  Pro-time was 28.7, INR of  2.6, PTT was 79.  Comprehensive metabolic panel revealed sodium 138,  potassium 5.0, chloride 108, CO2 26, glucose 89, BUN 26, creatinine  1.07.  Her alk phos was 84, SGOT of 20, SGPT was 17, total protein 6.9,  albumin 3.5, calcium 8.5, LDH was 211.  She was treated with fresh  frozen plasma.   HOME MEDICATIONS:  1. Keppra 1000 mg twice per day.  2. Arixtra was 2.5 mg every day.  3. Coumadin dosage unknown.  4. Lisinopril 10 mg every day.  5. Synthroid 112 mcg every day.  6. Vitamin D 2000 mg every day.  7. Multivitamin without vitamin D.  8. 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   PHYSICAL EXAMINATION:  GENERAL:  Well-developed white  female.  VITAL SIGNS:  Weight approximately 160 pounds.  Sitting blood pressure  in right and left arm of 120/60 and 110/60, the heart rate was 75 and  regular.  HEENT:  No bruits were heard.  She was status post left craniotomy.  MENTAL STATUS:  She was alert and oriented x3, followed 1-2-3 step  commands.  She had some mild word-finding difficulties.  Her cranial  nerve examination revealed decreased left corner of her mouth, left  inferior quadrant anopsia, worse in the left eye than the right eye  which was therefore incongruous and incomplete.  Her extraocular  movements were full.  Both disks were seen and flat.  Pinprick was equal  in the face.  Her motor examination revealed left hand and arm drift.  She had decreased rapid alternating movement skills in the left hand and  arm.  Increased tone in the left hand and arm. Decreased 2-point  discrimination in drop position in her left hand.  She had increased  deep tendon reflexes in her left hand and arm.  She had decreased left  arm strength when she used her walker.  Her deep tendon reflexes were 2+  and plantar responses were downgoing.   DIAGNOSTICS:  CT scan showed old right MCA stroke, right occipital and  left frontal stroke, decrease in her left frontal subdural hematoma and  new left parietal subdural hematoma.   IMPRESSION:  1. New acute left-sided subdural hematoma, 852.26.  2. 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  3. History of right brain stroke following mitral valve replacement      with residual left hemiparesis and loss of function of her left      hand and arm, 434.01.  4. History of right brain calcar stroke with residual left visual      field cut, 368.40.  5. History of left brain stroke with aphasia in the past, 434.01 and      784.3.  6. History of mitral valve prolapse followed by mitral valve stenosis      treated with balloon  angioplasty at [facility] in 2006 and subsequently      total mitral valve replacement with St. Jude valve in October 2007      at [facility], 424.0.  7. Anticardiolipin antibody syndrome, 289.81.  8. Protein S deficiency, 289.81.  9. Hypertension, 796.2.  10.History of left shoulder pain, 719.41.  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   PLAN:  Plan at this time is to follow the patient in the hospital.  ______________________________  Genene Churn. Sandria Manly, M.D.     JML/MEDQ  D:  07/24/2007  T:  07/25/2007  Job:  [id number]   cc:   Genene Churn. Cyndie Chime, M.D.  Payton Doughty, M.D.

## 2010-11-08 NOTE — Discharge Summary (Signed)
[name], CARRILLO NO.:  [id number]   MEDICAL RECORD NO.:  [id number]          PATIENT TYPE:  INP   LOCATION:  3038                         FACILITY:  [facility]   PHYSICIAN:  Genene Churn. 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  She presented a number of  years ago with first an episode of amaurosis fugax then subsequently  another transient ischemic attack.  She has been on chronic Coumadin  anticoagulation and up until November 2008, also on low-dose aspirin.  Her baseline platelet count is affected by the APLS and usually runs  around 100,000.   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  I believe that this  valve failure is directly related to the APLS.  She was evaluated at  [facility].  She required emergency mitral valve  replacement and a concomitant tricuspid valve annuloplasty by Dr. Welton Flakes.  With the fulminant onset of the heart failure, she also went into DIC  and became profoundly thrombocytopenic prior to the procedure and  required blood and platelet transfusions, steroids and intravenous  immunoglobulin with only partial improvement in her platelet count.  The  valve was replaced with a mechanical St. Jude prosthesis.  Unfortunately, she suffered a right brain infarct in the recovery room  with  resulting dysarthria and left hemiparesis.  She has had a near  complete neurologic recovery with residual deficits primarily in her  left hand with weakness and loss of coordination.   She had to go down to [place], [place], in late December when her  father  became seriously ill.  Unfortunately, he died while she was down  there assisting him.  I had stopped her aspirin in November 2008, due to  recurrent episodes of epistaxis.  When she left [place], her Coumadin  level was stable with an INR of 2.6.  However, she developed headaches  and low back pain.  INR was checked and was 5.6.  Her Coumadin was  stopped, but over the next 48 hours, she developed a change in her  mental status and was brought to the [facility] in  [place], [place], on an emergency basis.  She was found to have a  large, left frontotemporal subdural hematoma.  Her Coumadin was  reversed.  She underwent emergency craniotomy and drainage of the  subdural hematoma.  Her neurologic deficits all reversed back to her  baseline.  She was cautiously put back on anticoagulation a few days  after the surgery with Arixtra at a dose of 2.5 mg.  Six days following  the surgery, she developed transient aphasia lasting for about an hour.  She was re-evaluated in [place] and found to have a small area of  new bleeding.  She was neurologically intact  at that time.  It was  elected to keep her on the low-dose Arixtra.  She returned to [place]  in the beginning of January.  I kept her on the 2.5 mg dose of the  Arixtra for an additional 2 weeks.  I then cautiously started her back  on Coumadin.  Two days prior to the current admission, her INR was 2.4.  I stopped the Arixtra. and continued the coumadin at a dose of 7.5 mg  daily.   She called on the day of admission to report a 36-hour history of vague,  diffuse headache and low back pain very similar to the symptoms that she  had when she had her initial subdural bleed.  I advised her to come in  immediately for further evaluation.  A CT scan of the brain was done  without contrast and unfortunately, showed a new area of fresh subdural  hemorrhage in the left parietal lobe area.  She was admitted for  further  evaluation and treatment.   PHYSICAL EXAMINATION:  GENERAL:  Well-nourished, Caucasian woman in no  acute distress.  VITAL SIGNS:  Blood pressure 116/49, pulse 71 and regular, respirations  22, temperature 98.1 oxygen saturation 100% on room air.  HEENT:  Pupils equal, reactive to light.  Optic disks sharp.  No  hemorrhage or exudate.  NEUROLOGIC:  Motor strength 5/5 in all extremities except for fixed  deficits of the left hand in both strength and coordination which were  chronic.  Reflexes 1+ and symmetric.  Mechanical heart valve sounds.   HOSPITAL COURSE:  Baseline laboratory data showed a hemoglobin of 11,  hematocrit 31, white count 5500 with 75% neutrophils and a platelet  count of 120,000.  Pro time 28.7 was seconds, INR 2.6, PTT 79 seconds.  BUN was 26, creatinine 1.1, potassium 5.  Liver functions normal.  Random glucose 89.  Type and screen shows that she was type O, Rh  negative.  Initial antibody screen negative.   Vascular access was obtained and a dose of parenteral vitamin K 10 mg  was initiated to promptly reverse her Coumadin while waiting for  availability of fresh frozen plasma.  Within a minute of the initial  infusion of the vitamin K, she became flushed and had a feeling of  dystonia.  I was at the bedside at this time.  She then developed  diffuse urticaria covering both of her arms.  I stopped the vitamin K  infusion.  Her blood pressure then fell to 50 mm systolic.  Intravenous  Solu-Medrol 60 mg and Benadryl 50 mg p.o. were administered with very  prompt reversal of her symptoms.  Blood pressure lagged behind, but  stabilized with normal saline fluid challenges.   In view of the urgent need to reverse her coagulopathy and the reaction  to the vitamin K, I elected to give her a dose of FEIBA 50 units/kg.  This product has activated clotting factors and can promptly reverse  Coumadin.  She tolerated this product without any obvious side-effects.   She subsequently received 4 units of FFP which would give an approximate  40% immediate correction of a coagulopathy due to factor deficiency or  Coumadin.   This strategy was successful and a pro time done following the fresh  frozen plasma and FEIBA had normalized (pro time 12.4, INR 0.9).   The patient was transferred to the neurosurgery intensive care unit  where she was monitored closely.  She was seen by both neurology  and  neurosurgical consultants.  Her neurologic status remained stable for  the duration of the hospital.  She had almost daily followup,  noncontrast CT scans which showed stable area of subdural hematoma with  no new areas and no extension of bleeding despite other events that  occurred subsequently.   On the day after admission, a CBC showed her hemoglobin had fallen from  her baseline of 11 g down to 8.4 and platelets fell from 120,000 to  53,000.  Protime was still normal at 13.6 seconds with INR of 1.0.  I  elected to watch closely and repeat laboratory studies later the same  day.  Followup count showed platelets stable at 65,000.  Pro time  remained normal, but PTT still slightly elevated at 44 seconds.  A  fibrinogen was normal.  A D-dimer was significantly elevated, but  consistent with a new subdural hemorrhage.   Later that same day, she developed sudden onset of hacking cough.  She  then coughed up a small amount of fresh red blood.  I was then called  just 1/2 hour later that she had a small volume approximately 50 mL of  hematemesis.  I am not clear if this was just swallowed blood from the  respiratory tract.   I was concerned with the possibility that we were seeing a DIC process.  On initial exam, she was in no respiratory distress.  Oxygen saturation  was 100% on room air.  She was not tachycardiac.  She had no jugular  venous distention.  There was no S3 gallop.  There was no peripheral  edema.  I obtained a chest radiograph which I  personally reviewed with  the radiologist.  This showed a localized patchy infiltrate in the right  middle lobe.  I felt that the clinical findings were most consistent  with pulmonary hemorrhage.  I elected to give her another dose of the  FEIBA 100 units/kg and 2 additional units of fresh frozen plasma.  Over  the next 6 hours, she developed increasing respiratory distress and fall  in her oxygen saturation.  She was re-examined by myself and also by  critical care medicine consultant.  At that point, she was tachycardiac.  Oxygen saturation had fallen and she had to be placed on BiPAP to  achieve saturations of over 90%.  She now had diffuse bilateral coarse  rhonchi over the lungs and jugular venous distention.  A BNP lab test  was significantly elevated at 1053.  Arterial blood gas on 40% oxygen  showed a pH of 7.5, pO2 52, pCO2 34.   Treatment was initiated for diagnosis of acute congestive heart failure.  Fortunately, she responded very briskly to parenteral diuretics with  rapid resolution of her respiratory symptoms over the next 24 hours.   The next day brought new problems, however.  Her platelet count began to  fall.  Platelet count was 62,000 on July 26, 2007, and fell to 37,000  on January 31, then 24,000 on February 1.  I had given her a platelet  transfusion and in addition to the FFP and the second dose of FEIBA when  I thought that she was having pulmonary hemorrhage.  She did not respond  to this transfusion with a platelet count remaining essentially the same  24 hours after the transfusion.   I was not clear why her platelet count was going down.  There was no  sign of infection.  She had been put on empiric  antibiotics with Avelox  at the time she had her respiratory decompensation.  I elected to stop  the Avelox as a potential reason for the thrombocytopenia.  I also  stopped the Prilosec that she was on to exclude this medication as being  a possible  offender.  Overall, I felt that what we were seeing was a DIC  process which had started on admission and likely due to the new  subdural bleeding.  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  On  January 31, she was given a dose of WinRho anti-D immunoglobulin 75  mcg/kg IV over 20 minutes.  Parenteral steroids were started with an  initial dose of 80 mg of Solu-Medrol and subsequently 60 mg daily.  Coincident with this treatment, her platelet count stabilized.  Platelet  count fell as low as 24,00 on 2/1, was 25,000 on February 3; on February  4, 29,000 and on day of discharge, February 5, 44,000.  She had no  clinical bleeding or progression of neurologic changes over the interval  when her platelet count had fallen except for the above-mentioned low-  grade, pulmonary hemorrhage.   Cardiology was consulted on the morning after her cardiac  decompensation.  She was evaluated by Dr. Verdis Prime.  Her mitral valve  heart sounds were normal.  An echocardiogram did not show any evidence  for any clot on the valve.  Left ventricular ejection fraction estimated  at 55%.  There were no wall motion abnormalities.  There was moderate  paradoxical motion of the intraventricular septum.   Most recent CT scan of the brain done the day before discharge, July 31, 2007, showed stable left, parietal subdural hematoma.   Serum creatinine did rise to 1.6 at the time that she went into heart  failure and stayed at 1.6 up until the hospital discharge.  Lisinopril  was stopped and substituted with Norvasc.  Further adjustments in  medications will be made by her cardiologist as an outpatient.   Electrocardiogram on admission showed normal sinus rhythm with  occasional premature atrial complexes,  right axis deviation and Q-waves  in lead 1 and V1.  Followup study when she developed CHF on July 24, 2007, showed no changes.   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  She brought in paperwork to designate her brother Amzie Sillas, as her health care power of attorney if she could not speak for  herself.  She felt that her brother would be more objective about end-of-  life decisions then her husband might be.   CONSULTATIONS:  1. Neurology.  2. Neurosurgery.  3. Cardiology.  4. Critical care medicine.   PROCEDURES:  1. Blood and platelet transfusion.  2. Administration of activated clotting factors and fresh frozen      plasma to reverse coagulopathy.  3. Echocardiogram.   DISCHARGE DIAGNOSES:  1. Recurrent left parietal, subdural hematoma.  2. Anaphylactic reaction to parenteral vitamin K.  3. Low-grade pulmonary hemorrhage likely  secondary to disseminated      intravascular coagulation (DIC).  4. Acute congestive heart failure likely due to volume overload.  5. Acute thrombocytopenia superimposed on chronic, likely secondary to      disseminated intravascular coagulation (DIC), rule out idiopathic      thrombocytopenic purpura (ITP).  6. Antiphospholipid antibody syndrome.  7. History of transient ischemic attack (TIA), amaurosis fugax and      right brain infarct secondary to antiphospholipid antibody      syndrome.  8. Two years status post placement of St. Jude mechanical mitral valve      with concomitant tricuspid annuloplasty due to valve destruction      related to antiphospholipid antibody syndrome.  9. Essential hypertension.   10.Hypothyroid on replacement.  11.Mild acute renal insufficiency.  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   CONDITION ON DISCHARGE:  Stable at time of discharge.  No focal  neurologic deficits.  Stable CT scan with residual subdural hematoma in  left parietal region, unchanged from admission study with some slight improvement.  Chest x-ray with resolving pulmonary infiltrates due to  congestive failure.  Platelet count now rising into a safe range.   ACTIVITY:  She will resume limited activity at home.   DIET:  Regular diet.   FOLLOW UP:  Report to my office tomorrow for followup blood counts and  again on Monday for blood counts and exam.   DISCHARGE MEDICATIONS:  1. Prednisone 60 mg daily.  2. Keppra 1000 mg b.i.d.  3. Synthroid 0.112 mg daily.  4. Norvasc 5 mg daily.  5. She will stop Coumadin and hold all anticoagulation until further      instruction.      Genene Churn. Cyndie Chime, M.D.  Electronically Signed     JMG/MEDQ  D:  08/02/2007  T:  08/03/2007  Job:  [id number]   cc:   [name], M.D.  Evie Lacks, MD  Payton Doughty, M.D.  Oley Balm Sung Amabile, MD  Lyn Records, M.D.  Bonnita Hollow, M.D.  Misty Stanley, M.D.

## 2010-11-08 NOTE — Letter (Signed)
Nov 01, 2009    [facility].   RE:  [name], BRILEY  MRN:  [id number]  /  DOB:  07-02-1945   To Whom It May Concern:   I am writing this letter to request insurance coverage for Dr. Pershing Cox, MD to have electrophysiology evaluation and catheter  ablation for atypical atrial flutter to be performed at [facility]  [place] [facility] by Dr. Eloisa Northern.   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  I was initially  introduced to [name] during her hospitalization December 2010 at  which time she presented with very symptomatic atrial tachycardia and  atypical atrial flutter with rapid ventricular rates and decompensated  heart failure due to her atrial arrhythmias.  She failed medical therapy  with Cardizem, Multaq, and subsequently amiodarone.  She developed  prolonged QT interval and torsade de pointes with near syncope while  taking amiodarone.  Due to her prolonged QT intervals, I did not feel  that she is a candidate for Tikosyn or sotalol therapy.  She is  chronically anticoagulated with a very difficult regimen of Arixtra for  which she is followed by Dr. Cyndie Chime.  She clearly needs catheter  ablation for her atrial arrhythmias.   Unfortunately, catheter ablation would be quite complicated as Dr  [name] has a mechanical mitral valve, heparin-induced thrombocytopenia,  antiphospholipid antibody syndrome, renal failure, and intracranial  bleeding as described above.  I feel that at this time catheter ablation  at our institution  would be overly complicated by her complex medical  history and have therefore advised that she have her ablation performed  at a [facility].  I am very familiar with  electrophysiology capabilities within our region and do not feel that  she is a candidate for catheter ablation within our region.  Dr. Eloisa Northern at the [facility] [place] [facility] [place], has significant  experience in catheter ablation of patients who are as complicated as  [name].  Specifically, he has handled multiple patients with  heparin-induced thrombocytopenia who cannot receive intravenous heparin  during left atrial catheterization.  He has performed ablation on  multiple patients with antiphospholipid antibody syndrome and has used  alternative anticoagulants during their catheter ablation.  He has used  multiple alternative agents including bivalirudin during catheter  ablation and therefore would be the ideal physician to perform her  catheter ablation.  Unfortunately, Dr. Argentina Ponder financial situation  would not allow her to have the procedure performed at [facility] as an out of network patient.  I therefore request that you  give special consideration and allow [name] to have her procedure  performed at in as if she were in network despite having her procedure  performed at [facility] [place] [facility].  Please feel free to  contact me if I can provide further helpful information.  I greatly  appreciate your assistance as I provide care to this kind lady.    Sincerely,      Hillis Range, MD  Electronically Signed    JA/MedQ  DD: 11/01/2009  DT: 11/02/2009  Job #: [id number]

## 2010-11-08 NOTE — H&P (Signed)
NAMEDYLYNN, KETNER NO.:  [id number]   MEDICAL RECORD NO.:  [id number]          PATIENT TYPE:  INP   LOCATION:  3112                         FACILITY:  [facility]   PHYSICIAN:  Genene Churn. Granfortuna, M.D.DATE OF BIRTH:  12/31/1945   DATE OF ADMISSION:  07/24/2007  DATE OF DISCHARGE:                              HISTORY & PHYSICAL   REASON FOR ADMISSION:  Acute recurrent subdural hematoma in a lady on  Coumadin anticoagulation.   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  She has had episodes of  previous amaurosis fugax and cerebrovascular accidents in the past.  She  has chronic moderate thrombocytopenia with a platelet count running  around 100,000.  She was on chronic Coumadin and aspirin  anticoagulation.  She developed progressive mitral valve dysfunction and  tricuspid valve dysfunction, likely directly related to the  antiphospholipid antibody syndrome.  She developed acute congestive  heart failure, requiring emergency mitral valve replacement and  concomitant tricuspid valve annuloplasty at [facility]  in October 2007.  Hospital course was complicated by initial DIC and  profound thrombocytopenia as well as acute renal insufficiency due to  low cardiac flow.  She survived the surgery only to have a stroke in the  recovery room with a right brain infarct resulting in dysarthria and  left hemiparesis.  She has made a remarkable recovery with the only  residual neurologic deficits limited to her left hand, which is weaker  and less coordinated than the right hand.   She was on a stable dose of anticoagulation when she went down to  [place], [place], in late December to assist with her father, who  was ill and subsequently died during that visit.  Her diet was erratic.  She did have a pro time checked and was found to have an INR of 5.6.  Right around the same  time, she developed headache and low back pain and  then acute onset of change in mental status with confusion and  expressive aphasia.  Her husband brought her to the [facility] in [place].  A CT scan of the brain showed a large acute  subdural hematoma  the left frontotemporal area.  Coumadin was reversed  with vitamin K and fresh frozen plasma.  She underwent an emergency  craniotomy and drainage of subdural hematoma by Dr. Marrianne Mood on  June 18, 2007.  Once again,  she had a remarkable recovery.  She was  evaluated by Dr. Bonnita Hollow, a professor at hematology at the  [facility].  She was cautiously started back on low-dose  anticoagulation with Arixtra (fondaparinux), which is a Factor Xa  inhibitor which does not cross-react with heparin.  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   Six days following the surgical drainage procedure, she developed an  episode of transient expressive aphasia and was brought back to the  [facility], where a  CT scan showed a small amount of fresh  hemorrhage.  She was neurologically intact at that point and no  additional surgery was needed.  She was kept on the Arixtra 2.5 mg.  She  recuperated for another week in [place] and then returned to [place].  I saw her on July 03, 2007, with subsequent visits weekly.  I kept her  on the Arixtra alone for the first 2 weeks and then cautiously resumed  her Coumadin, initially at a dose of 2.5 mg on the first day, and then 5  mg for 1 week and most recently dose increased to 7.5 mg daily.  INR was  2.4 with a pro time of 26.5 seconds this Monday, July 22, 2007.  I  stopped the Arixtra at that point.   The patient called me this morning to report a 24- to 36-hour history of  persistent diffuse headache and low back pain  very similar in quality to  the headache and back pain that she had when she had her subdural  hematoma diagnosed.  She was advised to come in for emergency  evaluation.  Unfortunately, a CT scan which I have personally reviewed  with the radiologist and neurosurgeon, shows a new area of subdural  hemorrhage.  Current hematoma is in the left parietal region.   She is admitted at this time for urgent reversal of Coumadin and  observation of her neurologic status.   PAST MEDICAL HISTORY:  As noted above.  Also includes chronic migraine  headaches.  Hypothyroidism, on replacement.  No prior MI.  She has  essential hypertension.  Known abnormal cardiogram with right axis  deviation   Dictation ends at this point.  ***please combine this dictation with the  rest of the report!! as requested      Genene Churn. Cyndie Chime, M.D.  Electronically Signed     JMG/MEDQ  D:  07/24/2007  T:  07/24/2007  Job:  [id number]

## 2010-11-22 ENCOUNTER — Other Ambulatory Visit: Payer: Self-pay | Admitting: *Deleted

## 2010-11-22 MED ORDER — DILTIAZEM HCL ER COATED BEADS 240 MG PO CP24: 240.0000 mg | ORAL_CAPSULE | Freq: Every day | ORAL | Status: DC

## 2011-01-02 ENCOUNTER — Other Ambulatory Visit: Payer: Self-pay | Admitting: Oncology

## 2011-01-02 ENCOUNTER — Encounter (HOSPITAL_BASED_OUTPATIENT_CLINIC_OR_DEPARTMENT_OTHER): Payer: Medicare PPO | Admitting: Oncology

## 2011-01-02 DIAGNOSIS — R894 Abnormal immunological findings in specimens from other organs, systems and tissues: Secondary | ICD-10-CM

## 2011-01-02 DIAGNOSIS — N189 Chronic kidney disease, unspecified: Secondary | ICD-10-CM

## 2011-01-02 DIAGNOSIS — D6859 Other primary thrombophilia: Secondary | ICD-10-CM

## 2011-01-02 LAB — CBC WITH DIFFERENTIAL/PLATELET
Basophils Absolute: 0.1 10*3/uL (ref 0.0–0.1)
EOS%: 5.5 % (ref 0.0–7.0)
Eosinophils Absolute: 0.3 10*3/uL (ref 0.0–0.5)
HGB: 10.4 g/dL — ABNORMAL LOW (ref 11.6–15.9)
MCH: 32.1 pg (ref 25.1–34.0)
MCHC: 33.9 g/dL (ref 31.5–36.0)
NEUT#: 3.4 10*3/uL (ref 1.5–6.5)
NEUT%: 65.9 % (ref 38.4–76.8)
RBC: 3.24 10*6/uL — ABNORMAL LOW (ref 3.70–5.45)
RDW: 13 % (ref 11.2–14.5)
WBC: 5.2 10*3/uL (ref 3.9–10.3)
lymph#: 1.1 10*3/uL (ref 0.9–3.3)

## 2011-01-03 LAB — COMPREHENSIVE METABOLIC PANEL
AST: 21 U/L (ref 0–37)
Albumin: 4.2 g/dL (ref 3.5–5.2)
BUN: 28 mg/dL — ABNORMAL HIGH (ref 6–23)
Calcium: 9 mg/dL (ref 8.4–10.5)
Chloride: 107 mEq/L (ref 96–112)
Glucose, Bld: 130 mg/dL — ABNORMAL HIGH (ref 70–99)
Potassium: 4.3 mEq/L (ref 3.5–5.3)
Sodium: 142 mEq/L (ref 135–145)
Total Protein: 6.6 g/dL (ref 6.0–8.3)

## 2011-01-03 LAB — LACTATE DEHYDROGENASE: LDH: 306 U/L — ABNORMAL HIGH (ref 94–250)

## 2011-01-05 ENCOUNTER — Telehealth: Payer: Self-pay | Admitting: Internal Medicine

## 2011-01-05 NOTE — Telephone Encounter (Signed)
Pt calling re going in and out of rythmn

## 2011-01-06 NOTE — Telephone Encounter (Signed)
Patient came in for EKG and Dr Johney Frame saw her  She is going to see her back in follow up on 01/16/11 at 11:45

## 2011-01-06 NOTE — Telephone Encounter (Signed)
Discussed with Dr Johney Frame pt to come in and get EKG to determine HR

## 2011-01-10 ENCOUNTER — Encounter: Payer: Self-pay | Admitting: Internal Medicine

## 2011-01-12 ENCOUNTER — Telehealth: Payer: Self-pay | Admitting: *Deleted

## 2011-01-12 ENCOUNTER — Encounter: Payer: Self-pay | Admitting: Internal Medicine

## 2011-01-12 NOTE — Telephone Encounter (Signed)
Called patient and spoke with her  Today she was driving had 3 episodes of profound dizziness  And now is okay now.  She is going to lay low until her appointment on Monday.  If she has dizziness or heart racing that is persistent she will call 911 and go to the ER

## 2011-01-12 NOTE — Telephone Encounter (Signed)
Pt has appt with Dr. Johney Frame on Monday, while driving today pt was having profound dizzy spells. Pt said she is in a-fib. Pt said she had to stop driving and have pt come rescue her. Please return pt call and advise.

## 2011-01-16 ENCOUNTER — Encounter: Payer: Self-pay | Admitting: *Deleted

## 2011-01-16 ENCOUNTER — Ambulatory Visit (INDEPENDENT_AMBULATORY_CARE_PROVIDER_SITE_OTHER): Payer: Medicare PPO | Admitting: Internal Medicine

## 2011-01-16 ENCOUNTER — Encounter: Payer: Self-pay | Admitting: Internal Medicine

## 2011-01-16 VITALS — BP 110/70 | HR 108 | Ht 63.0 in | Wt 148.0 lb

## 2011-01-16 DIAGNOSIS — I10 Essential (primary) hypertension: Secondary | ICD-10-CM

## 2011-01-16 DIAGNOSIS — I4892 Unspecified atrial flutter: Secondary | ICD-10-CM

## 2011-01-16 DIAGNOSIS — R42 Dizziness and giddiness: Secondary | ICD-10-CM

## 2011-01-16 DIAGNOSIS — Z01810 Encounter for preprocedural cardiovascular examination: Secondary | ICD-10-CM

## 2011-01-16 DIAGNOSIS — I498 Other specified cardiac arrhythmias: Secondary | ICD-10-CM

## 2011-01-16 DIAGNOSIS — I471 Supraventricular tachycardia: Secondary | ICD-10-CM

## 2011-01-16 DIAGNOSIS — I509 Heart failure, unspecified: Secondary | ICD-10-CM

## 2011-01-16 DIAGNOSIS — I5032 Chronic diastolic (congestive) heart failure: Secondary | ICD-10-CM

## 2011-01-16 LAB — CBC WITH DIFFERENTIAL/PLATELET
Basophils Absolute: 0 10*3/uL (ref 0.0–0.1)
HCT: 32.2 % — ABNORMAL LOW (ref 36.0–46.0)
Hemoglobin: 11.1 g/dL — ABNORMAL LOW (ref 12.0–15.0)
Lymphs Abs: 1 10*3/uL (ref 0.7–4.0)
MCHC: 34.4 g/dL (ref 30.0–36.0)
MCV: 94.7 fl (ref 78.0–100.0)
Monocytes Absolute: 0.3 10*3/uL (ref 0.1–1.0)
Neutro Abs: 3.1 10*3/uL (ref 1.4–7.7)
Platelets: 104 10*3/uL — ABNORMAL LOW (ref 150.0–400.0)
RDW: 12.9 % (ref 11.5–14.6)

## 2011-01-16 LAB — BASIC METABOLIC PANEL
BUN: 27 mg/dL — ABNORMAL HIGH (ref 6–23)
CO2: 27 mEq/L (ref 19–32)
Calcium: 8.8 mg/dL (ref 8.4–10.5)
GFR: 36 mL/min — ABNORMAL LOW (ref >60.00)
Glucose, Bld: 88 mg/dL (ref 70–99)

## 2011-01-16 LAB — PROTIME-INR: Prothrombin Time: 11.6 s (ref 10.2–12.4)

## 2011-01-16 NOTE — Progress Notes (Signed)
The patient presents today for routine electrophysiology followup.  Since last being seen in our clinic, she has returned to atypical atrial flutter. She reports fatigue and decreased exercise tolerance.  She states that on Friday, she has brief dizziness while driving.  She denies presyncope or syncope.  She has had no recurrent events since that time. Today, she denies symptoms of chest pain, shortness of breath, orthopnea, PND, lower extremity edema, or neurologic sequela.  The patient feels that she is tolerating medications without difficulties and is otherwise without complaint today.   Past Medical History  Diagnosis Date  . Rheumatic heart disease 1995    post percutaneous valvuloplasty at [facility] with subsequent mechanical mitral valve replacement and tricuspid annuloplasty at [facility]  . Cerebrovascular accident     prior histories  . Subdural hematoma     on coumadin  . Antiphospholipid antibody syndrome   . Heparin-induced thrombocytopenia   . HTN (hypertension)   . History of Hashimoto thyroiditis   . HLD (hyperlipidemia)   . Atrial flutter     atypical  . Other activity     Torsades with amiodarone   Past Surgical History  Procedure Date  . Mitral valve replacement 2007    with tricuspid annuloplasty  . Amputation     left atrial appendage amputated with MVR  . Craniotomy 2008    for SDH  . Total abdominal hysterectomy   . Cholecystectomy   . Tonsillectomy   . Percutaneous balloon valvuloplasty 1995    At [facility] with subsequent mechanical mitral valve replacement and tricuspid annuloplasty at [facility]    Current Outpatient Prescriptions  Medication Sig Dispense Refill  . acetaminophen (TYLENOL) 500 MG tablet Take by mouth as needed.        . AMOXICILLIN PO as directed. ONLY WHEN VISITING DENTIST      . Calcium-Magnesium (CAL-MAG PO) Take by mouth daily.        . Cholecalciferol (VITAMIN D) 2000 UNITS tablet Take 2,000 Units by mouth  daily.        Marland Kitchen diltiazem (CARDIZEM CD) 240 MG 24 hr capsule Take 1 capsule (240 mg total) by mouth daily.  30 capsule  11  . Estradiol Acetate (FEMRING) 0.05 MG/24HR RING daily.        . fish oil-omega-3 fatty acids 1000 MG capsule daily.        . fondaparinux (ARIXTRA) 2.5 MG/0.5ML SOLN Inject 2.5 mg into the skin every other day.        . furosemide (LASIX) 20 MG tablet Take 20 mg by mouth as needed.        Marland Kitchen levothyroxine (SYNTHROID, LEVOTHROID) 75 MCG tablet Take 75 mcg by mouth daily.        . NON FORMULARY Suprema dophilus UAD      . Polyethylene Glycol 3350 POWD 17 g by Does not apply route daily as needed. For constipation.       . potassium chloride (MICRO-K) 10 MEQ CR capsule Take 10 mEq by mouth as needed.        . rosuvastatin (CRESTOR) 5 MG tablet Take 5 mg by mouth daily.        . valACYclovir (VALTREX) 1000 MG tablet Take 1,000 mg by mouth as needed.          Allergies  Allergen Reactions  . Amiodarone Hcl   . Heparin   . Iodinated Diagnostic Agents   . Vitamin K     REACTION: Anaphylaxis ---  IV only allergy    History   Social History  . Marital Status: Married    Spouse Name: N/A    Number of Children: N/A  . Years of Education: N/A   Occupational History  . PHYSICIAN     retired   Social History Main Topics  . Smoking status: Never Smoker   . Smokeless tobacco: Never Used  . Alcohol Use: No  . Drug Use: No  . Sexually Active: Not on file   Other Topics Concern  . 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     Family History  Problem Relation Age of Onset  . Heart disease Father     late 6's early 80's-CABG    ROS-  All systems are reviewed and are negative except as outlined in the HPI above  Physical Exam: Filed Vitals:   01/16/11 1104  BP: 110/70  Pulse: 108  Height: 5\' 3"  (1.6 m)  Weight: 148 lb (67.132 kg)    GEN- The patient is well appearing, alert and oriented x 3 today.     Head- normocephalic, atraumatic Eyes-  Sclera clear, conjunctiva pink Ears- hearing intact Oropharynx- clear Neck- supple, no JVP Lymph- no cervical lymphadenopathy Lungs- Clear to ausculation bilaterally, normal work of breathing Heart- irregular rate and rhythm, no murmurs, rubs or gallops, crisp mechanical S1 PMI not laterally displaced GI- soft, NT, ND, + BS Extremities- no clubbing, cyanosis, or edema MS- no significant deformity or atrophy Skin- no rash or lesion Psych- euthymic mood, full affect Neuro- strength and sensation are intact  EKG today reveals atypical atrial flutter, V rate 85 bpm, LBBB  Assessment and Plan

## 2011-01-16 NOTE — Assessment & Plan Note (Signed)
Stable No change required today  

## 2011-01-16 NOTE — Assessment & Plan Note (Addendum)
Dr Carey Bullocks returns with symptomatic atachy/ atypical atrial flutter.  We discussed treatment options at length today.  Given her h/o intolerance to amiodarone and ineffective treatment with multaq, we will avoid AAD at this time.  Risks, benefits, and alternatives to TEE guided cardioversion were discussed at length with the patient.  She wishes to proceed.  We will therefore arrrange TEE guided cardioversion at the next available time.  If her arrhythmias return, I have advised that she return to [facility] to further discuss options with Dr Smith Robert.  Her anticoagulation scheme as outlined by Dr Cyndie Chime is Arixtra for which she has done very well for a long time.  She should continue this regimen at this time.

## 2011-01-16 NOTE — Assessment & Plan Note (Signed)
She had brief dizziness several days ago which have resolved. I think ventricular arrhythmia is unlikely.  Possibly due to bradycardia or dehydration. We will check labs today. We will pursue sinus rhythm and then re-evaluate. No driving until after cardioversion.

## 2011-01-16 NOTE — Patient Instructions (Signed)
Your physician recommends that you schedule a follow-up appointment in: 6 WEEKS WITH DR Johney Frame Your physician recommends that you continue on your current medications as directed. Please refer to the Current Medication list given to you today.  Your physician has requested that you have a TEE/Cardioversion. During a TEE, sound waves are used to create images of your heart. It provides your doctor with information about the size and shape of your heart and how well your heart's chambers and valves are working. In this test, a transducer is attached to the end of a flexible tube that is guided down you throat and into your esophagus (the tube leading from your mouth to your stomach) to get a more detailed image of your heart. Once the TEE has determined that a blood clot is not present, the cardioversion begins. Electrical Cardioversion uses a jolt of electricity to your heart either through paddles or wired patches attached to your chest. This is a controlled, usually prescheduled, procedure. This procedure is done at the hospital and you are not awake during the procedure. You usually go home the day of the procedure. Please see the instruction sheet given to you today for more information. DX SVT Your physician recommends that you return for lab work in: TODAY BMET CBC PT PTT DX V72.81  427.0

## 2011-01-18 ENCOUNTER — Telehealth: Payer: Self-pay | Admitting: Internal Medicine

## 2011-01-18 NOTE — Telephone Encounter (Signed)
Per pt call, pt is having TEE done tomorrow. Pt has lost medicare card and pt was informed we would contact hospital and inform them that pt lost medicare card. Please return pt call with any questions.

## 2011-01-19 ENCOUNTER — Ambulatory Visit (HOSPITAL_COMMUNITY)
Admission: RE | Admit: 2011-01-19 | Discharge: 2011-01-19 | Disposition: A | Discharge status: Home / Self Care | Payer: Medicare PPO | Source: Ambulatory Visit | Attending: Cardiology | Admitting: Cardiology

## 2011-01-19 DIAGNOSIS — I4892 Unspecified atrial flutter: Secondary | ICD-10-CM | POA: Insufficient documentation

## 2011-01-19 NOTE — Progress Notes (Signed)
[facility]  NAME:  [name], [name]             ACCOUNT NO.:  [id number]  MEDICAL RECORD NO.:  [id number]      DATE OF BIRTH:  May 28, 1946  PHYSICIAN:  Madolyn Frieze. Jens Som, MD, [facility] VISIT DATE:  01/19/2011                                  OFFICE VISIT   This is cardioversion of atrial flutter.  The patient was sedated by anesthesia with Diprivan 60 mg intravenously.  Synchronized cardioversion with 120 joules resulted in sinus bradycardia.  There are no immediate complications.  We would recommend continuing all previous medications including Arixtra.  The patient will follow up with Dr. Johney Frame.     Madolyn Frieze Jens Som, MD, [facility]     BSC/MEDQ  D:  01/19/2011  T:  01/19/2011  Job:  [id number]

## 2011-01-24 NOTE — Telephone Encounter (Signed)
C/O rapid atrial tachycardia is back.

## 2011-01-24 NOTE — Telephone Encounter (Signed)
Spoke with patient and let her know Dr Johney Frame recommended that she call Dr Alda Lea in [facility] for an ablation  She verbalized the understanding

## 2011-01-26 ENCOUNTER — Ambulatory Visit: Payer: Medicare PPO | Admitting: Internal Medicine

## 2011-01-26 NOTE — Telephone Encounter (Addendum)
All Cardiac Records faxed to [facility]/Dr.Moncey @ [phone] (ok'd By Tresa Endo) 01/26/11/km

## 2011-01-27 ENCOUNTER — Encounter: Payer: Self-pay | Admitting: Internal Medicine

## 2011-02-06 ENCOUNTER — Ambulatory Visit (INDEPENDENT_AMBULATORY_CARE_PROVIDER_SITE_OTHER): Payer: Medicare PPO

## 2011-02-06 ENCOUNTER — Encounter: Payer: Self-pay | Admitting: Internal Medicine

## 2011-02-06 ENCOUNTER — Telehealth: Payer: Self-pay | Admitting: Internal Medicine

## 2011-02-06 DIAGNOSIS — I10 Essential (primary) hypertension: Secondary | ICD-10-CM

## 2011-02-06 DIAGNOSIS — I4891 Unspecified atrial fibrillation: Secondary | ICD-10-CM

## 2011-02-06 DIAGNOSIS — I4892 Unspecified atrial flutter: Secondary | ICD-10-CM

## 2011-02-06 DIAGNOSIS — I509 Heart failure, unspecified: Secondary | ICD-10-CM

## 2011-02-06 LAB — CBC WITH DIFFERENTIAL/PLATELET
Basophils Relative: 0.5 % (ref 0.0–3.0)
Eosinophils Absolute: 0.1 10*3/uL (ref 0.0–0.7)
Lymphocytes Relative: 12.5 % (ref 12.0–46.0)
MCHC: 34 g/dL (ref 30.0–36.0)
MCV: 94.8 fl (ref 78.0–100.0)
Monocytes Absolute: 0.4 10*3/uL (ref 0.1–1.0)
Neutrophils Relative %: 79.1 % — ABNORMAL HIGH (ref 43.0–77.0)
Platelets: 80 10*3/uL — ABNORMAL LOW (ref 150.0–400.0)
RBC: 3.65 Mil/uL — ABNORMAL LOW (ref 3.87–5.11)
WBC: 6.1 10*3/uL (ref 4.5–10.5)

## 2011-02-06 LAB — BASIC METABOLIC PANEL
BUN: 32 mg/dL — ABNORMAL HIGH (ref 6–23)
CO2: 27 mEq/L (ref 19–32)
Calcium: 8.7 mg/dL (ref 8.4–10.5)
Chloride: 100 mEq/L (ref 96–112)
Creatinine, Ser: 1.7 mg/dL — ABNORMAL HIGH (ref 0.4–1.2)

## 2011-02-06 NOTE — Telephone Encounter (Signed)
Per pt call pt was referred to Dr. Lennox Solders, electro-physicist  at [facility] by Dr. Johney Frame. Dr. Lennox Solders told pt that she needed to have an EKG done ASAP. PT was calling to see if EKG can be ordered and/or scheduled for pt. Please return pt call to advise/discuss.

## 2011-02-06 NOTE — Telephone Encounter (Signed)
Pt to come by today at 1pm for an EKG and will fax to [facility] for Dr Lennox Solders to see

## 2011-02-07 ENCOUNTER — Telehealth: Payer: Self-pay | Admitting: Internal Medicine

## 2011-02-07 NOTE — Telephone Encounter (Signed)
Pt aware of labs and sent to Dr Wadie Lessen are better

## 2011-02-07 NOTE — Telephone Encounter (Signed)
Pt cardioversion was cancelled pt is now in rhythm. Pt would like if someone can call her regarding her platelets and cret. Please return pt call.

## 2011-03-06 ENCOUNTER — Encounter: Payer: Self-pay | Admitting: Internal Medicine

## 2011-03-06 ENCOUNTER — Ambulatory Visit (INDEPENDENT_AMBULATORY_CARE_PROVIDER_SITE_OTHER): Payer: Medicare PPO | Admitting: Internal Medicine

## 2011-03-06 DIAGNOSIS — N189 Chronic kidney disease, unspecified: Secondary | ICD-10-CM

## 2011-03-06 DIAGNOSIS — I10 Essential (primary) hypertension: Secondary | ICD-10-CM

## 2011-03-06 DIAGNOSIS — I498 Other specified cardiac arrhythmias: Secondary | ICD-10-CM

## 2011-03-06 DIAGNOSIS — I5023 Acute on chronic systolic (congestive) heart failure: Secondary | ICD-10-CM

## 2011-03-06 DIAGNOSIS — I471 Supraventricular tachycardia: Secondary | ICD-10-CM

## 2011-03-06 NOTE — Assessment & Plan Note (Signed)
Stable No change required today  

## 2011-03-06 NOTE — Progress Notes (Signed)
The patient presents today for routine electrophysiology followup.  Since last being seen in our clinic, the patient reports doing reasonably well.  She underwent EPS and ablation at [facility].  Per Dr Smith Robert, she had an atriotomy flutter along the RA which was ablated.  She did well initially but has subsequently developed recurrent atypical atrial flutter. She reports increasing lower extremity edema as well as decreased exercise tolerance. Today, she denies symptoms of chest pain, shortness of breath, dizziness, presyncope, syncope, or neurologic sequela.  The patient feels that she is tolerating medications without difficulties and is otherwise without complaint today.   Past Medical History  Diagnosis Date  . Rheumatic heart disease 1995    post percutaneous valvuloplasty at [facility] with subsequent mechanical mitral valve replacement and tricuspid annuloplasty at [facility]  . Cerebrovascular accident     prior histories  . Subdural hematoma     on coumadin  . Antiphospholipid antibody syndrome   . Heparin-induced thrombocytopenia   . HTN (hypertension)   . History of Hashimoto thyroiditis   . HLD (hyperlipidemia)   . Atrial flutter     atypical  . Other activity     Torsades with amiodarone   Past Surgical History  Procedure Date  . Mitral valve replacement 2007    with tricuspid annuloplasty  . Amputation     left atrial appendage amputated with MVR  . Craniotomy 2008    for SDH  . Total abdominal hysterectomy   . Cholecystectomy   . Tonsillectomy   . Percutaneous balloon valvuloplasty 1995    At [facility] with subsequent mechanical mitral valve replacement and tricuspid annuloplasty at [facility]  . Atrial ablation surgery     CTI and R atriostomy scar flutter ablation at [facility] 8/12    Current Outpatient Prescriptions  Medication Sig Dispense Refill  . acetaminophen (TYLENOL) 500 MG tablet Take by mouth as needed.        . AMOXICILLIN PO as directed. ONLY  WHEN VISITING DENTIST      . Calcium-Magnesium (CAL-MAG PO) Take by mouth daily.        . Cholecalciferol (VITAMIN D) 2000 UNITS tablet Take 2,000 Units by mouth daily.        Marland Kitchen diltiazem (CARDIZEM CD) 240 MG 24 hr capsule Take 240 mg by mouth 2 (two) times daily.        . Estradiol Acetate (FEMRING) 0.05 MG/24HR RING daily.        . fish oil-omega-3 fatty acids 1000 MG capsule daily.        . fondaparinux (ARIXTRA) 2.5 MG/0.5ML SOLN Inject 2.5 mg into the skin every other day.        . furosemide (LASIX) 20 MG tablet Take 20 mg by mouth as needed.        Marland Kitchen levothyroxine (SYNTHROID, LEVOTHROID) 75 MCG tablet Take 75 mcg by mouth daily.        Marland Kitchen lisinopril (PRINIVIL,ZESTRIL) 5 MG tablet Take 5 mg by mouth daily.        . Multiple Vitamins-Minerals (MULTIVITAMIN WITH MINERALS) tablet Take 1 tablet by mouth daily.        . NON FORMULARY Suprema dophilus UAD      . valACYclovir (VALTREX) 1000 MG tablet Take 1,000 mg by mouth as needed.          Allergies  Allergen Reactions  . Amiodarone Hcl   . Heparin   . Iodinated Diagnostic Agents   . Vitamin K  REACTION: Anaphylaxis --- IV only allergy    History   Social History  . Marital Status: Married    Spouse Name: N/A    Number of Children: N/A  . Years of Education: N/A   Occupational History  . PHYSICIAN     retired   Social History Main Topics  . Smoking status: Never Smoker   . Smokeless tobacco: Never Used  . Alcohol Use: No  . Drug Use: No  . Sexually Active: Not on file   Other Topics Concern  . 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     Family History  Problem Relation Age of Onset  . Heart disease Father     late 32's early 80's-CABG    ROS-  All systems are reviewed and are negative except as outlined in the HPI above   Physical Exam: Filed Vitals:   03/06/11 1133  BP: 122/80  Pulse: 113  Height: 5\' 3"  (1.6 m)  Weight: 155 lb (70.308 kg)     GEN- The patient is well appearing, alert and oriented x 3 today.   Head- normocephalic, atraumatic Eyes-  Sclera clear, conjunctiva pink Ears- hearing intact Oropharynx- clear Neck- supple, no JVP Lymph- no cervical lymphadenopathy Lungs- Clear to ausculation bilaterally, normal work of breathing Heart- tachy irregular rate and rhythm,  Mechanical S1 GI- soft, NT, ND, + BS Extremities- no clubbing, cyanosis, 1+ BLE edema MS- no significant deformity or atrophy Skin- no rash or lesion Psych- euthymic mood, full affect Neuro- strength and sensation are intact  ekg today reveals atypical atrial flutter vs atrial tachycardia, V rate 113, LBBB  Assessment and Plan:

## 2011-03-06 NOTE — Assessment & Plan Note (Signed)
Restart lasix 20mg  daily BMET tomorrow by nephrologist

## 2011-03-06 NOTE — Assessment & Plan Note (Signed)
Atach/ Atypical atrial flutter with progressive lower extremity edema and fatigue  I have spoken with Dr Smith Robert at [facility] who agrees with repeat catheter ablation. He will contact Dr Carey Bullocks to arrange. Continue cardizem for rate control.  She states that she has not tolerated beta blockers in the past. No other changes today.

## 2011-03-06 NOTE — Assessment & Plan Note (Signed)
Recent echo at [facility] reveals EF 40-45% She is volume overloaded today I will add lasix 20mg  daily 2 gram sodium restriction She will follow-up with Dr Eliott Nine for BMET tomorrow. We may rechallenge with beta blockers in the future. No other changes today.

## 2011-03-06 NOTE — Patient Instructions (Signed)
Your physician recommends that you schedule a follow-up appointment in: 6 weeks with Dr Johney Frame  Restart Furosemide 20mg  daily today and have a BMP checked Dr Patsy Lager office  Cancel Echo

## 2011-03-07 ENCOUNTER — Other Ambulatory Visit: Payer: Self-pay | Admitting: Oncology

## 2011-03-07 ENCOUNTER — Encounter (HOSPITAL_BASED_OUTPATIENT_CLINIC_OR_DEPARTMENT_OTHER): Payer: Medicare PPO | Admitting: Oncology

## 2011-03-07 DIAGNOSIS — D6859 Other primary thrombophilia: Secondary | ICD-10-CM

## 2011-03-07 DIAGNOSIS — R894 Abnormal immunological findings in specimens from other organs, systems and tissues: Secondary | ICD-10-CM

## 2011-03-07 DIAGNOSIS — N189 Chronic kidney disease, unspecified: Secondary | ICD-10-CM

## 2011-03-07 LAB — CBC WITH DIFFERENTIAL/PLATELET
BASO%: 0.9 % (ref 0.0–2.0)
EOS%: 3.4 % (ref 0.0–7.0)
MCHC: 34.7 g/dL (ref 31.5–36.0)
MONO#: 0.2 10*3/uL (ref 0.1–0.9)
RBC: 3.32 10*6/uL — ABNORMAL LOW (ref 3.70–5.45)
RDW: 13.6 % (ref 11.2–14.5)
WBC: 4.9 10*3/uL (ref 3.9–10.3)
lymph#: 0.8 10*3/uL — ABNORMAL LOW (ref 0.9–3.3)

## 2011-03-07 LAB — BASIC METABOLIC PANEL
BUN: 37 mg/dL — ABNORMAL HIGH (ref 6–23)
CO2: 23 mEq/L (ref 19–32)
Chloride: 104 mEq/L (ref 96–112)
Glucose, Bld: 103 mg/dL — ABNORMAL HIGH (ref 70–99)
Potassium: 4 mEq/L (ref 3.5–5.3)

## 2011-03-07 LAB — HEPARIN ANTI-XA: Heparin LMW: 1.12 IU/mL

## 2011-03-16 LAB — COMPREHENSIVE METABOLIC PANEL
ALT: 17
ALT: 43 — ABNORMAL HIGH
AST: 37
AST: 53 — ABNORMAL HIGH
Albumin: 3.2 — ABNORMAL LOW
Albumin: 3.7
Alkaline Phosphatase: 68
BUN: 27 — ABNORMAL HIGH
CO2: 24
CO2: 26
CO2: 26
Calcium: 8.5
Calcium: 8.5
Chloride: 108
Chloride: 109
Chloride: 109
Creatinine, Ser: 1.07
Creatinine, Ser: 1.44 — ABNORMAL HIGH
Creatinine, Ser: 1.63 — ABNORMAL HIGH
GFR calc Af Amer: 39 — ABNORMAL LOW
GFR calc Af Amer: 45 — ABNORMAL LOW
GFR calc non Af Amer: 32 — ABNORMAL LOW
GFR calc non Af Amer: 37 — ABNORMAL LOW
GFR calc non Af Amer: 52 — ABNORMAL LOW
Glucose, Bld: 89
Potassium: 4.8
Total Bilirubin: 0.6
Total Bilirubin: 0.7
Total Bilirubin: 0.8

## 2011-03-16 LAB — BLOOD GAS, ARTERIAL
Acid-base deficit: 0.1
FIO2: 0.4
O2 Saturation: 88.7
TCO2: 24.4

## 2011-03-16 LAB — CBC
HCT: 21.9 — ABNORMAL LOW
HCT: 25.4 — ABNORMAL LOW
HCT: 27.1 — ABNORMAL LOW
HCT: 31.4 — ABNORMAL LOW
Hemoglobin: 10.8 — ABNORMAL LOW
Hemoglobin: 8.4 — ABNORMAL LOW
MCHC: 34.4
MCHC: 34.7
MCHC: 35.1
MCV: 92
MCV: 92.6
MCV: 92.6
MCV: 92.8
MCV: 92.9
Platelets: 37 — CL
Platelets: 62 — ABNORMAL LOW
Platelets: 90 — ABNORMAL LOW
RBC: 2.63 — ABNORMAL LOW
RBC: 2.64 — ABNORMAL LOW
RBC: 2.93 — ABNORMAL LOW
RBC: 3.39 — ABNORMAL LOW
WBC: 5
WBC: 5.5
WBC: 8.7
WBC: 9.3

## 2011-03-16 LAB — DIFFERENTIAL
Basophils Absolute: 0
Basophils Absolute: 0
Basophils Absolute: 0
Basophils Relative: 0
Basophils Relative: 0
Basophils Relative: 0
Eosinophils Absolute: 0
Eosinophils Absolute: 0.2
Eosinophils Absolute: 0.2
Eosinophils Relative: 0
Eosinophils Relative: 1
Eosinophils Relative: 3
Eosinophils Relative: 4
Lymphocytes Relative: 10 — ABNORMAL LOW
Lymphocytes Relative: 14
Lymphs Abs: 0.8
Lymphs Abs: 0.9
Monocytes Absolute: 0.3
Monocytes Absolute: 0.3
Monocytes Absolute: 0.4
Monocytes Relative: 4
Monocytes Relative: 6
Neutro Abs: 5.7
Neutrophils Relative %: 72
Neutrophils Relative %: 75
Neutrophils Relative %: 80 — ABNORMAL HIGH

## 2011-03-16 LAB — PROTIME-INR
INR: 0.9
INR: 2.6 — ABNORMAL HIGH
Prothrombin Time: 12.4
Prothrombin Time: 13.4
Prothrombin Time: 14.1
Prothrombin Time: 28.7 — ABNORMAL HIGH

## 2011-03-16 LAB — PREPARE FRESH FROZEN PLASMA

## 2011-03-16 LAB — DIRECT ANTIGLOBULIN TEST (NOT AT ARMC): DAT, complement: NEGATIVE

## 2011-03-16 LAB — TYPE AND SCREEN: ABO/RH(D): O NEG

## 2011-03-16 LAB — RETICULOCYTES
RBC.: 2.78 — ABNORMAL LOW
Retic Count, Absolute: 19.5
Retic Ct Pct: 0.7

## 2011-03-16 LAB — B-NATRIURETIC PEPTIDE (CONVERTED LAB): Pro B Natriuretic peptide (BNP): 1053 — ABNORMAL HIGH

## 2011-03-16 LAB — DIC (DISSEMINATED INTRAVASCULAR COAGULATION)PANEL
INR: 1
aPTT: 44 — ABNORMAL HIGH

## 2011-03-16 LAB — PREPARE PLATELET PHERESIS

## 2011-03-16 LAB — ANTIBODY SCREEN: Antibody Screen: NEGATIVE

## 2011-03-17 LAB — BASIC METABOLIC PANEL
CO2: 23
Calcium: 8.4
Chloride: 107
GFR calc Af Amer: 39 — ABNORMAL LOW
Glucose, Bld: 78
Potassium: 4.2
Sodium: 134 — ABNORMAL LOW

## 2011-03-17 LAB — CBC
HCT: 31.4 — ABNORMAL LOW
HCT: 31.9 — ABNORMAL LOW
Hemoglobin: 10.4 — ABNORMAL LOW
Hemoglobin: 10.6 — ABNORMAL LOW
Hemoglobin: 10.8 — ABNORMAL LOW
Hemoglobin: 10.9 — ABNORMAL LOW
Hemoglobin: 10.9 — ABNORMAL LOW
MCHC: 34.2
MCHC: 34.4
MCHC: 34.4
MCV: 91.9
MCV: 92.1
MCV: 92.1
RBC: 3.3 — ABNORMAL LOW
RBC: 3.33 — ABNORMAL LOW
RBC: 3.41 — ABNORMAL LOW
RBC: 3.45 — ABNORMAL LOW
RDW: 13.1
RDW: 13.4
RDW: 13.5
WBC: 7.6
WBC: 9.9

## 2011-03-17 LAB — URINE CULTURE: Culture: NO GROWTH

## 2011-03-17 LAB — COMPREHENSIVE METABOLIC PANEL
CO2: 25
Calcium: 8.3 — ABNORMAL LOW
Creatinine, Ser: 1.57 — ABNORMAL HIGH
GFR calc Af Amer: 41 — ABNORMAL LOW
GFR calc non Af Amer: 34 — ABNORMAL LOW
Glucose, Bld: 133 — ABNORMAL HIGH
Total Protein: 6.4

## 2011-03-20 LAB — URINALYSIS, ROUTINE W REFLEX MICROSCOPIC
Ketones, ur: 15 — AB
Protein, ur: 30 — AB
Urobilinogen, UA: 0.2

## 2011-03-20 LAB — DIFFERENTIAL
Lymphs Abs: 1.1
Monocytes Relative: 9
Neutro Abs: 3.8
Neutrophils Relative %: 67

## 2011-03-20 LAB — PROTIME-INR: INR: 0.9

## 2011-03-20 LAB — PHOSPHORUS: Phosphorus: 4.7 — ABNORMAL HIGH

## 2011-03-20 LAB — COMPREHENSIVE METABOLIC PANEL
ALT: 16
BUN: 31 — ABNORMAL HIGH
Calcium: 8.4
Calcium: 8.5
Creatinine, Ser: 1.76 — ABNORMAL HIGH
Creatinine, Ser: 1.54 — ABNORMAL HIGH
Glucose, Bld: 93
Glucose, Bld: 79
Sodium: 143
Total Protein: 6.8
Total Protein: 5.8 — ABNORMAL LOW

## 2011-03-20 LAB — CBC
Hemoglobin: 10.3 — ABNORMAL LOW
MCHC: 34.2
MCHC: 34.5
Platelets: 128 — ABNORMAL LOW
RBC: 3.72 — ABNORMAL LOW
RDW: 14.4
RDW: 14.3

## 2011-03-20 LAB — CARDIAC PANEL(CRET KIN+CKTOT+MB+TROPI)
Relative Index: INVALID
Total CK: 26
Total CK: 31
Troponin I: 0.01

## 2011-03-20 LAB — URINE MICROSCOPIC-ADD ON

## 2011-03-20 LAB — TROPONIN I: Troponin I: 0.02

## 2011-03-20 LAB — CK TOTAL AND CKMB (NOT AT ARMC)
CK, MB: 1.3
Relative Index: INVALID
Total CK: 37
Total CK: 40

## 2011-03-20 LAB — APTT: aPTT: 51 — ABNORMAL HIGH

## 2011-03-20 LAB — MAGNESIUM: Magnesium: 2.2

## 2011-03-22 ENCOUNTER — Other Ambulatory Visit: Payer: Self-pay | Admitting: Oncology

## 2011-03-22 ENCOUNTER — Encounter (HOSPITAL_BASED_OUTPATIENT_CLINIC_OR_DEPARTMENT_OTHER): Payer: Medicare PPO | Admitting: Oncology

## 2011-03-22 DIAGNOSIS — N189 Chronic kidney disease, unspecified: Secondary | ICD-10-CM

## 2011-03-22 DIAGNOSIS — D6859 Other primary thrombophilia: Secondary | ICD-10-CM

## 2011-03-22 DIAGNOSIS — R894 Abnormal immunological findings in specimens from other organs, systems and tissues: Secondary | ICD-10-CM

## 2011-03-22 LAB — BASIC METABOLIC PANEL
Calcium: 8.5 mg/dL (ref 8.4–10.5)
Creatinine, Ser: 1.59 mg/dL — ABNORMAL HIGH (ref 0.50–1.10)
Sodium: 142 mEq/L (ref 135–145)

## 2011-03-22 LAB — CBC WITH DIFFERENTIAL/PLATELET
EOS%: 2.7 % (ref 0.0–7.0)
LYMPH%: 11.7 % — ABNORMAL LOW (ref 14.0–49.7)
MCH: 32.3 pg (ref 25.1–34.0)
MCHC: 34 g/dL (ref 31.5–36.0)
MCV: 95 fL (ref 79.5–101.0)
MONO%: 5.4 % (ref 0.0–14.0)
RBC: 3.14 10*6/uL — ABNORMAL LOW (ref 3.70–5.45)
RDW: 13.7 % (ref 11.2–14.5)

## 2011-03-22 LAB — HEPARIN ANTI-XA: Heparin LMW: 0.72 IU/mL

## 2011-03-23 ENCOUNTER — Other Ambulatory Visit: Payer: Self-pay | Admitting: *Deleted

## 2011-03-23 MED ORDER — FUROSEMIDE 20 MG PO TABS: 20.0000 mg | ORAL_TABLET | ORAL | Status: DC | PRN

## 2011-04-03 ENCOUNTER — Other Ambulatory Visit (HOSPITAL_COMMUNITY): Payer: Medicare PPO | Admitting: Radiology

## 2011-04-24 ENCOUNTER — Encounter: Payer: Self-pay | Admitting: Internal Medicine

## 2011-04-24 ENCOUNTER — Ambulatory Visit (INDEPENDENT_AMBULATORY_CARE_PROVIDER_SITE_OTHER): Payer: Medicare PPO | Admitting: Internal Medicine

## 2011-04-24 DIAGNOSIS — I5023 Acute on chronic systolic (congestive) heart failure: Secondary | ICD-10-CM

## 2011-04-24 DIAGNOSIS — I498 Other specified cardiac arrhythmias: Secondary | ICD-10-CM

## 2011-04-24 DIAGNOSIS — I10 Essential (primary) hypertension: Secondary | ICD-10-CM

## 2011-04-24 NOTE — Assessment & Plan Note (Signed)
Doing well s/p ablation No changes today 

## 2011-04-24 NOTE — Patient Instructions (Signed)
Your physician wants you to follow-up in: 6 months with Dr Jacquiline Doe will receive a reminder letter in the mail two months in advance. If you don't receive a letter, please call our office to schedule the follow-up appointment.  Your physician recommends that you schedule a follow-up appointment next available  with Dr Jens Som

## 2011-04-24 NOTE — Progress Notes (Signed)
The patient presents today for routine electrophysiology followup.  Since last being seen in our clinic, the patient reports doing very well.  She has maintained sinus since her most recent catheter ablation at [facility] by Dr Smith Robert.  Her dypnsea/ fatigue and reduced exercise tolerance have resolved.  Today, she denies symptoms of palpitations, chest pain, shortness of breath, orthopnea, PND, lower extremity edema, dizziness, presyncope, syncope, or neurologic sequela.  The patient feels that she is tolerating medications without difficulties and is otherwise without complaint today.   Past Medical History  Diagnosis Date  . Rheumatic heart disease 1995    post percutaneous valvuloplasty at [facility] with subsequent mechanical mitral valve replacement and tricuspid annuloplasty at [facility]  . Cerebrovascular accident     prior histories  . Subdural hematoma     on coumadin  . Antiphospholipid antibody syndrome   . Heparin-induced thrombocytopenia   . HTN (hypertension)   . History of Hashimoto thyroiditis   . HLD (hyperlipidemia)   . Atrial flutter     atypical right atrial flutter s/p ablation at [facility] by Dr Smith Robert  . Other activity     Torsades with amiodarone   Past Surgical History  Procedure Date  . Mitral valve replacement 2007    with tricuspid annuloplasty  . Amputation     left atrial appendage amputated with MVR  . Craniotomy 2008    for SDH  . Total abdominal hysterectomy   . Cholecystectomy   . Tonsillectomy   . Percutaneous balloon valvuloplasty 1995    At [facility] with subsequent mechanical mitral valve replacement and tricuspid annuloplasty at [facility]  . Atrial ablation surgery     CTI and R atriostomy scar flutter ablation at [facility] 8/12,  repeat  ablation at [facility]  9/12    Current Outpatient Prescriptions  Medication Sig Dispense Refill  . acetaminophen (TYLENOL) 500 MG tablet Take by mouth as needed.        . AMOXICILLIN PO as directed. ONLY  WHEN VISITING DENTIST      . Calcium-Magnesium (CAL-MAG PO) Take by mouth daily.        . Cholecalciferol (VITAMIN D) 2000 UNITS tablet Take 2,000 Units by mouth daily.        . Estradiol Acetate (FEMRING) 0.05 MG/24HR RING daily.        . fish oil-omega-3 fatty acids 1000 MG capsule daily.        . fondaparinux (ARIXTRA) 2.5 MG/0.5ML SOLN Inject 2.5 mg into the skin every other day.        . levothyroxine (SYNTHROID, LEVOTHROID) 75 MCG tablet Take 75 mcg by mouth daily.        Marland Kitchen lisinopril (PRINIVIL,ZESTRIL) 5 MG tablet Take 5 mg by mouth daily.        . Multiple Vitamins-Minerals (MULTIVITAMIN WITH MINERALS) tablet Take 1 tablet by mouth daily.        . NON FORMULARY Suprema dophilus UAD      . rosuvastatin (CRESTOR) 5 MG tablet Take 5 mg by mouth daily.        . valACYclovir (VALTREX) 1000 MG tablet Take 1,000 mg by mouth as needed.        . furosemide (LASIX) 20 MG tablet Take 1 tablet (20 mg total) by mouth as needed.  30 tablet  6    Allergies  Allergen Reactions  . Amiodarone Hcl   . Heparin   . Iodinated Diagnostic Agents   . Vitamin K  REACTION: Anaphylaxis --- IV only allergy    History   Social History  . Marital Status: Married    Spouse Name: N/A    Number of Children: N/A  . Years of Education: N/A   Occupational History  . PHYSICIAN     retired   Social History Main Topics  . Smoking status: Never Smoker   . Smokeless tobacco: Never Used  . Alcohol Use: No  . Drug Use: No  . Sexually Active: Not on file   Other Topics Concern  . 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     Family History  Problem Relation Age of Onset  . Heart disease Father     late 61's early 80's-CABG   Physical Exam: Filed Vitals:   04/24/11 1150  BP: 144/74  Pulse: 57  Height: 5\' 3"  (1.6 m)  Weight: 151 lb (68.493 kg)    GEN- The patient is well appearing, alert and oriented x 3 today.   Head- normocephalic,  atraumatic Eyes-  Sclera clear, conjunctiva pink Ears- hearing intact Oropharynx- clear Neck- supple, no JVP Lymph- no cervical lymphadenopathy Lungs- Clear to ausculation bilaterally, normal work of breathing Heart- Regular rate and rhythm, crisp S1 GI- soft, NT, ND, + BS Extremities- no clubbing, cyanosis, trace edema  ekg today reveals sinus bradycardia 52 bpm, incomplete LBBB (QRS ), nonspecific St/T changes  Assessment and Plan:

## 2011-04-24 NOTE — Assessment & Plan Note (Signed)
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 With sinus rhythm, her CHF is much improved.  I would like to up titrate her lisinopril if creatinine will allow. She will discuss this with Dr Eliott Nine upon follow-up later this week.  No changes today.

## 2011-04-24 NOTE — Assessment & Plan Note (Signed)
Above goal today Increase lisinopril as above if ok with nephrology

## 2011-05-01 ENCOUNTER — Other Ambulatory Visit: Payer: Self-pay | Admitting: *Deleted

## 2011-05-01 ENCOUNTER — Other Ambulatory Visit: Payer: Self-pay | Admitting: Oncology

## 2011-05-01 ENCOUNTER — Other Ambulatory Visit (HOSPITAL_BASED_OUTPATIENT_CLINIC_OR_DEPARTMENT_OTHER): Payer: Medicare PPO | Admitting: Lab

## 2011-05-01 DIAGNOSIS — N189 Chronic kidney disease, unspecified: Secondary | ICD-10-CM

## 2011-05-01 DIAGNOSIS — R894 Abnormal immunological findings in specimens from other organs, systems and tissues: Secondary | ICD-10-CM

## 2011-05-01 DIAGNOSIS — D7582 Heparin induced thrombocytopenia (HIT): Secondary | ICD-10-CM

## 2011-05-01 DIAGNOSIS — Z8679 Personal history of other diseases of the circulatory system: Secondary | ICD-10-CM

## 2011-05-01 DIAGNOSIS — D6859 Other primary thrombophilia: Secondary | ICD-10-CM

## 2011-05-01 LAB — CBC WITH DIFFERENTIAL/PLATELET
BASO%: 0.8 % (ref 0.0–2.0)
Basophils Absolute: 0 10*3/uL (ref 0.0–0.1)
EOS%: 5 % (ref 0.0–7.0)
HCT: 32.4 % — ABNORMAL LOW (ref 34.8–46.6)
HGB: 11 g/dL — ABNORMAL LOW (ref 11.6–15.9)
MCHC: 34 g/dL (ref 31.5–36.0)
MONO#: 0.3 10*3/uL (ref 0.1–0.9)
NEUT%: 71 % (ref 38.4–76.8)
RDW: 13.5 % (ref 11.2–14.5)
WBC: 5 10*3/uL (ref 3.9–10.3)
lymph#: 0.8 10*3/uL — ABNORMAL LOW (ref 0.9–3.3)

## 2011-05-01 LAB — BASIC METABOLIC PANEL
BUN: 35 mg/dL — ABNORMAL HIGH (ref 6–23)
Chloride: 105 mEq/L (ref 96–112)
Potassium: 4.3 mEq/L (ref 3.5–5.3)
Sodium: 138 mEq/L (ref 135–145)

## 2011-05-02 ENCOUNTER — Other Ambulatory Visit: Payer: Self-pay | Admitting: *Deleted

## 2011-05-02 ENCOUNTER — Telehealth: Payer: Self-pay | Admitting: Oncology

## 2011-05-02 DIAGNOSIS — D7582 Heparin induced thrombocytopenia (HIT): Secondary | ICD-10-CM

## 2011-05-02 NOTE — Telephone Encounter (Signed)
S/w the pt and she refused to have the labd done based on the pt did not think she needed this lab appt.

## 2011-05-04 ENCOUNTER — Other Ambulatory Visit: Payer: Medicare PPO | Admitting: Lab

## 2011-05-04 ENCOUNTER — Other Ambulatory Visit: Payer: Self-pay | Admitting: *Deleted

## 2011-05-04 DIAGNOSIS — D7582 Heparin induced thrombocytopenia (HIT): Secondary | ICD-10-CM

## 2011-05-04 LAB — HEPARIN ANTI-XA: Heparin LMW: 0.87 IU/mL

## 2011-05-05 ENCOUNTER — Telehealth: Payer: Self-pay | Admitting: *Deleted

## 2011-05-05 NOTE — Telephone Encounter (Signed)
Notified pt per Dr. Cyndie Chime that heparin level OK.

## 2011-05-08 ENCOUNTER — Ambulatory Visit (INDEPENDENT_AMBULATORY_CARE_PROVIDER_SITE_OTHER): Payer: Medicare PPO | Admitting: Cardiology

## 2011-05-08 ENCOUNTER — Encounter: Payer: Self-pay | Admitting: Cardiology

## 2011-05-08 DIAGNOSIS — I498 Other specified cardiac arrhythmias: Secondary | ICD-10-CM

## 2011-05-08 DIAGNOSIS — N189 Chronic kidney disease, unspecified: Secondary | ICD-10-CM

## 2011-05-08 DIAGNOSIS — Z9889 Other specified postprocedural states: Secondary | ICD-10-CM

## 2011-05-08 DIAGNOSIS — I10 Essential (primary) hypertension: Secondary | ICD-10-CM

## 2011-05-08 DIAGNOSIS — I509 Heart failure, unspecified: Secondary | ICD-10-CM | POA: Insufficient documentation

## 2011-05-08 NOTE — Patient Instructions (Signed)
Your physician wants you to follow-up in: 6 MONTHS You will receive a reminder letter in the mail two months in advance. If you don't receive a letter, please call our office to schedule the follow-up appointment. 

## 2011-05-08 NOTE — Progress Notes (Signed)
YQM:VHQIONGE female for fu of rheumatic heart disease, atrial tachycardia, diastolic congestive heart failure. The patient  had mitral valvuloplasty at [facility] in the late 90s. In 2007 she had a mechanical mitral valve replacement at [facility]. 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 She's had a prior subdural hematoma that required evacuation. She also has a history of heparin-induced thrombocytopenia. Her last echocardiogram was performed in Oct 2011. This revealed an ejection fraction of 50-55%, a with normally functioning prosthetic mitral valve, moderate left atrial enlargement and mild right atrial enlargement. There was mild AI. S/P tricuspid valve repair. Transesophageal echocardiogram in July of 2012 showed an ejection fraction of 45-50%, status post mitral valve replacement with mild mitral regurgitation and mild left atrial enlargement. Note she is also followed by Dr. Johney Frame for SVT. She is now s/p ablation at [facility]; Also with h/o torsades with amiodarone. Since I last saw her, the patient denies any dyspnea on exertion, orthopnea, PND, pedal edema, palpitations, syncope or chest pain.   Current Outpatient Prescriptions  Medication Sig Dispense Refill  . acetaminophen (TYLENOL) 500 MG tablet Take by mouth as needed.        . AMOXICILLIN PO as directed. ONLY WHEN VISITING DENTIST      . Calcium-Magnesium (CAL-MAG PO) Take by mouth daily.        . Cholecalciferol (VITAMIN D) 2000 UNITS tablet Take 2,000 Units by mouth daily.        . Estradiol Acetate (FEMRING) 0.05 MG/24HR RING as directed.       . fish oil-omega-3 fatty acids 1000 MG capsule Take 1 g by mouth daily.       . fondaparinux (ARIXTRA) 2.5 MG/0.5ML SOLN Inject 2.5 mg into the skin every other day.        . levothyroxine (SYNTHROID, LEVOTHROID) 75 MCG tablet Take 75 mcg by mouth daily.        Marland Kitchen lisinopril  (PRINIVIL,ZESTRIL) 5 MG tablet Take 5 mg by mouth 2 (two) times daily.       . Multiple Vitamins-Minerals (MULTIVITAMIN WITH MINERALS) tablet Take 1 tablet by mouth daily.        . NON FORMULARY Suprema dophilus UAD      . rosuvastatin (CRESTOR) 5 MG tablet Take 5 mg by mouth daily.        . valACYclovir (VALTREX) 1000 MG tablet Take 1,000 mg by mouth as needed.           Past Medical History  Diagnosis Date  . Rheumatic heart disease 1995    post percutaneous valvuloplasty at [facility] with subsequent mechanical mitral valve replacement and tricuspid annuloplasty at [facility]  . Cerebrovascular accident     prior histories  . Subdural hematoma     on coumadin  . Antiphospholipid antibody syndrome   . Heparin-induced thrombocytopenia   . HTN (hypertension)   . History of Hashimoto thyroiditis   . HLD (hyperlipidemia)   . Atrial flutter     atypical right atrial flutter s/p ablation at [facility] by Dr Smith Robert  . Other activity     Torsades with amiodarone    Past Surgical History  Procedure Date  . Mitral valve replacement 2007    with tricuspid annuloplasty  . Amputation     left atrial appendage amputated with MVR  . Craniotomy 2008    for SDH  . Total abdominal hysterectomy   .  Cholecystectomy   . Tonsillectomy   . Percutaneous balloon valvuloplasty 1995    At [facility] with subsequent mechanical mitral valve replacement and tricuspid annuloplasty at [facility]  . Atrial ablation surgery     CTI and R atriostomy scar flutter ablation at [facility] 8/12,  repeat  ablation at [facility]  9/12    History   Social History  . Marital Status: Married    Spouse Name: N/A    Number of Children: N/A  . Years of Education: N/A   Occupational History  . PHYSICIAN     retired   Social History Main Topics  . Smoking status: Never Smoker   . Smokeless tobacco: Never Used  . Alcohol Use: No  . Drug Use: No  . Sexually Active: Not on file   Other Topics Concern  .  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     ROS: no fevers or chills, productive cough, hemoptysis, dysphasia, odynophagia, melena, hematochezia, dysuria, hematuria, rash, seizure activity, orthopnea, PND, pedal edema, claudication. Remaining systems are negative.  Physical Exam: Well-developed well-nourished in no acute distress.  Skin is warm and dry.  HEENT is normal.  Neck is supple. No thyromegaly.  Chest is clear to auscultation with normal expansion.  Cardiovascular exam is bradycardic, crisp mechanical valve sounds. Abdominal exam nontender or distended. No masses palpated. Extremities show no edema. neuro grossly intact

## 2011-05-08 NOTE — Assessment & Plan Note (Signed)
Continued SBE prophylaxis. 

## 2011-05-08 NOTE — Assessment & Plan Note (Signed)
Status post ablation. 

## 2011-05-08 NOTE — Assessment & Plan Note (Signed)
History of diastolic heart failure. Euvolemic on examination. She will continue diuretics as needed.

## 2011-05-08 NOTE — Assessment & Plan Note (Signed)
Blood pressure controlled. Continue present medications. 

## 2011-05-08 NOTE — Assessment & Plan Note (Addendum)
Followed by nephrology. 

## 2011-06-26 ENCOUNTER — Other Ambulatory Visit: Payer: Self-pay | Admitting: Oncology

## 2011-06-26 ENCOUNTER — Telehealth: Payer: Self-pay | Admitting: Oncology

## 2011-06-26 ENCOUNTER — Other Ambulatory Visit (HOSPITAL_BASED_OUTPATIENT_CLINIC_OR_DEPARTMENT_OTHER): Payer: Medicare PPO | Admitting: Lab

## 2011-06-26 DIAGNOSIS — N189 Chronic kidney disease, unspecified: Secondary | ICD-10-CM

## 2011-06-26 DIAGNOSIS — R894 Abnormal immunological findings in specimens from other organs, systems and tissues: Secondary | ICD-10-CM

## 2011-06-26 DIAGNOSIS — D6859 Other primary thrombophilia: Secondary | ICD-10-CM

## 2011-06-26 LAB — BASIC METABOLIC PANEL
BUN: 40 mg/dL — ABNORMAL HIGH (ref 6–23)
CO2: 25 mEq/L (ref 19–32)
Chloride: 108 mEq/L (ref 96–112)
Creatinine, Ser: 1.58 mg/dL — ABNORMAL HIGH (ref 0.50–1.10)
Glucose, Bld: 87 mg/dL (ref 70–99)
Potassium: 4.7 mEq/L (ref 3.5–5.3)

## 2011-06-26 LAB — CBC WITH DIFFERENTIAL/PLATELET
Basophils Absolute: 0 10*3/uL (ref 0.0–0.1)
Eosinophils Absolute: 0.2 10*3/uL (ref 0.0–0.5)
HCT: 31.9 % — ABNORMAL LOW (ref 34.8–46.6)
HGB: 10.9 g/dL — ABNORMAL LOW (ref 11.6–15.9)
LYMPH%: 21.1 % (ref 14.0–49.7)
MCHC: 34.2 g/dL (ref 31.5–36.0)
MONO#: 0.3 10*3/uL (ref 0.1–0.9)
NEUT%: 67.6 % (ref 38.4–76.8)
Platelets: 87 10*3/uL — ABNORMAL LOW (ref 145–400)
WBC: 4.3 10*3/uL (ref 3.9–10.3)
lymph#: 0.9 10*3/uL (ref 0.9–3.3)

## 2011-06-26 NOTE — Telephone Encounter (Signed)
Called pt back on her cell phone, left message regarding lab for today.

## 2011-08-22 ENCOUNTER — Ambulatory Visit: Payer: PRIVATE HEALTH INSURANCE

## 2011-08-22 ENCOUNTER — Ambulatory Visit (HOSPITAL_BASED_OUTPATIENT_CLINIC_OR_DEPARTMENT_OTHER): Payer: PRIVATE HEALTH INSURANCE | Admitting: Oncology

## 2011-08-22 ENCOUNTER — Telehealth: Payer: Self-pay | Admitting: *Deleted

## 2011-08-22 VITALS — BP 172/78 | HR 52 | Temp 96.8°F | Ht 63.0 in | Wt 155.2 lb

## 2011-08-22 DIAGNOSIS — D6861 Antiphospholipid syndrome: Secondary | ICD-10-CM

## 2011-08-22 DIAGNOSIS — N189 Chronic kidney disease, unspecified: Secondary | ICD-10-CM

## 2011-08-22 DIAGNOSIS — D6859 Other primary thrombophilia: Secondary | ICD-10-CM | POA: Diagnosis not present

## 2011-08-22 DIAGNOSIS — D689 Coagulation defect, unspecified: Secondary | ICD-10-CM

## 2011-08-22 DIAGNOSIS — I059 Rheumatic mitral valve disease, unspecified: Secondary | ICD-10-CM

## 2011-08-22 DIAGNOSIS — I69939 Monoplegia of upper limb following unspecified cerebrovascular disease affecting unspecified side: Secondary | ICD-10-CM

## 2011-08-22 DIAGNOSIS — I629 Nontraumatic intracranial hemorrhage, unspecified: Secondary | ICD-10-CM | POA: Insufficient documentation

## 2011-08-22 LAB — COMPREHENSIVE METABOLIC PANEL
AST: 28 U/L (ref 0–37)
Alkaline Phosphatase: 69 U/L (ref 39–117)
BUN: 29 mg/dL — ABNORMAL HIGH (ref 6–23)
Glucose, Bld: 97 mg/dL (ref 70–99)
Potassium: 4.2 mEq/L (ref 3.5–5.3)
Sodium: 141 mEq/L (ref 135–145)
Total Bilirubin: 0.4 mg/dL (ref 0.3–1.2)
Total Protein: 6.8 g/dL (ref 6.0–8.3)

## 2011-08-22 LAB — CBC WITH DIFFERENTIAL/PLATELET
EOS%: 3.6 % (ref 0.0–7.0)
Eosinophils Absolute: 0.2 10*3/uL (ref 0.0–0.5)
LYMPH%: 26.1 % (ref 14.0–49.7)
MCH: 31.5 pg (ref 25.1–34.0)
MCV: 94.1 fL (ref 79.5–101.0)
MONO%: 7.7 % (ref 0.0–14.0)
NEUT#: 2.6 10*3/uL (ref 1.5–6.5)
Platelets: 110 10*3/uL — ABNORMAL LOW (ref 145–400)
RBC: 3.67 10*6/uL — ABNORMAL LOW (ref 3.70–5.45)
RDW: 13.2 % (ref 11.2–14.5)

## 2011-08-22 LAB — HEPARIN ANTI-XA: Heparin LMW: 0.85 IU/mL

## 2011-08-22 NOTE — Telephone Encounter (Signed)
Message copied by Sabino Snipes on Tue Aug 22, 2011  5:18 PM ------      Message from: Levert Feinstein      Created: Tue Aug 22, 2011  5:13 PM       Call Dr Shela Commons :  Ronie Spies level good; creatinine stable @ 1.5

## 2011-08-22 NOTE — Telephone Encounter (Signed)
Pt notified that arixtra level good & creatinine stable per Dr.Granfortuna.

## 2011-08-22 NOTE — Progress Notes (Signed)
Hematology and Oncology Follow Up Visit  [name] [id number] August 22, 1945 66 y.o. 08/22/2011 6:25 PM   Principle Diagnosis: Encounter Diagnoses  Name Primary?  . Coagulopathy   . Antiphospholipid antibody with hypercoagulable state Yes  . CHRONIC KIDNEY DISEASE UNSPECIFIED   . Intracranial bleeding      Interim History:   Settled down for [name]. She had her second radiofrequency ablation procedure in September 2012 at [facility] to control refractory atrial fibrillation. She is status post a previous emergency mitral valve replacement in October of 2007. This procedure was very successful and she is no longer getting palpitations Her renal function has stabilized. Her platelet count remains stable. She's had no new cardiac or neurologic symptoms  Medications: reviewed  Allergies:  Allergies  Allergen Reactions  . Amiodarone Hcl   . Heparin   . Iodinated Diagnostic Agents   . Vitamin K     REACTION: Anaphylaxis --- IV only allergy    Review of Systems: Constitutional:   No constitutional symptoms Respiratory: No cough or dyspnea Cardiovascular:  No chest pain or palpitations Gastrointestinal: No change in bowel habit Genito-Urinary: Not questioned Musculoskeletal: No muscle or bone pain Neurologic: No headache no focal weakness Skin: No rash or ecchymosis Remaining ROS negative.  Physical Exam: Blood pressure 172/78, pulse 52, temperature 96.8 F (36 C), temperature source Oral, height 5\' 3"  (1.6 m), weight 155 lb 3.2 oz (70.398 kg). Wt Readings from Last 3 Encounters:  08/22/11 155 lb 3.2 oz (70.398 kg)  05/08/11 157 lb (71.215 kg)  04/24/11 151 lb (68.493 kg)     General appearance: A thin Caucasian woman HENNT: Pharynx no erythema or exudate Lymph nodes: No axillary adenopathy Breasts: Not examined Lungs: Clear to auscultation resonant to percussion Heart: regular rhythm with a 1-2/6 systolic murmur left sternal border; mechanical heart  valve sounds Abdomen: Nontender no mass no organomegaly Extremities: No edema Vascular: No cyanosis Neurologic: Mental status intact, cranial nerves grossly normal, pupils equal round reactive to light, optic discs are sharp no hemorrhage or exudate, motor strength is 5 over 5 all extremities with some residual weakness and clumsiness of the left hand Skin: No rash or ecchymosis  Lab Results: Lab Results  Component Value Date   WBC 4.2 08/22/2011   HGB 11.6 08/22/2011   HCT 34.6* 08/22/2011   MCV 94.1 08/22/2011   PLT 110* 08/22/2011     Chemistry      Component Value Date/Time   NA 141 08/22/2011 1317   K 4.2 08/22/2011 1317   CL 105 08/22/2011 1317   CO2 28 08/22/2011 1317   BUN 29* 08/22/2011 1317   CREATININE 1.53* 08/22/2011 1317      Component Value Date/Time   CALCIUM 9.0 08/22/2011 1317   ALKPHOS 69 08/22/2011 1317   AST 28 08/22/2011 1317   ALT 23 08/22/2011 1317   BILITOT 0.4 08/22/2011 1317    Peak low molecular weight heparin level on Arixtra 2.5 mg every other day is 0.85 units which is acceptable   Impression and Plan:  IMPRESSION:   1. Antiphospholipid antibody syndrome, status post 2 arterial embolic events, amaurosis fugax, and a transient ischemic attack prior to mitral valve failure requiring emergency replacement.  2. 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  She was on chronic Coumadin anticoagulation until she had recurrent subdural hemorrhage in the brain.  She is now maintained on Arixtra 2.5 mg subcutaneous every other day with good peak levels and  a good clinical results with no thrombotic events, and no bleeding events. Plan:  Continue the same indefinitely. 3  Mitral valve disease.  May be related to the antiphospholipid antibody syndrome.  Acute valve failure requiring emergency replacement when she presented with congestive heart failure and went into DIC in October 2007. 4  Atrial fibrillation, recurrent, post  cardioversion. Now status post radiofrequency ablation with excellent result.   5  Essential hypertension. 6  Hypothyroid on replacement. 7  History of heparin-induced thrombocytopenia without thrombosis following an elective hysterectomy in the past. 8  Chronic renal insufficiency, renal function currently stable.   CC:. Dr. Marden Noble; Olga Millers; Camille Bal; Fayrene Fearing love   Levert Feinstein, MD 2/26/20136:25 PM

## 2011-09-11 ENCOUNTER — Encounter: Payer: Self-pay | Admitting: Oncology

## 2011-09-11 NOTE — Letter (Signed)
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  She had a history of recurrent transient ischemic attacks and was on chronic Coumadin anticoagulation in the past.  The antiphospholipid syndrome caused damage to her mitral valve requiring emergency mechanical valve replacement in October 2007.  She suffered a right hemispheric stroke preoperatively when anticoagulation was briefly held.  When she went back on Coumadin she had 2 episodes of subdural brain hemorrhage.  I felt it was unsafe to challenge her with Coumadin for a third time.  In view of previous history of heparin induced thrombocytopenia following elective hysterectomy, she was not a candidate for unfractionated heparin.  She was started on fondaparinux 2.5 mg every other day in view of chronic renal insufficiency.  She has been stable on this drug with no thrombotic events, now out over 4 years from starting the Arixtra in February 2009.  I am monitoring periodic low molecular weight heparin levels.  I appreciate your giving the patient a temporary supply of the drug.  I respectfully request that you continue to supply her with this valuable agent.  Sincerely,    Levert Feinstein, M.D., F.A.C.P.  JMG/MEDQ  D:  08/27/2011  T:  08/27/2011  Job:  [id number]

## 2011-09-18 ENCOUNTER — Encounter: Payer: Self-pay | Admitting: Oncology

## 2011-09-20 ENCOUNTER — Telehealth: Payer: Self-pay | Admitting: Cardiology

## 2011-09-20 NOTE — Telephone Encounter (Signed)
Spoke with pt, Aware of dr crenshaw's recommendations.  °

## 2011-09-20 NOTE — Telephone Encounter (Signed)
New msg: Pt calling wanting to speak with nurse/MD regarding pt being recently prescribed 500 MG cipro for UTI. Please return pt call to discuss further.

## 2011-09-20 NOTE — Telephone Encounter (Signed)
Spoke with pt, she was recently put on cipro 500 mg for UTI. She has a history of long QT and V-tach and the package insert said to contact your md if you have every had long QT and she wants to make sure ok to take. Will forward for dr Jens Som review

## 2011-09-20 NOTE — Telephone Encounter (Signed)
Would not use ciprofloxacin; would  Have her call her primary care for antibiotic that doesn't prolong QT - septra DS if not allergic to sulfa might be a good choice [name]

## 2011-09-21 ENCOUNTER — Other Ambulatory Visit: Payer: Self-pay | Admitting: Gastroenterology

## 2011-09-21 DIAGNOSIS — Z800 Family history of malignant neoplasm of digestive organs: Secondary | ICD-10-CM

## 2011-09-28 ENCOUNTER — Other Ambulatory Visit: Payer: Self-pay | Admitting: Obstetrics and Gynecology

## 2011-09-28 DIAGNOSIS — Z1231 Encounter for screening mammogram for malignant neoplasm of breast: Secondary | ICD-10-CM

## 2011-10-16 ENCOUNTER — Ambulatory Visit
Admission: RE | Admit: 2011-10-16 | Discharge: 2011-10-16 | Disposition: A | Discharge status: Home / Self Care | Payer: Medicare Other | Source: Ambulatory Visit | Attending: Obstetrics and Gynecology | Admitting: Obstetrics and Gynecology

## 2011-10-16 ENCOUNTER — Telehealth: Payer: Self-pay | Admitting: *Deleted

## 2011-10-16 ENCOUNTER — Other Ambulatory Visit (HOSPITAL_BASED_OUTPATIENT_CLINIC_OR_DEPARTMENT_OTHER): Payer: Medicare Other | Admitting: Lab

## 2011-10-16 DIAGNOSIS — D689 Coagulation defect, unspecified: Secondary | ICD-10-CM

## 2011-10-16 DIAGNOSIS — I629 Nontraumatic intracranial hemorrhage, unspecified: Secondary | ICD-10-CM

## 2011-10-16 DIAGNOSIS — Z1231 Encounter for screening mammogram for malignant neoplasm of breast: Secondary | ICD-10-CM

## 2011-10-16 DIAGNOSIS — D6861 Antiphospholipid syndrome: Secondary | ICD-10-CM

## 2011-10-16 DIAGNOSIS — N189 Chronic kidney disease, unspecified: Secondary | ICD-10-CM

## 2011-10-16 LAB — COMPREHENSIVE METABOLIC PANEL
Albumin: 4.2 g/dL (ref 3.5–5.2)
BUN: 35 mg/dL — ABNORMAL HIGH (ref 6–23)
CO2: 28 mEq/L (ref 19–32)
Calcium: 9.3 mg/dL (ref 8.4–10.5)
Chloride: 106 mEq/L (ref 96–112)
Glucose, Bld: 112 mg/dL — ABNORMAL HIGH (ref 70–99)
Potassium: 4.2 mEq/L (ref 3.5–5.3)

## 2011-10-16 LAB — CBC WITH DIFFERENTIAL/PLATELET
Basophils Absolute: 0 10*3/uL (ref 0.0–0.1)
HCT: 33.2 % — ABNORMAL LOW (ref 34.8–46.6)
HGB: 11.1 g/dL — ABNORMAL LOW (ref 11.6–15.9)
LYMPH%: 20.1 % (ref 14.0–49.7)
MONO#: 0.3 10*3/uL (ref 0.1–0.9)
NEUT%: 70.4 % (ref 38.4–76.8)
Platelets: 93 10*3/uL — ABNORMAL LOW (ref 145–400)
WBC: 4.8 10*3/uL (ref 3.9–10.3)
lymph#: 1 10*3/uL (ref 0.9–3.3)

## 2011-10-16 LAB — HEPARIN ANTI-XA: Heparin LMW: 0.54 IU/mL

## 2011-10-16 NOTE — Telephone Encounter (Signed)
Pt left vm asking if Dr. Cyndie Chime had any experience with anyone ordering arixtra from [place] & did he think it was OK.  She also reported that her lab today would be a trough level b/c she took her dose of arixtra Sun am.  ] Message returned to pt that Dr. Cyndie Chime did not have any experience with this but was OK for her to try.

## 2011-10-16 NOTE — Telephone Encounter (Signed)
Pt notified that trough heparin level should be OK but let's try to get a peak level next time.  She is on arixtra 2.5 mg qod.

## 2011-11-16 ENCOUNTER — Encounter: Payer: Self-pay | Admitting: Cardiology

## 2011-11-16 ENCOUNTER — Ambulatory Visit (INDEPENDENT_AMBULATORY_CARE_PROVIDER_SITE_OTHER): Payer: Medicare Other | Admitting: Cardiology

## 2011-11-16 VITALS — BP 146/72 | HR 47 | Ht 63.0 in | Wt 149.0 lb

## 2011-11-16 DIAGNOSIS — I10 Essential (primary) hypertension: Secondary | ICD-10-CM | POA: Diagnosis not present

## 2011-11-16 DIAGNOSIS — Z9889 Other specified postprocedural states: Secondary | ICD-10-CM | POA: Diagnosis not present

## 2011-11-16 DIAGNOSIS — I498 Other specified cardiac arrhythmias: Secondary | ICD-10-CM

## 2011-11-16 DIAGNOSIS — I509 Heart failure, unspecified: Secondary | ICD-10-CM | POA: Diagnosis not present

## 2011-11-16 NOTE — Assessment & Plan Note (Signed)
Status post ablation. 

## 2011-11-16 NOTE — Assessment & Plan Note (Signed)
Continue SBE prophylaxis. Repeat echocardiogram in July.

## 2011-11-16 NOTE — Assessment & Plan Note (Signed)
History of diastolic congestive heart failure. No symptoms at present.

## 2011-11-16 NOTE — Assessment & Plan Note (Signed)
Continue present blood pressure medications. 

## 2011-11-16 NOTE — Progress Notes (Signed)
HPI: Pleasant female for fu of rheumatic heart disease, atrial tachycardia, diastolic congestive heart failure. The patient had mitral valvuloplasty at [facility] in the late 90s. In 2007 she had a mechanical mitral valve replacement at [facility]. 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 She's had a prior subdural hematoma that required evacuation. She also has a history of heparin-induced thrombocytopenia. Her last echocardiogram was performed in Oct 2011. This revealed an ejection fraction of 50-55%, a with normally functioning prosthetic mitral valve, moderate left atrial enlargement and mild right atrial enlargement. There was mild AI. S/P tricuspid valve repair. Transesophageal echocardiogram in July of 2012 showed an ejection fraction of 45-50%, status post mitral valve replacement with mild mitral regurgitation and mild left atrial enlargement. Note she is also followed by Dr. Johney Frame for SVT. She is now s/p ablation at [facility]; Also with h/o torsades with amiodarone. Since I last saw her in Nov 2012, the patient denies any dyspnea on exertion, orthopnea, PND, pedal edema, palpitations, syncope or chest pain. Patient does describe a recent viral syndrome and vertigo and she is slowly recovering.   Current Outpatient Prescriptions  Medication Sig Dispense Refill  . acetaminophen (TYLENOL) 500 MG tablet Take by mouth as needed.        . AMOXICILLIN PO as directed. ONLY WHEN VISITING DENTIST      . Calcium-Magnesium (CAL-MAG PO) Take by mouth daily.        . Cholecalciferol (VITAMIN D) 2000 UNITS tablet Take 2,000 Units by mouth daily.        . Estradiol Acetate (FEMRING) 0.05 MG/24HR RING as directed.       . fish oil-omega-3 fatty acids 1000 MG capsule Take 1 g by mouth daily.       . fondaparinux (ARIXTRA) 2.5 MG/0.5ML SOLN Inject 2.5 mg into the skin every other day.        .  levothyroxine (SYNTHROID, LEVOTHROID) 75 MCG tablet Take 75 mcg by mouth daily.        Marland Kitchen lisinopril (PRINIVIL,ZESTRIL) 5 MG tablet Take 5 mg by mouth 2 (two) times daily.       . Multiple Vitamins-Minerals (MULTIVITAMIN WITH MINERALS) tablet Take 1 tablet by mouth daily.        . NON FORMULARY Suprema dophilus UAD      . rosuvastatin (CRESTOR) 5 MG tablet Take 5 mg by mouth daily.        . valACYclovir (VALTREX) 1000 MG tablet Take 1,000 mg by mouth as needed.           Past Medical History  Diagnosis Date  . Rheumatic heart disease 1995    post percutaneous valvuloplasty at [facility] with subsequent mechanical mitral valve replacement and tricuspid annuloplasty at [facility]  . Cerebrovascular accident     prior histories  . Subdural hematoma     on coumadin  . Antiphospholipid antibody syndrome   . Heparin-induced thrombocytopenia   . HTN (hypertension)   . History of Hashimoto thyroiditis   . HLD (hyperlipidemia)   . Atrial flutter     atypical right atrial flutter s/p ablation at [facility] by Dr Smith Robert  . Other activity     Torsades with amiodarone    Past Surgical History  Procedure Date  . Mitral valve replacement 2007    with tricuspid annuloplasty  . Amputation     left atrial  appendage amputated with MVR  . Craniotomy 2008    for SDH  . Total abdominal hysterectomy   . Cholecystectomy   . Tonsillectomy   . Percutaneous balloon valvuloplasty 1995    At [facility] with subsequent mechanical mitral valve replacement and tricuspid annuloplasty at [facility]  . Atrial ablation surgery     CTI and R atriostomy scar flutter ablation at [facility] 8/12,  repeat  ablation at [facility]  9/12    History   Social History  . Marital Status: Married    Spouse Name: N/A    Number of Children: N/A  . Years of Education: N/A   Occupational History  . PHYSICIAN     retired   Social History Main Topics  . Smoking status: Never Smoker   . Smokeless tobacco: Never Used    . Alcohol Use: No  . Drug Use: No  . Sexually Active: Not on file   Other Topics Concern  . 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     ROS: recent viral syndrome and vertigo but no fevers or chills, productive cough, hemoptysis, dysphasia, odynophagia, melena, hematochezia, dysuria, hematuria, rash, seizure activity, orthopnea, PND, pedal edema, claudication. Remaining systems are negative.  Physical Exam: Well-developed well-nourished in no acute distress.  Skin is warm and dry. Ecchymosis noted HEENT is normal.  Neck is supple.  Chest is clear to auscultation with normal expansion.  Cardiovascular exam is regular rate and rhythm. Crisp mechanical heart valve, 1/6 systolic murmur Abdominal exam nontender or distended. No masses palpated. Extremities show trace edema. neuro grossly intact  ECG marked sinus bradycardia at a rate of 47. Probable limb lead reversal. IVCD. Cannot rule out prior septal infarct.

## 2011-11-16 NOTE — Patient Instructions (Signed)

## 2011-11-22 DIAGNOSIS — D649 Anemia, unspecified: Secondary | ICD-10-CM | POA: Diagnosis not present

## 2011-11-22 DIAGNOSIS — I10 Essential (primary) hypertension: Secondary | ICD-10-CM | POA: Diagnosis not present

## 2011-11-22 DIAGNOSIS — I129 Hypertensive chronic kidney disease with stage 1 through stage 4 chronic kidney disease, or unspecified chronic kidney disease: Secondary | ICD-10-CM | POA: Diagnosis not present

## 2011-12-08 ENCOUNTER — Other Ambulatory Visit: Payer: Self-pay | Admitting: *Deleted

## 2011-12-08 DIAGNOSIS — D6859 Other primary thrombophilia: Secondary | ICD-10-CM

## 2011-12-08 MED ORDER — FONDAPARINUX SODIUM 2.5 MG/0.5ML ~~LOC~~ SOLN: 2.5000 mg | SUBCUTANEOUS | Status: DC

## 2011-12-18 ENCOUNTER — Other Ambulatory Visit (HOSPITAL_BASED_OUTPATIENT_CLINIC_OR_DEPARTMENT_OTHER): Payer: Medicare Other | Admitting: Lab

## 2011-12-18 ENCOUNTER — Telehealth: Payer: Self-pay | Admitting: *Deleted

## 2011-12-18 DIAGNOSIS — D689 Coagulation defect, unspecified: Secondary | ICD-10-CM

## 2011-12-18 DIAGNOSIS — I629 Nontraumatic intracranial hemorrhage, unspecified: Secondary | ICD-10-CM

## 2011-12-18 DIAGNOSIS — D6861 Antiphospholipid syndrome: Secondary | ICD-10-CM

## 2011-12-18 DIAGNOSIS — N189 Chronic kidney disease, unspecified: Secondary | ICD-10-CM

## 2011-12-18 LAB — CBC WITH DIFFERENTIAL/PLATELET
Basophils Absolute: 0 10*3/uL (ref 0.0–0.1)
EOS%: 3.3 % (ref 0.0–7.0)
HCT: 30.8 % — ABNORMAL LOW (ref 34.8–46.6)
HGB: 10.2 g/dL — ABNORMAL LOW (ref 11.6–15.9)
LYMPH%: 17.6 % (ref 14.0–49.7)
MCH: 31.8 pg (ref 25.1–34.0)
MCV: 95.8 fL (ref 79.5–101.0)
MONO%: 5.4 % (ref 0.0–14.0)
NEUT%: 72.9 % (ref 38.4–76.8)
Platelets: 102 10*3/uL — ABNORMAL LOW (ref 145–400)

## 2011-12-18 LAB — COMPREHENSIVE METABOLIC PANEL
AST: 20 U/L (ref 0–37)
Alkaline Phosphatase: 56 U/L (ref 39–117)
BUN: 40 mg/dL — ABNORMAL HIGH (ref 6–23)
Creatinine, Ser: 1.58 mg/dL — ABNORMAL HIGH (ref 0.50–1.10)
Glucose, Bld: 100 mg/dL — ABNORMAL HIGH (ref 70–99)

## 2011-12-18 NOTE — Telephone Encounter (Signed)
Patient called requesting to give a message to Dr. Cyndie Chime.  "Having labs drawn at another office and he needs to know this will be a PEAK level."  Informed patient Dr. Cyndie Chime will return to office on 12-25-2011 and this message will be left for his review upon his return.

## 2011-12-19 ENCOUNTER — Telehealth: Payer: Self-pay | Admitting: *Deleted

## 2011-12-19 NOTE — Telephone Encounter (Signed)
Pt notified of lab results & to cont. same arixtra dose per Dr. Truett Perna.

## 2011-12-21 ENCOUNTER — Encounter: Payer: Self-pay | Admitting: Oncology

## 2011-12-21 NOTE — Progress Notes (Signed)
Received voice message from Ms. Lodato because she received a letter from [facility] stating they need Medicare control number and  billing information. Advised to bring a copy of the letter and I will forward to [facility] to follow-up. [name] will be in on 12/22/11.

## 2011-12-22 DIAGNOSIS — L821 Other seborrheic keratosis: Secondary | ICD-10-CM | POA: Diagnosis not present

## 2011-12-22 DIAGNOSIS — D1801 Hemangioma of skin and subcutaneous tissue: Secondary | ICD-10-CM | POA: Diagnosis not present

## 2011-12-22 DIAGNOSIS — D239 Other benign neoplasm of skin, unspecified: Secondary | ICD-10-CM | POA: Diagnosis not present

## 2012-01-01 ENCOUNTER — Ambulatory Visit (HOSPITAL_COMMUNITY): Payer: Medicare Other | Attending: Cardiology

## 2012-01-01 DIAGNOSIS — I099 Rheumatic heart disease, unspecified: Secondary | ICD-10-CM | POA: Insufficient documentation

## 2012-01-01 DIAGNOSIS — Z9889 Other specified postprocedural states: Secondary | ICD-10-CM

## 2012-01-01 DIAGNOSIS — I509 Heart failure, unspecified: Secondary | ICD-10-CM | POA: Diagnosis not present

## 2012-01-01 DIAGNOSIS — I517 Cardiomegaly: Secondary | ICD-10-CM | POA: Insufficient documentation

## 2012-01-01 DIAGNOSIS — I059 Rheumatic mitral valve disease, unspecified: Secondary | ICD-10-CM | POA: Diagnosis not present

## 2012-01-01 DIAGNOSIS — I4892 Unspecified atrial flutter: Secondary | ICD-10-CM | POA: Insufficient documentation

## 2012-01-01 DIAGNOSIS — I10 Essential (primary) hypertension: Secondary | ICD-10-CM | POA: Insufficient documentation

## 2012-01-01 DIAGNOSIS — I079 Rheumatic tricuspid valve disease, unspecified: Secondary | ICD-10-CM | POA: Diagnosis not present

## 2012-01-01 DIAGNOSIS — I498 Other specified cardiac arrhythmias: Secondary | ICD-10-CM | POA: Diagnosis not present

## 2012-01-01 DIAGNOSIS — Z8673 Personal history of transient ischemic attack (TIA), and cerebral infarction without residual deficits: Secondary | ICD-10-CM | POA: Insufficient documentation

## 2012-01-01 DIAGNOSIS — Z954 Presence of other heart-valve replacement: Secondary | ICD-10-CM | POA: Diagnosis not present

## 2012-01-01 NOTE — Progress Notes (Signed)
Echocardiogram performed.  

## 2012-01-15 DIAGNOSIS — G40209 Localization-related (focal) (partial) symptomatic epilepsy and epileptic syndromes with complex partial seizures, not intractable, without status epilepticus: Secondary | ICD-10-CM | POA: Diagnosis not present

## 2012-01-15 DIAGNOSIS — S065X9A Traumatic subdural hemorrhage with loss of consciousness of unspecified duration, initial encounter: Secondary | ICD-10-CM | POA: Diagnosis not present

## 2012-01-15 DIAGNOSIS — I6330 Cerebral infarction due to thrombosis of unspecified cerebral artery: Secondary | ICD-10-CM | POA: Diagnosis not present

## 2012-02-02 ENCOUNTER — Telehealth: Payer: Self-pay | Admitting: Oncology

## 2012-02-02 NOTE — Telephone Encounter (Signed)
Pt returned my call and requested that lb appt be moved back to 8/12 and that f/u be r/s to next available Monday due to she does not come into town on Tuesday. Per pt do not call her on cell just call home phone and lm. appts moved to 8/12 for lb and next available Monday which is 9/16 as pt requested - ok per JG. Called home phone and lmonvm for pt re new d/t's.

## 2012-02-02 NOTE — Telephone Encounter (Signed)
Per JG moved 8/12 appt to 8/13 @ 4 pm due to call. lmonvm for pt on both home/cell phones re change w/new d/t for 8/13 @ 3:30pm

## 2012-02-05 ENCOUNTER — Other Ambulatory Visit: Payer: PRIVATE HEALTH INSURANCE | Admitting: Lab

## 2012-02-05 ENCOUNTER — Ambulatory Visit: Payer: PRIVATE HEALTH INSURANCE | Admitting: Oncology

## 2012-02-05 ENCOUNTER — Other Ambulatory Visit (HOSPITAL_BASED_OUTPATIENT_CLINIC_OR_DEPARTMENT_OTHER): Payer: Medicare Other | Admitting: Lab

## 2012-02-05 DIAGNOSIS — N189 Chronic kidney disease, unspecified: Secondary | ICD-10-CM | POA: Diagnosis not present

## 2012-02-05 DIAGNOSIS — D6859 Other primary thrombophilia: Secondary | ICD-10-CM | POA: Diagnosis not present

## 2012-02-05 DIAGNOSIS — D689 Coagulation defect, unspecified: Secondary | ICD-10-CM

## 2012-02-05 DIAGNOSIS — D6861 Antiphospholipid syndrome: Secondary | ICD-10-CM

## 2012-02-05 DIAGNOSIS — I629 Nontraumatic intracranial hemorrhage, unspecified: Secondary | ICD-10-CM

## 2012-02-05 LAB — COMPREHENSIVE METABOLIC PANEL
Albumin: 4 g/dL (ref 3.5–5.2)
Alkaline Phosphatase: 61 U/L (ref 39–117)
BUN: 45 mg/dL — ABNORMAL HIGH (ref 6–23)
CO2: 25 mEq/L (ref 19–32)
Glucose, Bld: 120 mg/dL — ABNORMAL HIGH (ref 70–99)
Potassium: 5.5 mEq/L — ABNORMAL HIGH (ref 3.5–5.3)
Sodium: 141 mEq/L (ref 135–145)
Total Protein: 6.3 g/dL (ref 6.0–8.3)

## 2012-02-05 LAB — CBC WITH DIFFERENTIAL/PLATELET
Basophils Absolute: 0 10*3/uL (ref 0.0–0.1)
Eosinophils Absolute: 0.2 10*3/uL (ref 0.0–0.5)
HGB: 10 g/dL — ABNORMAL LOW (ref 11.6–15.9)
LYMPH%: 13.6 % — ABNORMAL LOW (ref 14.0–49.7)
MCV: 95.5 fL (ref 79.5–101.0)
MONO#: 0.3 10*3/uL (ref 0.1–0.9)
MONO%: 4.5 % (ref 0.0–14.0)
NEUT#: 4.4 10*3/uL (ref 1.5–6.5)
Platelets: 97 10*3/uL — ABNORMAL LOW (ref 145–400)
RDW: 13.1 % (ref 11.2–14.5)
WBC: 5.6 10*3/uL (ref 3.9–10.3)

## 2012-02-05 LAB — HEPARIN ANTI-XA: Heparin LMW: 0.56 IU/mL

## 2012-02-05 NOTE — Progress Notes (Signed)
This visit was rescheduled due to MD on call

## 2012-02-06 ENCOUNTER — Ambulatory Visit: Payer: PRIVATE HEALTH INSURANCE | Admitting: Oncology

## 2012-02-06 ENCOUNTER — Telehealth: Payer: Self-pay | Admitting: Oncology

## 2012-02-06 ENCOUNTER — Other Ambulatory Visit: Payer: Self-pay | Admitting: Gastroenterology

## 2012-02-06 ENCOUNTER — Other Ambulatory Visit: Payer: PRIVATE HEALTH INSURANCE | Admitting: Lab

## 2012-02-06 ENCOUNTER — Other Ambulatory Visit: Payer: Self-pay | Admitting: *Deleted

## 2012-02-06 ENCOUNTER — Telehealth: Payer: Self-pay | Admitting: *Deleted

## 2012-02-06 DIAGNOSIS — Z800 Family history of malignant neoplasm of digestive organs: Secondary | ICD-10-CM

## 2012-02-06 NOTE — Telephone Encounter (Signed)
Spoke with pt regarding lab appt 8/14 to re-check potassium level.  She stated "I have not been able to alter my diet yet" and she did not want to come in for labs.  Stated per Dr. Cyndie Chime, he wanted to get labs re-checked 8/14.  Pt stated "I will try my best to come at 11:30"

## 2012-02-06 NOTE — Telephone Encounter (Signed)
Called pt and left message regarding lab for tomorrow 02/07/12

## 2012-02-07 ENCOUNTER — Other Ambulatory Visit (HOSPITAL_BASED_OUTPATIENT_CLINIC_OR_DEPARTMENT_OTHER): Payer: Medicare Other | Admitting: Lab

## 2012-02-07 DIAGNOSIS — D7582 Heparin induced thrombocytopenia (HIT): Secondary | ICD-10-CM | POA: Diagnosis not present

## 2012-02-07 DIAGNOSIS — D689 Coagulation defect, unspecified: Secondary | ICD-10-CM

## 2012-02-07 DIAGNOSIS — E875 Hyperkalemia: Secondary | ICD-10-CM

## 2012-02-07 LAB — BASIC METABOLIC PANEL
Calcium: 9.4 mg/dL (ref 8.4–10.5)
Glucose, Bld: 79 mg/dL (ref 70–99)
Potassium: 5.4 mEq/L — ABNORMAL HIGH (ref 3.5–5.3)
Sodium: 137 mEq/L (ref 135–145)

## 2012-02-19 ENCOUNTER — Other Ambulatory Visit: Payer: PRIVATE HEALTH INSURANCE

## 2012-02-20 ENCOUNTER — Other Ambulatory Visit: Payer: Self-pay | Admitting: *Deleted

## 2012-02-20 DIAGNOSIS — D6859 Other primary thrombophilia: Secondary | ICD-10-CM

## 2012-02-20 MED ORDER — FONDAPARINUX SODIUM 2.5 MG/0.5ML ~~LOC~~ SOLN: 2.5000 mg | SUBCUTANEOUS | Status: DC

## 2012-02-22 DIAGNOSIS — I129 Hypertensive chronic kidney disease with stage 1 through stage 4 chronic kidney disease, or unspecified chronic kidney disease: Secondary | ICD-10-CM | POA: Diagnosis not present

## 2012-02-22 DIAGNOSIS — E875 Hyperkalemia: Secondary | ICD-10-CM | POA: Diagnosis not present

## 2012-03-05 ENCOUNTER — Telehealth: Payer: Self-pay | Admitting: Cardiology

## 2012-03-05 NOTE — Telephone Encounter (Signed)
Spoke to patient she wanted Dr.Crenshaw to know her pulse rate has been elevated for about 2 weeks.Stated normally pulse is 40 to 50.and now it is ranging in the 80's and that is not normal for her.States 2 weeks ago her lisinopril was changed to 2.5 mg twice a day because of her elevated potassium.States Dr.Granfortuna is managing her potassium and she is seeing him again on Monday 03/18/12.States she just went for a walk and her heart rate is faster than normal and slightly irregular. 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 Message sent to [facility] for advice.

## 2012-03-05 NOTE — Telephone Encounter (Signed)
Please return call to patient regarding elevated pulse, she can be reached at [phone].  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

## 2012-03-05 NOTE — Telephone Encounter (Signed)
Would check ECG at her convenience to document rhythm. [name]

## 2012-03-06 NOTE — Telephone Encounter (Signed)
Spoke with pt, Aware of dr Ludwig Clarks recommendations. She would like to come by the office on Friday. appt made.

## 2012-03-08 ENCOUNTER — Encounter: Payer: Self-pay | Admitting: Internal Medicine

## 2012-03-08 ENCOUNTER — Ambulatory Visit (INDEPENDENT_AMBULATORY_CARE_PROVIDER_SITE_OTHER): Payer: Medicare Other | Admitting: Internal Medicine

## 2012-03-08 ENCOUNTER — Encounter: Payer: Self-pay | Admitting: *Deleted

## 2012-03-08 VITALS — BP 128/82 | HR 63 | Wt 149.0 lb

## 2012-03-08 DIAGNOSIS — D6861 Antiphospholipid syndrome: Secondary | ICD-10-CM

## 2012-03-08 DIAGNOSIS — I5042 Chronic combined systolic (congestive) and diastolic (congestive) heart failure: Secondary | ICD-10-CM

## 2012-03-08 DIAGNOSIS — I4892 Unspecified atrial flutter: Secondary | ICD-10-CM

## 2012-03-08 DIAGNOSIS — D6859 Other primary thrombophilia: Secondary | ICD-10-CM

## 2012-03-08 DIAGNOSIS — I509 Heart failure, unspecified: Secondary | ICD-10-CM

## 2012-03-08 MED ORDER — FUROSEMIDE 20 MG PO TABS: ORAL_TABLET | ORAL | Status: DC

## 2012-03-08 NOTE — Patient Instructions (Addendum)
Your physician has recommended that you have a Cardioversion (DCCV). Electrical Cardioversion uses a jolt of electricity to your heart either through paddles or wired patches attached to your chest. This is a controlled, usually prescheduled, procedure. Defibrillation is done under light anesthesia in the hospital, and you usually go home the day of the procedure. This is done to get your heart back into a normal rhythm. You are not awake for the procedure. Please see the instruction sheet given to you today.  Your physician recommends that you continue on your current medications as directed. Please refer to the Current Medication list given to you today. 

## 2012-03-08 NOTE — Assessment & Plan Note (Signed)
She has recurrent atrial flutter with a prior history of atrial tachycardia and atypical atrial flutter ablation x2 at [facility] with a second report being quite disheartiening describing such as .significant t scar tissue in the atrium mapping could not be accomplished. I have discussed anticoagulation with Dr. Cyndie Chime; she is felt to be fully anticoagulated on her current regime. We will also send her for cardioversion which given the paucity of her symptoms she would like to delay until next Thursday. In the event that her symptoms worsen he can certainly be done sooner than that. We discussed alternatives to cardioversion. I've explained that  . infrequent cardioversion may be the best strategy available to [place]; given her difficulties with antiarrhythmic drugs, catheter ablation-#3 would be probably the only other alternative

## 2012-03-08 NOTE — Assessment & Plan Note (Signed)
curently euvolemic

## 2012-03-08 NOTE — Assessment & Plan Note (Signed)
As above.

## 2012-03-08 NOTE — Progress Notes (Signed)
. 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 She underwent mechanical valve replacement a week for the 2007 complicated by an embolic CVA.  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  Last echo was October 2011 straight ejection fraction 50-55%. She has a history of a tricuspid valve repair. TEE July 2012 confirmed ejection fraction.  Her arrhythmia history is notable her for torsade de pointes  with amiodarone;  she has had atypical atrial flutters in the past and underwent TEE guided cardioversion about a year ago.  She underwent catheter ablation at [facility] in august with recurrence and repeat procedure in Sept The nose describes her right atrial nap he has been too difficult because of scarring. Empiric ablation was undertaken for the SVC to the Crista in the presence of the tricuspid annulus. In the vicinity of the prior ablation.  She comes in today having exercised well this morning but having noted that her heart rate was fast about a week ago. Normal heart rate is in the 40s and she had noted in the 80s-90s.    Past Medical History  Diagnosis Date  . Rheumatic heart disease 1995    post percutaneous valvuloplasty at [facility] with subsequent mechanical mitral valve replacement and tricuspid annuloplasty at [facility]  . Cerebrovascular accident     prior histories  . Subdural hematoma     on coumadin  . Antiphospholipid antibody syndrome   . Heparin-induced thrombocytopenia   . HTN (hypertension)   . History of Hashimoto thyroiditis   . HLD (hyperlipidemia)   . Atrial flutter     atypical right atrial flutter s/p ablation at [facility] by Dr Smith Robert  . Other activity    Torsades with amiodarone    Past Surgical History  Procedure Date  . Mitral valve replacement 2007    with tricuspid annuloplasty  . Amputation     left atrial appendage amputated with MVR  . Craniotomy 2008    for SDH  . Total abdominal hysterectomy   . Cholecystectomy   . Tonsillectomy   . Percutaneous balloon valvuloplasty 1995    At [facility] with subsequent mechanical mitral valve replacement and tricuspid annuloplasty at [facility]  . Atrial ablation surgery     CTI and R atriostomy scar flutter ablation at [facility] 8/12,  repeat  ablation at [facility]  9/12    Current Outpatient Prescriptions  Medication Sig Dispense Refill  . acetaminophen (TYLENOL) 500 MG tablet Take by mouth as needed.        . AMOXICILLIN PO as directed. ONLY WHEN VISITING DENTIST      . Calcium-Magnesium (CAL-MAG PO) Take by mouth daily.        . Cholecalciferol (VITAMIN D) 2000 UNITS tablet Take 2,000 Units by mouth daily.        . Estradiol Acetate (FEMRING) 0.05 MG/24HR RING as directed.       . fish oil-omega-3 fatty acids 1000 MG capsule Take 1 g by mouth daily.       . fondaparinux (ARIXTRA) 2.5 MG/0.5ML SOLN Inject 0.5 mLs (2.5 mg total) into the skin every other day.  7.5 mL  1  . furosemide (LASIX) 20 MG tablet Take 10 mg by mouth  every other day.      . levothyroxine (SYNTHROID, LEVOTHROID) 75 MCG tablet Take 75 mcg by mouth daily.        Marland Kitchen lisinopril (PRINIVIL,ZESTRIL) 5 MG tablet Take 2.5 mg by mouth 2 (two) times daily.       . NON FORMULARY Suprema dophilus UAD      . rosuvastatin (CRESTOR) 5 MG tablet Take 5 mg by mouth daily.        . valACYclovir (VALTREX) 1000 MG tablet Take 1,000 mg by mouth as needed.          Allergies  Allergen Reactions  . Amiodarone Hcl   . Heparin   . Iodinated Diagnostic Agents   . Vitamin K     REACTION: Anaphylaxis --- IV only allergy    Review of Systems negative except from HPI and PMH  Physical Exam BP 128/82  Pulse 63  Wt 149 lb (67.586  kg) Well developed and well nourished in no acute distress HENT normal E scleral and icterus clear Neck Supple JVP 8-9 carotids brisk and full Clear to ausculation Rapid and Irregular rate and rhythm, mechanical S1 and a 2/6 systolic murmur   Soft with active bowel sounds No clubbing cyanosis  no Edema Alert and oriented, grossly normal motor and sensory function Skin Warm and Dry  The echocardiogram demonstrates atrial flutter with ventricular rate of 63. Flutter waves are negative in leads II, III, and F. it is a distinct morphology from prior tracings.  Assessment and  Plan

## 2012-03-11 ENCOUNTER — Ambulatory Visit (HOSPITAL_BASED_OUTPATIENT_CLINIC_OR_DEPARTMENT_OTHER): Payer: Medicare Other | Admitting: Oncology

## 2012-03-11 ENCOUNTER — Telehealth: Payer: Self-pay | Admitting: Oncology

## 2012-03-11 ENCOUNTER — Ambulatory Visit (HOSPITAL_BASED_OUTPATIENT_CLINIC_OR_DEPARTMENT_OTHER): Payer: Medicare Other | Admitting: Lab

## 2012-03-11 VITALS — BP 120/70 | HR 46 | Temp 97.1°F | Resp 20 | Ht 63.0 in | Wt 151.6 lb

## 2012-03-11 DIAGNOSIS — D6859 Other primary thrombophilia: Secondary | ICD-10-CM | POA: Diagnosis not present

## 2012-03-11 DIAGNOSIS — N189 Chronic kidney disease, unspecified: Secondary | ICD-10-CM

## 2012-03-11 DIAGNOSIS — I4892 Unspecified atrial flutter: Secondary | ICD-10-CM

## 2012-03-11 DIAGNOSIS — D6861 Antiphospholipid syndrome: Secondary | ICD-10-CM

## 2012-03-11 DIAGNOSIS — D696 Thrombocytopenia, unspecified: Secondary | ICD-10-CM

## 2012-03-11 LAB — CBC WITH DIFFERENTIAL/PLATELET
BASO%: 1.1 % (ref 0.0–2.0)
Basophils Absolute: 0.1 10*3/uL (ref 0.0–0.1)
Eosinophils Absolute: 0.1 10*3/uL (ref 0.0–0.5)
HCT: 30.9 % — ABNORMAL LOW (ref 34.8–46.6)
HGB: 10.4 g/dL — ABNORMAL LOW (ref 11.6–15.9)
LYMPH%: 24 % (ref 14.0–49.7)
MCHC: 33.7 g/dL (ref 31.5–36.0)
MONO#: 0.4 10*3/uL (ref 0.1–0.9)
NEUT#: 3 10*3/uL (ref 1.5–6.5)
NEUT%: 63.4 % (ref 38.4–76.8)
Platelets: 85 10*3/uL — ABNORMAL LOW (ref 145–400)
WBC: 4.7 10*3/uL (ref 3.9–10.3)
lymph#: 1.1 10*3/uL (ref 0.9–3.3)

## 2012-03-11 LAB — BASIC METABOLIC PANEL (CC13)
CO2: 24 mEq/L (ref 22–29)
Calcium: 9 mg/dL (ref 8.4–10.4)
Chloride: 100 mEq/L (ref 98–107)
Creatinine: 2.2 mg/dL — ABNORMAL HIGH (ref 0.6–1.1)
Glucose: 81 mg/dl (ref 70–99)

## 2012-03-11 NOTE — Telephone Encounter (Signed)
Gave pt appt for November 2013 lab only and see MD in January 2014 lab and MD

## 2012-03-12 NOTE — Addendum Note (Signed)
Addended by: Marrion Coy L on: 03/12/2012 11:05 AM   Modules accepted: Orders

## 2012-03-12 NOTE — Progress Notes (Signed)
Hematology and Oncology Follow Up Visit  [name] [id number] 03-23-1946 66 y.o. 03/12/2012 7:36 PM   Principle Diagnosis: Encounter Diagnoses  Name Primary?  . THROMBOCYTOPENIA, CHRONIC Yes  . CHRONIC KIDNEY DISEASE UNSPECIFIED   . 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 Overall she is doing well. She did recently have a recurrence of atrial fibrillation/flutter. She had a cardioversion about one year ago. She has had a previous tricuspid annuloplasty and a mechanical mitral valve replacement. 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 Due to chronic renal insufficiency and the fact that Arixtra is cleared renally, her steady state dose is only 2.5 mg every other day. We have been monitoring peak and trough levels within assay that is ideally set up for Lovenox. I have been keeping peak levels at 0.8-1.2 and trough levels at 0.5-0.6. Peak level today is 1.24. Serum creatinines have ranged between 1.5 and 1.8. Value today higher than recent baseline at 2.2 compared with 1.6 on August 14 and 1.7 on August 12. She has recently developed moderate hyperkalemia with potassium values up to 5.5. No hypercarbia. She continues to follow with Dr. Eliott Nine, nephrology.  She was not having palpitations but when she is in sinus rhythm she has relative bradycardia with heart rate in the 40s. She noted decreased exercise tolerance with increasing fatigue and then increase in her pulse rate. Followup with her cardiologist revealed that she was back in atrial flutter. She is now being followed by lobe our cardiology.  Medications: reviewed  Allergies:  Allergies  Allergen Reactions  . Amiodarone Hcl   .  Heparin   . Iodinated Diagnostic Agents   . Vitamin K     REACTION: Anaphylaxis --- IV only allergy    Review of Systems: Constitutional:   Increasing fatigue when she is out of normal heart rhythm Respiratory: No dyspnea Cardiovascular:  No chest pain Gastrointestinal: No change in bowel habit Genito-Urinary: Not questioned Musculoskeletal: No muscle or bone pain Neurologic: no headache or change in vision Skin: No rash or ecchymosis Remaining ROS negative.  Physical Exam: Blood pressure 120/70, pulse 46, temperature 97.1 F (36.2 C), temperature source Oral, resp. rate 20, height 5\' 3"  (1.6 m), weight 151 lb 9.6 oz (68.765 kg). Wt Readings from Last 3 Encounters:  03/11/12 151 lb 9.6 oz (68.765 kg)  03/08/12 149 lb (67.586 kg)  11/16/11 149 lb (67.586 kg)     General appearance: Well-nourished Caucasian woman HENNT: Pharynx no erythema or exudate Lymph nodes: No adenopathy Breasts: Not examined Lungs: Clear to auscultation resonant to percussion Heart: Regular rhythm today. Mechanical heart valve sounds. Abdomen: Soft nontender Extremities: No edema no calf tenderness Vascular: No cyanosis Neurologic: Mental status intact, cranial nerves intact, PERRLA, optic discs chronically blurred no change from baseline, motor strength is 5 over 5, reflexes 1+ symmetric, persistent clumsiness on rapid alternating movements, finger to finger left hand Skin: No rash or ecchymosis  Lab Results: Lab Results  Component Value Date   WBC 4.7 03/11/2012   HGB 10.4* 03/11/2012   HCT 30.9* 03/11/2012   MCV 95.0 03/11/2012   PLT 85* 03/11/2012     Chemistry      Component Value Date/Time   NA 135* 03/11/2012 1444   NA 137 02/07/2012 1130  K 4.7 03/11/2012 1444   K 5.4* 02/07/2012 1130   CL 100 03/11/2012 1444   CL 103 02/07/2012 1130   CO2 24 03/11/2012 1444   CO2 27 02/07/2012 1130   BUN 83.0* 03/11/2012 1444   BUN 35* 02/07/2012 1130   CREATININE 2.2* 03/11/2012 1444   CREATININE 1.61*  02/07/2012 1130      Component Value Date/Time   CALCIUM 9.0 03/11/2012 1444   CALCIUM 9.4 02/07/2012 1130   ALKPHOS 61 02/05/2012 1042   AST 24 02/05/2012 1042   ALT 18 02/05/2012 1042   BILITOT 0.5 02/05/2012 1042      Impression and Plan: 1. Antiphospholipid antibody syndrome, status post 2 arterial embolic events, amaurosis fugax, and a transient ischemic attack prior to mitral valve failure requiring emergency replacement. Perioperative major right hemispheric stroke with resulting left upper extremity paresis. She was on chronic Coumadin anticoagulation until she had recurrent subdural hemorrhage in the brain. She is now maintained on Arixtra 2.5 mg subcutaneous every other day with good peak levels and a good clinical results with no thrombotic events, and no bleeding events. Plan: Continue the same indefinitely.  2 Mitral valve disease. May be related to the antiphospholipid antibody syndrome. Acute valve failure requiring emergency replacement when she presented with congestive heart failure and went into DIC in October 2007.  3 Atrial fibrillation, recurrent, post cardioversion. Now status post radiofrequency ablation. Now back in atrial flutter and repeat cardioversion is planned. 5 Essential hypertension.  6 Hypothyroid on replacement.  7 History of heparin-induced thrombocytopenia without thrombosis following an elective hysterectomy in the past.  8 Chronic renal insufficiency, renal function currently worse which may correlate with going back into atrial fibrillation/flutter.  I discussed anticoagulation around upcoming planned cardioversion with Dr. Graciela Husbands. She has had very steady levels including a good peak level today. I would continue the Arixtra at the current dose and schedule and not hold the drug for the cardioversion.    CC:. Dr. Lyna Poser; Dr. Olga Millers; Dr. Sherryl Manges; Dr. Camille Bal; Dr. Fayrene Fearing love   Levert Feinstein, MD 9/17/20137:36 PM

## 2012-03-14 ENCOUNTER — Ambulatory Visit (HOSPITAL_COMMUNITY)
Admission: RE | Admit: 2012-03-14 | Discharge: 2012-03-14 | Disposition: A | Discharge status: Home / Self Care | Payer: Medicare Other | Source: Ambulatory Visit | Attending: Internal Medicine | Admitting: Internal Medicine

## 2012-03-14 ENCOUNTER — Other Ambulatory Visit: Payer: Self-pay

## 2012-03-14 ENCOUNTER — Encounter (HOSPITAL_COMMUNITY)
Admission: RE | Disposition: A | Discharge status: Home / Self Care | Payer: Self-pay | Source: Ambulatory Visit | Attending: Internal Medicine

## 2012-03-14 ENCOUNTER — Encounter (HOSPITAL_COMMUNITY): Payer: Self-pay | Admitting: *Deleted

## 2012-03-14 DIAGNOSIS — Z01810 Encounter for preprocedural cardiovascular examination: Secondary | ICD-10-CM | POA: Insufficient documentation

## 2012-03-14 SURGERY — Surgical Case

## 2012-03-14 MED ORDER — SODIUM CHLORIDE 0.9 % IV SOLN: INTRAVENOUS | Status: DC

## 2012-03-15 ENCOUNTER — Telehealth: Payer: Self-pay | Admitting: Cardiology

## 2012-03-15 NOTE — Telephone Encounter (Signed)
appt given for 9/23 at 11 am with Dr Jens Som.

## 2012-03-15 NOTE — Telephone Encounter (Signed)
Pt calling wanting to see dr Jens Som before ICD implant--called pt and LM for her to phone [place] back

## 2012-03-15 NOTE — Telephone Encounter (Signed)
F/u  Returning call back to nurse.  

## 2012-03-15 NOTE — Telephone Encounter (Signed)
New Problem:    Patient called in wanting to be scheduled before 03/27/12 to come in and speak with Dr. Jens Som about her concerns with having an ICD implant procedure.  Please call back.

## 2012-03-18 ENCOUNTER — Encounter: Payer: Self-pay | Admitting: Cardiology

## 2012-03-18 ENCOUNTER — Ambulatory Visit (INDEPENDENT_AMBULATORY_CARE_PROVIDER_SITE_OTHER): Payer: Medicare Other | Admitting: Cardiology

## 2012-03-18 ENCOUNTER — Encounter: Payer: Self-pay | Admitting: *Deleted

## 2012-03-18 ENCOUNTER — Other Ambulatory Visit: Payer: Self-pay | Admitting: *Deleted

## 2012-03-18 ENCOUNTER — Encounter (HOSPITAL_COMMUNITY): Payer: Self-pay | Admitting: Pharmacy Technician

## 2012-03-18 ENCOUNTER — Telehealth: Payer: Self-pay | Admitting: Cardiology

## 2012-03-18 VITALS — BP 160/84 | HR 45 | Wt 155.0 lb

## 2012-03-18 DIAGNOSIS — I4892 Unspecified atrial flutter: Secondary | ICD-10-CM | POA: Diagnosis not present

## 2012-03-18 DIAGNOSIS — I5042 Chronic combined systolic (congestive) and diastolic (congestive) heart failure: Secondary | ICD-10-CM

## 2012-03-18 DIAGNOSIS — R001 Bradycardia, unspecified: Secondary | ICD-10-CM

## 2012-03-18 DIAGNOSIS — D6861 Antiphospholipid syndrome: Secondary | ICD-10-CM

## 2012-03-18 DIAGNOSIS — I10 Essential (primary) hypertension: Secondary | ICD-10-CM | POA: Diagnosis not present

## 2012-03-18 DIAGNOSIS — I498 Other specified cardiac arrhythmias: Secondary | ICD-10-CM | POA: Diagnosis not present

## 2012-03-18 DIAGNOSIS — D6859 Other primary thrombophilia: Secondary | ICD-10-CM

## 2012-03-18 DIAGNOSIS — N189 Chronic kidney disease, unspecified: Secondary | ICD-10-CM

## 2012-03-18 DIAGNOSIS — Z9889 Other specified postprocedural states: Secondary | ICD-10-CM | POA: Diagnosis not present

## 2012-03-18 DIAGNOSIS — Z01812 Encounter for preprocedural laboratory examination: Secondary | ICD-10-CM

## 2012-03-18 NOTE — Assessment & Plan Note (Signed)
Followed by nephrology. 

## 2012-03-18 NOTE — Assessment & Plan Note (Signed)
Patient noted to have junctional rhythm with a heart rate of 38. She does have some dyspnea on exertion most likely from decreased cardiac output. She is scheduled for a pacemaker with Dr. Ladona Ridgel.

## 2012-03-18 NOTE — Assessment & Plan Note (Signed)
Patient with increased dyspnea most likely from bradycardia and decreased cardiac output. She states she has gained 5 pounds recently. She will take one dose of Lasix today. Note recent laboratory showed significant prerenal azotemia which is being followed by nephrology.

## 2012-03-18 NOTE — Assessment & Plan Note (Signed)
Continue anticoagulation managed (arixtra) by hematology oncology.

## 2012-03-18 NOTE — Telephone Encounter (Signed)
Number busy

## 2012-03-18 NOTE — Telephone Encounter (Signed)
Spoke to Dr.Depinto she stated she was returning Sherry's call about a pacemaker appointment.Message sent to [facility].

## 2012-03-18 NOTE — Patient Instructions (Addendum)
Your physician recommends that you schedule a follow-up appointment in: 3 months. Your physician recommends that you continue on your current medications as directed. Please refer to the Current Medication list given to you today. 

## 2012-03-18 NOTE — Assessment & Plan Note (Signed)
Plan continue anti-coagulation. If she has recurrent episodes in the future that are symptomatic and she may require repeat ablation.

## 2012-03-18 NOTE — Progress Notes (Signed)
 HPI: Pleasant female for fu of rheumatic heart disease, atrial tachycardia/flutter, diastolic congestive heart failure. The patient had mitral valvuloplasty at [facility] in the late 90s. In 2007 she had a mechanical mitral valve replacement at [facility]. 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 She's had a prior subdural hematoma that required evacuation. She also has a history of heparin-induced thrombocytopenia. Her last echocardiogram was performed in July of 2013. This revealed an ejection fraction of 55-65%, a with normally functioning prosthetic mitral valve, s/p TV repair, mild left atrial enlargement. There was mild AS and trace AI. She is followed by Dr Allred for SVT and is s/p ablation at [facility]; Also with h/o torsades with amiodarone. Seen last week by Dr. Klein for atrial flutter. Patient seen by Dr. Granfortuna and felt to be therapeutically anticoagulated. Cardioversion was to be arranged. However when she presented she was not in atrial flutter. Her electrocardiogram showed junctional rhythm with a heart rate of 38. She has been scheduled to have a pacemaker. She continues to have some dyspnea on exertion but no orthopnea, PND, pedal edema, chest pain or syncope.   Current Outpatient Prescriptions  Medication Sig Dispense Refill  . acetaminophen (TYLENOL) 500 MG tablet Take by mouth as needed.        . AMOXICILLIN PO as directed. ONLY WHEN VISITING DENTIST      . Calcium-Magnesium (CAL-MAG PO) Take by mouth daily.        . Cholecalciferol (VITAMIN D) 2000 UNITS tablet Take 2,000 Units by mouth daily.        . Estradiol Acetate (FEMRING) 0.05 MG/24HR RING as directed.       . fish oil-omega-3 fatty acids 1000 MG capsule Take 1 g by mouth daily.       . fondaparinux (ARIXTRA) 2.5 MG/0.5ML SOLN Inject 0.5 mLs (2.5 mg total) into the skin every other day.  7.5 mL  1  .  levothyroxine (SYNTHROID, LEVOTHROID) 75 MCG tablet Take 75 mcg by mouth daily.        . lisinopril (PRINIVIL,ZESTRIL) 5 MG tablet Take 2.5 mg by mouth 2 (two) times daily.       . NON FORMULARY Suprema dophilus UAD      . rosuvastatin (CRESTOR) 5 MG tablet Take 5 mg by mouth daily.        . valACYclovir (VALTREX) 1000 MG tablet Take 1,000 mg by mouth as needed.           Past Medical History  Diagnosis Date  . Rheumatic heart disease 1995    post percutaneous valvuloplasty at [facility] 1995 with subsequent mechanical mitral valve replacement and tricuspid annuloplasty at [facility]  . Cerebrovascular accident     prior histories  . Subdural hematoma     on coumadin  . Antiphospholipid antibody syndrome   . Heparin-induced thrombocytopenia   . HTN (hypertension)   . History of Hashimoto thyroiditis   . HLD (hyperlipidemia)   . Atrial flutter     atypical right atrial flutter s/p ablation at [facility] by Dr Mouncey  . Other activity     Torsades with amiodarone    Past Surgical History  Procedure Date  . Mitral valve replacement 2007    with tricuspid annuloplasty  . Amputation     left atrial appendage amputated with MVR  . Craniotomy 2008    for SDH  .   Total abdominal hysterectomy   . Cholecystectomy   . Tonsillectomy   . Percutaneous balloon valvuloplasty 1995    At [facility] with subsequent mechanical mitral valve replacement and tricuspid annuloplasty at [facility]  . Atrial ablation surgery     CTI and R atriostomy scar flutter ablation at [facility] 8/12,  repeat  ablation at [facility]  9/12    History   Social History  . Marital Status: Married    Spouse Name: N/A    Number of Children: N/A  . Years of Education: N/A   Occupational History  . PHYSICIAN     retired   Social History Main Topics  . Smoking status: Never Smoker   . Smokeless tobacco: Never Used  . Alcohol Use: No  . Drug Use: No  . Sexually Active: Not on file   Other Topics Concern  .  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     ROS: some fatigue but no fevers or chills, productive cough, hemoptysis, dysphasia, odynophagia, melena, hematochezia, dysuria, hematuria, rash, seizure activity, orthopnea, PND, pedal edema, claudication. Remaining systems are negative.  Physical Exam: Well-developed well-nourished in no acute distress.  Skin is warm and dry.  HEENT is normal.  Neck is supple.  Chest is clear to auscultation with normal expansion.  Cardiovascular exam is regular but bradycardic; 1/6 systolic murmur left sternal border Abdominal exam nontender or distended. No masses palpated. Extremities show no edema. neuro grossly intact       

## 2012-03-18 NOTE — Assessment & Plan Note (Signed)
Continue SBE prophylaxis. 

## 2012-03-18 NOTE — Assessment & Plan Note (Signed)
Blood pressure is mildly elevated but typically is controlled.

## 2012-03-18 NOTE — Telephone Encounter (Signed)
Patient returning nurse call she can be reached at [phone]

## 2012-03-19 ENCOUNTER — Other Ambulatory Visit (INDEPENDENT_AMBULATORY_CARE_PROVIDER_SITE_OTHER): Payer: Medicare Other

## 2012-03-19 ENCOUNTER — Telehealth: Payer: Self-pay | Admitting: *Deleted

## 2012-03-19 ENCOUNTER — Other Ambulatory Visit: Payer: Medicare Other

## 2012-03-19 DIAGNOSIS — I498 Other specified cardiac arrhythmias: Secondary | ICD-10-CM | POA: Diagnosis not present

## 2012-03-19 DIAGNOSIS — Z01812 Encounter for preprocedural laboratory examination: Secondary | ICD-10-CM | POA: Diagnosis not present

## 2012-03-19 DIAGNOSIS — R001 Bradycardia, unspecified: Secondary | ICD-10-CM

## 2012-03-19 NOTE — Telephone Encounter (Signed)
Labs routed to Dr. Camille Bal

## 2012-03-19 NOTE — Telephone Encounter (Signed)
[name] spoke with her.

## 2012-03-20 ENCOUNTER — Telehealth: Payer: Self-pay | Admitting: Cardiology

## 2012-03-20 ENCOUNTER — Other Ambulatory Visit: Payer: Medicare Other

## 2012-03-20 LAB — BASIC METABOLIC PANEL
CO2: 25 mEq/L (ref 19–32)
Calcium: 9.4 mg/dL (ref 8.4–10.5)
GFR: 29.77 mL/min — ABNORMAL LOW (ref >60.00)
Glucose, Bld: 88 mg/dL (ref 70–99)
Potassium: 4.8 mEq/L (ref 3.5–5.1)
Sodium: 141 mEq/L (ref 135–145)

## 2012-03-20 LAB — CBC WITH DIFFERENTIAL/PLATELET
Basophils Absolute: 0 10*3/uL (ref 0.0–0.1)
Eosinophils Relative: 3.9 % (ref 0.0–5.0)
Monocytes Relative: 5.9 % (ref 3.0–12.0)
Neutrophils Relative %: 63.6 % (ref 43.0–77.0)
Platelets: 82 10*3/uL — ABNORMAL LOW (ref 150.0–400.0)
RDW: 12.4 % (ref 11.5–14.6)
WBC: 4.6 10*3/uL (ref 4.5–10.5)

## 2012-03-20 NOTE — Telephone Encounter (Signed)
Error

## 2012-03-27 ENCOUNTER — Telehealth: Payer: Self-pay | Admitting: *Deleted

## 2012-03-27 DIAGNOSIS — Z8673 Personal history of transient ischemic attack (TIA), and cerebral infarction without residual deficits: Secondary | ICD-10-CM | POA: Diagnosis not present

## 2012-03-27 DIAGNOSIS — I5022 Chronic systolic (congestive) heart failure: Secondary | ICD-10-CM | POA: Diagnosis not present

## 2012-03-27 DIAGNOSIS — D6859 Other primary thrombophilia: Secondary | ICD-10-CM | POA: Diagnosis not present

## 2012-03-27 DIAGNOSIS — E785 Hyperlipidemia, unspecified: Secondary | ICD-10-CM | POA: Diagnosis not present

## 2012-03-27 DIAGNOSIS — I447 Left bundle-branch block, unspecified: Secondary | ICD-10-CM | POA: Diagnosis not present

## 2012-03-27 DIAGNOSIS — I4892 Unspecified atrial flutter: Secondary | ICD-10-CM | POA: Diagnosis not present

## 2012-03-27 DIAGNOSIS — I10 Essential (primary) hypertension: Secondary | ICD-10-CM | POA: Diagnosis not present

## 2012-03-27 DIAGNOSIS — I509 Heart failure, unspecified: Secondary | ICD-10-CM | POA: Diagnosis not present

## 2012-03-27 DIAGNOSIS — I099 Rheumatic heart disease, unspecified: Secondary | ICD-10-CM | POA: Diagnosis not present

## 2012-03-27 DIAGNOSIS — Z23 Encounter for immunization: Secondary | ICD-10-CM | POA: Diagnosis not present

## 2012-03-27 DIAGNOSIS — Z7901 Long term (current) use of anticoagulants: Secondary | ICD-10-CM | POA: Diagnosis not present

## 2012-03-27 MED ORDER — SODIUM CHLORIDE 0.9 % IV SOLN: 250.0000 mL | INTRAVENOUS | Status: DC

## 2012-03-27 MED ORDER — SODIUM CHLORIDE 0.9 % IR SOLN
80.0000 mg | Status: DC
  Filled 2012-03-27: qty 2

## 2012-03-27 MED ORDER — CEFAZOLIN SODIUM-DEXTROSE 2-3 GM-% IV SOLR
2.0000 g | INTRAVENOUS | Status: DC
  Filled 2012-03-27 (×2): qty 50

## 2012-03-27 MED ORDER — SODIUM CHLORIDE 0.9 % IJ SOLN: 3.0000 mL | Freq: Two times a day (BID) | INTRAMUSCULAR | Status: DC

## 2012-03-27 MED ORDER — SODIUM CHLORIDE 0.45 % IV SOLN
INTRAVENOUS | Status: DC
  Administered 2012-03-28: 1000 mL via INTRAVENOUS

## 2012-03-27 MED ORDER — SODIUM CHLORIDE 0.9 % IJ SOLN: 3.0000 mL | INTRAMUSCULAR | Status: DC | PRN

## 2012-03-27 NOTE — Telephone Encounter (Signed)
Pt with dye allergy.  Per Dr Ladona Ridgel, pt to take Prednisone 60mg  and 150mg  of Ranitidine tonight.  We will pre-medicate with Solu-Medrol, Pepcid, and Benadryl tomorrow on arrival to short stay as well.

## 2012-03-28 ENCOUNTER — Ambulatory Visit (HOSPITAL_COMMUNITY)
Admission: RE | Admit: 2012-03-28 | Discharge: 2012-03-29 | Disposition: A | Discharge status: Home / Self Care | Payer: Medicare Other | Source: Ambulatory Visit | Attending: Internal Medicine | Admitting: Internal Medicine

## 2012-03-28 ENCOUNTER — Encounter (HOSPITAL_COMMUNITY): Payer: Self-pay | Admitting: General Practice

## 2012-03-28 ENCOUNTER — Encounter (HOSPITAL_COMMUNITY)
Admission: RE | Disposition: A | Discharge status: Home / Self Care | Payer: Self-pay | Source: Ambulatory Visit | Attending: Internal Medicine

## 2012-03-28 DIAGNOSIS — Z9889 Other specified postprocedural states: Secondary | ICD-10-CM

## 2012-03-28 DIAGNOSIS — I472 Ventricular tachycardia: Secondary | ICD-10-CM

## 2012-03-28 DIAGNOSIS — D6859 Other primary thrombophilia: Secondary | ICD-10-CM | POA: Insufficient documentation

## 2012-03-28 DIAGNOSIS — E785 Hyperlipidemia, unspecified: Secondary | ICD-10-CM | POA: Insufficient documentation

## 2012-03-28 DIAGNOSIS — I509 Heart failure, unspecified: Secondary | ICD-10-CM | POA: Insufficient documentation

## 2012-03-28 DIAGNOSIS — I447 Left bundle-branch block, unspecified: Secondary | ICD-10-CM | POA: Insufficient documentation

## 2012-03-28 DIAGNOSIS — I5022 Chronic systolic (congestive) heart failure: Secondary | ICD-10-CM

## 2012-03-28 DIAGNOSIS — Z7901 Long term (current) use of anticoagulants: Secondary | ICD-10-CM | POA: Insufficient documentation

## 2012-03-28 DIAGNOSIS — I10 Essential (primary) hypertension: Secondary | ICD-10-CM | POA: Insufficient documentation

## 2012-03-28 DIAGNOSIS — R0602 Shortness of breath: Secondary | ICD-10-CM

## 2012-03-28 DIAGNOSIS — Z23 Encounter for immunization: Secondary | ICD-10-CM | POA: Insufficient documentation

## 2012-03-28 DIAGNOSIS — I4892 Unspecified atrial flutter: Secondary | ICD-10-CM | POA: Insufficient documentation

## 2012-03-28 DIAGNOSIS — Z950 Presence of cardiac pacemaker: Secondary | ICD-10-CM

## 2012-03-28 DIAGNOSIS — I4460 Unspecified fascicular block: Secondary | ICD-10-CM

## 2012-03-28 DIAGNOSIS — R42 Dizziness and giddiness: Secondary | ICD-10-CM

## 2012-03-28 DIAGNOSIS — Z8673 Personal history of transient ischemic attack (TIA), and cerebral infarction without residual deficits: Secondary | ICD-10-CM | POA: Insufficient documentation

## 2012-03-28 DIAGNOSIS — I5042 Chronic combined systolic (congestive) and diastolic (congestive) heart failure: Secondary | ICD-10-CM

## 2012-03-28 DIAGNOSIS — N189 Chronic kidney disease, unspecified: Secondary | ICD-10-CM

## 2012-03-28 DIAGNOSIS — D696 Thrombocytopenia, unspecified: Secondary | ICD-10-CM

## 2012-03-28 DIAGNOSIS — R001 Bradycardia, unspecified: Secondary | ICD-10-CM

## 2012-03-28 DIAGNOSIS — D6861 Antiphospholipid syndrome: Secondary | ICD-10-CM

## 2012-03-28 DIAGNOSIS — I099 Rheumatic heart disease, unspecified: Secondary | ICD-10-CM | POA: Insufficient documentation

## 2012-03-28 HISTORY — DX: Hypothyroidism, unspecified: E03.9

## 2012-03-28 HISTORY — DX: Other symptoms and signs involving the nervous system: R29.818

## 2012-03-28 HISTORY — PX: BI-VENTRICULAR PACEMAKER INSERTION: SHX5462

## 2012-03-28 HISTORY — PX: OTHER SURGICAL HISTORY: SHX169

## 2012-03-28 HISTORY — DX: Unspecified osteoarthritis, unspecified site: M19.90

## 2012-03-28 HISTORY — DX: Other symptoms and signs involving the nervous system: R29.898

## 2012-03-28 HISTORY — DX: Personal history of other medical treatment: Z92.89

## 2012-03-28 HISTORY — DX: Presence of cardiac pacemaker: Z95.0

## 2012-03-28 HISTORY — DX: Anemia, unspecified: D64.9

## 2012-03-28 HISTORY — DX: Cardiac murmur, unspecified: R01.1

## 2012-03-28 HISTORY — DX: Heart failure, unspecified: I50.9

## 2012-03-28 HISTORY — DX: Chronic kidney disease, unspecified: N18.9

## 2012-03-28 LAB — SURGICAL PCR SCREEN: Staphylococcus aureus: POSITIVE — AB

## 2012-03-28 SURGERY — BI-VENTRICULAR PACEMAKER INSERTION (CRT-P)
Anesthesia: LOCAL

## 2012-03-28 MED ORDER — FAMOTIDINE IN NACL 20-0.9 MG/50ML-% IV SOLN
20.0000 mg | Freq: Once | INTRAVENOUS | Status: AC
  Administered 2012-03-28: 20 mg via INTRAVENOUS
  Filled 2012-03-28: qty 50

## 2012-03-28 MED ORDER — LISINOPRIL 2.5 MG PO TABS
2.5000 mg | ORAL_TABLET | Freq: Two times a day (BID) | ORAL | Status: DC
  Administered 2012-03-28 – 2012-03-29 (×2): 2.5 mg via ORAL
  Filled 2012-03-28 (×3): qty 1

## 2012-03-28 MED ORDER — MAGNESIUM HYDROXIDE 400 MG/5ML PO SUSP: 30.0000 mL | Freq: Every day | ORAL | Status: DC | PRN

## 2012-03-28 MED ORDER — ALUM & MAG HYDROXIDE-SIMETH 200-200-20 MG/5ML PO SUSP: 30.0000 mL | ORAL | Status: DC | PRN

## 2012-03-28 MED ORDER — LOPERAMIDE HCL 2 MG PO CAPS: 2.0000 mg | ORAL_CAPSULE | ORAL | Status: DC | PRN

## 2012-03-28 MED ORDER — PRAMOXINE-ZINC OXIDE IN MO 1-12.5 % RE OINT: 1.0000 "application " | TOPICAL_OINTMENT | Freq: Three times a day (TID) | RECTAL | Status: DC | PRN

## 2012-03-28 MED ORDER — HYDROCORTISONE 1 % EX CREA: 1.0000 "application " | TOPICAL_CREAM | Freq: Three times a day (TID) | CUTANEOUS | Status: DC | PRN

## 2012-03-28 MED ORDER — FONDAPARINUX SODIUM 2.5 MG/0.5ML ~~LOC~~ SOLN: 2.5000 mg | SUBCUTANEOUS | Status: DC

## 2012-03-28 MED ORDER — YOU HAVE A PACEMAKER BOOK
Freq: Once | Status: AC
  Administered 2012-03-28: 22:00:00
  Filled 2012-03-28: qty 1

## 2012-03-28 MED ORDER — ZOLPIDEM TARTRATE 5 MG PO TABS
5.0000 mg | ORAL_TABLET | Freq: Every evening | ORAL | Status: DC | PRN
  Administered 2012-03-29: 5 mg via ORAL
  Filled 2012-03-28: qty 1

## 2012-03-28 MED ORDER — SODIUM CHLORIDE 0.9 % IR SOLN
Status: DC
  Filled 2012-03-28: qty 2

## 2012-03-28 MED ORDER — ONDANSETRON HCL 4 MG/2ML IJ SOLN: 4.0000 mg | Freq: Four times a day (QID) | INTRAMUSCULAR | Status: DC | PRN

## 2012-03-28 MED ORDER — GUAIFENESIN-DM 100-10 MG/5ML PO SYRP: 15.0000 mL | ORAL_SOLUTION | ORAL | Status: DC | PRN

## 2012-03-28 MED ORDER — MIDAZOLAM HCL 5 MG/5ML IJ SOLN
INTRAMUSCULAR | Status: AC
  Filled 2012-03-28: qty 5

## 2012-03-28 MED ORDER — INFLUENZA VIRUS VACC SPLIT PF IM SUSP
0.5000 mL | INTRAMUSCULAR | Status: AC
  Administered 2012-03-29: 10:00:00 0.5 mL via INTRAMUSCULAR
  Filled 2012-03-28: qty 0.5

## 2012-03-28 MED ORDER — ACETAMINOPHEN 325 MG PO TABS
325.0000 mg | ORAL_TABLET | ORAL | Status: DC | PRN
  Administered 2012-03-28: 650 mg via ORAL
  Filled 2012-03-28: qty 2

## 2012-03-28 MED ORDER — LIDOCAINE HCL (PF) 1 % IJ SOLN
INTRAMUSCULAR | Status: AC
  Filled 2012-03-28: qty 60

## 2012-03-28 MED ORDER — METHYLPREDNISOLONE SODIUM SUCC 125 MG IJ SOLR
60.0000 mg | Freq: Once | INTRAMUSCULAR | Status: AC
  Administered 2012-03-28: 60 mg via INTRAVENOUS
  Filled 2012-03-28: qty 2

## 2012-03-28 MED ORDER — LEVOTHYROXINE SODIUM 75 MCG PO TABS
75.0000 ug | ORAL_TABLET | Freq: Every day | ORAL | Status: DC
  Administered 2012-03-29: 11:00:00 75 ug via ORAL
  Filled 2012-03-28 (×2): qty 1

## 2012-03-28 MED ORDER — CEFAZOLIN SODIUM-DEXTROSE 2-3 GM-% IV SOLR
2.0000 g | Freq: Four times a day (QID) | INTRAVENOUS | Status: AC
  Administered 2012-03-28 – 2012-03-29 (×3): 2 g via INTRAVENOUS
  Filled 2012-03-28 (×3): qty 50

## 2012-03-28 MED ORDER — DIPHENHYDRAMINE HCL 25 MG PO CAPS
25.0000 mg | ORAL_CAPSULE | Freq: Once | ORAL | Status: AC
  Administered 2012-03-28: 25 mg via ORAL
  Filled 2012-03-28 (×2): qty 1

## 2012-03-28 MED ORDER — FENTANYL CITRATE 0.05 MG/ML IJ SOLN
INTRAMUSCULAR | Status: AC
  Filled 2012-03-28: qty 2

## 2012-03-28 MED ORDER — MUPIROCIN 2 % EX OINT
TOPICAL_OINTMENT | Freq: Once | CUTANEOUS | Status: DC
  Filled 2012-03-28 (×3): qty 22

## 2012-03-28 NOTE — H&P (View-Only) (Signed)
HPI: Pleasant female for fu of rheumatic heart disease, atrial tachycardia/flutter, diastolic congestive heart failure. The patient had mitral valvuloplasty at [facility] in the late 90s. In 2007 she had a mechanical mitral valve replacement at [facility]. 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 She's had a prior subdural hematoma that required evacuation. She also has a history of heparin-induced thrombocytopenia. Her last echocardiogram was performed in July of 2013. This revealed an ejection fraction of 55-65%, a with normally functioning prosthetic mitral valve, s/p TV repair, mild left atrial enlargement. There was mild AS and trace AI. She is followed by Dr Johney Frame for SVT and is s/p ablation at [facility]; Also with h/o torsades with amiodarone. Seen last week by Dr. Graciela Husbands for atrial flutter. Patient seen by Dr. Cyndie Chime and felt to be therapeutically anticoagulated. Cardioversion was to be arranged. However when she presented she was not in atrial flutter. Her electrocardiogram showed junctional rhythm with a heart rate of 38. She has been scheduled to have a pacemaker. She continues to have some dyspnea on exertion but no orthopnea, PND, pedal edema, chest pain or syncope.   Current Outpatient Prescriptions  Medication Sig Dispense Refill  . acetaminophen (TYLENOL) 500 MG tablet Take by mouth as needed.        . AMOXICILLIN PO as directed. ONLY WHEN VISITING DENTIST      . Calcium-Magnesium (CAL-MAG PO) Take by mouth daily.        . Cholecalciferol (VITAMIN D) 2000 UNITS tablet Take 2,000 Units by mouth daily.        . Estradiol Acetate (FEMRING) 0.05 MG/24HR RING as directed.       . fish oil-omega-3 fatty acids 1000 MG capsule Take 1 g by mouth daily.       . fondaparinux (ARIXTRA) 2.5 MG/0.5ML SOLN Inject 0.5 mLs (2.5 mg total) into the skin every other day.  7.5 mL  1  .  levothyroxine (SYNTHROID, LEVOTHROID) 75 MCG tablet Take 75 mcg by mouth daily.        Marland Kitchen lisinopril (PRINIVIL,ZESTRIL) 5 MG tablet Take 2.5 mg by mouth 2 (two) times daily.       . NON FORMULARY Suprema dophilus UAD      . rosuvastatin (CRESTOR) 5 MG tablet Take 5 mg by mouth daily.        . valACYclovir (VALTREX) 1000 MG tablet Take 1,000 mg by mouth as needed.           Past Medical History  Diagnosis Date  . Rheumatic heart disease 1995    post percutaneous valvuloplasty at [facility] with subsequent mechanical mitral valve replacement and tricuspid annuloplasty at [facility]  . Cerebrovascular accident     prior histories  . Subdural hematoma     on coumadin  . Antiphospholipid antibody syndrome   . Heparin-induced thrombocytopenia   . HTN (hypertension)   . History of Hashimoto thyroiditis   . HLD (hyperlipidemia)   . Atrial flutter     atypical right atrial flutter s/p ablation at [facility] by Dr Smith Robert  . Other activity     Torsades with amiodarone    Past Surgical History  Procedure Date  . Mitral valve replacement 2007    with tricuspid annuloplasty  . Amputation     left atrial appendage amputated with MVR  . Craniotomy 2008    for SDH  .  Total abdominal hysterectomy   . Cholecystectomy   . Tonsillectomy   . Percutaneous balloon valvuloplasty 1995    At [facility] with subsequent mechanical mitral valve replacement and tricuspid annuloplasty at [facility]  . Atrial ablation surgery     CTI and R atriostomy scar flutter ablation at [facility] 8/12,  repeat  ablation at [facility]  9/12    History   Social History  . Marital Status: Married    Spouse Name: N/A    Number of Children: N/A  . Years of Education: N/A   Occupational History  . PHYSICIAN     retired   Social History Main Topics  . Smoking status: Never Smoker   . Smokeless tobacco: Never Used  . Alcohol Use: No  . Drug Use: No  . Sexually Active: Not on file   Other Topics Concern  .  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     ROS: some fatigue but no fevers or chills, productive cough, hemoptysis, dysphasia, odynophagia, melena, hematochezia, dysuria, hematuria, rash, seizure activity, orthopnea, PND, pedal edema, claudication. Remaining systems are negative.  Physical Exam: Well-developed well-nourished in no acute distress.  Skin is warm and dry.  HEENT is normal.  Neck is supple.  Chest is clear to auscultation with normal expansion.  Cardiovascular exam is regular but bradycardic; 1/6 systolic murmur left sternal border Abdominal exam nontender or distended. No masses palpated. Extremities show no edema. neuro grossly intact

## 2012-03-28 NOTE — Op Note (Signed)
BiV PPM insertion via the left subclavian vein without immediate complication. [id number].

## 2012-03-28 NOTE — Interval H&P Note (Signed)
History and Physical Interval Note: Patient seen and examined. I have discussed the risks/benefits/goals/expectations of BiV PPM insertion with the patient and she wishes to proceed.  03/28/2012 12:45 PM  [name]  has presented today for surgery, with the diagnosis of bradicardia  The various methods of treatment have been discussed with the patient and family. After consideration of risks, benefits and other options for treatment, the patient has consented to  Procedure(s) (LRB) with comments: BI-VENTRICULAR PACEMAKER INSERTION (CRT-P) (N/A) as a surgical intervention .  The patient's history has been reviewed, patient examined, no change in status, stable for surgery.  I have reviewed the patient's chart and labs.  Questions were answered to the patient's satisfaction.     Lewayne Bunting

## 2012-03-29 ENCOUNTER — Ambulatory Visit (HOSPITAL_COMMUNITY): Payer: Medicare Other

## 2012-03-29 ENCOUNTER — Telehealth: Payer: Self-pay | Admitting: *Deleted

## 2012-03-29 ENCOUNTER — Encounter: Payer: Self-pay | Admitting: *Deleted

## 2012-03-29 DIAGNOSIS — Z950 Presence of cardiac pacemaker: Secondary | ICD-10-CM

## 2012-03-29 DIAGNOSIS — I498 Other specified cardiac arrhythmias: Secondary | ICD-10-CM

## 2012-03-29 MED ORDER — FONDAPARINUX SODIUM 2.5 MG/0.5ML ~~LOC~~ SOLN: 2.5000 mg | SUBCUTANEOUS | Status: DC

## 2012-03-29 NOTE — Telephone Encounter (Signed)
Patient called.  Had pacemaker placed yesterday.  Last dose of arixtra was Wednesday.  MD would not order arixtra for today but wants her to wait a day.  She is much more concerned about a stroke than bleeding in her chest.  Would like Dr Cyndie Chime to help with this.  Will review with Dr. Truett Perna in Dr. Patsy Lager absence.  Please call patient back at [phone] or home [phone].  She should arrive at home around 1:30pm. Called Dr. Cyndie Chime:  Go ahead with arixtra dose today.   Called patient on cell.  She so appreciated my checking with MD.  She has plenty of arixtra and will go ahead with today's dose.

## 2012-03-29 NOTE — Progress Notes (Signed)
SUBJECTIVE: She feels well this am. No chest pain or SOB. Left shoulder is sore.   BP 137/85  Pulse 69  Temp 98.1 F (36.7 C) (Oral)  Resp 21  Ht 5\' 3"  (1.6 m)  Wt 150 lb (68.04 kg)  BMI 26.57 kg/m2  SpO2 98%  Intake/Output Summary (Last 24 hours) at 03/29/12 0843 Last data filed at 03/28/12 1700  Gross per 24 hour  Intake    240 ml  Output      0 ml  Net    240 ml    PHYSICAL EXAM General: Well developed, well nourished, in no acute distress. Alert and oriented x 3.  Psych:  Good affect, responds appropriately Neck: No JVD. No masses noted.  Lungs: Clear bilaterally with no wheezes or rhonci noted.  Heart: RRR with no murmurs noted. Abdomen: Bowel sounds are present. Soft, non-tender.  Extremities: No lower extremity edema.   LABS: No AM labs have been ordered.  Current Meds:    .  ceFAZolin (ANCEF) IV  2 g Intravenous Q6H  . diphenhydrAMINE  25 mg Oral Once  . famotidine (PEPCID) IV  20 mg Intravenous Once  . fentaNYL      . influenza  inactive virus vaccine  0.5 mL Intramuscular Tomorrow-1000  . levothyroxine  75 mcg Oral Daily  . lidocaine      . lisinopril  2.5 mg Oral BID  . methylPREDNISolone (SOLU-MEDROL) injection  60 mg Intravenous Once  . midazolam      . midazolam      . mupirocin ointment   Nasal Once  . you have a pacemaker book   Does not apply Once  . DISCONTD:  ceFAZolin (ANCEF) IV  2 g Intravenous On Call  . DISCONTD: fondaparinux  2.5 mg Subcutaneous QODAY  . DISCONTD: gentamicin irrigation  80 mg Irrigation On Call  . DISCONTD: gentamicin irrigation   Irrigation To Cath  . DISCONTD: sodium chloride  3 mL Intravenous Q12H   CXR: No pneumothorax, pacemaker leads in place  ASSESSMENT AND PLAN: History of rheumatic heart disease, atrial tachycardia/flutter, diastolic congestive heart failure. The patient had mitral valvuloplasty at [facility] in the late 90s. 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 She's had a prior subdural hematoma that required evacuation. She also has a history of heparin-induced thrombocytopenia. Her last echocardiogram was performed in July of 2013. This revealed an ejection fraction of 55-65%, a with normally functioning prosthetic mitral valve, s/p TV repair, mild left atrial enlargement. There was mild AS and trace AI. She is followed by Dr Johney Frame for SVT and is s/p ablation at [facility]; Also with h/o torsades with amiodarone. Seen recently by Dr. Graciela Husbands for atrial flutter. Patient seen by Dr. Cyndie Chime and felt to be therapeutically anticoagulated. Cardioversion was to be arranged. However when she presented she was not in atrial flutter. Her electrocardiogram showed junctional rhythm with a heart rate of 38. She was admitted yesterday after placement of biventricular pacemaker per Dr. Ladona Ridgel.  1. S/p pacemaker insertion as above. CXR ok this am. Device interrogation is ok. Will d/c home today. Follow up in EP clinic per protocol post device. She wishes to f/u with Dr. Johney Frame for EP issues and Dr. Jens Som for other cardiology issues. 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 She uses this every other  day.        [name]  10/4/20138:43 AM

## 2012-03-29 NOTE — Discharge Summary (Signed)
CARDIOLOGY DISCHARGE SUMMARY    Patient ID: [name],  MRN: [id number], DOB/AGE: 08/01/1945 66 y.o.  Admit date: 03/28/2012 Discharge date: 03/29/2012  Primary Care Physician: Marden Noble, MD Primary Cardiologist: Jens Som, MD  Primary Discharge Diagnosis:  1. Chronic systolic heart failure, EF of 40-98%, NYHA class II,  2. LBBB with QRS duration of 140 msec 3. Sinus bradycardia s/p BiV PPM implantation  Secondary Discharge Diagnoses:  1. Atrial flutter s/p right atrial flutter ablation 2. Atrial fibrillation 3. 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 Prior CVA 5. Antiphospholipid antibody syndrome 6. History of HIT 7. 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 History of Hashimoto's thyroiditis 9. History of SDH on warfarin s/p craniotomy and evacuation  Procedures This Admission:  1. BiV PPM implantation 03/28/2012 St. Jude model 2088T 52-cm active fixation pacing lead, serial #[id number] was advanced into the right ventricle and the St. Jude model 2088T, 46-cm active fixation pacing lead, serial #[id number] was advanced into the right atrium. St. Jude QuickFlex [id number] lead was maneuvered back approximately one-third from base to apex along the anterior vein, and in this location, there was satisfactory threshold and no diaphragmatic stimulation. Per operative report, "While this was not the most ideal location for LV lead placement, it was deemed most appropriate given the present set of circumstances."  St. Jude Anthem RF BiV pacemaker, serial [id number].  History and Hospital Course:  Please see Dr. Ludwig Clarks recent office note for full details of history. 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 The patient had mitral valvuloplasty at [facility] in the late 90s. In 2007 she had a mechanical mitral valve replacement at [facility]. 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 She also has antiphospholipid antibody syndrome and is on chronic Arixtra injections. She's had a prior subdural hematoma that required evacuation. She also has a history of heparin-induced thrombocytopenia. Her last echocardiogram was performed in July of 2013. This revealed an ejection fraction of 55-65%, a with normally functioning prosthetic mitral valve, s/p TV repair, mild left atrial enlargement. There was mild AS and trace AI. She is followed by Dr Johney Frame for SVT and is s/p ablation at [facility]. She also has h/o torsades with amiodarone. She was seen recently by Dr. Graciela Husbands for atrial flutter in addition to Dr. Cyndie Chime who felt she was therapeutically anticoagulated. Cardioversion was to be arranged. However, when she presented she was not in atrial flutter. Her electrocardiogram showed junctional rhythm with a heart rate of 38. She presented yesterday for biventricular pacemaker implantation with Dr. Ladona Ridgel. [name] tolerated this procedure well without any immediate complication. She remains hemodynamically stable and afebrile. Her chest xray shows stable lead placement without pneumothorax. Her device interrogation shows normal BiV PPM function with stable lead parameters/measurements. Her implant site is intact without significant bleeding or hematoma. She has been given discharge instructions including wound care and activity restrictions. She will follow-up in 10 days for wound check. There were no changes made to her medications. She has been seen, examined and deemed stable for discharge today by Dr. Verne Carrow.  Discharge Vitals: Blood pressure 137/85, pulse 69, temperature 98.1 F (36.7 C), temperature source Oral, resp. rate 21, height 5\' 3"  (1.6 m), weight 150 lb (68.04 kg), SpO2 98.00%.   Labs: None recent, in last 48-72 hours   Disposition:  The patient is being  discharged in stable  condition.  Follow-up: Follow-up Information    Follow up with [facility] On 04/08/2012. (At 12:00 noon for wound check)    Contact information:   [address]  [address] [place] [address] [phone]      Follow up with Hillis Range, MD. On 07/12/2012. (At 10:30 AM)    Contact information:   [address]  [address] [place] [address] [phone]      Follow up with Olga Millers, MD. On 06/11/2012. (At 10:30 AM)    Contact information:   [address] [place] [address] [phone]       Discharge Medications:    Medication List     As of 03/29/2012 10:30 AM    TAKE these medications         AMOXICILLIN PO   Take 4 capsules by mouth as directed. ONLY WHEN VISITING DENTIST      CAL-MAG PO   Take 1 tablet by mouth daily.      FEMRING 0.05 MG/24HR Ring   Generic drug: Estradiol Acetate   Place 1 each vaginally as directed.      fish oil-omega-3 fatty acids 1000 MG capsule   Take 1 g by mouth daily.      fondaparinux 2.5 MG/0.5ML Soln   Commonly known as: ARIXTRA   Inject 0.5 mLs (2.5 mg total) into the skin every other day. HOLD dose today 03/29/2012. RESUME usual home regimen on Saturday, 03/30/2012.      levothyroxine 75 MCG tablet   Commonly known as: SYNTHROID, LEVOTHROID   Take 75 mcg by mouth daily.      lisinopril 5 MG tablet   Commonly known as: PRINIVIL,ZESTRIL   Take 2.5 mg by mouth 2 (two) times daily.      NON FORMULARY   Take 1 tablet by mouth every other day. Suprema dophilus UAD      rosuvastatin 5 MG tablet   Commonly known as: CRESTOR   Take 5 mg by mouth daily.      Vitamin D 2000 UNITS tablet   Take 2,000 Units by mouth daily.      Duration of Discharge Encounter: Greater than 30 minutes including physician time.  Signed, Abrham Maslowski, Nehemiah Settle, PA-C 03/29/2012, 10:30 AM

## 2012-03-29 NOTE — Op Note (Signed)
[name], [name] NO.:  [id number]  MEDICAL RECORD NO.:  [id number]  LOCATION:  6529                         FACILITY:  [facility]  PHYSICIAN:  Doylene Canning. Ladona Ridgel, MD    DATE OF BIRTH:  [birth year]  DATE OF PROCEDURE:  03/28/2012 DATE OF DISCHARGE:                              OPERATIVE REPORT   SURGEON:  Doylene Canning. Ladona Ridgel, MD  PROCEDURE PERFORMED:  Insertion of a biventricular pacemaker.  INDICATION:  Chronic systolic heart failure.  EF of 30-35%, class II, left bundle-branch block with a QRS duration of 140 msec, and sinus bradycardia, after right atrial flutter ablation.  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  She is status post mitral valve replacement, tricuspid valve repair.  She subsequently developed bradycardia and is referred for insertion of a biventricular pacemaker secondary to all of the above.  PROCEDURE:  After informed consent was obtained, the patient was taken to the Diagnostic EP Lab in a fasting state.  After the usual preparation and draping, intravenous fentanyl and midazolam was given for sedation.  A 30 mL of lidocaine was infiltrated into the left infraclavicular region.  A 6-cm incision was carried out over this region.  Electrocautery was utilized to dissect down to the fascial plane.  The left subclavian vein was punctured x3, and the St. Jude model 2088T 52-cm active fixation pacing lead, serial #[id number] was advanced into the right ventricle and the St. Jude model 2088T, 46-cm active fixation pacing lead, serial #[id number] was advanced into the right atrium.  Mapping was carried out in the right ventricle.  At the final site on the RV septum, the R-waves measured 7 mV.  The pacing impedance was 450 ohms and the threshold less than a V at 0.4 msec.  A large injury current was present with active fixation lead.  With the right  ventricular lead in satisfactory position, attention was then turned to placement of the atrial lead.  The patient was in atrial flutter at baseline.  The flutter waves measured 0.5-0.8 mV.  The lead was actively fixed.  The pace impedance was 358 ohms.  10 V pacing in the atrium and the ventricle did not stimulate the diaphragm.  With both the atrium and ventricle in satisfactory position, attention was then turned to placement of the left ventricular lead.  The coronary sinus guiding catheter was advanced into the coronary sinus and the coronary sinus was cannulated without difficulty.  Because of the patient's creatinine was elevated at 2.4 at baseline, care was taken not to give her any IV dye.  After approximately 20 minutes of maneuvering of the 0.014 guidewire, the lateral veins could not be cannulated.  It was appreciated that they were present, but a 10 mL bolus of IV contrast was injected into the coronary sinus and venography carried out.  This demonstrated 2 veins that were potential for LV placement.  The first vein was a lateral vein, which had a 360-degree corkscrew and a very acute angle.  Multiple 0.014 guidewires could be advanced into the vein about halfway from base to apex, but unfortunately, the bipolar LV pacing  lead could not traverse the very extreme tortuosity of this vein. Attention was then turned to the second vein which was a slightly more lateral vein.  It had a shepherd's crook takeoff, and again, the 0.014 guidewire could be advanced into this high lateral vein but could not be advanced further into the vein as it was fairly small and tortuous.  At this point, the anterior vein was cannulated and the 0.014 guidewire advanced into the anterior vein, and in the slight lateral branch, the patient's pacing threshold was greater than 5 V.  The pacing lead was maneuvered back approximately one-third from base to apex along the anterior vein, and in this location,  there was satisfactory threshold and no diaphragmatic stimulation.  While this was not the most ideal location for LV lead placement, it was deemed most appropriate given the presence set of circumstances and the LV lead was a St. Jude QuickFlex 1258T lead was deployed in this location.  A 10 V pacing did not stimulate the diaphragm.  The threshold was less than 2 V at 0.8 msec, and the impedance was 950 ohms.  With these satisfactory parameters, the guiding catheter was liberated from the LV lead in the usual manner. All leads were secured to the subpectoral fascia with a figure-of-eight silk suture, and the sewing sleeve was secured with silk suture. Electrocautery was utilized to make subcutaneous pocket.  Antibiotic irrigation was utilized to irrigate the pocket, and  electrocautery was utilized to assure hemostasis.  The St. Jude Anthem RF BiV pacemaker, serial [id number] was connected to the right atrial, right ventricular, and left ventricular leads and placed back in the subcutaneous pocket. Antibiotic irrigation was utilized to irrigate the pocket, and electrocautery was utilized to assure hemostasis.  The incision was closed with 2-0 and 3-0 Vicryl.  Benzoin and Steri-Strips were painted on the skin, pressure dressing was applied, and the patient was returned to her room in satisfactory condition.  Postprocedure, the RV and LV time intervals were satisfactory and it should be noted that the patient was paced by rapid atrial pacing through the device back into sinus rhythm.     Doylene Canning. Ladona Ridgel, MD     GWT/MEDQ  D:  03/28/2012  T:  03/29/2012  Job:  [id number]  cc:   Madolyn Frieze. Jens Som, MD, [facility]

## 2012-03-29 NOTE — Discharge Summary (Signed)
See full note this am. cdm 

## 2012-04-01 ENCOUNTER — Telehealth: Payer: Self-pay | Admitting: Internal Medicine

## 2012-04-01 DIAGNOSIS — N289 Disorder of kidney and ureter, unspecified: Secondary | ICD-10-CM

## 2012-04-01 NOTE — Telephone Encounter (Signed)
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 Pt can be reached at hM#

## 2012-04-01 NOTE — Telephone Encounter (Signed)
Patient wants a BMP next week at wound ck  Dr Johney Frame spoke with patient and we have ordered the lab

## 2012-04-08 ENCOUNTER — Encounter: Payer: Self-pay | Admitting: Internal Medicine

## 2012-04-08 ENCOUNTER — Ambulatory Visit (INDEPENDENT_AMBULATORY_CARE_PROVIDER_SITE_OTHER): Payer: Medicare Other | Admitting: *Deleted

## 2012-04-08 ENCOUNTER — Other Ambulatory Visit (INDEPENDENT_AMBULATORY_CARE_PROVIDER_SITE_OTHER): Payer: Medicare Other

## 2012-04-08 DIAGNOSIS — I472 Ventricular tachycardia: Secondary | ICD-10-CM | POA: Diagnosis not present

## 2012-04-08 DIAGNOSIS — I4892 Unspecified atrial flutter: Secondary | ICD-10-CM

## 2012-04-08 DIAGNOSIS — I498 Other specified cardiac arrhythmias: Secondary | ICD-10-CM

## 2012-04-08 DIAGNOSIS — N289 Disorder of kidney and ureter, unspecified: Secondary | ICD-10-CM

## 2012-04-08 DIAGNOSIS — R001 Bradycardia, unspecified: Secondary | ICD-10-CM

## 2012-04-08 LAB — BASIC METABOLIC PANEL
BUN: 46 mg/dL — ABNORMAL HIGH (ref 6–23)
CO2: 24 mEq/L (ref 19–32)
Calcium: 8.3 mg/dL — ABNORMAL LOW (ref 8.4–10.5)
Creatinine, Ser: 1.7 mg/dL — ABNORMAL HIGH (ref 0.4–1.2)
GFR: 32.44 mL/min — ABNORMAL LOW (ref >60.00)
Glucose, Bld: 117 mg/dL — ABNORMAL HIGH (ref 70–99)
Sodium: 140 mEq/L (ref 135–145)

## 2012-04-08 LAB — PACEMAKER DEVICE OBSERVATION
AL AMPLITUDE: 1.7 mv
AL IMPEDENCE PM: 400 Ohm
ATRIAL PACING PM: 6
BAMS-0003: 70 {beats}/min
BATTERY VOLTAGE: 3.0381 V
RV LEAD IMPEDENCE PM: 487.5 Ohm
RV LEAD THRESHOLD: 0.875 V
VENTRICULAR PACING PM: 46

## 2012-04-08 NOTE — Progress Notes (Signed)
Wound check pacer in clinic  

## 2012-04-10 ENCOUNTER — Encounter: Payer: Self-pay | Admitting: Medical Oncology

## 2012-04-10 NOTE — Progress Notes (Signed)
Patient presents asking for my chart access code.  I printed an AVS for patient with access code.

## 2012-04-16 ENCOUNTER — Other Ambulatory Visit: Payer: Self-pay | Admitting: *Deleted

## 2012-04-16 DIAGNOSIS — D6859 Other primary thrombophilia: Secondary | ICD-10-CM

## 2012-04-16 MED ORDER — FONDAPARINUX SODIUM 2.5 MG/0.5ML ~~LOC~~ SOLN: 2.5000 mg | SUBCUTANEOUS | Status: DC

## 2012-04-17 ENCOUNTER — Encounter: Payer: Self-pay | Admitting: Internal Medicine

## 2012-04-17 ENCOUNTER — Ambulatory Visit (INDEPENDENT_AMBULATORY_CARE_PROVIDER_SITE_OTHER): Payer: Medicare Other | Admitting: Internal Medicine

## 2012-04-17 VITALS — BP 115/76 | HR 106 | Wt 157.0 lb

## 2012-04-17 DIAGNOSIS — I4891 Unspecified atrial fibrillation: Secondary | ICD-10-CM | POA: Insufficient documentation

## 2012-04-17 DIAGNOSIS — I495 Sick sinus syndrome: Secondary | ICD-10-CM | POA: Insufficient documentation

## 2012-04-17 DIAGNOSIS — I4892 Unspecified atrial flutter: Secondary | ICD-10-CM | POA: Diagnosis not present

## 2012-04-17 DIAGNOSIS — I447 Left bundle-branch block, unspecified: Secondary | ICD-10-CM

## 2012-04-17 LAB — PACEMAKER DEVICE OBSERVATION
AL IMPEDENCE PM: 400 Ohm
BAMS-0001: 150 {beats}/min
BAMS-0003: 70 {beats}/min
DEVICE MODEL PM: 2786353

## 2012-04-17 MED ORDER — DILTIAZEM HCL ER COATED BEADS 240 MG PO CP24
240.0000 mg | ORAL_CAPSULE | Freq: Every day | ORAL | Status: DC
Start: 2012-04-17 — End: 2012-05-02

## 2012-04-17 NOTE — Progress Notes (Signed)
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  She recently underwent CRT-P implant by Dr Ladona Ridgel for tachy/brady syndrome.  She continues to have atrial arrhythmias despite 2 ablated for atrial tachycardia at [facility].  She also has tachycardia bradycardia syndrome.  She continues to recover s/p PPM but has had difficulty with afib with RVR.  She reports symptoms of fatigue and breathlessness.  Today, she denies symptoms of palpitations, chest pain, ower extremity edema, dizziness, presyncope, or syncope.  The patient is otherwise without complaint today.   Past Medical History  Diagnosis Date  . Rheumatic heart disease 1995    post percutaneous valvuloplasty at [facility] with subsequent mechanical mitral valve replacement and tricuspid annuloplasty at [facility]  . Subdural hematoma 2009    on coumadin  . Heparin-induced thrombocytopenia   . HTN (hypertension)   . History of Hashimoto thyroiditis   . HLD (hyperlipidemia)   . Atrial flutter     atypical right atrial flutter s/p ablation at [facility] by Dr Smith Robert  . Other activity     Torsades with amiodarone  . Pacemaker   . Antiphospholipid antibody syndrome   . Anemia   . Heart murmur   . CHF (congestive heart failure)   . Hypothyroidism   . History of blood transfusion     "lots; most in 2007 w/MVR OR"  . Cerebrovascular accident ?1990; 1993; 2007    residual "can't use my right hand; left visual field cut" (03/28/2012)  . Arthritis     "may have some" (03/28/2012)  . Chronic kidney disease     "as a result of my heart failing" (03/28/2012)  . Fine motor impairment     "fingers right hand" (03/28/2012)   Past Surgical History  Procedure Date  . Mitral valve replacement 2007    with tricuspid annuloplasty  . Amputation     left atrial appendage amputated with MVR  . Craniotomy 2008    for SDH  . Cholecystectomy   . Tonsillectomy   .  Percutaneous balloon valvuloplasty 1995    At [facility] with subsequent mechanical mitral valve replacement and tricuspid annuloplasty at [facility]  . Atrial ablation surgery     CTI and R atriostomy scar flutter ablation at [facility] 8/12,  repeat  ablation at [facility]  9/12  . Cardiac valve replacement   . Tonsillectomy     "when I was a child"  . Total abdominal hysterectomy ?1995    TAH; BSO  . Biventricular pacemaker placement 03/28/2012    SJM Anthem implanted by Dr Ladona Ridgel  . Cardioversion 2010-2011    "3 at [facility]" (03/28/2012)  . Cardiac electrophysiology mapping and ablation 2012    "2 @ [facility]" (03/28/2012    Current Outpatient Prescriptions  Medication Sig Dispense Refill  . AMOXICILLIN PO Take 4 capsules by mouth as directed. ONLY WHEN VISITING DENTIST      . Calcium-Magnesium (CAL-MAG PO) Take 1 tablet by mouth daily.       . Cholecalciferol (VITAMIN D) 2000 UNITS tablet Take 2,000 Units by mouth daily.        . Estradiol Acetate (FEMRING) 0.05 MG/24HR RING Place 1 each vaginally as directed.       . fish oil-omega-3 fatty acids 1000 MG capsule Take 1 g by mouth daily.       . fondaparinux (ARIXTRA) 2.5 MG/0.5ML SOLN Inject 0.5 mLs (2.5 mg total)  into the skin every other day. HOLD dose today 03/29/2012. RESUME usual home regimen on Saturday, 03/30/2012.  3.5 mL  2  . levothyroxine (SYNTHROID, LEVOTHROID) 75 MCG tablet Take 75 mcg by mouth daily.        Marland Kitchen lisinopril (PRINIVIL,ZESTRIL) 5 MG tablet Take 2.5 mg by mouth 2 (two) times daily.       . NON FORMULARY Take 1 tablet by mouth every other day. Suprema dophilus UAD      . rosuvastatin (CRESTOR) 5 MG tablet Take 5 mg by mouth daily.        Marland Kitchen diltiazem (CARDIZEM CD) 240 MG 24 hr capsule Take 1 capsule (240 mg total) by mouth daily.  90 capsule  3    Physical Exam: Filed Vitals:   04/17/12 1152  BP: 115/76  Pulse: 106  Weight: 157 lb (71.215 kg)    GEN- The patient is well appearing, alert and oriented x 3 today.   Head-  normocephalic, atraumatic Eyes-  Sclera clear, conjunctiva pink Ears- hearing intact Oropharynx- clear Lungs- Clear to ausculation bilaterally, normal work of breathing Chest- pacemaker pocket is well healed Heart- irregular rate and rhythm, no murmurs, rubs or gallops, PMI not laterally displaced GI- soft, NT, ND, + BS Extremities- no clubbing, cyanosis, or edema  Pacemaker interrogation- reviewed in detail today,  See PACEART report  Assessment and Plan:  1. Afib Ms Reyburn has had longstanding struggles with atrial arrhythmias.  She has had two ablations at [facility].  She has not tolerated antiarrhythmic medicine and had torsades with amiodarone.  Presently,her heart rates are consistently elevated.  We had a long discussion today regarding treatment options.  Our options are rate controlling drugs vs AV nodal ablation. At this point, she would like to try AV rate control with medicine.  I agree with this approach. We will start cardizem CD 240mg  daily today.  She will return in 2 weeks for further rate control.   2. LBBB- she has a LBBB and is s/p CRT-P She is not BiV pacing adequately due to elevated V rates Her lower pacing rate is increased to 80 bpm today.  I have also added diltiazem as above  3. Tachy/brady syndrome.  Normal pacemaker function See Arita Miss Art report

## 2012-04-17 NOTE — Patient Instructions (Addendum)
Your physician recommends that you schedule a follow-up appointment in: 2 weeks with Dr Johney Frame  Your physician has recommended you make the following change in your medication:  1) Start Cardizem 240mg  daily

## 2012-04-22 ENCOUNTER — Other Ambulatory Visit: Payer: PRIVATE HEALTH INSURANCE

## 2012-05-02 ENCOUNTER — Encounter: Payer: Self-pay | Admitting: Internal Medicine

## 2012-05-02 ENCOUNTER — Telehealth: Payer: Self-pay | Admitting: Internal Medicine

## 2012-05-02 ENCOUNTER — Ambulatory Visit (INDEPENDENT_AMBULATORY_CARE_PROVIDER_SITE_OTHER): Payer: Medicare Other | Admitting: Internal Medicine

## 2012-05-02 VITALS — BP 122/74 | HR 84 | Ht 63.0 in | Wt 156.0 lb

## 2012-05-02 DIAGNOSIS — I4891 Unspecified atrial fibrillation: Secondary | ICD-10-CM

## 2012-05-02 DIAGNOSIS — R001 Bradycardia, unspecified: Secondary | ICD-10-CM

## 2012-05-02 DIAGNOSIS — I498 Other specified cardiac arrhythmias: Secondary | ICD-10-CM | POA: Diagnosis not present

## 2012-05-02 MED ORDER — DILTIAZEM HCL ER COATED BEADS 180 MG PO CP24: ORAL_CAPSULE | ORAL | Status: DC

## 2012-05-02 MED ORDER — DILTIAZEM HCL ER COATED BEADS 360 MG PO TB24: 360.0000 mg | ORAL_TABLET | Freq: Every day | ORAL | Status: DC

## 2012-05-02 NOTE — Telephone Encounter (Signed)
Spoke with pharmacist and lmom for patient

## 2012-05-02 NOTE — Patient Instructions (Addendum)
Your physician recommends that you schedule a follow-up appointment in: 6 weeks with Dr Johney Frame  Your physician has recommended you make the following change in your medication:  1) Increase Cardizem to 360mg  daily

## 2012-05-02 NOTE — Telephone Encounter (Signed)
The new medication will cost her $700 a month so if you can call the Pharm and give a different dose it will be cheaper you speak with Pharm or her and they will explain

## 2012-05-03 LAB — PACEMAKER DEVICE OBSERVATION
BAMS-0003: 70 {beats}/min
DEVICE MODEL PM: 2786353

## 2012-05-05 NOTE — Progress Notes (Signed)
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  She recently underwent CRT-P implant by Dr Ladona Ridgel for tachy/brady syndrome.  She continues to have atrial arrhythmias despite 2 ablated for atrial tachycardia at [facility].  She also has tachycardia bradycardia syndrome.  Her fatigue and breathlessness continue to improve.  Today, she denies symptoms of palpitations, chest pain, ower extremity edema, dizziness, presyncope, or syncope.  The patient is otherwise without complaint today.   Past Medical History  Diagnosis Date  . Rheumatic heart disease 1995    post percutaneous valvuloplasty at [facility] with subsequent mechanical mitral valve replacement and tricuspid annuloplasty at [facility]  . Subdural hematoma 2009    on coumadin  . Heparin-induced thrombocytopenia   . HTN (hypertension)   . History of Hashimoto thyroiditis   . HLD (hyperlipidemia)   . Atrial flutter     atypical right atrial flutter s/p ablation at [facility] by Dr Smith Robert  . Other activity     Torsades with amiodarone  . Pacemaker   . Antiphospholipid antibody syndrome   . Anemia   . Heart murmur   . CHF (congestive heart failure)   . Hypothyroidism   . History of blood transfusion     "lots; most in 2007 w/MVR OR"  . Cerebrovascular accident ?1990; 1993; 2007    residual "can't use my right hand; left visual field cut" (03/28/2012)  . Arthritis     "may have some" (03/28/2012)  . Chronic kidney disease     "as a result of my heart failing" (03/28/2012)  . Fine motor impairment     "fingers right hand" (03/28/2012)   Past Surgical History  Procedure Date  . Mitral valve replacement 2007    with tricuspid annuloplasty  . Amputation     left atrial appendage amputated with MVR  . Craniotomy 2008    for SDH  . Cholecystectomy   . Tonsillectomy   . Percutaneous balloon valvuloplasty 1995    At [facility] with subsequent  mechanical mitral valve replacement and tricuspid annuloplasty at [facility]  . Atrial ablation surgery     CTI and R atriostomy scar flutter ablation at [facility] 8/12,  repeat  ablation at [facility]  9/12  . Cardiac valve replacement   . Tonsillectomy     "when I was a child"  . Total abdominal hysterectomy ?1995    TAH; BSO  . Biventricular pacemaker placement 03/28/2012    SJM Anthem implanted by Dr Ladona Ridgel  . Cardioversion 2010-2011    "3 at [facility]" (03/28/2012)  . Cardiac electrophysiology mapping and ablation 2012    "2 @ [facility]" (03/28/2012    Current Outpatient Prescriptions  Medication Sig Dispense Refill  . AMOXICILLIN PO Take 4 capsules by mouth as directed. ONLY WHEN VISITING DENTIST      . Calcium-Magnesium (CAL-MAG PO) Take 1 tablet by mouth daily.       . Cholecalciferol (VITAMIN D) 2000 UNITS tablet Take 2,000 Units by mouth daily.        . Estradiol Acetate (FEMRING) 0.05 MG/24HR RING Place 1 each vaginally as directed.       . fish oil-omega-3 fatty acids 1000 MG capsule Take 1 g by mouth daily.       . fondaparinux (ARIXTRA) 2.5 MG/0.5ML SOLN Inject 0.5 mLs (2.5 mg total) into the skin every other day. HOLD dose today 03/29/2012. RESUME usual home regimen on  Saturday, 03/30/2012.  3.5 mL  2  . levothyroxine (SYNTHROID, LEVOTHROID) 75 MCG tablet Take 75 mcg by mouth daily.        Marland Kitchen lisinopril (PRINIVIL,ZESTRIL) 5 MG tablet Take 2.5 mg by mouth 2 (two) times daily.       . NON FORMULARY Take 1 tablet by mouth every other day. Suprema dophilus UAD      . rosuvastatin (CRESTOR) 5 MG tablet Take 5 mg by mouth daily.        Marland Kitchen diltiazem (CARDIZEM CD) 180 MG 24 hr capsule Take 2 by mouth daily  270 capsule  3    Physical Exam: Filed Vitals:   05/02/12 1104  BP: 122/74  Pulse: 84  Height: 5\' 3"  (1.6 m)  Weight: 156 lb (70.761 kg)  SpO2: 97%    GEN- The patient is well appearing, alert and oriented x 3 today.   Head- normocephalic, atraumatic Eyes-  Sclera clear,  conjunctiva pink Ears- hearing intact Oropharynx- clear Lungs- Clear to ausculation bilaterally, normal work of breathing Chest- pacemaker pocket is well healed Heart- RRR (paced) GI- soft, NT, ND, + BS Extremities- no clubbing, cyanosis, or edema  Pacemaker interrogation- reviewed in detail today,  See PACEART report  Assessment and Plan:  1. Afib Ms Bejarano has had longstanding struggles with atrial arrhythmias.  She has had two ablations at [facility].  She has not tolerated antiarrhythmic medicine and had torsades with amiodarone.  Her V rates are improved, though she remains <95% biV paced.  At this point I will increase cardizem CD to 360mg  daily.  She would like to avoid AV nodal ablation at this time.  Normal pacemaker function See Arita Miss Art report

## 2012-05-13 DIAGNOSIS — Z800 Family history of malignant neoplasm of digestive organs: Secondary | ICD-10-CM | POA: Diagnosis not present

## 2012-05-13 DIAGNOSIS — Z79899 Other long term (current) drug therapy: Secondary | ICD-10-CM | POA: Diagnosis not present

## 2012-05-13 DIAGNOSIS — E785 Hyperlipidemia, unspecified: Secondary | ICD-10-CM | POA: Diagnosis not present

## 2012-05-13 DIAGNOSIS — I6330 Cerebral infarction due to thrombosis of unspecified cerebral artery: Secondary | ICD-10-CM | POA: Diagnosis not present

## 2012-05-13 DIAGNOSIS — E039 Hypothyroidism, unspecified: Secondary | ICD-10-CM | POA: Diagnosis not present

## 2012-05-13 DIAGNOSIS — Z0000 Encounter for general adult medical examination without abnormal findings: Secondary | ICD-10-CM | POA: Diagnosis not present

## 2012-05-13 DIAGNOSIS — Z954 Presence of other heart-valve replacement: Secondary | ICD-10-CM | POA: Diagnosis not present

## 2012-05-13 DIAGNOSIS — I10 Essential (primary) hypertension: Secondary | ICD-10-CM | POA: Diagnosis not present

## 2012-05-16 ENCOUNTER — Other Ambulatory Visit: Payer: Self-pay | Admitting: Gastroenterology

## 2012-05-16 DIAGNOSIS — Z7901 Long term (current) use of anticoagulants: Secondary | ICD-10-CM

## 2012-05-16 DIAGNOSIS — Z800 Family history of malignant neoplasm of digestive organs: Secondary | ICD-10-CM

## 2012-05-20 ENCOUNTER — Other Ambulatory Visit (HOSPITAL_BASED_OUTPATIENT_CLINIC_OR_DEPARTMENT_OTHER): Payer: Medicare Other | Admitting: Lab

## 2012-05-20 DIAGNOSIS — D6861 Antiphospholipid syndrome: Secondary | ICD-10-CM

## 2012-05-20 DIAGNOSIS — D6859 Other primary thrombophilia: Secondary | ICD-10-CM | POA: Diagnosis not present

## 2012-05-20 DIAGNOSIS — I629 Nontraumatic intracranial hemorrhage, unspecified: Secondary | ICD-10-CM

## 2012-05-20 DIAGNOSIS — I059 Rheumatic mitral valve disease, unspecified: Secondary | ICD-10-CM

## 2012-05-20 DIAGNOSIS — D689 Coagulation defect, unspecified: Secondary | ICD-10-CM

## 2012-05-20 DIAGNOSIS — N189 Chronic kidney disease, unspecified: Secondary | ICD-10-CM | POA: Diagnosis not present

## 2012-05-20 LAB — CBC WITH DIFFERENTIAL/PLATELET
BASO%: 1.4 % (ref 0.0–2.0)
Basophils Absolute: 0.1 10*3/uL (ref 0.0–0.1)
EOS%: 4.2 % (ref 0.0–7.0)
HCT: 31.2 % — ABNORMAL LOW (ref 34.8–46.6)
HGB: 10.5 g/dL — ABNORMAL LOW (ref 11.6–15.9)
MCH: 32.1 pg (ref 25.1–34.0)
MCHC: 33.5 g/dL (ref 31.5–36.0)
MONO#: 0.3 10*3/uL (ref 0.1–0.9)
NEUT%: 73.3 % (ref 38.4–76.8)
RDW: 13.7 % (ref 11.2–14.5)
WBC: 5.8 10*3/uL (ref 3.9–10.3)
lymph#: 0.9 10*3/uL (ref 0.9–3.3)

## 2012-05-20 LAB — COMPREHENSIVE METABOLIC PANEL (CC13)
AST: 21 U/L (ref 5–34)
Albumin: 3.7 g/dL (ref 3.5–5.0)
Alkaline Phosphatase: 73 U/L (ref 40–150)
BUN: 35 mg/dL — ABNORMAL HIGH (ref 7.0–26.0)
Calcium: 8.8 mg/dL (ref 8.4–10.4)
Chloride: 107 mEq/L (ref 98–107)
Potassium: 4.3 mEq/L (ref 3.5–5.1)
Sodium: 139 mEq/L (ref 136–145)
Total Protein: 6.7 g/dL (ref 6.4–8.3)

## 2012-05-22 ENCOUNTER — Telehealth: Payer: Self-pay | Admitting: *Deleted

## 2012-05-22 NOTE — Telephone Encounter (Addendum)
Called pt & informed of lab results per Dr Cyndie Chime & she reports that she saw them on MyChart & that Dr. Eliott Nine has already seen them at her recent visit.  Pt reports that this was a peak level.  Reported to Dr Cyndie Chime.

## 2012-05-22 NOTE — Telephone Encounter (Signed)
Message copied by Sabino Snipes on Wed May 22, 2012 12:28 PM ------      Message from: Levert Feinstein      Created: Tue May 21, 2012  7:18 PM       Call Dr Shela Commons w results  Platelets stable @ 89,000; creatinine stable @ 1.8; lovenox (arixtra) level stable at 0.88: was this a peak value?      Forward cc labs to Dr Camille Bal

## 2012-06-04 ENCOUNTER — Ambulatory Visit
Admission: RE | Admit: 2012-06-04 | Discharge: 2012-06-04 | Disposition: A | Discharge status: Home / Self Care | Payer: Medicare Other | Source: Ambulatory Visit | Attending: Gastroenterology | Admitting: Gastroenterology

## 2012-06-04 DIAGNOSIS — Z79899 Other long term (current) drug therapy: Secondary | ICD-10-CM | POA: Diagnosis not present

## 2012-06-04 DIAGNOSIS — Z7901 Long term (current) use of anticoagulants: Secondary | ICD-10-CM

## 2012-06-04 DIAGNOSIS — Z800 Family history of malignant neoplasm of digestive organs: Secondary | ICD-10-CM | POA: Diagnosis not present

## 2012-06-04 DIAGNOSIS — K5730 Diverticulosis of large intestine without perforation or abscess without bleeding: Secondary | ICD-10-CM | POA: Diagnosis not present

## 2012-06-11 ENCOUNTER — Ambulatory Visit: Payer: Medicare Other | Admitting: Cardiology

## 2012-06-18 ENCOUNTER — Other Ambulatory Visit: Payer: Self-pay | Admitting: *Deleted

## 2012-06-18 DIAGNOSIS — D6859 Other primary thrombophilia: Secondary | ICD-10-CM

## 2012-06-18 MED ORDER — FONDAPARINUX SODIUM 2.5 MG/0.5ML ~~LOC~~ SOLN: 2.5000 mg | SUBCUTANEOUS | Status: DC

## 2012-06-20 ENCOUNTER — Other Ambulatory Visit: Payer: Self-pay | Admitting: *Deleted

## 2012-06-20 NOTE — Telephone Encounter (Signed)
ON 06/17/12 A FOURTEEN DAY SUPPLY WAS GIVEN. CALLED THE PHARMACIST, PHILLIP. PT. USUALLY RECEIVES A MONTH SUPPLY. AUTHORIZED A MONTH SUPPLY FOR PT.

## 2012-07-04 DIAGNOSIS — K625 Hemorrhage of anus and rectum: Secondary | ICD-10-CM | POA: Diagnosis not present

## 2012-07-08 ENCOUNTER — Telehealth: Payer: Self-pay | Admitting: Oncology

## 2012-07-08 ENCOUNTER — Ambulatory Visit (HOSPITAL_BASED_OUTPATIENT_CLINIC_OR_DEPARTMENT_OTHER): Payer: Medicare Other | Admitting: Oncology

## 2012-07-08 ENCOUNTER — Other Ambulatory Visit (HOSPITAL_BASED_OUTPATIENT_CLINIC_OR_DEPARTMENT_OTHER): Payer: Medicare Other | Admitting: Lab

## 2012-07-08 VITALS — BP 113/65 | HR 81 | Temp 97.6°F | Resp 18 | Ht 63.0 in | Wt 161.0 lb

## 2012-07-08 DIAGNOSIS — D6861 Antiphospholipid syndrome: Secondary | ICD-10-CM

## 2012-07-08 DIAGNOSIS — D696 Thrombocytopenia, unspecified: Secondary | ICD-10-CM | POA: Diagnosis not present

## 2012-07-08 DIAGNOSIS — D689 Coagulation defect, unspecified: Secondary | ICD-10-CM

## 2012-07-08 DIAGNOSIS — I629 Nontraumatic intracranial hemorrhage, unspecified: Secondary | ICD-10-CM | POA: Diagnosis not present

## 2012-07-08 DIAGNOSIS — D6859 Other primary thrombophilia: Secondary | ICD-10-CM

## 2012-07-08 DIAGNOSIS — I4891 Unspecified atrial fibrillation: Secondary | ICD-10-CM | POA: Diagnosis not present

## 2012-07-08 DIAGNOSIS — N189 Chronic kidney disease, unspecified: Secondary | ICD-10-CM

## 2012-07-08 DIAGNOSIS — Z7901 Long term (current) use of anticoagulants: Secondary | ICD-10-CM | POA: Insufficient documentation

## 2012-07-08 DIAGNOSIS — H2510 Age-related nuclear cataract, unspecified eye: Secondary | ICD-10-CM | POA: Diagnosis not present

## 2012-07-08 LAB — COMPREHENSIVE METABOLIC PANEL (CC13)
AST: 19 U/L (ref 5–34)
Albumin: 3.7 g/dL (ref 3.5–5.0)
Alkaline Phosphatase: 81 U/L (ref 40–150)
BUN: 37 mg/dL — ABNORMAL HIGH (ref 7.0–26.0)
Creatinine: 1.7 mg/dL — ABNORMAL HIGH (ref 0.6–1.1)
Glucose: 124 mg/dl — ABNORMAL HIGH (ref 70–99)
Potassium: 4.5 mEq/L (ref 3.5–5.1)
Total Bilirubin: 0.45 mg/dL (ref 0.20–1.20)

## 2012-07-08 LAB — CBC WITH DIFFERENTIAL/PLATELET
BASO%: 1.3 % (ref 0.0–2.0)
EOS%: 3.5 % (ref 0.0–7.0)
MCH: 31.6 pg (ref 25.1–34.0)
MCHC: 33.3 g/dL (ref 31.5–36.0)
MCV: 95 fL (ref 79.5–101.0)
MONO%: 4.6 % (ref 0.0–14.0)
RBC: 3.6 10*6/uL — ABNORMAL LOW (ref 3.70–5.45)
RDW: 13 % (ref 11.2–14.5)
lymph#: 1 10*3/uL (ref 0.9–3.3)

## 2012-07-08 LAB — HEPARIN ANTI-XA: Heparin LMW: 0.51 IU/mL

## 2012-07-08 NOTE — Telephone Encounter (Signed)
Gave pt appt for March 3rd and May 2014 lab and MD

## 2012-07-08 NOTE — Progress Notes (Signed)
Hematology and Oncology Follow Up Visit  [name] [id number] 11-26-1945 67 y.o. 07/08/2012 7:58 PM   Principle Diagnosis: Encounter Diagnoses  Name Primary?  . THROMBOCYTOPENIA, CHRONIC   . Antiphospholipid antibody with hypercoagulable state Yes  . Chronic anticoagulation   . Atrial fibrillation      Interim History:   Followup visit for this pleasant, 67 year old, forced to retire gynecologic oncologist. She has the antiphospholipid antibody syndrome. She has had multiple  previous arterial  embolic events. She developed acute mitral valve failure in October 2007 and underwent emergency placement of the mitral valve with preop complication of DIC and postoperative complication of acute right cerebrovascular accident resulting in left hemiparesis. She was on chronic Coumadin anticoagulation until she sustained recurrent subdural hematomas and Coumadin was stopped. She has been maintained on Arixtra in view of previous history of heparin associated thrombocytopenia without associated thrombosis. Due to chronic renal insufficiency, she receives it every other day dose of 2.5 mg. We have been monitoring peak and trough levels which have been stable.  Most of the problems that she has had over the last 2 years related to refractory atrial fibrillation. She has been cardioverted twice. She had a radiofrequency ablation procedure. She had a biventricular pacemaker placed in October 2013.  Her activity level is good at this time. She goes to the gym to exercise twice a week. She is not having any chest pain. No significant palpitations.  Medications: reviewed  Allergies:  Allergies  Allergen Reactions  . Amiodarone Hcl Other (See Comments)    "torsades; v tach"  . 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 Vitamin K Anaphylaxis    IV only allergy  . Iodinated Diagnostic Agents Itching    "over 35 years ago" (03/28/2012)    Review of Systems: Constitutional:   No constitutional  symptoms Respiratory: No cough or dyspnea Cardiovascular:  See above Gastrointestinal: No change in bowel habit Genito-Urinary: Not questioned Musculoskeletal: No muscle or bone pain Neurologic: No headache or change in vision Skin: No rash or ecchymosis Remaining ROS negative.  Physical Exam: Blood pressure 113/65, pulse 81, temperature 97.6 F (36.4 C), temperature source Oral, resp. rate 18, height 5\' 3"  (1.6 m), weight 161 lb (73.029 kg). Wt Readings from Last 3 Encounters:  07/08/12 161 lb (73.029 kg)  05/02/12 156 lb (70.761 kg)  04/17/12 157 lb (71.215 kg)     General appearance: Well-nourished Caucasian woman HENNT: Pharynx no erythema or exudate Lymph nodes: No adenopathy Breasts: Lungs: Clear to auscultation resonant to percussion Heart: Regular rhythm. Mechanical heart valve sounds Abdomen: Soft, nontender, no mass, no organomegaly Extremities: No edema, no calf tenderness Vascular: No cyanosis, no carotid bruits Neurologic: Persistent weakness and clumsiness of the left hand with contracture of the index finger Skin: No rash or ecchymosis  Lab Results: Lab Results  Component Value Date   WBC 5.9 07/08/2012   HGB 11.4* 07/08/2012   HCT 34.2* 07/08/2012   MCV 95.0 07/08/2012   PLT 88* 07/08/2012     Chemistry      Component Value Date/Time   NA 138 07/08/2012 1420   NA 140 04/08/2012 1102   K 4.5 07/08/2012 1420   K 4.6 04/08/2012 1102   CL 107 07/08/2012 1420   CL 108 04/08/2012 1102   CO2 22 07/08/2012 1420   CO2 24 04/08/2012 1102   BUN 37.0* 07/08/2012 1420   BUN 46* 04/08/2012 1102   CREATININE 1.7* 07/08/2012 1420   CREATININE 1.7*  04/08/2012 1102      Component Value Date/Time   CALCIUM 9.3 07/08/2012 1420   CALCIUM 8.3* 04/08/2012 1102   ALKPHOS 81 07/08/2012 1420   ALKPHOS 61 02/05/2012 1042   AST 19 07/08/2012 1420   AST 24 02/05/2012 1042   ALT 12 07/08/2012 1420   ALT 18 02/05/2012 1042   BILITOT 0.45 07/08/2012 1420   BILITOT 0.5 02/05/2012 1042        Radiological Studies: No results found.  Impression and Plan: #1. Antiphospholipid antibody syndrome She remains stable on Arixtra anticoagulation. Plan: Continue long-term anticoagulation.  #2. Chronic mild thrombocytopenia secondary to #1  #3. Chronic renal insufficiency likely due to perioperative complications at time of mitral valve replacement in 2007.  #4. Mitral valve disease likely secondary to #1.  #5. Atrial fibrillation/flutter-see discussion above  #6. Essential hypertension  #7. Hypothyroid on replacement    CC:. Dr. Carley Hammed; Dr. Olga Millers; Dr. Camille Bal; Dr. Fayrene Fearing love; Dr. Marden Noble   Levert Feinstein, MD 1/13/20147:58 PM

## 2012-07-12 ENCOUNTER — Encounter: Payer: Medicare Other | Admitting: Internal Medicine

## 2012-07-12 ENCOUNTER — Ambulatory Visit (INDEPENDENT_AMBULATORY_CARE_PROVIDER_SITE_OTHER): Payer: Medicare Other | Admitting: Internal Medicine

## 2012-07-12 ENCOUNTER — Other Ambulatory Visit: Payer: Self-pay | Admitting: *Deleted

## 2012-07-12 ENCOUNTER — Encounter: Payer: Self-pay | Admitting: Internal Medicine

## 2012-07-12 VITALS — BP 104/72 | HR 82 | Ht 63.0 in | Wt 161.0 lb

## 2012-07-12 DIAGNOSIS — D6859 Other primary thrombophilia: Secondary | ICD-10-CM

## 2012-07-12 DIAGNOSIS — I4892 Unspecified atrial flutter: Secondary | ICD-10-CM

## 2012-07-12 DIAGNOSIS — I498 Other specified cardiac arrhythmias: Secondary | ICD-10-CM | POA: Diagnosis not present

## 2012-07-12 DIAGNOSIS — I5042 Chronic combined systolic (congestive) and diastolic (congestive) heart failure: Secondary | ICD-10-CM

## 2012-07-12 DIAGNOSIS — I495 Sick sinus syndrome: Secondary | ICD-10-CM | POA: Diagnosis not present

## 2012-07-12 DIAGNOSIS — R001 Bradycardia, unspecified: Secondary | ICD-10-CM

## 2012-07-12 DIAGNOSIS — I4891 Unspecified atrial fibrillation: Secondary | ICD-10-CM | POA: Diagnosis not present

## 2012-07-12 DIAGNOSIS — Z9889 Other specified postprocedural states: Secondary | ICD-10-CM

## 2012-07-12 LAB — PACEMAKER DEVICE OBSERVATION
ATRIAL PACING PM: 1
BAMS-0003: 80 {beats}/min
BRDY-0002RV: 80 {beats}/min
BRDY-0004RV: 130 {beats}/min
DEVICE MODEL PM: 2786353
RV LEAD IMPEDENCE PM: 462.5 Ohm
RV LEAD THRESHOLD: 0.875 V
VENTRICULAR PACING PM: 94

## 2012-07-12 MED ORDER — FONDAPARINUX SODIUM 2.5 MG/0.5ML ~~LOC~~ SOLN: 2.5000 mg | SUBCUTANEOUS | Status: DC

## 2012-07-12 NOTE — Patient Instructions (Addendum)
Your physician recommends that you schedule a follow-up appointment in: 3 months with Dr Allred   Your physician has requested that you have an echocardiogram. Echocardiography is a painless test that uses sound waves to create images of your heart. It provides your doctor with information about the size and shape of your heart and how well your heart's chambers and valves are working. This procedure takes approximately one hour. There are no restrictions for this procedure.      

## 2012-07-12 NOTE — Progress Notes (Signed)
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  Since last being seen in our clinic, the patient reports doing very well.  Her palpitations are much improved.  Her exercise tolerance is the best its been in a while.  Today, she denies symptoms of  chest pain, shortness of breath,  lower extremity edema, dizziness, presyncope, or syncope.  The patient is otherwise without complaint today.   Past Medical History  Diagnosis Date  . Rheumatic heart disease 1995    post percutaneous valvuloplasty at [facility] with subsequent mechanical mitral valve replacement and tricuspid annuloplasty at [facility]  . Subdural hematoma 2009    on coumadin  . Heparin-induced thrombocytopenia   . HTN (hypertension)   . History of Hashimoto thyroiditis   . HLD (hyperlipidemia)   . Atrial flutter     atypical right atrial flutter s/p ablation at [facility] by Dr Smith Robert  . Other activity     Torsades with amiodarone  . Pacemaker   . Antiphospholipid antibody syndrome   . Anemia   . Heart murmur   . CHF (congestive heart failure)   . Hypothyroidism   . History of blood transfusion     "lots; most in 2007 w/MVR OR"  . Cerebrovascular accident ?1990; 1993; 2007    residual "can't use my right hand; left visual field cut" (03/28/2012)  . Arthritis     "may have some" (03/28/2012)  . Chronic kidney disease     "as a result of my heart failing" (03/28/2012)  . Fine motor impairment     "fingers right hand" (03/28/2012)   Past Surgical History  Procedure Date  . Mitral valve replacement 2007    with tricuspid annuloplasty  . Amputation     left atrial appendage amputated with MVR  . Craniotomy 2008    for SDH  . Cholecystectomy   . Tonsillectomy   . Percutaneous balloon valvuloplasty 1995    At [facility] with subsequent mechanical mitral valve replacement and tricuspid annuloplasty at  [facility]  . Atrial ablation surgery     CTI and R atriostomy scar flutter ablation at [facility] 8/12,  repeat  ablation at [facility]  9/12  . Cardiac valve replacement   . Tonsillectomy     "when I was a child"  . Total abdominal hysterectomy ?1995    TAH; BSO  . Biventricular pacemaker placement 03/28/2012    SJM Anthem implanted by Dr Ladona Ridgel  . Cardioversion 2010-2011    "3 at [facility]" (03/28/2012)  . Cardiac electrophysiology mapping and ablation 2012    "2 @ [facility]" (03/28/2012    Current Outpatient Prescriptions  Medication Sig Dispense Refill  . acetaminophen (TYLENOL) 650 MG CR tablet Take 650 mg by mouth at bedtime.      . AMOXICILLIN PO Take 4 capsules by mouth as directed. ONLY WHEN VISITING DENTIST      . Calcium-Magnesium (CAL-MAG PO) Take 1 tablet by mouth daily.       . Cholecalciferol (VITAMIN D) 2000 UNITS tablet Take 2,000 Units by mouth daily.        Marland Kitchen diltiazem (CARDIZEM CD) 180 MG 24 hr capsule Take 2 by mouth daily  270 capsule  3  . Estradiol Acetate (FEMRING) 0.05 MG/24HR RING Place 1 each vaginally as directed.       . fish oil-omega-3 fatty acids 1000 MG capsule Take 1 g by  mouth daily.       . fondaparinux (ARIXTRA) 2.5 MG/0.5ML SOLN Inject 0.5 mLs (2.5 mg total) into the skin every other day.  3.5 mL  0  . levothyroxine (SYNTHROID, LEVOTHROID) 75 MCG tablet Take 75 mcg by mouth daily.        Marland Kitchen lisinopril (PRINIVIL,ZESTRIL) 5 MG tablet Take 2.5 mg by mouth 2 (two) times daily.       . NON FORMULARY Take 1 tablet by mouth every other day. Suprema dophilus UAD      . rosuvastatin (CRESTOR) 5 MG tablet Take 5 mg by mouth daily.          Physical Exam: Filed Vitals:   07/12/12 1146  BP: 104/72  Pulse: 82  Height: 5\' 3"  (1.6 m)  Weight: 161 lb (73.029 kg)    GEN- The patient is well appearing, alert and oriented x 3 today.   Head- normocephalic, atraumatic Eyes-  Sclera clear, conjunctiva pink Ears- hearing intact Oropharynx- clear Lungs- Clear to  ausculation bilaterally, normal work of breathing Chest- pacemaker pocket is well healed Heart- Regular rate and rhythm (paced), mechanical S1 is crisp GI- soft, NT, ND, + BS Extremities- no clubbing, cyanosis, or edema  Pacemaker interrogation- reviewed in detail today,  See PACEART report  Assessment and Plan:  1. Tach/brady Normal BiV pacemaker function ekg today reveals afib with BiV pacing and QRS of See Pace Art report No changes today  2. afib Continue long term rate control and anticoagulation  3. Valvular heart disease Stable No change required today Echo upon return in 3months

## 2012-07-15 DIAGNOSIS — D6859 Other primary thrombophilia: Secondary | ICD-10-CM | POA: Diagnosis not present

## 2012-07-15 DIAGNOSIS — I10 Essential (primary) hypertension: Secondary | ICD-10-CM | POA: Diagnosis not present

## 2012-07-15 DIAGNOSIS — Z8679 Personal history of other diseases of the circulatory system: Secondary | ICD-10-CM | POA: Diagnosis not present

## 2012-07-15 DIAGNOSIS — S065X9A Traumatic subdural hemorrhage with loss of consciousness of unspecified duration, initial encounter: Secondary | ICD-10-CM | POA: Diagnosis not present

## 2012-07-25 ENCOUNTER — Telehealth: Payer: Self-pay | Admitting: Internal Medicine

## 2012-07-25 NOTE — Telephone Encounter (Signed)
Pt found the information you where looking for and give her a call

## 2012-07-25 NOTE — Telephone Encounter (Signed)
Spoke with patient.

## 2012-08-26 ENCOUNTER — Other Ambulatory Visit (HOSPITAL_BASED_OUTPATIENT_CLINIC_OR_DEPARTMENT_OTHER): Payer: Medicare Other | Admitting: Lab

## 2012-08-26 DIAGNOSIS — D6859 Other primary thrombophilia: Secondary | ICD-10-CM | POA: Diagnosis not present

## 2012-08-26 DIAGNOSIS — I4891 Unspecified atrial fibrillation: Secondary | ICD-10-CM

## 2012-08-26 DIAGNOSIS — Z7901 Long term (current) use of anticoagulants: Secondary | ICD-10-CM

## 2012-08-26 DIAGNOSIS — D696 Thrombocytopenia, unspecified: Secondary | ICD-10-CM

## 2012-08-26 LAB — COMPREHENSIVE METABOLIC PANEL (CC13)
Albumin: 3.7 g/dL (ref 3.5–5.0)
Alkaline Phosphatase: 78 U/L (ref 40–150)
BUN: 33.8 mg/dL — ABNORMAL HIGH (ref 7.0–26.0)
Glucose: 80 mg/dl (ref 70–99)
Potassium: 4.7 mEq/L (ref 3.5–5.1)

## 2012-08-26 LAB — CBC WITH DIFFERENTIAL/PLATELET
Basophils Absolute: 0.1 10*3/uL (ref 0.0–0.1)
EOS%: 4.3 % (ref 0.0–7.0)
HCT: 32.5 % — ABNORMAL LOW (ref 34.8–46.6)
HGB: 11 g/dL — ABNORMAL LOW (ref 11.6–15.9)
MCH: 31.5 pg (ref 25.1–34.0)
MCV: 93.1 fL (ref 79.5–101.0)
MONO%: 8 % (ref 0.0–14.0)
NEUT%: 64.6 % (ref 38.4–76.8)

## 2012-08-29 ENCOUNTER — Telehealth: Payer: Self-pay | Admitting: *Deleted

## 2012-08-29 NOTE — Telephone Encounter (Signed)
Message copied by Sabino Snipes on Thu Aug 29, 2012  5:11 PM ------      Message from: Levert Feinstein      Created: Thu Aug 29, 2012 11:25 AM       Please call pt w result - ask if this was a peak or trough lovenox level; see if there is a way to indicate paek or trough when we put in orders for future. ------

## 2012-08-29 NOTE — Telephone Encounter (Signed)
Message left for pt on identified vm to call back tomorrow for LMW hep level & whether it was a peak or trough.

## 2012-08-30 ENCOUNTER — Telehealth: Payer: Self-pay | Admitting: *Deleted

## 2012-08-30 NOTE — Telephone Encounter (Signed)
Patient called in to let us know that the Heparin levels will always be ordered as PEAK levels, unless ordered as a trough. Will notify Dr Cyndie Chime.

## 2012-09-30 ENCOUNTER — Ambulatory Visit (HOSPITAL_COMMUNITY): Payer: Medicare Other | Attending: Cardiovascular Disease

## 2012-09-30 DIAGNOSIS — I4892 Unspecified atrial flutter: Secondary | ICD-10-CM

## 2012-09-30 DIAGNOSIS — I4891 Unspecified atrial fibrillation: Secondary | ICD-10-CM | POA: Diagnosis not present

## 2012-09-30 NOTE — Progress Notes (Signed)
Echocardiogram performed.  

## 2012-10-07 ENCOUNTER — Encounter: Payer: Self-pay | Admitting: Internal Medicine

## 2012-10-07 ENCOUNTER — Ambulatory Visit (INDEPENDENT_AMBULATORY_CARE_PROVIDER_SITE_OTHER): Payer: Medicare Other | Admitting: Internal Medicine

## 2012-10-07 VITALS — BP 106/68 | HR 80 | Ht 63.0 in | Wt 162.2 lb

## 2012-10-07 DIAGNOSIS — I4891 Unspecified atrial fibrillation: Secondary | ICD-10-CM | POA: Diagnosis not present

## 2012-10-07 DIAGNOSIS — Z9889 Other specified postprocedural states: Secondary | ICD-10-CM | POA: Diagnosis not present

## 2012-10-07 DIAGNOSIS — I5042 Chronic combined systolic (congestive) and diastolic (congestive) heart failure: Secondary | ICD-10-CM

## 2012-10-07 DIAGNOSIS — I495 Sick sinus syndrome: Secondary | ICD-10-CM | POA: Diagnosis not present

## 2012-10-07 DIAGNOSIS — Z950 Presence of cardiac pacemaker: Secondary | ICD-10-CM

## 2012-10-07 NOTE — Patient Instructions (Addendum)
Your physician recommends that you schedule a follow-up appointment in: next available with Dr Jens Som      Your physician wants you to follow-up in: Oct with Dr Johney Frame Bonita Quin will receive a reminder letter in the mail two months in advance. If you don't receive a letter, please call our office to schedule the follow-up appointment.

## 2012-10-07 NOTE — Progress Notes (Signed)
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  Since last being seen in our clinic, the patient reports doing very well.  Her palpitations have resolved.  Her exercise tolerance is the best its been in a while.  Today, she denies symptoms of  chest pain, shortness of breath,  lower extremity edema, dizziness, presyncope, or syncope.  The patient is otherwise without complaint today.   Past Medical History  Diagnosis Date  . Rheumatic heart disease 1995    post percutaneous valvuloplasty at [facility] with subsequent mechanical mitral valve replacement and tricuspid annuloplasty at [facility]  . Subdural hematoma 2009    on coumadin  . Heparin-induced thrombocytopenia   . HTN (hypertension)   . History of Hashimoto thyroiditis   . HLD (hyperlipidemia)   . Atrial flutter     atypical right atrial flutter s/p ablation at [facility] by Dr Smith Robert  . Other activity     Torsades with amiodarone  . Pacemaker   . Antiphospholipid antibody syndrome   . Anemia   . Heart murmur   . CHF (congestive heart failure)   . Hypothyroidism   . History of blood transfusion     "lots; most in 2007 w/MVR OR"  . Cerebrovascular accident ?1990; 1993; 2007    residual "can't use my right hand; left visual field cut" (03/28/2012)  . Arthritis     "may have some" (03/28/2012)  . Chronic kidney disease     "as a result of my heart failing" (03/28/2012)  . Fine motor impairment     "fingers right hand" (03/28/2012)   Past Surgical History  Procedure Laterality Date  . Mitral valve replacement  2007    with tricuspid annuloplasty  . Amputation      left atrial appendage amputated with MVR  . Craniotomy  2008    for SDH  . Cholecystectomy    . Tonsillectomy    . Percutaneous balloon valvuloplasty  1995    At [facility] with subsequent mechanical mitral valve replacement and tricuspid  annuloplasty at [facility]  . Atrial ablation surgery      CTI and R atriostomy scar flutter ablation at [facility] 8/12,  repeat  ablation at [facility]  9/12  . Cardiac valve replacement    . Tonsillectomy      "when I was a child"  . Total abdominal hysterectomy  ?1995    TAH; BSO  . Biventricular pacemaker placement  03/28/2012    SJM Anthem implanted by Dr Ladona Ridgel  . Cardioversion  2010-2011    "3 at [facility]" (03/28/2012)  . Cardiac electrophysiology mapping and ablation  2012    "2 @ [facility]" (03/28/2012    Current Outpatient Prescriptions  Medication Sig Dispense Refill  . acetaminophen (TYLENOL) 650 MG CR tablet Take 650 mg by mouth at bedtime.      . AMOXICILLIN PO Take 4 capsules by mouth as directed. ONLY WHEN VISITING DENTIST      . Calcium-Magnesium (CAL-MAG PO) Take 1 tablet by mouth daily.       . Cholecalciferol (VITAMIN D) 2000 UNITS tablet Take 2,000 Units by mouth daily.        Marland Kitchen diltiazem (CARDIZEM CD) 180 MG 24 hr capsule Take 2 by mouth daily  270 capsule  3  . Estradiol Acetate (FEMRING) 0.05 MG/24HR RING Place 1 each vaginally as directed.       Marland Kitchen  fish oil-omega-3 fatty acids 1000 MG capsule Take 1 g by mouth daily.       . fluticasone (FLONASE) 50 MCG/ACT nasal spray Place 2 sprays into the nose daily.      . fondaparinux (ARIXTRA) 2.5 MG/0.5ML SOLN Inject 0.5 mLs (2.5 mg total) into the skin every other day.  3.5 mL  6  . levothyroxine (SYNTHROID, LEVOTHROID) 75 MCG tablet Take 75 mcg by mouth daily.        Marland Kitchen lisinopril (PRINIVIL,ZESTRIL) 5 MG tablet Take 2.5 mg by mouth 2 (two) times daily.        No current facility-administered medications for this visit.    Physical Exam: Filed Vitals:   10/07/12 1105  BP: 106/68  Pulse: 80  Height: 5\' 3"  (1.6 m)  Weight: 162 lb 3.2 oz (73.573 kg)    GEN- The patient is well appearing, alert and oriented x 3 today.   Head- normocephalic, atraumatic Eyes-  Sclera clear, conjunctiva pink Ears- hearing intact Oropharynx-  clear Lungs- Clear to ausculation bilaterally, normal work of breathing Chest- pacemaker pocket is well healed Heart- Regular rate and rhythm (paced), mechanical S1 is crisp GI- soft, NT, ND, + BS Extremities- no clubbing, cyanosis, or edema  Pacemaker interrogation- reviewed in detail today,  See PACEART report  Assessment and Plan:  1. Tach/brady Normal BiV pacemaker function ekg today reveals afib with BiV pacing  See Pace Art report No changes today  2. afib Continue long term rate control and anticoagulation  3. Valvular heart disease Stable No change required today Echo reviewed with the patient today.  EF 45-50%.

## 2012-10-08 LAB — PACEMAKER DEVICE OBSERVATION
AL AMPLITUDE: 0.7 mv
ATRIAL PACING PM: 1
DEVICE MODEL PM: 2786353
LV LEAD THRESHOLD: 0.75 V
RV LEAD AMPLITUDE: 8.1 mv
RV LEAD IMPEDENCE PM: 412.5 Ohm
RV LEAD THRESHOLD: 0.75 V

## 2012-10-28 ENCOUNTER — Ambulatory Visit: Payer: Medicare Other | Admitting: Oncology

## 2012-11-04 ENCOUNTER — Telehealth: Payer: Self-pay | Admitting: Oncology

## 2012-11-04 ENCOUNTER — Other Ambulatory Visit (HOSPITAL_BASED_OUTPATIENT_CLINIC_OR_DEPARTMENT_OTHER): Payer: Medicare Other | Admitting: Lab

## 2012-11-04 ENCOUNTER — Ambulatory Visit (HOSPITAL_BASED_OUTPATIENT_CLINIC_OR_DEPARTMENT_OTHER): Payer: Medicare Other | Admitting: Oncology

## 2012-11-04 VITALS — BP 113/75 | HR 88 | Temp 97.1°F | Resp 18 | Ht 63.0 in | Wt 162.4 lb

## 2012-11-04 DIAGNOSIS — D696 Thrombocytopenia, unspecified: Secondary | ICD-10-CM

## 2012-11-04 DIAGNOSIS — D6859 Other primary thrombophilia: Secondary | ICD-10-CM

## 2012-11-04 DIAGNOSIS — Z7901 Long term (current) use of anticoagulants: Secondary | ICD-10-CM

## 2012-11-04 DIAGNOSIS — N289 Disorder of kidney and ureter, unspecified: Secondary | ICD-10-CM

## 2012-11-04 DIAGNOSIS — D689 Coagulation defect, unspecified: Secondary | ICD-10-CM | POA: Diagnosis not present

## 2012-11-04 DIAGNOSIS — D6861 Antiphospholipid syndrome: Secondary | ICD-10-CM

## 2012-11-04 DIAGNOSIS — I4891 Unspecified atrial fibrillation: Secondary | ICD-10-CM

## 2012-11-04 DIAGNOSIS — N189 Chronic kidney disease, unspecified: Secondary | ICD-10-CM

## 2012-11-04 LAB — CBC WITH DIFFERENTIAL/PLATELET
BASO%: 1.3 % (ref 0.0–2.0)
EOS%: 3.6 % (ref 0.0–7.0)
LYMPH%: 19.2 % (ref 14.0–49.7)
MCH: 31.1 pg (ref 25.1–34.0)
MCHC: 33.4 g/dL (ref 31.5–36.0)
MCV: 93.1 fL (ref 79.5–101.0)
MONO%: 6.3 % (ref 0.0–14.0)
NEUT%: 69.6 % (ref 38.4–76.8)
Platelets: 94 10*3/uL — ABNORMAL LOW (ref 145–400)
RBC: 3.27 10*6/uL — ABNORMAL LOW (ref 3.70–5.45)
WBC: 4.6 10*3/uL (ref 3.9–10.3)

## 2012-11-04 LAB — COMPREHENSIVE METABOLIC PANEL (CC13)
ALT: 17 U/L (ref 0–55)
Alkaline Phosphatase: 81 U/L (ref 40–150)
CO2: 23 mEq/L (ref 22–29)
Creatinine: 1.7 mg/dL — ABNORMAL HIGH (ref 0.6–1.1)
Glucose: 84 mg/dl (ref 70–99)
Sodium: 139 mEq/L (ref 136–145)
Total Bilirubin: 0.47 mg/dL (ref 0.20–1.20)

## 2012-11-04 LAB — HEPARIN ANTI-XA: Heparin LMW: 0.89 IU/mL

## 2012-11-04 NOTE — Progress Notes (Signed)
Hematology and Oncology Follow Up Visit  [name] [id number] [birth year] 67 y.o. 11/04/2012 6:21 PM   Principle Diagnosis: Encounter Diagnoses  Name Primary?  . THROMBOCYTOPENIA, CHRONIC   . CHRONIC KIDNEY DISEASE UNSPECIFIED   . Antiphospholipid antibody with hypercoagulable state Yes  . 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 She has had  recurrent thrombotic events including an episode of amaurosis fugax and a TIA in the past. She has chronic moderate thrombocytopenia. She developed advanced mitral valve disease and had to undergo an emergency mitral valve replacement when she went into acute congestive heart failure with a concomitant DIC picture. She had a mechanical mitral valve replacement. 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 She developed chronic renal insufficiency. She suffered a subdural hematoma requiring emergency evacuation while on Coumadin. Attempts to go back on Coumadin resulted in a recurrent subdural. She had an anaphylactic reaction to vitamin K in attempt to reverse the Coumadin at time of the second intracranial bleed. 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 She has been maintained on Arixtra injections 2.5 mg every other day in view of her chronic renal dysfunction.  Despite the mitral valve replacement, she has had refractory atrial arrhythmias. She has required DC cardioversion and a number of attempts at radiofrequency ablation. Most recently she had to have a pacemaker placed. Her cardiologist told her that she is still in atrial fibrillation.   Despite this complicated history, she is doing remarkably well at this time. She is exercising on a regular basis. She is not  having any chest pain or dyspnea. No palpitations.  Medications: reviewed  Allergies:  Allergies  Allergen Reactions  . Amiodarone Hcl Other (See Comments)    "torsades; v tach"  . 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 Vitamin K Anaphylaxis    IV only allergy  . Iodinated Diagnostic Agents Itching    "over 35 years ago" (03/28/2012)    Review of Systems: Constitutional:   No constitutional symptoms Respiratory: No cough or dyspnea Cardiovascular:  No chest pain or palpitations Gastrointestinal: No change in bowel habit Genito-Urinary: She is status post hysterectomy. No vaginal bleeding. Musculoskeletal: No muscle bone or joint pain Neurologic: Residual neurologic deficits of fine motor coordination and fine movements of her left hand Skin: No rash or ecchymosis Remaining ROS negative.  Physical Exam: Blood pressure 113/75, pulse 88, temperature 97.1 F (36.2 C), temperature source Oral, resp. rate 18, height 5\' 3"  (1.6 m), weight 162 lb 6.4 oz (73.664 kg). Wt Readings from Last 3 Encounters:  11/04/12 162 lb 6.4 oz (73.664 kg)  10/07/12 162 lb 3.2 oz (73.573 kg)  07/12/12 161 lb (73.029 kg)     General appearance: Well-nourished Caucasian woman HENNT: Pharynx no erythema or exudate  Lymph nodes: No adenopathy Breasts: Lungs: Clear to auscultation resonant to percussion Heart: Regular cardiac rhythm. Mechanical heart valve sound. Soft one over two systolic murmur left sternal border. Abdomen: Soft, nontender, no mass, no organomegaly Extremities: No edema, no calf tenderness Musculoskeletal: No joint deformities GU: Vascular: No cyanosis Neurologic: Mental status intact, PERRLA, optic disc chronically blurred margins, motor strength is 5 over 5, coordination on finger to finger and rapid alternating movements left hand. Reflexes 1+ symmetric. Skin: No rash or ecchymosis  Lab  Results: Lab Results  Component Value Date   WBC 4.6 11/04/2012   HGB 10.2* 11/04/2012    HCT 30.4* 11/04/2012   MCV 93.1 11/04/2012   PLT 94* 11/04/2012     Chemistry      Component Value Date/Time   NA 139 11/04/2012 1158   NA 140 04/08/2012 1102   K 4.6 11/04/2012 1158   K 4.6 04/08/2012 1102   CL 108* 11/04/2012 1158   CL 108 04/08/2012 1102   CO2 23 11/04/2012 1158   CO2 24 04/08/2012 1102   BUN 35.8* 11/04/2012 1158   BUN 46* 04/08/2012 1102   CREATININE 1.7* 11/04/2012 1158   CREATININE 1.7* 04/08/2012 1102      Component Value Date/Time   CALCIUM 8.7 11/04/2012 1158   CALCIUM 8.3* 04/08/2012 1102   ALKPHOS 81 11/04/2012 1158   ALKPHOS 61 02/05/2012 1042   AST 19 11/04/2012 1158   AST 24 02/05/2012 1042   ALT 17 11/04/2012 1158   ALT 18 02/05/2012 1042   BILITOT 0.47 11/04/2012 1158   BILITOT 0.5 02/05/2012 1042       Radiological Studies: No results found.  Impression: #1. Antiphospholipid antibody syndrome status post multiple thrombotic events She remains stable on Arixtra anticoagulation.  Plan: Continue long-term anticoagulation.  We discussed the potential use of one of the new oral anticoagulants. The safest one would be apixiban since it has not cleared in the kidney. Whether there are any advantages to using this drug as opposed to Arixtra and somebody with a mechanical heart valve replacement is unclear. Patient is comfortable with the Arixtra and perfectly willing to continue it.  #2. Chronic mild thrombocytopenia secondary to #1   #3. Chronic renal insufficiency likely due to perioperative complications at time of mitral valve replacement in 2007.   #4. Mitral valve disease likely secondary to #1.   #5. Atrial fibrillation/flutter-see discussion above now status post pacemaker implantation.  #6. Essential hypertension   #7. Hypothyroid on replacement    [place] copy: Dr. Hillis Range; Dr. Dorcas Carrow; Dr. Camille Bal; Dr. Marden Noble      Levert Feinstein, MD 5/12/20146:21 PM

## 2012-11-06 ENCOUNTER — Telehealth: Payer: Self-pay | Admitting: *Deleted

## 2012-11-06 NOTE — Telephone Encounter (Signed)
Message copied by Orbie Hurst on Wed Nov 06, 2012 12:25 PM ------      Message from: Levert Feinstein      Created: Tue Nov 05, 2012 12:05 PM       Call Dr Carey Bullocks, arixtra level acceptable/stable.  Creatinine 1.7 stable ------

## 2012-11-06 NOTE — Telephone Encounter (Signed)
No phone number listed for patient and note in chart that patient has My Chart and we do not have to call her with lab results

## 2012-12-12 ENCOUNTER — Encounter: Payer: Self-pay | Admitting: Neurology

## 2012-12-12 DIAGNOSIS — E039 Hypothyroidism, unspecified: Secondary | ICD-10-CM | POA: Insufficient documentation

## 2012-12-12 DIAGNOSIS — I272 Pulmonary hypertension, unspecified: Secondary | ICD-10-CM

## 2012-12-12 DIAGNOSIS — D6859 Other primary thrombophilia: Secondary | ICD-10-CM

## 2012-12-17 ENCOUNTER — Telehealth: Payer: Self-pay | Admitting: Oncology

## 2012-12-17 NOTE — Telephone Encounter (Signed)
Pt came by ande changed la appt from 7/14 to 7/17

## 2012-12-26 DIAGNOSIS — D692 Other nonthrombocytopenic purpura: Secondary | ICD-10-CM | POA: Diagnosis not present

## 2012-12-26 DIAGNOSIS — D239 Other benign neoplasm of skin, unspecified: Secondary | ICD-10-CM | POA: Diagnosis not present

## 2012-12-26 DIAGNOSIS — L570 Actinic keratosis: Secondary | ICD-10-CM | POA: Diagnosis not present

## 2012-12-26 DIAGNOSIS — L821 Other seborrheic keratosis: Secondary | ICD-10-CM | POA: Diagnosis not present

## 2012-12-26 DIAGNOSIS — L820 Inflamed seborrheic keratosis: Secondary | ICD-10-CM | POA: Diagnosis not present

## 2012-12-26 DIAGNOSIS — B372 Candidiasis of skin and nail: Secondary | ICD-10-CM | POA: Diagnosis not present

## 2012-12-26 DIAGNOSIS — Z85828 Personal history of other malignant neoplasm of skin: Secondary | ICD-10-CM | POA: Diagnosis not present

## 2012-12-26 DIAGNOSIS — D1801 Hemangioma of skin and subcutaneous tissue: Secondary | ICD-10-CM | POA: Diagnosis not present

## 2013-01-01 ENCOUNTER — Encounter: Payer: Self-pay | Admitting: Cardiology

## 2013-01-06 ENCOUNTER — Other Ambulatory Visit: Payer: Medicare Other

## 2013-01-07 ENCOUNTER — Telehealth: Payer: Self-pay | Admitting: *Deleted

## 2013-01-07 NOTE — Telephone Encounter (Signed)
Spoke w/pt in regards to [employer] and also appointment with Dr Jens Som. Appointment was given for 01-20-13 @ 1530. Pt aware of this appountment. New transmitter was ordered per Bruce at [facility]. Transmitter was ordered in October but was sent to PO box. Address changed in  [place]. Per Bruce transmitter should be received in next 7 days.

## 2013-01-09 ENCOUNTER — Other Ambulatory Visit (HOSPITAL_BASED_OUTPATIENT_CLINIC_OR_DEPARTMENT_OTHER): Payer: Medicare Other | Admitting: Lab

## 2013-01-09 DIAGNOSIS — I4891 Unspecified atrial fibrillation: Secondary | ICD-10-CM | POA: Diagnosis not present

## 2013-01-09 DIAGNOSIS — D696 Thrombocytopenia, unspecified: Secondary | ICD-10-CM

## 2013-01-09 DIAGNOSIS — Z7901 Long term (current) use of anticoagulants: Secondary | ICD-10-CM | POA: Diagnosis not present

## 2013-01-09 DIAGNOSIS — N189 Chronic kidney disease, unspecified: Secondary | ICD-10-CM | POA: Diagnosis not present

## 2013-01-09 DIAGNOSIS — D6859 Other primary thrombophilia: Secondary | ICD-10-CM | POA: Diagnosis not present

## 2013-01-09 DIAGNOSIS — D6861 Antiphospholipid syndrome: Secondary | ICD-10-CM

## 2013-01-09 LAB — COMPREHENSIVE METABOLIC PANEL (CC13)
ALT: 16 U/L (ref 0–55)
BUN: 32.4 mg/dL — ABNORMAL HIGH (ref 7.0–26.0)
CO2: 25 mEq/L (ref 22–29)
Calcium: 8.8 mg/dL (ref 8.4–10.4)
Chloride: 105 mEq/L (ref 98–109)
Creatinine: 2 mg/dL — ABNORMAL HIGH (ref 0.6–1.1)
Glucose: 73 mg/dl (ref 70–140)

## 2013-01-09 LAB — CBC WITH DIFFERENTIAL/PLATELET
Eosinophils Absolute: 0.2 10*3/uL (ref 0.0–0.5)
HCT: 32.2 % — ABNORMAL LOW (ref 34.8–46.6)
LYMPH%: 18 % (ref 14.0–49.7)
MCV: 93.5 fL (ref 79.5–101.0)
MONO#: 0.3 10*3/uL (ref 0.1–0.9)
MONO%: 7 % (ref 0.0–14.0)
NEUT#: 3.4 10*3/uL (ref 1.5–6.5)
NEUT%: 70.5 % (ref 38.4–76.8)
Platelets: 101 10*3/uL — ABNORMAL LOW (ref 145–400)
WBC: 4.8 10*3/uL (ref 3.9–10.3)

## 2013-01-13 ENCOUNTER — Encounter: Payer: Self-pay | Admitting: Neurology

## 2013-01-13 ENCOUNTER — Ambulatory Visit (INDEPENDENT_AMBULATORY_CARE_PROVIDER_SITE_OTHER): Payer: Medicare Other | Admitting: Neurology

## 2013-01-13 ENCOUNTER — Telehealth: Payer: Self-pay | Admitting: *Deleted

## 2013-01-13 VITALS — BP 118/78 | HR 82 | Ht 63.0 in | Wt 161.0 lb

## 2013-01-13 DIAGNOSIS — G8110 Spastic hemiplegia affecting unspecified side: Secondary | ICD-10-CM | POA: Diagnosis not present

## 2013-01-13 NOTE — Telephone Encounter (Signed)
Spoke with patient.  Let her know that arixtra level is stable.  K is running a little high @ 5.2,  Dr. Reece Agar will forward results to Dr. Eliott Nine.  She appreciated the phone call.

## 2013-01-13 NOTE — Progress Notes (Signed)
[facility] [address] [place]. [place] [address] [phone]       OFFICE FOLLOW-UP NOTE  Ms. NAKEIA CALVI Date of Birth:  May 13, 1946 Medical Record Number:  [id number]   HPI: 37 year Caucasian lady with antiphospholipid antibody syndrome with right brain stroke 1997 with residual left eye peripheral visual loss, right brain stroke 2007  S/p mitral valve replacementwith left hemiparesis,h/o left brain stroke with aphasia who has done well on arixtra x 2007 following traumatic subdural hematoma on warfarin and aspirin requiring evacuation.remote h/o focal seizures with left hand movements now off anticonvulsants for several years. H/o heparin induced thrombocytopenia. And Atrial Fibrillation 01/13/13 She is seen today after last visit with Dr Sandria Manly 07/15/2012.She is doing well without any recurrent neurovascular symptoms since 2008. She has been on arixtra injections every other day and tolerating well without bleeding or bruising.She is independent with activities of daily living and walks without assistance.She has weaknesss of left hand and involuntary spasm of left hand limiting use .She has not considered botox for her hand and seems interested and will refer her to Dr Vaughan Basta for EMG guided botox.She had traumatic subdural on warfarin and aspirin and heparin induced thrombocytopenia hence was switched to arixtra on recommendations of hematologist at [facility] in 2008 and locally sees Dr Cyndie Chime regularly.She has not had any neurovascular symptoms fortunately despite having atrial fibrillation, mechanical heart valve and antiphospholipid antibody syndrome.  ROS:   14 system review of systems is positive for urge incontinence,feeling cold,easy bruising  PMH:  Past Medical History  Diagnosis Date  . Rheumatic heart disease 1995    post percutaneous valvuloplasty at [facility] with subsequent mechanical mitral valve replacement and tricuspid annuloplasty at  [facility]  . Subdural hematoma 2009    on coumadin  . Heparin-induced thrombocytopenia   . HTN (hypertension)   . History of Hashimoto thyroiditis   . HLD (hyperlipidemia)   . Atrial flutter     atypical right atrial flutter s/p ablation at [facility] by Dr Smith Robert  . 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 Pacemaker   . Antiphospholipid antibody syndrome   . Anemia   . Heart murmur   . CHF (congestive heart failure)   . Hypothyroidism   . History of blood transfusion     "lots; most in 2007 w/MVR OR"  . Cerebrovascular accident ?1990; 1993; 2007    residual "can't use my right hand; left visual field cut" (03/28/2012)  . Arthritis     "may have some" (03/28/2012)  . Chronic kidney disease     "as a result of my heart failing" (03/28/2012)  . Fine motor impairment     "fingers right hand" (03/28/2012)    Social History:  History   Social History  . Marital Status: Married    Spouse Name: N/A    Number of Children: N/A  . Years of Education: N/A   Occupational History  . PHYSICIAN     retired   Social History Main Topics  . Smoking status: Former Smoker -- 0.10 packs/day for 6 years    Types: Cigarettes  . Smokeless tobacco: Never Used     Comment: 03/28/2012 "smoked in the 1970's"  . Alcohol Use: 0.0 oz/week     Comment: 03/28/2012 "very rare glass of wine"  . Drug Use: No  . Sexually Active: Yes   Other Topics Concern  . 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 Lives at home with her husband.  Quit smoking 25 years ago and quit alcohol 2 years ago.   Denies alcohol, caffeine,  and illicit drug use.      Medications:   Current Outpatient Prescriptions on File Prior to Visit  Medication Sig Dispense Refill  . acetaminophen (TYLENOL) 650 MG CR tablet Take 650 mg by mouth at bedtime.      . AMOXICILLIN PO Take 4 capsules by mouth as directed. ONLY WHEN VISITING DENTIST      .  Calcium-Magnesium (CAL-MAG PO) Take 1 tablet by mouth daily.       . Cholecalciferol (VITAMIN D) 2000 UNITS tablet Take 2,000 Units by mouth daily.        Marland Kitchen diltiazem (CARDIZEM CD) 180 MG 24 hr capsule Take 2 by mouth daily  270 capsule  3  . Estradiol Acetate (FEMRING) 0.05 MG/24HR RING Place 1 each vaginally as directed.       . fish oil-omega-3 fatty acids 1000 MG capsule Take 1 g by mouth daily.       . fluticasone (FLONASE) 50 MCG/ACT nasal spray Place 2 sprays into the nose daily.      . fondaparinux (ARIXTRA) 2.5 MG/0.5ML SOLN Inject 0.5 mLs (2.5 mg total) into the skin every other day.  3.5 mL  6  . levothyroxine (SYNTHROID, LEVOTHROID) 75 MCG tablet Take 75 mcg by mouth daily.        Marland Kitchen lisinopril (PRINIVIL,ZESTRIL) 5 MG tablet Take 2.5 mg by mouth 2 (two) times daily.       . valACYclovir (VALTREX) 1000 MG tablet Take 1,000 mg by mouth 2 (two) times daily. For a total of 3 doses for labial herpes prn only       No current facility-administered medications on file prior to visit.    Allergies:   Allergies  Allergen Reactions  . Amiodarone Hcl Other (See Comments)    "torsades; v tach"  . 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 Vitamin K Anaphylaxis    IV only allergy  . Iodinated Diagnostic Agents Itching    "over 35 years ago" (03/28/2012)   Filed Vitals:   01/13/13 1353  BP: 118/78  Pulse: 82    Physical Exam General: well developed, well nourished, seated, in no evident distress Head: head normocephalic and atraumatic. Orohparynx benign Neck: supple with no carotid or supraclavicular bruits Cardiovascular: regular rate and rhythm, no murmurs Musculoskeletal: no deformity Skin:  no rash/petichiae Vascular:  Normal pulses all extremities  Neurologic Exam Mental Status: Awake and fully alert. Oriented to place and time. Recent and remote memory intact. Attention span, concentration and fund of knowledge appropriate. Mood and affect appropriate.  Cranial Nerves:  Fundoscopic exam reveals sharp disc margins. Pupils equal, briskly reactive to light. Extraocular movements full without nystagmus. Visual fields show partial inferior left homonymous hemianopsia to confrontation. Hearing intact. Facial sensation intact. Face, tongue, palate moves normally and symmetrically. Mild left lower face weakness. Motor: spastic left hemiparesis 4/5 strength with mild weakness left hand, grip and ankle dorsiflexors.weak intrinsic hand muscles and increase tone intermittent flexion deformity of left hand fingers  Sensory.: diminished touch and pinprick and vibratory. Sensation in left hand  Coordination: Rapid alternating movements normal in all extremities. Finger-to-nose and heel-to-shin performed accurately bilaterally. Gait and Station: Arises from chair without difficulty. Spastic hemiparetic gait with mild left foot drop wearing a ankle brace Reflexes: 2+ and asymmetric brisk on  left. Toes downgoing.     ASSESSMENT: 53 year Caucasian lady with antiphospholipid antibody syndrome with right brain stroke 1997 with residual left eye peripheral visual loss, right brain stroke 2007  S/p mitral valve replacementwith left hemiparesis,h/o left brain stroke with aphasia who has done well on arixtra x 2007 following traumatic subdural hematoma on warfarin and aspirin requiring evacuation.remote h/o focal seizures with left hand movements now off anticonvulsants for several years. H/o heparin induced thrombocytopenia.    PLAN:  Continue arixtra for anticoagulation. May consider switching back to warfarin incase she has recurrent stroke/TIAs in future. Refer to Dr Victorio Palm for botox left hand for spasticity. No f/u needed with me as last stroke  6 years ago.

## 2013-01-13 NOTE — Patient Instructions (Addendum)
Continue arixtra for anticoagulation. May consider switching back to warfarin incase she has recurrent stroke/TIas in future. Refer to Dr Victorio Palm for botox left hand for spasticity. No f/u needed with me as last stroke  6 years ago.

## 2013-01-13 NOTE — Telephone Encounter (Signed)
Message copied by Orbie Hurst on Mon Jan 13, 2013  4:43 PM ------      Message from: Levert Feinstein      Created: Sun Jan 12, 2013  1:30 PM       Call Dr Carey Bullocks, arixtra level stable;  K running a little high @ 5.2  I will forward results to Dr Eliott Nine ------

## 2013-01-16 ENCOUNTER — Ambulatory Visit: Payer: Medicare Other | Admitting: Cardiology

## 2013-01-20 ENCOUNTER — Ambulatory Visit (INDEPENDENT_AMBULATORY_CARE_PROVIDER_SITE_OTHER): Payer: Medicare Other | Admitting: Cardiology

## 2013-01-20 ENCOUNTER — Encounter: Payer: Self-pay | Admitting: Cardiology

## 2013-01-20 VITALS — BP 94/64 | HR 106 | Wt 157.0 lb

## 2013-01-20 DIAGNOSIS — Z950 Presence of cardiac pacemaker: Secondary | ICD-10-CM

## 2013-01-20 DIAGNOSIS — I5042 Chronic combined systolic (congestive) and diastolic (congestive) heart failure: Secondary | ICD-10-CM

## 2013-01-20 DIAGNOSIS — Z9889 Other specified postprocedural states: Secondary | ICD-10-CM

## 2013-01-20 DIAGNOSIS — I4891 Unspecified atrial fibrillation: Secondary | ICD-10-CM

## 2013-01-20 DIAGNOSIS — I10 Essential (primary) hypertension: Secondary | ICD-10-CM

## 2013-01-20 NOTE — Progress Notes (Signed)
HPI: Pleasant female for fu of rheumatic heart disease, atrial tachycardia/flutter, diastolic congestive heart failure. The patient had mitral valvuloplasty at [facility] in the late 90s. In 2007 she had a mechanical mitral valve replacement at [facility]. 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 She's had a prior subdural hematoma that required evacuation. She also has a history of heparin-induced thrombocytopenia. She is followed by Dr Johney Frame for SVT and is s/p ablation at [facility]; also with h/o torsades with amiodarone. Has had previous biventricular pacemaker. Last echocardiogram in April of 2014 showed an ejection fraction of 45-50%, normal appearing mechanical mitral valve with trace mitral regurgitation, moderate left atrial enlargement and mild right atrial enlargement. There was trace aortic insufficiency. Since she was last seen she is doing well with no dyspnea, chest pain, palpitations or syncope. No bleeding.   Current Outpatient Prescriptions  Medication Sig Dispense Refill  . acetaminophen (TYLENOL) 650 MG CR tablet Take 650 mg by mouth at bedtime.      . AMOXICILLIN PO Take 4 capsules by mouth as directed. ONLY WHEN VISITING DENTIST      . Calcium-Magnesium (CAL-MAG PO) Take 1 tablet by mouth daily.       . Cholecalciferol (VITAMIN D) 2000 UNITS tablet Take 2,000 Units by mouth daily.        Marland Kitchen diltiazem (CARDIZEM CD) 180 MG 24 hr capsule Take 2 by mouth daily  270 capsule  3  . Estradiol Acetate (FEMRING) 0.05 MG/24HR RING Place 1 each vaginally as directed.       . fish oil-omega-3 fatty acids 1000 MG capsule Take 1 g by mouth daily.       . fluticasone (FLONASE) 50 MCG/ACT nasal spray Place 2 sprays into the nose daily.      . fondaparinux (ARIXTRA) 2.5 MG/0.5ML SOLN Inject 0.5 mLs (2.5 mg total) into the skin every other day.  3.5 mL  6  . levothyroxine (SYNTHROID,  LEVOTHROID) 75 MCG tablet Take 75 mcg by mouth daily.        Marland Kitchen lisinopril (PRINIVIL,ZESTRIL) 5 MG tablet Take 2.5 mg by mouth 2 (two) times daily.       . valACYclovir (VALTREX) 1000 MG tablet Take 1,000 mg by mouth 2 (two) times daily. For a total of 3 doses for labial herpes prn only       No current facility-administered medications for this visit.     Past Medical History  Diagnosis Date  . Rheumatic heart disease 1995    post percutaneous valvuloplasty at [facility] with subsequent mechanical mitral valve replacement and tricuspid annuloplasty at [facility]  . Subdural hematoma 2009    on coumadin  . Heparin-induced thrombocytopenia   . HTN (hypertension)   . History of Hashimoto thyroiditis   . HLD (hyperlipidemia)   . Atrial flutter     atypical right atrial flutter s/p ablation at [facility] by Dr Smith Robert  . 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 Pacemaker   . Antiphospholipid antibody syndrome   . Anemia   . Heart murmur   . CHF (congestive heart failure)   . Hypothyroidism   . History of blood transfusion     "lots; most in 2007 w/MVR OR"  . Cerebrovascular accident ?1990; 1993; 2007    residual "can't use my right hand; left visual field cut" (03/28/2012)  . Arthritis     "  may have some" (03/28/2012)  . Chronic kidney disease     "as a result of my heart failing" (03/28/2012)  . Fine motor impairment     "fingers right hand" (03/28/2012)    Past Surgical History  Procedure Laterality Date  . Mitral valve replacement  2007    with tricuspid annuloplasty  . Amputation      left atrial appendage amputated with MVR  . Craniotomy  2008    for SDH  . Cholecystectomy    . Tonsillectomy    . Percutaneous balloon valvuloplasty  1995    At [facility] with subsequent mechanical mitral valve replacement and tricuspid annuloplasty at [facility]  . Atrial ablation surgery      CTI and R atriostomy scar flutter ablation at [facility] 8/12,  repeat   ablation at [facility]  9/12  . Cardiac valve replacement    . Tonsillectomy      "when I was a child"  . Total abdominal hysterectomy  ?1995    TAH; BSO  . Biventricular pacemaker placement  03/28/2012    SJM Anthem implanted by Dr Ladona Ridgel  . Cardioversion  2010-2011    "3 at [facility]" (03/28/2012)  . Cardiac electrophysiology mapping and ablation  2012    "2 @ [facility]" (03/28/2012    History   Social History  . Marital Status: Married    Spouse Name: N/A    Number of Children: N/A  . Years of Education: N/A   Occupational History  . PHYSICIAN     retired   Social History Main Topics  . Smoking status: Former Smoker -- 0.10 packs/day for 6 years    Types: Cigarettes  . Smokeless tobacco: Never Used     Comment: 03/28/2012 "smoked in the 1970's"  . Alcohol Use: 0.0 oz/week     Comment: 03/28/2012 "very rare glass of wine"  . Drug Use: No  . Sexually Active: Yes   Other Topics Concern  . 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  Quit smoking 25 years ago and quit alcohol 2 years ago.   Denies alcohol, caffeine,  and illicit drug use.      ROS: no fevers or chills, productive cough, hemoptysis, dysphasia, odynophagia, melena, hematochezia, dysuria, hematuria, rash, seizure activity, orthopnea, PND, pedal edema, claudication. Remaining systems are negative.  Physical Exam: Well-developed well-nourished in no acute distress.  Skin is warm and dry.  HEENT is normal.  Neck is supple.  Chest is clear to auscultation with normal expansion.  Cardiovascular exam is regular rate and rhythm. Crisp mechanical valve sound Abdominal exam nontender or distended. No masses palpated. Extremities show no edema. neuro grossly intact

## 2013-01-20 NOTE — Assessment & Plan Note (Signed)
Patient is euvolemic on examination. She has not required Lasix in quite some time.

## 2013-01-20 NOTE — Assessment & Plan Note (Signed)
Blood pressure controlled.continue present medications. 

## 2013-01-20 NOTE — Assessment & Plan Note (Signed)
Continue anticoagulation. Continue Cardizem. Plan is rate control and anticoagulation.

## 2013-01-20 NOTE — Assessment & Plan Note (Signed)
Continue SBE prophylaxis. 

## 2013-01-20 NOTE — Assessment & Plan Note (Signed)
Management per electrophysiology. 

## 2013-01-20 NOTE — Patient Instructions (Addendum)
Your physician wants you to follow-up in: ONE YEAR WITH DR CRENSHAW You will receive a reminder letter in the mail two months in advance. If you don't receive a letter, please call our office to schedule the follow-up appointment.  

## 2013-01-29 ENCOUNTER — Other Ambulatory Visit: Payer: Self-pay

## 2013-02-13 ENCOUNTER — Encounter: Payer: Self-pay | Admitting: Neurology

## 2013-02-13 ENCOUNTER — Ambulatory Visit (INDEPENDENT_AMBULATORY_CARE_PROVIDER_SITE_OTHER): Payer: Medicare Other | Admitting: Neurology

## 2013-02-13 VITALS — BP 105/64 | HR 60 | Ht 61.0 in | Wt 159.0 lb

## 2013-02-13 DIAGNOSIS — G8110 Spastic hemiplegia affecting unspecified side: Secondary | ICD-10-CM | POA: Diagnosis not present

## 2013-02-13 MED ORDER — ONABOTULINUMTOXINA 100 UNITS IJ SOLR
10.0000 [IU] | Freq: Once | INTRAMUSCULAR | Status: AC
  Administered 2013-02-13: 10 [IU] via INTRAMUSCULAR

## 2013-02-13 MED ORDER — ONABOTULINUMTOXINA 100 UNITS IJ SOLR: 40.0000 [IU] | Freq: Once | INTRAMUSCULAR | Status: DC

## 2013-02-13 NOTE — Addendum Note (Signed)
Addended by: Ramond Marrow on: 02/13/2013 02:43 PM   Modules accepted: Orders

## 2013-02-13 NOTE — Progress Notes (Addendum)
Provider:  Dr Hosie Poisson Referring Provider: Marden Noble, MD Primary Care Physician:  [name], MD  CC:  Post stroke spasticity  HPI:  [name] is a 67 y.o. female here for initial Botox injections for post stroke spasticity  Exam:  Contraindications and precautions discussed with patient. EMG guidance was used to inject muscles detailed below. Aseptic procedure was observed and patient tolerated procedure. Procedure performed by Dr. Elspeth Cho.  The condition has existed for more than 6 months, and pt does not have a diagnosis of ALS, Myasthenia Gravis or Lambert-Eaton Syndrome.  Risks and benefits of injections discussed and pt agrees to proceed with the procedure.  Written consent obtained These injections are medically necessary.  These injections do not cause sedations or hallucinations which the oral therapies may cause.  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 Marital Status: Married    Spouse Name: N/A    Number of Children: N/A  . Years of Education: N/A   Occupational History  . PHYSICIAN     retired   Social History Main Topics  . Smoking status: Former Smoker -- 0.10 packs/day for 6 years    Types: Cigarettes  . Smokeless tobacco: Never Used     Comment: 03/28/2012 "smoked in the 1970's"  . Alcohol Use: 0.0 oz/week     Comment: 03/28/2012 "very rare glass of wine"  . Drug Use: No  . Sexual Activity: Yes   Other Topics Concern  . 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  Quit smoking 25 years ago and quit alcohol 2 years ago.   Denies alcohol, caffeine,  and illicit drug use.      Family History  Problem Relation Age of Onset  . Heart disease Father     late 96's early 80's-CABG  .  Dementia Father   . Diabetes Father     Past Medical History  Diagnosis Date  . Rheumatic heart disease 1995    post percutaneous valvuloplasty at [facility] with subsequent mechanical mitral valve replacement and tricuspid annuloplasty at [facility]  . Subdural hematoma 2009    on coumadin  . Heparin-induced thrombocytopenia   . HTN (hypertension)   . History of Hashimoto thyroiditis   . HLD (hyperlipidemia)   . Atrial flutter     atypical right atrial flutter s/p ablation at [facility] by Dr Smith Robert  . 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 Pacemaker   . Antiphospholipid antibody syndrome   . Anemia   . Heart murmur   . CHF (congestive heart failure)   . Hypothyroidism   . History of blood transfusion     "lots; most in 2007 w/MVR OR"  . Cerebrovascular accident ?1990; 1993; 2007    residual "can't use my right hand; left visual field cut" (03/28/2012)  . Arthritis     "may have some" (03/28/2012)  . Chronic kidney disease     "as a result of my heart failing" (03/28/2012)  . Fine motor impairment     "fingers right hand" (03/28/2012)    Past Surgical History  Procedure Laterality Date  . Mitral valve replacement  2007    with tricuspid annuloplasty  .  Amputation      left atrial appendage amputated with MVR  . Craniotomy  2008    for SDH  . Cholecystectomy    . Tonsillectomy    . Percutaneous balloon valvuloplasty  1995    At [facility] with subsequent mechanical mitral valve replacement and tricuspid annuloplasty at [facility]  . Atrial ablation surgery      CTI and R atriostomy scar flutter ablation at [facility] 8/12,  repeat  ablation at [facility]  9/12  . Cardiac valve replacement    . Tonsillectomy      "when I was a child"  . Total abdominal hysterectomy  ?1995    TAH; BSO  . Biventricular pacemaker placement  03/28/2012    SJM Anthem implanted by Dr Ladona Ridgel  . Cardioversion  2010-2011    "3 at [facility]" (03/28/2012)  . Cardiac electrophysiology  mapping and ablation  2012    "2 @ [facility]" (03/28/2012    Current Outpatient Prescriptions  Medication Sig Dispense Refill  . acetaminophen (TYLENOL) 650 MG CR tablet Take 650 mg by mouth at bedtime.      . AMOXICILLIN PO Take 4 capsules by mouth as directed. ONLY WHEN VISITING DENTIST      . Calcium-Magnesium (CAL-MAG PO) Take 1 tablet by mouth daily.       . Cholecalciferol (VITAMIN D) 2000 UNITS tablet Take 2,000 Units by mouth daily.        Marland Kitchen diltiazem (CARDIZEM CD) 180 MG 24 hr capsule Take 2 by mouth daily  270 capsule  3  . Estradiol Acetate (FEMRING) 0.05 MG/24HR RING Place 1 each vaginally as directed.       . fish oil-omega-3 fatty acids 1000 MG capsule Take 1 g by mouth daily.       . fluticasone (FLONASE) 50 MCG/ACT nasal spray Place 2 sprays into the nose daily.      . fondaparinux (ARIXTRA) 2.5 MG/0.5ML SOLN Inject 0.5 mLs (2.5 mg total) into the skin every other day.  3.5 mL  6  . levothyroxine (SYNTHROID, LEVOTHROID) 75 MCG tablet Take 75 mcg by mouth daily.        Marland Kitchen lisinopril (PRINIVIL,ZESTRIL) 5 MG tablet Take 2.5 mg by mouth 2 (two) times daily.       . valACYclovir (VALTREX) 1000 MG tablet Take 1,000 mg by mouth 2 (two) times daily. For a total of 3 doses for labial herpes prn only       No current facility-administered medications for this visit.    Allergies as of 02/13/2013 - Review Complete 01/20/2013  Allergen Reaction Noted  . Amiodarone hcl Other (See Comments)   . Heparin Other (See Comments)   . Vitamin k Anaphylaxis   . Iodinated diagnostic agents Itching 10/10/2010    Vitals: There were no vitals taken for this visit. Last Weight:  Wt Readings from Last 1 Encounters:  01/20/13 157 lb (71.215 kg)   Last Height:   Ht Readings from Last 1 Encounters:  01/13/13 5\' 3"  (1.6 m)    [name] is a pleasant 67y/o with L sided spastic hemi-paresis secondary to a prior CVA who presents for initial Botox injections.    1) Botox injections as detailed above.  A total of 10 units and 40 units wated used in 2:1 dilution with injections to the L FDS and L FDP 2) Tylenol or Motrin for injection site pain. 3) Medication guide dispensed. 4) Follow up for repeat injections in 3 months -benefits and risks discussed  with patient. consent was obtained. possible side effects were discussed and full package insert was given to patient.

## 2013-02-13 NOTE — Patient Instructions (Addendum)
1) You received your first set of Botox injections today. A total of 20 units was used. 2) You can useTylenol or Motrin for injection site pain. 3) Consider a referral to occupational therapy for hand stretching and strengthening exercises 4) Follow up for repeat injections in 3 months Please call [phone] with any questions or concerns

## 2013-03-04 ENCOUNTER — Telehealth: Payer: Self-pay | Admitting: Internal Medicine

## 2013-03-04 NOTE — Telephone Encounter (Signed)
New Problem  pt states she recieved a device to monitor the pacemaker but doesnt know the next steps//  states she was advised to plug it in and someone would call her from there. has not recieved a call back.

## 2013-03-04 NOTE — Telephone Encounter (Signed)
Spoke w/pt to let know transmitter is working properly. Last transmission was 02-11-13 with alert for AF.

## 2013-03-10 ENCOUNTER — Ambulatory Visit: Payer: Medicare Other | Admitting: Cardiology

## 2013-03-12 ENCOUNTER — Other Ambulatory Visit (HOSPITAL_BASED_OUTPATIENT_CLINIC_OR_DEPARTMENT_OTHER): Payer: Medicare Other | Admitting: Lab

## 2013-03-12 ENCOUNTER — Ambulatory Visit (HOSPITAL_BASED_OUTPATIENT_CLINIC_OR_DEPARTMENT_OTHER): Payer: Medicare Other | Admitting: Oncology

## 2013-03-12 ENCOUNTER — Telehealth: Payer: Self-pay | Admitting: Oncology

## 2013-03-12 VITALS — BP 118/81 | HR 91 | Temp 97.0°F | Resp 20 | Ht 61.0 in | Wt 160.7 lb

## 2013-03-12 DIAGNOSIS — D6859 Other primary thrombophilia: Secondary | ICD-10-CM

## 2013-03-12 DIAGNOSIS — D6861 Antiphospholipid syndrome: Secondary | ICD-10-CM

## 2013-03-12 DIAGNOSIS — Z7901 Long term (current) use of anticoagulants: Secondary | ICD-10-CM

## 2013-03-12 DIAGNOSIS — D696 Thrombocytopenia, unspecified: Secondary | ICD-10-CM

## 2013-03-12 DIAGNOSIS — N189 Chronic kidney disease, unspecified: Secondary | ICD-10-CM

## 2013-03-12 DIAGNOSIS — D689 Coagulation defect, unspecified: Secondary | ICD-10-CM | POA: Diagnosis not present

## 2013-03-12 LAB — CBC WITH DIFFERENTIAL/PLATELET
Basophils Absolute: 0.1 10*3/uL (ref 0.0–0.1)
EOS%: 3.4 % (ref 0.0–7.0)
HGB: 10.7 g/dL — ABNORMAL LOW (ref 11.6–15.9)
MCH: 31.3 pg (ref 25.1–34.0)
NEUT#: 3.2 10*3/uL (ref 1.5–6.5)
RDW: 13 % (ref 11.2–14.5)
WBC: 4.8 10*3/uL (ref 3.9–10.3)
lymph#: 1 10*3/uL (ref 0.9–3.3)

## 2013-03-12 LAB — COMPREHENSIVE METABOLIC PANEL (CC13)
AST: 22 U/L (ref 5–34)
Albumin: 3.8 g/dL (ref 3.5–5.0)
Alkaline Phosphatase: 72 U/L (ref 40–150)
Glucose: 80 mg/dl (ref 70–140)
Potassium: 4.5 mEq/L (ref 3.5–5.1)
Sodium: 137 mEq/L (ref 136–145)
Total Protein: 7.2 g/dL (ref 6.4–8.3)

## 2013-03-12 NOTE — Telephone Encounter (Signed)
Gave pt appt for lab and MD until December 2014

## 2013-03-13 ENCOUNTER — Other Ambulatory Visit: Payer: Self-pay | Admitting: *Deleted

## 2013-03-13 DIAGNOSIS — D6859 Other primary thrombophilia: Secondary | ICD-10-CM

## 2013-03-13 MED ORDER — FONDAPARINUX SODIUM 2.5 MG/0.5ML ~~LOC~~ SOLN: 2.5000 mg | SUBCUTANEOUS | Status: DC

## 2013-03-14 ENCOUNTER — Other Ambulatory Visit: Payer: Self-pay

## 2013-03-14 MED ORDER — DILTIAZEM HCL ER COATED BEADS 180 MG PO CP24: ORAL_CAPSULE | ORAL | Status: DC

## 2013-03-17 ENCOUNTER — Other Ambulatory Visit: Payer: Self-pay | Admitting: *Deleted

## 2013-03-17 MED ORDER — DILTIAZEM HCL ER COATED BEADS 180 MG PO CP24: ORAL_CAPSULE | ORAL | Status: DC

## 2013-03-17 NOTE — Progress Notes (Signed)
Hematology and Oncology Follow Up Visit  [name] [id number] Nov 19, 1945 67 y.o. 03/17/2013 3:10 PM   Principle Diagnosis: Encounter Diagnoses  Name Primary?  . THROMBOCYTOPENIA, CHRONIC Yes  . Antiphospholipid antibody with hypercoagulable state   . Chronic anticoagulation   . 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 She has had recurrent thrombotic events including an episode of amaurosis fugax and a TIA in the past. She has chronic moderate thrombocytopenia. She developed advanced mitral valve disease and had to undergo an emergency mitral valve replacement when she went into acute congestive heart failure with a concomitant DIC picture. She had a mechanical mitral valve replacement. 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 She developed chronic renal insufficiency.  She suffered a subdural hematoma requiring emergency evacuation while on Coumadin. Attempts to go back on Coumadin resulted in a recurrent subdural. She had an anaphylactic reaction to vitamin K in attempt to reverse the Coumadin at time of the second intracranial bleed. 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  She has been maintained on Arixtra injections 2.5 mg every other day despite  her chronic renal dysfunction in view of the Coumadin related hemorrhages in the history of heparin-induced thrombocytopenia .  Despite the mitral valve replacement, she has had refractory atrial arrhythmias. She has required DC cardioversion and a number of attempts at radiofrequency ablation. Most recently she had to have a pacemaker placed. Her cardiologist told her that she is still in atrial fibrillation.   She has done well since her  last visit here. She is not having any chest pain or palpitations. She is exercising on a regular basis.   Medications: reviewed  Allergies:  Allergies  Allergen Reactions  . Amiodarone Hcl Other (See Comments)    "torsades; v tach"  . 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 Vitamin K Anaphylaxis    IV only allergy  . Iodinated Diagnostic Agents Itching    "over 35 years ago" (03/28/2012)    Review of Systems: Constitutional:   No fever, weight loss, fatigue, HEENT no sore throat Respiratory: No cough or dyspnea Cardiovascular:  See above Gastrointestinal: No change in bowel habit Genito-Urinary: No urinary tract symptoms Musculoskeletal: No muscle bone or joint pain Neurologic: She is going to receive some Botox injections to see if it will improve the mobility of her left hand Skin: No rash or ecchymosis  Remaining ROS negative.     Physical Exam: Blood pressure 118/81, pulse 91, temperature 97 F (36.1 C), temperature source Oral, resp. rate 20, height 5\' 1"  (1.549 m), weight 160 lb 11.2 oz (72.893 kg). Wt Readings from Last 3 Encounters:  03/12/13 160 lb 11.2 oz (72.893 kg)  02/13/13 159 lb (72.122 kg)  01/20/13 157 lb (71.215 kg)     General appearance: Well-nourished Caucasian woman HENNT: Pharynx no erythema, exudate, or mass. No thyromegaly. Lymph nodes: No cervical, supraclavicular, or axillary adenopathy Breasts: Lungs: Clear to auscultation resonant to percussion Heart: Regular rhythm. Mechanical heart valve sound. No murmur. No gallop Abdomen: Soft, nontender, no mass, no organomegaly Extremities: No edema, no calf tenderness Musculoskeletal: No joint deformities GU: Vascular: No carotid bruits, no cyanosis Neurologic: She is alert and oriented, PERRLA, Skin: No rash or ecchymosis  Lab Results: Lab  Results  Component Value Date   WBC 4.8 03/12/2013   HGB 10.7* 03/12/2013   HCT 31.7* 03/12/2013   MCV 92.8 03/12/2013   PLT 87* 03/12/2013      Chemistry      Component Value Date/Time   NA 137 03/12/2013 1421   NA 140 04/08/2012 1102   K 4.5 03/12/2013 1421   K 4.6 04/08/2012 1102   CL 108* 11/04/2012 1158   CL 108 04/08/2012 1102   CO2 25 03/12/2013 1421   CO2 24 04/08/2012 1102   BUN 33.2* 03/12/2013 1421   BUN 46* 04/08/2012 1102   CREATININE 1.7* 03/12/2013 1421   CREATININE 1.7* 04/08/2012 1102      Component Value Date/Time   CALCIUM 9.1 03/12/2013 1421   CALCIUM 8.3* 04/08/2012 1102   ALKPHOS 72 03/12/2013 1421   ALKPHOS 61 02/05/2012 1042   AST 22 03/12/2013 1421   AST 24 02/05/2012 1042   ALT 16 03/12/2013 1421   ALT 18 02/05/2012 1042   BILITOT 0.58 03/12/2013 1421   BILITOT 0.5 02/05/2012 1042      Impression: #1. Antiphospholipid antibody syndrome status post multiple thrombotic events  She remains stable on Arixtra anticoagulation.  Plan: Continue long-term anticoagulation.  Her peak Lovenox level today came back lower than I would like at 0.66. I instructed her to take an extra dose today which would usually be her off day. I would then like her to change to 5 mg every other day alternating with 2.5 mg. We will check another peak level on September 29.  #2. Chronic mild thrombocytopenia secondary to #1  #3. Chronic renal insufficiency likely due to perioperative complications at time of mitral valve replacement in 2007.  #4. Mitral valve disease likely secondary to #1.  Status post  mechanical valve replacement 2007 #5. Atrial fibrillation/flutter-see discussion above now status post pacemaker implantation.  #6. Essential hypertension  #7. Hypothyroid on replacement   CC:.    Levert Feinstein, MD 9/22/20143:10 PM

## 2013-03-24 ENCOUNTER — Other Ambulatory Visit: Payer: Self-pay | Admitting: *Deleted

## 2013-03-24 ENCOUNTER — Ambulatory Visit (HOSPITAL_BASED_OUTPATIENT_CLINIC_OR_DEPARTMENT_OTHER): Payer: Medicare Other | Admitting: Lab

## 2013-03-24 DIAGNOSIS — D6859 Other primary thrombophilia: Secondary | ICD-10-CM

## 2013-03-24 DIAGNOSIS — D6861 Antiphospholipid syndrome: Secondary | ICD-10-CM

## 2013-03-25 ENCOUNTER — Encounter: Payer: Self-pay | Admitting: Oncology

## 2013-03-25 ENCOUNTER — Other Ambulatory Visit: Payer: Self-pay | Admitting: Oncology

## 2013-03-25 ENCOUNTER — Telehealth: Payer: Self-pay | Admitting: Oncology

## 2013-03-25 DIAGNOSIS — D6861 Antiphospholipid syndrome: Secondary | ICD-10-CM

## 2013-03-25 DIAGNOSIS — Z9889 Other specified postprocedural states: Secondary | ICD-10-CM

## 2013-03-25 DIAGNOSIS — Z7901 Long term (current) use of anticoagulants: Secondary | ICD-10-CM

## 2013-03-25 NOTE — Progress Notes (Signed)
Voice message. Arixtra level running a little bit higher than I would like to 1.46  Patient instructed to go back on 2.5 mg every other day except take 5 mg on Mondays. We can repeat a level again in about 2 weeks. I left instructions to call me if there were any questions.

## 2013-03-25 NOTE — Telephone Encounter (Signed)
Talked to pt gave her lab appt for 10/13

## 2013-04-07 ENCOUNTER — Other Ambulatory Visit (HOSPITAL_BASED_OUTPATIENT_CLINIC_OR_DEPARTMENT_OTHER): Payer: Medicare Other | Admitting: Lab

## 2013-04-07 ENCOUNTER — Other Ambulatory Visit: Payer: Self-pay | Admitting: Oncology

## 2013-04-07 DIAGNOSIS — N189 Chronic kidney disease, unspecified: Secondary | ICD-10-CM | POA: Diagnosis not present

## 2013-04-07 DIAGNOSIS — D6861 Antiphospholipid syndrome: Secondary | ICD-10-CM

## 2013-04-07 DIAGNOSIS — D6859 Other primary thrombophilia: Secondary | ICD-10-CM

## 2013-04-07 DIAGNOSIS — I4891 Unspecified atrial fibrillation: Secondary | ICD-10-CM | POA: Diagnosis not present

## 2013-04-07 DIAGNOSIS — Z7901 Long term (current) use of anticoagulants: Secondary | ICD-10-CM

## 2013-04-07 DIAGNOSIS — Z9889 Other specified postprocedural states: Secondary | ICD-10-CM

## 2013-04-07 DIAGNOSIS — D696 Thrombocytopenia, unspecified: Secondary | ICD-10-CM

## 2013-04-07 LAB — CBC WITH DIFFERENTIAL/PLATELET
BASO%: 1.7 % (ref 0.0–2.0)
EOS%: 4.4 % (ref 0.0–7.0)
Eosinophils Absolute: 0.1 10*3/uL (ref 0.0–0.5)
HCT: 31.9 % — ABNORMAL LOW (ref 34.8–46.6)
HGB: 10.6 g/dL — ABNORMAL LOW (ref 11.6–15.9)
LYMPH%: 25.9 % (ref 14.0–49.7)
MCH: 31.1 pg (ref 25.1–34.0)
MCHC: 33.3 g/dL (ref 31.5–36.0)
MCV: 93.3 fL (ref 79.5–101.0)
MONO%: 9.2 % (ref 0.0–14.0)
NEUT#: 1.8 10*3/uL (ref 1.5–6.5)
NEUT%: 58.8 % (ref 38.4–76.8)
Platelets: 80 10*3/uL — ABNORMAL LOW (ref 145–400)
RBC: 3.42 10*6/uL — ABNORMAL LOW (ref 3.70–5.45)
WBC: 3.1 10*3/uL — ABNORMAL LOW (ref 3.9–10.3)

## 2013-04-07 LAB — COMPREHENSIVE METABOLIC PANEL (CC13)
ALT: 15 U/L (ref 0–55)
AST: 20 U/L (ref 5–34)
Anion Gap: 8 mEq/L (ref 3–11)
BUN: 37.4 mg/dL — ABNORMAL HIGH (ref 7.0–26.0)
Calcium: 9 mg/dL (ref 8.4–10.4)
Creatinine: 1.7 mg/dL — ABNORMAL HIGH (ref 0.6–1.1)
Total Bilirubin: 0.44 mg/dL (ref 0.20–1.20)

## 2013-04-07 LAB — HEPARIN ANTI-XA: Heparin LMW: 1.16 IU/mL

## 2013-04-07 NOTE — Progress Notes (Signed)
Discussed arixtra level results w Dr Shela Commons: I will go back to original 2.5 mg QOD dose. Check level again in 1 week.  I would like to keep her level at or above 0.9

## 2013-04-08 ENCOUNTER — Telehealth: Payer: Self-pay | Admitting: Oncology

## 2013-04-08 ENCOUNTER — Other Ambulatory Visit: Payer: Self-pay

## 2013-04-08 MED ORDER — DILTIAZEM HCL ER COATED BEADS 180 MG PO CP24: ORAL_CAPSULE | ORAL | Status: DC

## 2013-04-08 NOTE — Telephone Encounter (Signed)
S.W. PT AND ADVISED ON 10.21.14 LAB...PT OK AND AWARE AWARE OF LATER APPTS

## 2013-04-09 ENCOUNTER — Other Ambulatory Visit: Payer: Self-pay

## 2013-04-09 DIAGNOSIS — Z1231 Encounter for screening mammogram for malignant neoplasm of breast: Secondary | ICD-10-CM

## 2013-04-15 ENCOUNTER — Other Ambulatory Visit (HOSPITAL_BASED_OUTPATIENT_CLINIC_OR_DEPARTMENT_OTHER): Payer: Medicare Other | Admitting: Lab

## 2013-04-15 DIAGNOSIS — Z7901 Long term (current) use of anticoagulants: Secondary | ICD-10-CM | POA: Diagnosis not present

## 2013-04-15 DIAGNOSIS — Z9889 Other specified postprocedural states: Secondary | ICD-10-CM

## 2013-04-15 DIAGNOSIS — D6861 Antiphospholipid syndrome: Secondary | ICD-10-CM

## 2013-04-15 DIAGNOSIS — D6859 Other primary thrombophilia: Secondary | ICD-10-CM

## 2013-04-23 DIAGNOSIS — Z23 Encounter for immunization: Secondary | ICD-10-CM | POA: Diagnosis not present

## 2013-04-25 ENCOUNTER — Other Ambulatory Visit: Payer: Self-pay | Admitting: Neurology

## 2013-04-25 DIAGNOSIS — G8110 Spastic hemiplegia affecting unspecified side: Secondary | ICD-10-CM

## 2013-05-01 ENCOUNTER — Other Ambulatory Visit: Payer: Self-pay

## 2013-05-02 ENCOUNTER — Encounter: Payer: Self-pay | Admitting: Internal Medicine

## 2013-05-02 ENCOUNTER — Ambulatory Visit (INDEPENDENT_AMBULATORY_CARE_PROVIDER_SITE_OTHER): Payer: Medicare Other | Admitting: Internal Medicine

## 2013-05-02 ENCOUNTER — Ambulatory Visit: Payer: Medicare Other

## 2013-05-02 VITALS — BP 114/73 | HR 93 | Ht 63.0 in | Wt 160.8 lb

## 2013-05-02 DIAGNOSIS — I495 Sick sinus syndrome: Secondary | ICD-10-CM

## 2013-05-02 DIAGNOSIS — I4892 Unspecified atrial flutter: Secondary | ICD-10-CM

## 2013-05-02 DIAGNOSIS — I447 Left bundle-branch block, unspecified: Secondary | ICD-10-CM

## 2013-05-02 DIAGNOSIS — I472 Ventricular tachycardia: Secondary | ICD-10-CM

## 2013-05-02 DIAGNOSIS — I4891 Unspecified atrial fibrillation: Secondary | ICD-10-CM | POA: Diagnosis not present

## 2013-05-02 NOTE — Patient Instructions (Signed)
Your physician wants you to follow-up in: 6 months with Dr Jens Som and 12 months with  Dr Johney Frame letter in the mail two months in advance. If you don't receive a letter, please call our office to schedule the follow-up appointment.  Remote monitoring is used to monitor your Pacemaker of ICD from home. This monitoring reduces the number of office visits required to check your device to one time per year. It allows [place] to keep an eye on the functioning of your device to ensure it is working properly. You are scheduled for a device check from home on 02/09/2015send your transmission at any time that day. If you have a wireless device, the transmission will be sent automatically. After your physician reviews your transmission, you will receive a postcard with your next transmission date.

## 2013-05-02 NOTE — Progress Notes (Signed)
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  Since last being seen in our clinic, the patient reports doing very well.  Her palpitations have resolved.  Her exercise tolerance remains very good.  She has been active in the garden this summer and is pleased with her current health state. Today, she denies symptoms of  chest pain, shortness of breath,  lower extremity edema, dizziness, presyncope, or syncope.  The patient is otherwise without complaint today.   Past Medical History  Diagnosis Date  . Rheumatic heart disease 1995    post percutaneous valvuloplasty at [facility] with subsequent mechanical mitral valve replacement and tricuspid annuloplasty at [facility]  . Subdural hematoma 2009    on coumadin  . Heparin-induced thrombocytopenia   . HTN (hypertension)   . History of Hashimoto thyroiditis   . HLD (hyperlipidemia)   . Atrial flutter     atypical right atrial flutter s/p ablation at [facility] by Dr Smith Robert  . 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 Pacemaker   . Antiphospholipid antibody syndrome   . Anemia   . Heart murmur   . CHF (congestive heart failure)   . Hypothyroidism   . History of blood transfusion     "lots; most in 2007 w/MVR OR"  . Cerebrovascular accident ?1990; 1993; 2007    residual "can't use my right hand; left visual field cut" (03/28/2012)  . Arthritis     "may have some" (03/28/2012)  . Chronic kidney disease     "as a result of my heart failing" (03/28/2012)  . Fine motor impairment     "fingers right hand" (03/28/2012)   Past Surgical History  Procedure Laterality Date  . Mitral valve replacement  2007    with tricuspid annuloplasty  . Amputation      left atrial appendage amputated with MVR  . Craniotomy  2008    for SDH  . Cholecystectomy    . Tonsillectomy    . Percutaneous balloon valvuloplasty  1995     At [facility] with subsequent mechanical mitral valve replacement and tricuspid annuloplasty at [facility]  . Atrial ablation surgery      CTI and R atriostomy scar flutter ablation at [facility] 8/12,  repeat  ablation at [facility]  9/12  . Cardiac valve replacement    . Tonsillectomy      "when I was a child"  . Total abdominal hysterectomy  ?1995    TAH; BSO  . Biventricular pacemaker placement  03/28/2012    SJM Anthem implanted by Dr Ladona Ridgel  . Cardioversion  2010-2011    "3 at [facility]" (03/28/2012)  . Cardiac electrophysiology mapping and ablation  2012    "2 @ [facility]" (03/28/2012    Current Outpatient Prescriptions  Medication Sig Dispense Refill  . acetaminophen (TYLENOL) 650 MG CR tablet Take 650 mg by mouth as needed.       . AMOXICILLIN PO Take 4 capsules by mouth as directed. ONLY WHEN VISITING DENTIST      . Calcium-Magnesium (CAL-MAG PO) Take 1 tablet by mouth daily.       . Cholecalciferol (VITAMIN D) 2000 UNITS tablet Take 2,000 Units by mouth daily.        Marland Kitchen diltiazem (CARDIZEM CD) 180 MG 24 hr capsule Take 2 by mouth daily  180 capsule  3  . Estradiol Acetate (FEMRING) 0.05  MG/24HR RING Place 1 each vaginally as directed.       . fish oil-omega-3 fatty acids 1000 MG capsule Take 1 g by mouth daily.       . fluticasone (FLONASE) 50 MCG/ACT nasal spray Place 2 sprays into the nose daily.      . fondaparinux (ARIXTRA) 2.5 MG/0.5ML SOLN injection Inject 0.5 mLs (2.5 mg total) into the skin every other day.  3.5 mL  6  . levothyroxine (SYNTHROID, LEVOTHROID) 75 MCG tablet Take 75 mcg by mouth daily.        Marland Kitchen lisinopril (PRINIVIL,ZESTRIL) 5 MG tablet Take 2.5 mg by mouth 2 (two) times daily.       . valACYclovir (VALTREX) 1000 MG tablet Take 1,000 mg by mouth 2 (two) times daily. For a total of 3 doses for labial herpes prn only       Current Facility-Administered Medications  Medication Dose Route Frequency Provider Last Rate Last Dose  . botulinum toxin Type A (BOTOX) injection 40  Units  40 Units Intramuscular Once Omelia Blackwater, DO        Physical Exam: Filed Vitals:   05/02/13 1124  BP: 114/73  Pulse: 93  Height: 5\' 3"  (1.6 m)  Weight: 160 lb 12.8 oz (72.938 kg)    GEN- The patient is well appearing, alert and oriented x 3 today.   Head- normocephalic, atraumatic Eyes-  Sclera clear, conjunctiva pink Ears- hearing intact Oropharynx- clear Lungs- Clear to ausculation bilaterally, normal work of breathing Chest- pacemaker pocket is well healed Heart- Regular rate and rhythm (paced), mechanical S1 is crisp GI- soft, NT, ND, + BS Extremities- no clubbing, cyanosis, or edema  Pacemaker interrogation- reviewed in detail today,  See PACEART report  Assessment and Plan:  1. Tach/brady Normal BiV pacemaker function with 97% Biv pacing See Pace Art report No changes today  2. afib Continue long term rate control and anticoagulation  3. Valvular heart disease Stable No change required today No changes at this time  Merlin Return in 1 year

## 2013-05-07 LAB — MDC_IDC_ENUM_SESS_TYPE_INCLINIC
Battery Remaining Longevity: 64.8 mo
Battery Remaining Longevity: 64.8 mo
Battery Voltage: 2.93 V
Brady Statistic RA Percent Paced: 0.03 %
Brady Statistic RA Percent Paced: 0.03 %
Brady Statistic RV Percent Paced: 97 %
Date Time Interrogation Session: 20141107172539
Implantable Pulse Generator Model: 3210
Implantable Pulse Generator Serial Number: 2786353
Implantable Pulse Generator Serial Number: 2786353
Lead Channel Impedance Value: 1112.5 Ohm
Lead Channel Impedance Value: 400 Ohm
Lead Channel Impedance Value: 400 Ohm
Lead Channel Impedance Value: 437.5 Ohm
Lead Channel Pacing Threshold Amplitude: 0.5 V
Lead Channel Pacing Threshold Amplitude: 0.5 V
Lead Channel Pacing Threshold Amplitude: 0.75 V
Lead Channel Pacing Threshold Amplitude: 0.75 V
Lead Channel Pacing Threshold Pulse Width: 0.5 ms
Lead Channel Pacing Threshold Pulse Width: 0.5 ms
Lead Channel Pacing Threshold Pulse Width: 0.5 ms
Lead Channel Pacing Threshold Pulse Width: 0.8 ms
Lead Channel Sensing Intrinsic Amplitude: 0.6 mV
Lead Channel Sensing Intrinsic Amplitude: 7.7 mV
Lead Channel Setting Pacing Amplitude: 1.5 V
Lead Channel Setting Pacing Amplitude: 2 V
Lead Channel Setting Pacing Amplitude: 2 V
Lead Channel Setting Pacing Amplitude: 2 V
Lead Channel Setting Pacing Pulse Width: 0.5 ms
Lead Channel Setting Pacing Pulse Width: 0.8 ms
Lead Channel Setting Sensing Sensitivity: 2 mV
Lead Channel Setting Sensing Sensitivity: 2 mV

## 2013-05-12 ENCOUNTER — Other Ambulatory Visit: Payer: Medicare Other | Admitting: Lab

## 2013-05-12 ENCOUNTER — Other Ambulatory Visit: Payer: Self-pay | Admitting: *Deleted

## 2013-05-12 DIAGNOSIS — R3915 Urgency of urination: Secondary | ICD-10-CM | POA: Diagnosis not present

## 2013-05-12 DIAGNOSIS — Z124 Encounter for screening for malignant neoplasm of cervix: Secondary | ICD-10-CM | POA: Diagnosis not present

## 2013-05-12 DIAGNOSIS — D631 Anemia in chronic kidney disease: Secondary | ICD-10-CM | POA: Diagnosis not present

## 2013-05-12 DIAGNOSIS — I10 Essential (primary) hypertension: Secondary | ICD-10-CM | POA: Diagnosis not present

## 2013-05-12 DIAGNOSIS — N189 Chronic kidney disease, unspecified: Secondary | ICD-10-CM | POA: Diagnosis not present

## 2013-05-15 ENCOUNTER — Other Ambulatory Visit (HOSPITAL_BASED_OUTPATIENT_CLINIC_OR_DEPARTMENT_OTHER): Payer: Medicare Other | Admitting: Lab

## 2013-05-15 ENCOUNTER — Ambulatory Visit
Admission: RE | Admit: 2013-05-15 | Discharge: 2013-05-15 | Disposition: A | Discharge status: Home / Self Care | Payer: Medicare Other | Source: Ambulatory Visit

## 2013-05-15 DIAGNOSIS — Z7901 Long term (current) use of anticoagulants: Secondary | ICD-10-CM

## 2013-05-15 DIAGNOSIS — D6859 Other primary thrombophilia: Secondary | ICD-10-CM

## 2013-05-15 DIAGNOSIS — I10 Essential (primary) hypertension: Secondary | ICD-10-CM | POA: Diagnosis not present

## 2013-05-15 DIAGNOSIS — E039 Hypothyroidism, unspecified: Secondary | ICD-10-CM | POA: Diagnosis not present

## 2013-05-15 DIAGNOSIS — E785 Hyperlipidemia, unspecified: Secondary | ICD-10-CM | POA: Diagnosis not present

## 2013-05-15 DIAGNOSIS — D689 Coagulation defect, unspecified: Secondary | ICD-10-CM | POA: Diagnosis not present

## 2013-05-15 DIAGNOSIS — D6861 Antiphospholipid syndrome: Secondary | ICD-10-CM

## 2013-05-15 DIAGNOSIS — Z1231 Encounter for screening mammogram for malignant neoplasm of breast: Secondary | ICD-10-CM | POA: Diagnosis not present

## 2013-05-15 DIAGNOSIS — E782 Mixed hyperlipidemia: Secondary | ICD-10-CM | POA: Diagnosis not present

## 2013-05-15 DIAGNOSIS — D696 Thrombocytopenia, unspecified: Secondary | ICD-10-CM

## 2013-05-15 DIAGNOSIS — Z0000 Encounter for general adult medical examination without abnormal findings: Secondary | ICD-10-CM | POA: Diagnosis not present

## 2013-05-15 DIAGNOSIS — Z8349 Family history of other endocrine, nutritional and metabolic diseases: Secondary | ICD-10-CM | POA: Diagnosis not present

## 2013-05-15 DIAGNOSIS — Z1331 Encounter for screening for depression: Secondary | ICD-10-CM | POA: Diagnosis not present

## 2013-05-15 DIAGNOSIS — Z800 Family history of malignant neoplasm of digestive organs: Secondary | ICD-10-CM | POA: Diagnosis not present

## 2013-05-15 DIAGNOSIS — Z954 Presence of other heart-valve replacement: Secondary | ICD-10-CM | POA: Diagnosis not present

## 2013-05-15 LAB — CBC WITH DIFFERENTIAL/PLATELET
BASO%: 3.1 % — ABNORMAL HIGH (ref 0.0–2.0)
Basophils Absolute: 0.1 10*3/uL (ref 0.0–0.1)
EOS%: 4.5 % (ref 0.0–7.0)
Eosinophils Absolute: 0.2 10*3/uL (ref 0.0–0.5)
HGB: 10.6 g/dL — ABNORMAL LOW (ref 11.6–15.9)
MCH: 30.4 pg (ref 25.1–34.0)
MCV: 93.4 fL (ref 79.5–101.0)
MONO%: 6.5 % (ref 0.0–14.0)
NEUT#: 2.3 10*3/uL (ref 1.5–6.5)
RBC: 3.48 10*6/uL — ABNORMAL LOW (ref 3.70–5.45)
RDW: 13.3 % (ref 11.2–14.5)
WBC: 3.9 10*3/uL (ref 3.9–10.3)
lymph#: 1 10*3/uL (ref 0.9–3.3)

## 2013-05-15 LAB — COMPREHENSIVE METABOLIC PANEL (CC13)
ALT: 17 U/L (ref 0–55)
AST: 22 U/L (ref 5–34)
Albumin: 4 g/dL (ref 3.5–5.0)
Alkaline Phosphatase: 92 U/L (ref 40–150)
Calcium: 9.3 mg/dL (ref 8.4–10.4)
Chloride: 108 mEq/L (ref 98–109)
Potassium: 4.3 mEq/L (ref 3.5–5.1)
Sodium: 140 mEq/L (ref 136–145)
Total Protein: 7.4 g/dL (ref 6.4–8.3)

## 2013-05-16 ENCOUNTER — Telehealth: Payer: Self-pay | Admitting: *Deleted

## 2013-05-16 NOTE — Telephone Encounter (Signed)
Pt notified of lab results per Dr Patsy Lager instructions & labs forwarded to Dr. Eliott Nine.

## 2013-05-16 NOTE — Telephone Encounter (Signed)
Message copied by Sabino Snipes on Fri May 16, 2013 12:08 PM ------      Message from: Levert Feinstein      Created: Thu May 15, 2013  4:14 PM       Call Dr Shela Commons -evertyhing about the same: arixtra level stable @ 0.71, platelets 85,000; creatinine 1.6      Forward cc lab to Dr Camille Bal ------

## 2013-05-19 ENCOUNTER — Ambulatory Visit: Payer: PRIVATE HEALTH INSURANCE | Admitting: Neurology

## 2013-05-28 ENCOUNTER — Encounter: Payer: Self-pay | Admitting: Neurology

## 2013-05-28 ENCOUNTER — Ambulatory Visit (INDEPENDENT_AMBULATORY_CARE_PROVIDER_SITE_OTHER): Payer: Medicare Other | Admitting: Neurology

## 2013-05-28 VITALS — BP 115/82 | HR 82 | Ht 63.0 in | Wt 161.0 lb

## 2013-05-28 DIAGNOSIS — G8110 Spastic hemiplegia affecting unspecified side: Secondary | ICD-10-CM | POA: Diagnosis not present

## 2013-05-28 MED ORDER — ONABOTULINUMTOXINA 100 UNITS IJ SOLR: 25.0000 [IU] | Freq: Once | INTRAMUSCULAR | Status: DC

## 2013-05-28 NOTE — Progress Notes (Signed)
Provider:  Dr Hosie Poisson Referring Provider: Marden Noble, MD Primary Care Physician:  Cassandra Dubonnet, MD  CC:  Post stroke spasticity  HPI:  [name] is a 67 y.o. female here for repeat Botox injections for post stroke spasticity. Last injections were on 02/13/2013. At that time she received a total of 10 units of Botox. Noted minimal initial benefit but no sustained relief. Continues to have flexion of the PIP and DIP, worst in the 2nd and 3rd digit on the right.    Contraindications and precautions discussed with patient. EMG guidance was used to inject muscles detailed below. Aseptic procedure was observed and patient tolerated procedure. Procedure performed by Dr. Elspeth Cho.  The condition has existed for more than 6 months, and pt does not have a diagnosis of ALS, Myasthenia Gravis or Lambert-Eaton Syndrome.  Risks and benefits of injections discussed and pt agrees to proceed with the procedure.  Written consent obtained These injections are medically necessary.  These injections do not cause sedations or hallucinations which the oral therapies may cause.  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 Marital Status: Married    Spouse Name: [name]     Number of Children: 0  . Years of Education: college   Occupational History  . PHYSICIAN     retired   Social History Main Topics  . Smoking status: Former Smoker -- 0.10 packs/day for 6 years    Types: Cigarettes  . Smokeless tobacco: Never Used     Comment: 03/28/2012 "smoked in the 1970's"  . Alcohol Use: 0.0 oz/week     Comment: 03/28/2012 "very rare glass of wine"  . Drug Use: No  . Sexual Activity: Yes   Other Topics Concern  . 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 Lives at home with her  husband.  Quit smoking 25 years ago and quit alcohol 2 years ago.   Denies alcohol, caffeine,  and illicit drug use.      Family History  Problem Relation Age of Onset  . Heart disease Father     late 65's early 80's-CABG  . Dementia Father   . Diabetes Father     Past Medical History  Diagnosis Date  . Rheumatic heart disease 1995    post percutaneous valvuloplasty at [facility] with subsequent mechanical mitral valve replacement and tricuspid annuloplasty at [facility]  . Subdural hematoma 2009    on coumadin  . Heparin-induced thrombocytopenia   . HTN (hypertension)   . History of Hashimoto thyroiditis   . HLD (hyperlipidemia)   . Atrial flutter     atypical right atrial flutter s/p ablation at [facility] by Dr Smith Robert  . 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 Pacemaker   . Antiphospholipid antibody syndrome   . Anemia   . Heart murmur   . CHF (congestive heart failure)   . Hypothyroidism   . History of blood transfusion     "lots; most in 2007 w/MVR OR"  . Cerebrovascular accident ?1990; 1993; 2007    residual "can't use my right hand; left visual field cut" (03/28/2012)  . Arthritis     "may have some" (03/28/2012)  . Chronic kidney disease     "as  a result of my heart failing" (03/28/2012)  . Fine motor impairment     "fingers right hand" (03/28/2012)    Past Surgical History  Procedure Laterality Date  . Mitral valve replacement  2007    with tricuspid annuloplasty  . Amputation      left atrial appendage amputated with MVR  . Craniotomy  2008    for SDH  . Cholecystectomy    . Tonsillectomy    . Percutaneous balloon valvuloplasty  1995    At [facility] with subsequent mechanical mitral valve replacement and tricuspid annuloplasty at [facility]  . Atrial ablation surgery      CTI and R atriostomy scar flutter ablation at [facility] 8/12,  repeat  ablation at [facility]  9/12  . Cardiac valve replacement    . Tonsillectomy      "when I was a  child"  . Total abdominal hysterectomy  ?1995    TAH; BSO  . Biventricular pacemaker placement  03/28/2012    SJM Anthem implanted by Dr Ladona Ridgel  . Cardioversion  2010-2011    "3 at [facility]" (03/28/2012)  . Cardiac electrophysiology mapping and ablation  2012    "2 @ [facility]" (03/28/2012    Current Outpatient Prescriptions  Medication Sig Dispense Refill  . acetaminophen (TYLENOL) 650 MG CR tablet Take 650 mg by mouth as needed.       . AMOXICILLIN PO Take 4 capsules by mouth as directed. ONLY WHEN VISITING DENTIST      . Calcium-Magnesium (CAL-MAG PO) Take 1 tablet by mouth daily.       . Cholecalciferol (VITAMIN D) 2000 UNITS tablet Take 2,000 Units by mouth daily.        Marland Kitchen diltiazem (CARDIZEM CD) 180 MG 24 hr capsule Take 2 by mouth daily  180 capsule  3  . Estradiol Acetate (FEMRING) 0.05 MG/24HR RING Place 1 each vaginally as directed.       . fish oil-omega-3 fatty acids 1000 MG capsule Take 1 g by mouth daily.       . fluticasone (FLONASE) 50 MCG/ACT nasal spray Place 2 sprays into the nose daily.      . fondaparinux (ARIXTRA) 2.5 MG/0.5ML SOLN injection Inject 0.5 mLs (2.5 mg total) into the skin every other day.  3.5 mL  6  . levothyroxine (SYNTHROID, LEVOTHROID) 75 MCG tablet Take 75 mcg by mouth daily.        Marland Kitchen lisinopril (PRINIVIL,ZESTRIL) 5 MG tablet Take 2.5 mg by mouth 2 (two) times daily.       . valACYclovir (VALTREX) 1000 MG tablet Take 1,000 mg by mouth 2 (two) times daily. For a total of 3 doses for labial herpes prn only       Current Facility-Administered Medications  Medication Dose Route Frequency Provider Last Rate Last Dose  . botulinum toxin Type A (BOTOX) injection 40 Units  40 Units Intramuscular Once Omelia Blackwater, DO        Allergies as of 05/28/2013 - Review Complete 05/02/2013  Allergen Reaction Noted  . Amiodarone hcl Other (See Comments)   . Heparin Other (See Comments)   . Vitamin k Anaphylaxis   . Iodinated diagnostic agents Itching 10/10/2010     Vitals: There were no vitals taken for this visit. Last Weight:  Wt Readings from Last 1 Encounters:  05/02/13 160 lb 12.8 oz (72.938 kg)   Last Height:   Ht Readings from Last 1 Encounters:  05/02/13 5\' 3"  (1.6 m)  Ms Deblois is a pleasant 67y/o with L sided spastic hemi-paresis secondary to a prior CVA who presents for initial Botox injections.    1) Botox injections as detailed above. A total of 25 units and 0 units wated used in 1:1 dilution with injections to the L FDS and L FDP 2) Tylenol or Motrin for injection site pain. 3) Medication guide dispensed. 4) Follow up for repeat injections in 3 months -benefits and risks discussed with patient. consent was obtained. possible side effects were discussed

## 2013-06-09 ENCOUNTER — Other Ambulatory Visit (HOSPITAL_BASED_OUTPATIENT_CLINIC_OR_DEPARTMENT_OTHER): Payer: Medicare Other

## 2013-06-09 ENCOUNTER — Other Ambulatory Visit: Payer: Self-pay | Admitting: Neurology

## 2013-06-09 ENCOUNTER — Ambulatory Visit (HOSPITAL_BASED_OUTPATIENT_CLINIC_OR_DEPARTMENT_OTHER): Payer: Medicare Other | Admitting: Oncology

## 2013-06-09 VITALS — BP 109/69 | HR 81 | Temp 97.7°F | Resp 18 | Ht 63.0 in | Wt 163.2 lb

## 2013-06-09 DIAGNOSIS — I4891 Unspecified atrial fibrillation: Secondary | ICD-10-CM

## 2013-06-09 DIAGNOSIS — I10 Essential (primary) hypertension: Secondary | ICD-10-CM

## 2013-06-09 DIAGNOSIS — Z7901 Long term (current) use of anticoagulants: Secondary | ICD-10-CM

## 2013-06-09 DIAGNOSIS — D696 Thrombocytopenia, unspecified: Secondary | ICD-10-CM | POA: Diagnosis not present

## 2013-06-09 DIAGNOSIS — I059 Rheumatic mitral valve disease, unspecified: Secondary | ICD-10-CM | POA: Diagnosis not present

## 2013-06-09 DIAGNOSIS — N289 Disorder of kidney and ureter, unspecified: Secondary | ICD-10-CM

## 2013-06-09 DIAGNOSIS — Z8679 Personal history of other diseases of the circulatory system: Secondary | ICD-10-CM | POA: Diagnosis not present

## 2013-06-09 DIAGNOSIS — D6861 Antiphospholipid syndrome: Secondary | ICD-10-CM

## 2013-06-09 DIAGNOSIS — D6859 Other primary thrombophilia: Secondary | ICD-10-CM | POA: Diagnosis not present

## 2013-06-09 DIAGNOSIS — G8110 Spastic hemiplegia affecting unspecified side: Secondary | ICD-10-CM

## 2013-06-09 DIAGNOSIS — Z950 Presence of cardiac pacemaker: Secondary | ICD-10-CM

## 2013-06-09 LAB — HEPARIN ANTI-XA: Heparin LMW: 0.64 IU/mL

## 2013-06-09 LAB — CBC WITH DIFFERENTIAL/PLATELET
BASO%: 1.1 % (ref 0.0–2.0)
Basophils Absolute: 0.1 10*3/uL (ref 0.0–0.1)
Eosinophils Absolute: 0.2 10*3/uL (ref 0.0–0.5)
HCT: 31.4 % — ABNORMAL LOW (ref 34.8–46.6)
HGB: 10.3 g/dL — ABNORMAL LOW (ref 11.6–15.9)
MCH: 31.2 pg (ref 25.1–34.0)
MCHC: 32.9 g/dL (ref 31.5–36.0)
MONO%: 6.2 % (ref 0.0–14.0)
NEUT#: 4.4 10*3/uL (ref 1.5–6.5)
NEUT%: 71.4 % (ref 38.4–76.8)
Platelets: 93 10*3/uL — ABNORMAL LOW (ref 145–400)
RDW: 13.8 % (ref 11.2–14.5)
lymph#: 1.1 10*3/uL (ref 0.9–3.3)

## 2013-06-09 LAB — COMPREHENSIVE METABOLIC PANEL (CC13)
Albumin: 3.9 g/dL (ref 3.5–5.0)
Anion Gap: 10 mEq/L (ref 3–11)
BUN: 39.3 mg/dL — ABNORMAL HIGH (ref 7.0–26.0)
CO2: 24 mEq/L (ref 22–29)
Calcium: 9.3 mg/dL (ref 8.4–10.4)
Chloride: 108 mEq/L (ref 98–109)
Glucose: 118 mg/dl (ref 70–140)
Potassium: 4.5 mEq/L (ref 3.5–5.1)

## 2013-06-10 NOTE — Progress Notes (Signed)
Hematology and Oncology Follow Up Visit  CIPRIANA BILLER [id number] [birth year] 67 y.o. 06/10/2013 7:05 PM   Principle Diagnosis: Encounter Diagnoses  Name Primary?  Marland Kitchen Antiphospholipid antibody with hypercoagulable state Yes  . Pacemaker-St.Jude   . 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 She has had recurrent thrombotic events including an episode of amaurosis fugax and a TIA in the past. She has chronic moderate thrombocytopenia. She developed advanced mitral valve disease and had to undergo an emergency mitral valve replacement when she went into acute congestive heart failure with a concomitant DIC picture. She had a mechanical mitral valve replacement. 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 She developed chronic renal insufficiency.  She suffered a subdural hematoma requiring emergency evacuation while on Coumadin. Attempts to go back on Coumadin resulted in a recurrent subdural. She had an anaphylactic reaction to vitamin K in attempt to reverse the Coumadin at time of the second intracranial bleed. 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  She has been maintained on Arixtra injections 2.5 mg every other day despite her chronic renal dysfunction in view of the Coumadin related hemorrhages in the history of heparin-induced thrombocytopenia  .  Despite the mitral valve replacement, she has had refractory atrial arrhythmias. She has required DC cardioversion and a number of attempts at radiofrequency ablation. Most recently she had to have a pacemaker placed. Her cardiologist told her that she is still in atrial fibrillation.  She has done well since her last visit here. She is not having any  chest pain or palpitations. She is exercising on a regular basis.  Recent peak levels of Arixtra had been running low despite stable renal function. I tried to make a dose adjustment. 5 mg alternating with 2.5 mg every other day given acceptably high peak level. She is drifted down again and I am uncomfortable with today's level of 0.64 units and I would like to see her closer to 1.0.   Medications: reviewed  Allergies:  Allergies  Allergen Reactions  . Amiodarone Hcl Other (See Comments)    "torsades; v tach"  . 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 Vitamin K Anaphylaxis    IV only allergy  . Iodinated Diagnostic Agents Itching    "over 35 years ago" (03/28/2012)    Review of Systems: Hematology: No bleeding or bruising ENT ROS: No sore throat Breast ROS: Recently examined by her GYN no abnormal findings per her history Respiratory ROS: No cough or dyspnea Cardiovascular ROS: No chest pain or palpitations. She has noted some increasing edema of her lower extremities Gastrointestinal ROS: No abdominal pain or change in bowel habit Genito-Urinary ROS no urinary tract symptoms Musculoskeletal ROS: No muscle bone or joint pain Neurological ROS: No headache or change in vision. No new area of weakness. She recently established with a new neurologist who recommended Botox injections to see if this would help her left hand contracture. Dermatological ROS: No rash or ecchymosis Remaining ROS negative  Physical Exam: Blood pressure 109/69, pulse 81, temperature 97.7 F (36.5 C), temperature source Oral, resp. rate 18, height 5\' 3"  (1.6 m), weight 163 lb 3.2 oz (74.027 kg). Wt Readings from Last 3 Encounters:  06/09/13 163 lb 3.2 oz (74.027 kg)  05/28/13 161  lb (73.029 kg)  05/02/13 160 lb 12.8 oz (72.938 kg)     General appearance: Well-nourished Caucasian woman HENNT: Pharynx no erythema, exudate, mass, or ulcer. No thyromegaly or thyroid nodules Lymph nodes: No cervical,  supraclavicular, or axillary lymphadenopathy Breasts: Lungs: Clear to auscultation, resonant to percussion throughout Heart: Regular rhythm, no murmur, no gallop, no rub, no click, no edema Abdomen: Soft, nontender, normal bowel sounds, no mass, no organomegaly Extremities: No edema, no calf tenderness Musculoskeletal: no joint deformities. Calf measurements symmetric 41 cm left and right, ankle measurements 25 cm left and right. GU:  Vascular: Carotid pulses 2+, no bruits, distal pulses: Dorsalis pedis 1+ symmetric Neurologic: Alert, oriented, PERRLA,  cranial nerves grossly normal, motor strength 5 over 5, except for the left hand where there is a contracture of her index and thumb, decreased grip, reflexes 1+ symmetric,  Skin: No rash or ecchymosis  Lab Results: CBC W/Diff    Component Value Date/Time   WBC 6.1 06/09/2013 1533   WBC 4.6 03/19/2012 1652   RBC 3.31* 06/09/2013 1533   RBC 3.45* 03/19/2012 1652   RBC 2.78* 07/25/2007 1330   HGB 10.3* 06/09/2013 1533   HGB 10.8* 03/19/2012 1652   HCT 31.4* 06/09/2013 1533   HCT 32.8* 03/19/2012 1652   PLT 93* 06/09/2013 1533   PLT 82.0* 03/19/2012 1652   MCV 95.0 06/09/2013 1533   MCV 94.9 03/19/2012 1652   MCH 31.2 06/09/2013 1533   MCH 32.0 07/04/2010 1458   MCHC 32.9 06/09/2013 1533   MCHC 33.0 03/19/2012 1652   RDW 13.8 06/09/2013 1533   RDW 12.4 03/19/2012 1652   LYMPHSABS 1.1 06/09/2013 1533   LYMPHSABS 1.2 03/19/2012 1652   MONOABS 0.4 06/09/2013 1533   MONOABS 0.3 03/19/2012 1652   EOSABS 0.2 06/09/2013 1533   EOSABS 0.2 03/19/2012 1652   BASOSABS 0.1 06/09/2013 1533   BASOSABS 0.0 03/19/2012 1652     Chemistry      Component Value Date/Time   NA 141 06/09/2013 1534   NA 140 04/08/2012 1102   K 4.5 06/09/2013 1534   K 4.6 04/08/2012 1102   CL 108* 11/04/2012 1158   CL 108 04/08/2012 1102   CO2 24 06/09/2013 1534   CO2 24 04/08/2012 1102   BUN 39.3* 06/09/2013 1534   BUN 46* 04/08/2012 1102   CREATININE 1.8* 06/09/2013  1534   CREATININE 1.7* 04/08/2012 1102      Component Value Date/Time   CALCIUM 9.3 06/09/2013 1534   CALCIUM 8.3* 04/08/2012 1102   ALKPHOS 100 06/09/2013 1534   ALKPHOS 61 02/05/2012 1042   AST 26 06/09/2013 1534   AST 24 02/05/2012 1042   ALT 20 06/09/2013 1534   ALT 18 02/05/2012 1042   BILITOT 0.43 06/09/2013 1534   BILITOT 0.5 02/05/2012 1042       Radiological Studies: Mm Digital Screening  05/16/2013   CLINICAL DATA:  Screening.  EXAM: DIGITAL SCREENING BILATERAL MAMMOGRAM WITH CAD  COMPARISON:  Previous exam(s).  ACR Breast Density Category b: There are scattered areas of fibroglandular density.  FINDINGS: There are no findings suspicious for malignancy. Images were processed with CAD.  IMPRESSION: No mammographic evidence of malignancy. A result letter of this screening mammogram will be mailed directly to the patient.  RECOMMENDATION: Screening mammogram in one year. (Code:[id number])  BI-RADS CATEGORY  1: Negative   Electronically Signed   By: Jerene Dilling M.D.   On: 05/16/2013 18:29    Impression:  #1. Antiphospholipid antibody syndrome status  post multiple thrombotic events  She remains stable on Arixtra anticoagulation.  Plan: Continue long-term anticoagulation.  I will try to dose adjust her again with target Arixtra level 0.8-1.1. She will take a single additional 5 mg dose per week and otherwise stay on the 2.5 mg every other day and we will check a peak level again in 2 weeks.   #2. Chronic mild thrombocytopenia secondary to #1   #3. Chronic renal insufficiency likely due to perioperative complications at time of mitral valve replacement in 2007.   #4. Mitral valve disease likely secondary to #1. Status post mechanical valve replacement 2007   #5. Atrial fibrillation/flutter- now status post pacemaker implantation having failed DC cardioversion and a radiofrequency ablation procedure.   #6. Essential hypertension   #7. Hypothyroid on replacement    CC:  Patient Care Team: Marden Noble, MD as PCP - General (Internal Medicine) Levert Feinstein, MD as Consulting Physician (Oncology) Sadie Haber, MD as Consulting Physician (Nephrology) Lewayne Bunting, MD as Consulting Physician (Cardiology) Hillis Range, MD as Consulting Physician (Cardiology)   Levert Feinstein, MD 12/16/20147:05 PM

## 2013-06-23 ENCOUNTER — Telehealth: Payer: Self-pay | Admitting: *Deleted

## 2013-06-23 NOTE — Telephone Encounter (Signed)
Received vm call from pt stating that she is scheduled for wed for arixtra level to see if increasing her dose a little will make her level a little better.  She states that her insurance changes the following day to a new Part B with [employer].  She would like Dr Cyndie Chime to call [facility] with additional # 4- 5mg  syringes if level is where he wants & they can fill that evening otherwise [employer] will need a letter.  She will bring a copy of the old letter Dr Cyndie Chime wrote when she comes Wed in case needed.  Note to Dr Cyndie Chime.

## 2013-06-25 ENCOUNTER — Other Ambulatory Visit: Payer: Self-pay | Admitting: *Deleted

## 2013-06-25 ENCOUNTER — Other Ambulatory Visit (HOSPITAL_BASED_OUTPATIENT_CLINIC_OR_DEPARTMENT_OTHER): Payer: Medicare Other

## 2013-06-25 ENCOUNTER — Encounter: Payer: Self-pay | Admitting: Oncology

## 2013-06-25 DIAGNOSIS — D689 Coagulation defect, unspecified: Secondary | ICD-10-CM | POA: Diagnosis not present

## 2013-06-25 DIAGNOSIS — D6859 Other primary thrombophilia: Secondary | ICD-10-CM

## 2013-06-25 DIAGNOSIS — D6861 Antiphospholipid syndrome: Secondary | ICD-10-CM

## 2013-06-25 DIAGNOSIS — R040 Epistaxis: Secondary | ICD-10-CM | POA: Diagnosis not present

## 2013-06-25 DIAGNOSIS — Z950 Presence of cardiac pacemaker: Secondary | ICD-10-CM | POA: Diagnosis not present

## 2013-06-25 DIAGNOSIS — D696 Thrombocytopenia, unspecified: Secondary | ICD-10-CM

## 2013-06-25 DIAGNOSIS — Z139 Encounter for screening, unspecified: Secondary | ICD-10-CM | POA: Diagnosis not present

## 2013-06-25 LAB — HEPARIN ANTI-XA: Heparin LMW: 0.81 IU/mL

## 2013-06-25 MED ORDER — FONDAPARINUX SODIUM 5 MG/0.4ML ~~LOC~~ SOLN: 5.0000 mg | SUBCUTANEOUS | Status: DC

## 2013-06-25 NOTE — Progress Notes (Signed)
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 Fondaparinux levels lower than I would like. Titrating  dose. 2.5 mg SQ QOD.  Increase 1 weekly dose to 5 mg.  Peak level today 0.81 = acceptable. Discussed w Dr Shela Commons by phone.

## 2013-07-14 ENCOUNTER — Other Ambulatory Visit: Payer: Medicare Other | Admitting: Lab

## 2013-07-21 ENCOUNTER — Other Ambulatory Visit (HOSPITAL_BASED_OUTPATIENT_CLINIC_OR_DEPARTMENT_OTHER): Payer: Medicare Other

## 2013-07-21 DIAGNOSIS — D6861 Antiphospholipid syndrome: Secondary | ICD-10-CM

## 2013-07-21 DIAGNOSIS — D6859 Other primary thrombophilia: Secondary | ICD-10-CM

## 2013-07-21 DIAGNOSIS — I4891 Unspecified atrial fibrillation: Secondary | ICD-10-CM

## 2013-07-21 DIAGNOSIS — Z7901 Long term (current) use of anticoagulants: Secondary | ICD-10-CM

## 2013-07-21 DIAGNOSIS — D696 Thrombocytopenia, unspecified: Secondary | ICD-10-CM | POA: Diagnosis not present

## 2013-07-21 LAB — CBC WITH DIFFERENTIAL/PLATELET
BASO%: 1.3 % (ref 0.0–2.0)
BASOS ABS: 0.1 10*3/uL (ref 0.0–0.1)
EOS%: 4.2 % (ref 0.0–7.0)
Eosinophils Absolute: 0.3 10*3/uL (ref 0.0–0.5)
HCT: 31 % — ABNORMAL LOW (ref 34.8–46.6)
HEMOGLOBIN: 10.3 g/dL — AB (ref 11.6–15.9)
LYMPH%: 15.2 % (ref 14.0–49.7)
MCH: 31.4 pg (ref 25.1–34.0)
MCHC: 33.2 g/dL (ref 31.5–36.0)
MCV: 94.6 fL (ref 79.5–101.0)
MONO#: 0.3 10*3/uL (ref 0.1–0.9)
MONO%: 4.9 % (ref 0.0–14.0)
NEUT#: 4.7 10*3/uL (ref 1.5–6.5)
NEUT%: 74.4 % (ref 38.4–76.8)
PLATELETS: 96 10*3/uL — AB (ref 145–400)
RBC: 3.28 10*6/uL — AB (ref 3.70–5.45)
RDW: 13.1 % (ref 11.2–14.5)
WBC: 6.4 10*3/uL (ref 3.9–10.3)
lymph#: 1 10*3/uL (ref 0.9–3.3)

## 2013-07-21 LAB — COMPREHENSIVE METABOLIC PANEL (CC13)
ALK PHOS: 82 U/L (ref 40–150)
ALT: 16 U/L (ref 0–55)
AST: 19 U/L (ref 5–34)
Albumin: 4.1 g/dL (ref 3.5–5.0)
Anion Gap: 8 mEq/L (ref 3–11)
BUN: 39.7 mg/dL — AB (ref 7.0–26.0)
CALCIUM: 9.4 mg/dL (ref 8.4–10.4)
CHLORIDE: 107 meq/L (ref 98–109)
CO2: 24 meq/L (ref 22–29)
Creatinine: 1.7 mg/dL — ABNORMAL HIGH (ref 0.6–1.1)
GLUCOSE: 135 mg/dL (ref 70–140)
Potassium: 4.5 mEq/L (ref 3.5–5.1)
SODIUM: 140 meq/L (ref 136–145)
TOTAL PROTEIN: 7.2 g/dL (ref 6.4–8.3)
Total Bilirubin: 0.55 mg/dL (ref 0.20–1.20)

## 2013-07-21 LAB — HEPARIN ANTI-XA: HEPARIN LMW: 1.04 [IU]/mL

## 2013-07-22 ENCOUNTER — Telehealth: Payer: Self-pay | Admitting: *Deleted

## 2013-07-22 NOTE — Telephone Encounter (Signed)
Per Dr. Beryle Beams; notified pt arixtra level now optimal at 1.04; stay on current dose.  Pt verbalized understanding and states she also saw on MyChart.

## 2013-07-22 NOTE — Telephone Encounter (Signed)
Message copied by Domenic Schwab on Tue Jul 22, 2013  1:00 PM ------      Message from: Annia Belt      Created: Mon Jul 21, 2013  7:40 PM       Call Dr Kathyrn Drown - arixtra level now optimal at 1.04: stay on current dose ------

## 2013-07-23 ENCOUNTER — Other Ambulatory Visit: Payer: Self-pay

## 2013-07-23 DIAGNOSIS — D696 Thrombocytopenia, unspecified: Secondary | ICD-10-CM

## 2013-07-23 MED ORDER — FONDAPARINUX SODIUM 5 MG/0.4ML ~~LOC~~ SOLN: 5.0000 mg | SUBCUTANEOUS | Status: DC

## 2013-07-23 NOTE — Telephone Encounter (Signed)
Rx faxed to [facility] after clarification with Arnette Norris the pharmacist there. 4 doses of 0.4 ml (5 mg) = 1.6 ml total Rx. She does not need refill of 2.5 mg at present.

## 2013-07-29 ENCOUNTER — Telehealth: Payer: Self-pay | Admitting: *Deleted

## 2013-07-29 NOTE — Telephone Encounter (Signed)
Pt returning call.  She is due to have OT eval on 08-04-13.  She had 1-2 days transient improvement with BOTOX (from 05-28-14) w/ Dr. Janann Colonel.  She will at this point call back to schedule her next BOTOX appt after her OT appt.

## 2013-07-31 ENCOUNTER — Telehealth: Payer: Self-pay | Admitting: Internal Medicine

## 2013-07-31 NOTE — Telephone Encounter (Signed)
Spoke w/granddaughter--to give message to pt to call/kwm

## 2013-07-31 NOTE — Telephone Encounter (Signed)
New Problem:  Pt is wanting someone to call her and explain/walk her through how a remote transmission works.... Pt is requesting a call back on her home or cell.

## 2013-07-31 NOTE — Telephone Encounter (Signed)
Spoke w/pt to let know remote will automatically be sent that nothing needs to be done/kwm

## 2013-08-01 ENCOUNTER — Ambulatory Visit (INDEPENDENT_AMBULATORY_CARE_PROVIDER_SITE_OTHER): Payer: Medicare Other | Admitting: *Deleted

## 2013-08-01 DIAGNOSIS — I4891 Unspecified atrial fibrillation: Secondary | ICD-10-CM

## 2013-08-01 DIAGNOSIS — I4892 Unspecified atrial flutter: Secondary | ICD-10-CM

## 2013-08-04 ENCOUNTER — Ambulatory Visit: Payer: Medicare Other

## 2013-08-04 ENCOUNTER — Ambulatory Visit: Payer: Medicare Other | Attending: Neurology | Admitting: Occupational Therapy

## 2013-08-04 DIAGNOSIS — R279 Unspecified lack of coordination: Secondary | ICD-10-CM | POA: Insufficient documentation

## 2013-08-04 DIAGNOSIS — G8110 Spastic hemiplegia affecting unspecified side: Secondary | ICD-10-CM | POA: Insufficient documentation

## 2013-08-04 DIAGNOSIS — M629 Disorder of muscle, unspecified: Secondary | ICD-10-CM | POA: Diagnosis not present

## 2013-08-04 DIAGNOSIS — IMO0001 Reserved for inherently not codable concepts without codable children: Secondary | ICD-10-CM | POA: Insufficient documentation

## 2013-08-04 DIAGNOSIS — M2420 Disorder of ligament, unspecified site: Secondary | ICD-10-CM | POA: Insufficient documentation

## 2013-08-04 DIAGNOSIS — H2510 Age-related nuclear cataract, unspecified eye: Secondary | ICD-10-CM | POA: Diagnosis not present

## 2013-08-11 ENCOUNTER — Ambulatory Visit: Payer: Medicare Other | Admitting: Occupational Therapy

## 2013-08-13 ENCOUNTER — Encounter: Payer: Self-pay | Admitting: *Deleted

## 2013-08-13 ENCOUNTER — Encounter: Payer: Medicare Other | Admitting: Occupational Therapy

## 2013-08-13 LAB — MDC_IDC_ENUM_SESS_TYPE_REMOTE
Battery Voltage: 2.93 V
Implantable Pulse Generator Model: 3210
Implantable Pulse Generator Serial Number: 2786353
Lead Channel Impedance Value: 980 Ohm
Lead Channel Pacing Threshold Amplitude: 0.75 V
Lead Channel Pacing Threshold Pulse Width: 0.5 ms
Lead Channel Pacing Threshold Pulse Width: 0.8 ms
Lead Channel Setting Pacing Amplitude: 1.5 V
Lead Channel Setting Pacing Amplitude: 2 V
Lead Channel Setting Sensing Sensitivity: 2 mV
MDC IDC MSMT LEADCHNL LV PACING THRESHOLD AMPLITUDE: 0.75 V
MDC IDC MSMT LEADCHNL RV IMPEDANCE VALUE: 400 Ohm
MDC IDC MSMT LEADCHNL RV SENSING INTR AMPL: 8.4 mV
MDC IDC SET LEADCHNL LV PACING PULSEWIDTH: 0.8 ms
MDC IDC SET LEADCHNL RV PACING AMPLITUDE: 2 V
MDC IDC SET LEADCHNL RV PACING PULSEWIDTH: 0.5 ms

## 2013-08-22 ENCOUNTER — Encounter: Payer: Self-pay | Admitting: Internal Medicine

## 2013-08-24 ENCOUNTER — Encounter: Payer: Self-pay | Admitting: Oncology

## 2013-08-26 ENCOUNTER — Telehealth: Payer: Self-pay | Admitting: *Deleted

## 2013-08-26 NOTE — Telephone Encounter (Signed)
Called patient to cancel appt and r/s due to physician being out sick. Patient was not able to r/s because she was not at home and will call back tomorrow to schedule an appt.

## 2013-08-27 ENCOUNTER — Ambulatory Visit: Payer: PRIVATE HEALTH INSURANCE | Admitting: Neurology

## 2013-09-03 ENCOUNTER — Encounter: Payer: Self-pay | Admitting: Neurology

## 2013-09-03 ENCOUNTER — Ambulatory Visit (INDEPENDENT_AMBULATORY_CARE_PROVIDER_SITE_OTHER): Payer: Medicare Other | Admitting: Neurology

## 2013-09-03 VITALS — BP 115/79 | HR 109 | Ht 63.5 in | Wt 168.0 lb

## 2013-09-03 DIAGNOSIS — G8110 Spastic hemiplegia affecting unspecified side: Secondary | ICD-10-CM

## 2013-09-03 MED ORDER — ONABOTULINUMTOXINA 100 UNITS IJ SOLR: 100.0000 [IU] | Freq: Once | INTRAMUSCULAR | Status: DC

## 2013-09-03 NOTE — Progress Notes (Signed)
Provider:  Dr Janann Colonel Referring Provider: Josetta Huddle, MD Primary Care Physician:  Henrine Screws, MD  CC:  Post stroke spasticity  HPI:  [name] is a 68 y.o. female here for repeat Botox injections for post stroke spasticity. Last injections were on 05/28/2013. At that time she received a total of 25 units of Botox. Noted minimal initial benefit but no sustained relief. Continues to have flexion of the PIP and DIP, worst in the 2nd and 3rd digit on the right.    Contraindications and precautions discussed with patient. EMG guidance was used to inject muscles detailed below. Aseptic procedure was observed and patient tolerated procedure. Procedure performed by Dr. Jim Like.  The condition has existed for more than 6 months, and pt does not have a diagnosis of ALS, Myasthenia Gravis or Lambert-Eaton Syndrome.  Risks and benefits of injections discussed and pt agrees to proceed with the procedure.  Written consent obtained These injections are medically necessary.  These injections do not cause sedations or hallucinations which the oral therapies may cause.  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 Marital Status: Married    Spouse Name: Alease Medina     Number of Children: 0  . Years of Education: college   Occupational History  . PHYSICIAN     retired   Social History Main Topics  . Smoking status: Former Smoker -- 0.10 packs/day for 6 years    Types: Cigarettes  . Smokeless tobacco: Never Used     Comment: 03/28/2012 "smoked in the 1970's"  . Alcohol Use: 0.0 oz/week     Comment: 03/28/2012 "very rare glass of wine"  . Drug Use: No  . Sexual Activity: Yes   Other Topics Concern  . 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 Lives at home with  her husband.  Quit smoking 25 years ago and quit alcohol 2 years ago.   Denies alcohol, caffeine,  and illicit drug use.      Family History  Problem Relation Age of Onset  . Heart disease Father     late 73's early 80's-CABG  . Dementia Father   . Diabetes Father     Past Medical History  Diagnosis Date  . Rheumatic heart disease 1995    post percutaneous valvuloplasty at [facility] with subsequent mechanical mitral valve replacement and tricuspid annuloplasty at [facility]  . Subdural hematoma 2009    on coumadin  . Heparin-induced thrombocytopenia   . HTN (hypertension)   . History of Hashimoto thyroiditis   . HLD (hyperlipidemia)   . Atrial flutter     atypical right atrial flutter s/p ablation at [facility] by Dr Clyda Hurdle  . 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 Pacemaker   . Antiphospholipid antibody syndrome   . Anemia   . Heart murmur   . CHF (congestive heart failure)   . Hypothyroidism   . History of blood transfusion     "lots; most in 2007 w/MVR OR"  . Cerebrovascular accident ?1990; 1993; 2007    residual "can't use my right hand; left visual field cut" (03/28/2012)  . Arthritis     "may have some" (03/28/2012)  . Chronic kidney disease     "as a  result of my heart failing" (03/28/2012)  . Fine motor impairment     "fingers right hand" (03/28/2012)    Past Surgical History  Procedure Laterality Date  . Mitral valve replacement  2007    with tricuspid annuloplasty  . Amputation      left atrial appendage amputated with MVR  . Craniotomy  2008    for SDH  . Cholecystectomy    . Tonsillectomy    . Percutaneous balloon valvuloplasty  1995    At [facility] with subsequent mechanical mitral valve replacement and tricuspid annuloplasty at [facility]  . Atrial ablation surgery      CTI and R atriostomy scar flutter ablation at [facility] 8/12,  repeat  ablation at [facility]  9/12  . Cardiac valve replacement    . Tonsillectomy      "when I was  a child"  . Total abdominal hysterectomy  ?1995    TAH; BSO  . Biventricular pacemaker placement  03/28/2012    SJM Anthem implanted by Dr Lovena Le  . Cardioversion  2010-2011    "3 at [facility]" (03/28/2012)  . Cardiac electrophysiology mapping and ablation  2012    "2 @ [facility]" (03/28/2012    Current Outpatient Prescriptions  Medication Sig Dispense Refill  . acetaminophen (TYLENOL) 650 MG CR tablet Take 650 mg by mouth as needed.       . AMOXICILLIN PO Take 4 capsules by mouth as directed. ONLY WHEN VISITING DENTIST      . Calcium-Magnesium (CAL-MAG PO) Take 1 tablet by mouth daily.       . Cholecalciferol (VITAMIN D) 2000 UNITS tablet Take 2,000 Units by mouth daily.        Marland Kitchen diltiazem (CARDIZEM CD) 180 MG 24 hr capsule Take 2 by mouth daily  180 capsule  3  . Estradiol Acetate (FEMRING) 0.05 MG/24HR RING Place 1 each vaginally as directed.       . fish oil-omega-3 fatty acids 1000 MG capsule Take 1 g by mouth daily.       . fondaparinux (ARIXTRA) 2.5 MG/0.5ML SOLN injection Inject 0.5 mLs (2.5 mg total) into the skin every other day.  3.5 mL  6  . fondaparinux (ARIXTRA) 5 MG/0.4ML SOLN injection Inject 0.4 mLs (5 mg total) into the skin daily. Use as directed  1.6 mL  11  . levothyroxine (SYNTHROID, LEVOTHROID) 75 MCG tablet Take 75 mcg by mouth daily.        Marland Kitchen lisinopril (PRINIVIL,ZESTRIL) 5 MG tablet Take 2.5 mg by mouth 2 (two) times daily.       . mupirocin ointment (BACTROBAN) 2 % Place 1 application into the nose daily. Uses in nares at hs      . rosuvastatin (CRESTOR) 5 MG tablet Take 5 mg by mouth daily.      . valACYclovir (VALTREX) 1000 MG tablet Take 1,000 mg by mouth 2 (two) times daily. For a total of 3 doses for labial herpes prn only       Current Facility-Administered Medications  Medication Dose Route Frequency Provider Last Rate Last Dose  . botulinum toxin Type A (BOTOX) injection 25 Units  25 Units Intramuscular Once Hulen Luster, DO      . botulinum toxin Type A  (BOTOX) injection 40 Units  40 Units Intramuscular Once Hulen Luster, DO        Allergies as of 09/03/2013 - Review Complete 09/03/2013  Allergen Reaction Noted  . Amiodarone hcl Other (See Comments)   .  Heparin Other (See Comments)   . Vitamin k Anaphylaxis   . Iodinated diagnostic agents Itching 10/10/2010    Vitals: BP 115/79  Pulse 109  Ht 5' 3.5" (1.613 m)  Wt 168 lb (76.204 kg)  BMI 29.29 kg/m2 Last Weight:  Wt Readings from Last 1 Encounters:  09/03/13 168 lb (76.204 kg)   Last Height:   Ht Readings from Last 1 Encounters:  09/03/13 5' 3.5" (1.613 m)    [name] is a pleasant 68y/o with L sided spastic hemi-paresis secondary to a prior CVA who presents for initial Botox injections.    1) Botox injections as detailed above. A total of 30 units and 0 units wasted used in 1:1 dilution with injections to the L FDS and L FDP 2) Tylenol or Motrin for injection site pain. 3) Medication guide dispensed. 4) Follow up for repeat injections in 3 months -benefits and risks discussed with patient. consent was obtained. possible side effects were discussed

## 2013-09-08 ENCOUNTER — Telehealth: Payer: Self-pay | Admitting: *Deleted

## 2013-09-08 ENCOUNTER — Other Ambulatory Visit (HOSPITAL_BASED_OUTPATIENT_CLINIC_OR_DEPARTMENT_OTHER): Payer: Medicare Other

## 2013-09-08 DIAGNOSIS — D6861 Antiphospholipid syndrome: Secondary | ICD-10-CM

## 2013-09-08 DIAGNOSIS — D6859 Other primary thrombophilia: Secondary | ICD-10-CM

## 2013-09-08 DIAGNOSIS — Z7901 Long term (current) use of anticoagulants: Secondary | ICD-10-CM | POA: Diagnosis not present

## 2013-09-08 DIAGNOSIS — D696 Thrombocytopenia, unspecified: Secondary | ICD-10-CM

## 2013-09-08 LAB — CBC WITH DIFFERENTIAL/PLATELET
BASO%: 1.3 % (ref 0.0–2.0)
Basophils Absolute: 0.1 10*3/uL (ref 0.0–0.1)
EOS%: 3.4 % (ref 0.0–7.0)
Eosinophils Absolute: 0.2 10*3/uL (ref 0.0–0.5)
HCT: 33.2 % — ABNORMAL LOW (ref 34.8–46.6)
HEMOGLOBIN: 10.9 g/dL — AB (ref 11.6–15.9)
LYMPH%: 17.1 % (ref 14.0–49.7)
MCH: 30.7 pg (ref 25.1–34.0)
MCHC: 32.8 g/dL (ref 31.5–36.0)
MCV: 93.5 fL (ref 79.5–101.0)
MONO#: 0.3 10*3/uL (ref 0.1–0.9)
MONO%: 5.8 % (ref 0.0–14.0)
NEUT#: 3.8 10*3/uL (ref 1.5–6.5)
NEUT%: 72.4 % (ref 38.4–76.8)
Platelets: 93 10*3/uL — ABNORMAL LOW (ref 145–400)
RBC: 3.55 10*6/uL — ABNORMAL LOW (ref 3.70–5.45)
RDW: 13.2 % (ref 11.2–14.5)
WBC: 5.2 10*3/uL (ref 3.9–10.3)
lymph#: 0.9 10*3/uL (ref 0.9–3.3)

## 2013-09-08 LAB — COMPREHENSIVE METABOLIC PANEL (CC13)
ALBUMIN: 4.1 g/dL (ref 3.5–5.0)
ALT: 16 U/L (ref 0–55)
AST: 22 U/L (ref 5–34)
Alkaline Phosphatase: 88 U/L (ref 40–150)
Anion Gap: 9 mEq/L (ref 3–11)
BUN: 35.1 mg/dL — ABNORMAL HIGH (ref 7.0–26.0)
CHLORIDE: 108 meq/L (ref 98–109)
CO2: 22 meq/L (ref 22–29)
Calcium: 9.5 mg/dL (ref 8.4–10.4)
Creatinine: 1.6 mg/dL — ABNORMAL HIGH (ref 0.6–1.1)
Glucose: 82 mg/dl (ref 70–140)
POTASSIUM: 4.8 meq/L (ref 3.5–5.1)
Sodium: 139 mEq/L (ref 136–145)
TOTAL PROTEIN: 7.4 g/dL (ref 6.4–8.3)
Total Bilirubin: 0.58 mg/dL (ref 0.20–1.20)

## 2013-09-08 LAB — HEPARIN ANTI-XA: Heparin LMW: 0.82 IU/mL

## 2013-09-08 NOTE — Telephone Encounter (Signed)
Pt left vm stating that lab done today was a trough level & states she took last big dose on Sun.  Reported to Dr Beryle Beams.

## 2013-09-09 ENCOUNTER — Other Ambulatory Visit: Payer: Self-pay | Admitting: *Deleted

## 2013-09-09 DIAGNOSIS — D696 Thrombocytopenia, unspecified: Secondary | ICD-10-CM

## 2013-09-09 NOTE — Progress Notes (Signed)
Patient aware of appt. - jm

## 2013-09-10 ENCOUNTER — Telehealth: Payer: Self-pay | Admitting: Oncology

## 2013-09-10 NOTE — Telephone Encounter (Signed)
, °

## 2013-09-11 ENCOUNTER — Other Ambulatory Visit (HOSPITAL_BASED_OUTPATIENT_CLINIC_OR_DEPARTMENT_OTHER): Payer: Medicare Other

## 2013-09-11 DIAGNOSIS — D696 Thrombocytopenia, unspecified: Secondary | ICD-10-CM

## 2013-09-11 LAB — HEPARIN ANTI-XA: Heparin LMW: 0.99 IU/mL

## 2013-09-17 ENCOUNTER — Ambulatory Visit: Payer: Medicare Other | Attending: Neurology | Admitting: Occupational Therapy

## 2013-09-17 DIAGNOSIS — IMO0001 Reserved for inherently not codable concepts without codable children: Secondary | ICD-10-CM | POA: Diagnosis not present

## 2013-09-17 DIAGNOSIS — M2420 Disorder of ligament, unspecified site: Secondary | ICD-10-CM | POA: Diagnosis not present

## 2013-09-17 DIAGNOSIS — R279 Unspecified lack of coordination: Secondary | ICD-10-CM | POA: Insufficient documentation

## 2013-09-17 DIAGNOSIS — M629 Disorder of muscle, unspecified: Secondary | ICD-10-CM | POA: Diagnosis not present

## 2013-09-17 DIAGNOSIS — G8110 Spastic hemiplegia affecting unspecified side: Secondary | ICD-10-CM | POA: Insufficient documentation

## 2013-09-22 DIAGNOSIS — R609 Edema, unspecified: Secondary | ICD-10-CM | POA: Diagnosis not present

## 2013-09-22 DIAGNOSIS — R040 Epistaxis: Secondary | ICD-10-CM | POA: Diagnosis not present

## 2013-09-24 ENCOUNTER — Ambulatory Visit: Payer: Medicare Other | Attending: Neurology | Admitting: Occupational Therapy

## 2013-09-24 DIAGNOSIS — M629 Disorder of muscle, unspecified: Secondary | ICD-10-CM | POA: Diagnosis not present

## 2013-09-24 DIAGNOSIS — IMO0001 Reserved for inherently not codable concepts without codable children: Secondary | ICD-10-CM | POA: Diagnosis not present

## 2013-09-24 DIAGNOSIS — R279 Unspecified lack of coordination: Secondary | ICD-10-CM | POA: Diagnosis not present

## 2013-09-24 DIAGNOSIS — G8110 Spastic hemiplegia affecting unspecified side: Secondary | ICD-10-CM | POA: Diagnosis not present

## 2013-09-24 DIAGNOSIS — M2420 Disorder of ligament, unspecified site: Secondary | ICD-10-CM | POA: Insufficient documentation

## 2013-10-06 ENCOUNTER — Other Ambulatory Visit (HOSPITAL_COMMUNITY): Payer: Self-pay | Admitting: Oncology

## 2013-10-06 ENCOUNTER — Ambulatory Visit (INDEPENDENT_AMBULATORY_CARE_PROVIDER_SITE_OTHER): Payer: Medicare Other | Admitting: Oncology

## 2013-10-06 ENCOUNTER — Ambulatory Visit (HOSPITAL_COMMUNITY)
Admission: RE | Admit: 2013-10-06 | Discharge: 2013-10-06 | Disposition: A | Discharge status: Home / Self Care | Payer: Medicare Other | Source: Ambulatory Visit | Attending: Oncology | Admitting: Oncology

## 2013-10-06 ENCOUNTER — Encounter: Payer: Self-pay | Admitting: Oncology

## 2013-10-06 VITALS — BP 107/71 | HR 80 | Temp 97.9°F | Ht 63.0 in | Wt 163.6 lb

## 2013-10-06 DIAGNOSIS — R609 Edema, unspecified: Secondary | ICD-10-CM

## 2013-10-06 DIAGNOSIS — M7989 Other specified soft tissue disorders: Secondary | ICD-10-CM | POA: Insufficient documentation

## 2013-10-06 DIAGNOSIS — M79609 Pain in unspecified limb: Secondary | ICD-10-CM

## 2013-10-06 DIAGNOSIS — D696 Thrombocytopenia, unspecified: Secondary | ICD-10-CM

## 2013-10-06 DIAGNOSIS — D6859 Other primary thrombophilia: Secondary | ICD-10-CM

## 2013-10-06 DIAGNOSIS — I4891 Unspecified atrial fibrillation: Secondary | ICD-10-CM

## 2013-10-06 DIAGNOSIS — I059 Rheumatic mitral valve disease, unspecified: Secondary | ICD-10-CM

## 2013-10-06 DIAGNOSIS — I129 Hypertensive chronic kidney disease with stage 1 through stage 4 chronic kidney disease, or unspecified chronic kidney disease: Secondary | ICD-10-CM

## 2013-10-06 DIAGNOSIS — N189 Chronic kidney disease, unspecified: Secondary | ICD-10-CM | POA: Diagnosis not present

## 2013-10-06 HISTORY — DX: Other specified soft tissue disorders: M79.89

## 2013-10-06 NOTE — Progress Notes (Signed)
*  PRELIMINARY RESULTS* Vascular Ultrasound Right lower extremity venous duplex has been completed.  Preliminary findings: Right:  No evidence of DVT, superficial thrombosis, or Baker's cyst.  Called report to Dr. Beryle Beams.   Chapman Fitch RVT 10/06/2013, 1:55 PM

## 2013-10-06 NOTE — Patient Instructions (Signed)
Venous doppler study right leg today at [facility] Continue labs every other month at [facility] with DrG 6 months: October 2015

## 2013-10-06 NOTE — Progress Notes (Signed)
Patient ID: [name], female   DOB: 09-Feb-1946, 68 y.o.   MRN: [id number] Hematology and Oncology Follow Up Visit  [name] [id number] [birth year] 68 y.o. 10/06/2013 5:27 PM   Principle Diagnosis: Encounter Diagnosis  Name Primary?  . 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 She has had recurrent thrombotic events including an episode of amaurosis fugax and a TIA in the past. She has chronic moderate thrombocytopenia. She developed advanced mitral valve disease and had to undergo an emergency mitral valve replacement when she went into acute congestive heart failure with a concomitant DIC picture. She had a mechanical mitral valve replacement. 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 She developed chronic renal insufficiency.  She suffered a subdural hematoma requiring emergency evacuation while on Coumadin. Attempts to go back on Coumadin resulted in a recurrent subdural. She had an anaphylactic reaction to vitamin K in attempt to reverse the Coumadin at time of the second intracranial bleed. 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  She has been maintained on Arixtra injections 2.5 mg every other day despite her chronic renal dysfunction in view of the Coumadin related hemorrhages in the history of heparin-induced thrombocytopenia  .  Despite the mitral valve replacement, she has had refractory atrial arrhythmias. She has required DC cardioversion and a number of attempts at radiofrequency ablation. Most recently she had to have a pacemaker placed. Her cardiologist told her that she is still in atrial fibrillation.   She is currently doing well . She is not having any chest pain or  palpitations. She is exercising on a regular basis.   Peak levels of Arixtra had been running low despite stable renal function. I made a dose adjustment. 5 mg once weekly otherwise 2.5 mg every other day. Peak levels now better in the range of 0.82-1.04 over the last 3 months. She has had no obvious thrombotic events. She remains concerned with asymmetric edema of her right calf. No pain. Intermittent rash. Chronic superficial venous distention. Objective measurements of calf and ankle diameter were normal and symmetric at time of her 06/10/2013 visit here. Subjectively, the right calf does look more distended to me today. See below.    Medications: reviewed  Allergies:  Allergies  Allergen Reactions  . Amiodarone Hcl Other (See Comments)    "torsades; v tach"  . 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 Vitamin K Anaphylaxis    IV only allergy  . Iodinated Diagnostic Agents Itching    "over 35 years ago" (03/28/2012)    Review of Systems: Hematology:  No bleeding or bruising ENT ROS: No sore throat Breast ROS:  Respiratory ROS: No cough or dyspnea Cardiovascular ROS:  No chest pain or palpitations. No edema. Gastrointestinal ROS: No abdominal pain or change in bowel habit    Genito-Urinary ROS: Not question Musculoskeletal ROS: No muscle bone or joint pain Neurological ROS: No headache or change in vision. No new focal weakness. She just started getting some Botox injections to decrease spasticity in her hands. Dermatological ROS: Intermittent rash right tibial area Remaining ROS negative:   Physical Exam: Blood pressure 107/71, pulse 80, temperature 97.9 F (36.6 C), temperature source Oral, height 5\' 3"  (1.6 m), weight 163 lb 9.6 oz (74.208 kg),  SpO2 98.00%. Wt Readings from Last 3 Encounters:  10/06/13 163 lb 9.6 oz (74.208 kg)  09/03/13 168 lb (76.204 kg)  06/09/13 163 lb 3.2 oz (74.027 kg)     General appearance: Well-nourished Caucasian woman HENNT: Pharynx no  erythema, exudate, mass, or ulcer. No thyromegaly or thyroid nodules Lymph nodes: No cervical, supraclavicular, or axillary lymphadenopathy Breasts: Lungs: Clear to auscultation, resonant to percussion throughout Heart: Regular rhythm, mechanical heart valve sounds, no murmur, no gallop, no rub,  no edema Abdomen: Soft, nontender, normal bowel sounds, no mass, no organomegaly Extremities: No edema, no calf tenderness, superficial venous distention both anterior tibial areas, calf diameter subjectively mildly larger right compared with left. Recent objective measurements were similar on both calves. Musculoskeletal: no joint deformities GU:  Vascular: Carotid pulses 2+, no bruits, distal pulses: Neurologic: Alert, oriented, PERRLA, optic discs sharp and vessels normal, no hemorrhage or exudate, cranial nerves grossly normal, motor strength 5 over 5, reflexes 1+ symmetric, upper body coordination normal, gait normal, Skin: Serpiginous, macular, rash over about 10 cm right tibial area  Lab Results: CBC W/Diff    Component Value Date/Time   WBC 5.2 09/08/2013 1328   WBC 4.6 03/19/2012 1652   RBC 3.55* 09/08/2013 1328   RBC 3.45* 03/19/2012 1652   RBC 2.78* 07/25/2007 1330   HGB 10.9* 09/08/2013 1328   HGB 10.8* 03/19/2012 1652   HCT 33.2* 09/08/2013 1328   HCT 32.8* 03/19/2012 1652   PLT 93* 09/08/2013 1328   PLT 82.0* 03/19/2012 1652   MCV 93.5 09/08/2013 1328   MCV 94.9 03/19/2012 1652   MCH 30.7 09/08/2013 1328   MCH 32.0 07/04/2010 1458   MCHC 32.8 09/08/2013 1328   MCHC 33.0 03/19/2012 1652   RDW 13.2 09/08/2013 1328   RDW 12.4 03/19/2012 1652   LYMPHSABS 0.9 09/08/2013 1328   LYMPHSABS 1.2 03/19/2012 1652   MONOABS 0.3 09/08/2013 1328   MONOABS 0.3 03/19/2012 1652   EOSABS 0.2 09/08/2013 1328   EOSABS 0.2 03/19/2012 1652   BASOSABS 0.1 09/08/2013 1328   BASOSABS 0.0 03/19/2012 1652     Chemistry      Component Value Date/Time   NA 139 09/08/2013 1329   NA 140 04/08/2012 1102   K 4.8 09/08/2013  1329   K 4.6 04/08/2012 1102   CL 108* 11/04/2012 1158   CL 108 04/08/2012 1102   CO2 22 09/08/2013 1329   CO2 24 04/08/2012 1102   BUN 35.1* 09/08/2013 1329   BUN 46* 04/08/2012 1102   CREATININE 1.6* 09/08/2013 1329   CREATININE 1.7* 04/08/2012 1102      Component Value Date/Time   CALCIUM 9.5 09/08/2013 1329   CALCIUM 8.3* 04/08/2012 1102   ALKPHOS 88 09/08/2013 1329   ALKPHOS 61 02/05/2012 1042   AST 22 09/08/2013 1329   AST 24 02/05/2012 1042   ALT 16 09/08/2013 1329   ALT 18 02/05/2012 1042   BILITOT 0.58 09/08/2013 1329   BILITOT 0.5 02/05/2012 1042       Radiological Studies: Venous Doppler study right lower extremity shows no acute or chronic clot   Impression:  #1. Antiphospholipid antibody syndrome status post multiple thrombotic events  She remains stable on Arixtra anticoagulation.  Plan: Continue long-term anticoagulation.  Target Arixtra level 0.8-1.1. She currently takes a single additional 5 mg dose per week and otherwise stay on the 2.5 mg every other day  She was on low-dose aspirin in the past as well and his Coumadin. I'm not quite sure why  we stopped the aspirin and I will need to discuss this with Dr. Kathyrn Drown.  #2. Chronic mild thrombocytopenia secondary to #1   #3. Chronic renal insufficiency likely due to perioperative complications at time of mitral valve replacement in 2007.   #4. Mitral valve disease likely secondary to #1. Status post mechanical valve replacement 2007   #5. Atrial fibrillation/flutter- now status post pacemaker implantation having failed DC cardioversion and a radiofrequency ablation procedure.   #6. Essential hypertension   #7. Hypothyroid on replacement  #8. Mild asymmetric edema right calf No obvious deep venous thrombosis on venous Doppler study today. I think what we are primarily seeing is some venous stasis changes. There may also be some chronic inflammatory changes. The rash on her tibial area today appears vasculitic although  it is not raised. We will continue to monitor. She will call for any changes.      CC: Patient Care Team: Josetta Huddle, MD as PCP - General (Internal Medicine) Annia Belt, MD as Consulting Physician (Oncology) Lucrezia Starch, MD as Consulting Physician (Nephrology) Lelon Perla, MD as Consulting Physician (Cardiology) Thompson Grayer, MD as Consulting Physician (Cardiology)   Annia Belt, MD 4/13/20155:27 PM

## 2013-10-13 ENCOUNTER — Telehealth: Payer: Self-pay | Admitting: Neurology

## 2013-10-13 NOTE — Telephone Encounter (Signed)
Called pt to schedule June injections she states there has been no effect and she will not continue to have them.

## 2013-10-23 DIAGNOSIS — H2589 Other age-related cataract: Secondary | ICD-10-CM | POA: Diagnosis not present

## 2013-11-04 ENCOUNTER — Telehealth: Payer: Self-pay | Admitting: Oncology

## 2013-11-04 NOTE — Telephone Encounter (Signed)
Gave pt appt for labs until November 2015

## 2013-11-06 ENCOUNTER — Ambulatory Visit (INDEPENDENT_AMBULATORY_CARE_PROVIDER_SITE_OTHER): Payer: Medicare Other | Admitting: *Deleted

## 2013-11-06 DIAGNOSIS — I4891 Unspecified atrial fibrillation: Secondary | ICD-10-CM | POA: Diagnosis not present

## 2013-11-06 DIAGNOSIS — I4892 Unspecified atrial flutter: Secondary | ICD-10-CM | POA: Diagnosis not present

## 2013-11-06 LAB — MDC_IDC_ENUM_SESS_TYPE_REMOTE
Brady Statistic RV Percent Paced: 98 %
Implantable Pulse Generator Model: 3210
Implantable Pulse Generator Serial Number: 2786353
Lead Channel Impedance Value: 380 Ohm
Lead Channel Pacing Threshold Amplitude: 0.75 V
Lead Channel Pacing Threshold Amplitude: 0.75 V
Lead Channel Pacing Threshold Pulse Width: 0.8 ms
Lead Channel Sensing Intrinsic Amplitude: 8.4 mV
Lead Channel Setting Pacing Amplitude: 2 V
Lead Channel Setting Pacing Pulse Width: 0.5 ms
Lead Channel Setting Pacing Pulse Width: 0.8 ms
Lead Channel Setting Sensing Sensitivity: 2 mV
MDC IDC MSMT BATTERY REMAINING LONGEVITY: 65 mo
MDC IDC MSMT BATTERY VOLTAGE: 2.93 V
MDC IDC MSMT LEADCHNL LV IMPEDANCE VALUE: 980 Ohm
MDC IDC MSMT LEADCHNL RV PACING THRESHOLD PULSEWIDTH: 0.5 ms
MDC IDC SESS DTM: 20150514071911
MDC IDC SET LEADCHNL RV PACING AMPLITUDE: 2 V

## 2013-11-06 NOTE — Progress Notes (Signed)
Remote pacemaker transmission.   

## 2013-11-10 ENCOUNTER — Telehealth: Payer: Self-pay | Admitting: *Deleted

## 2013-11-10 ENCOUNTER — Other Ambulatory Visit (HOSPITAL_BASED_OUTPATIENT_CLINIC_OR_DEPARTMENT_OTHER): Payer: Medicare Other

## 2013-11-10 ENCOUNTER — Other Ambulatory Visit: Payer: Self-pay | Admitting: *Deleted

## 2013-11-10 DIAGNOSIS — D6859 Other primary thrombophilia: Secondary | ICD-10-CM

## 2013-11-10 DIAGNOSIS — D6861 Antiphospholipid syndrome: Secondary | ICD-10-CM

## 2013-11-10 DIAGNOSIS — D696 Thrombocytopenia, unspecified: Secondary | ICD-10-CM | POA: Diagnosis not present

## 2013-11-10 DIAGNOSIS — Z7901 Long term (current) use of anticoagulants: Secondary | ICD-10-CM | POA: Diagnosis not present

## 2013-11-10 LAB — CBC WITH DIFFERENTIAL/PLATELET
BASO%: 1.7 % (ref 0.0–2.0)
Basophils Absolute: 0.1 10*3/uL (ref 0.0–0.1)
EOS ABS: 0.2 10*3/uL (ref 0.0–0.5)
EOS%: 4.4 % (ref 0.0–7.0)
HCT: 30.6 % — ABNORMAL LOW (ref 34.8–46.6)
HGB: 10 g/dL — ABNORMAL LOW (ref 11.6–15.9)
LYMPH%: 19.2 % (ref 14.0–49.7)
MCH: 30.7 pg (ref 25.1–34.0)
MCHC: 32.7 g/dL (ref 31.5–36.0)
MCV: 93.9 fL (ref 79.5–101.0)
MONO#: 0.3 10*3/uL (ref 0.1–0.9)
MONO%: 6.6 % (ref 0.0–14.0)
NEUT%: 68.1 % (ref 38.4–76.8)
NEUTROS ABS: 3 10*3/uL (ref 1.5–6.5)
PLATELETS: 95 10*3/uL — AB (ref 145–400)
RBC: 3.26 10*6/uL — ABNORMAL LOW (ref 3.70–5.45)
RDW: 13.5 % (ref 11.2–14.5)
WBC: 4.4 10*3/uL (ref 3.9–10.3)
lymph#: 0.8 10*3/uL — ABNORMAL LOW (ref 0.9–3.3)

## 2013-11-10 LAB — COMPREHENSIVE METABOLIC PANEL (CC13)
ALBUMIN: 3.8 g/dL (ref 3.5–5.0)
ALK PHOS: 75 U/L (ref 40–150)
ALT: 17 U/L (ref 0–55)
ANION GAP: 10 meq/L (ref 3–11)
AST: 25 U/L (ref 5–34)
BUN: 26.6 mg/dL — AB (ref 7.0–26.0)
CO2: 20 meq/L — AB (ref 22–29)
Calcium: 9.1 mg/dL (ref 8.4–10.4)
Chloride: 110 mEq/L — ABNORMAL HIGH (ref 98–109)
Creatinine: 1.6 mg/dL — ABNORMAL HIGH (ref 0.6–1.1)
GLUCOSE: 81 mg/dL (ref 70–140)
Potassium: 4.7 mEq/L (ref 3.5–5.1)
Sodium: 140 mEq/L (ref 136–145)
Total Bilirubin: 0.55 mg/dL (ref 0.20–1.20)
Total Protein: 7 g/dL (ref 6.4–8.3)

## 2013-11-10 LAB — HEPARIN ANTI-XA: HEPARIN LMW: 1.1 [IU]/mL

## 2013-11-10 MED ORDER — FONDAPARINUX SODIUM 2.5 MG/0.5ML ~~LOC~~ SOLN
2.5000 mg | SUBCUTANEOUS | Status: DC
Start: 2013-11-10 — End: 2013-11-14

## 2013-11-10 MED ORDER — FONDAPARINUX SODIUM 2.5 MG/0.5ML ~~LOC~~ SOLN: 2.5000 mg | SUBCUTANEOUS | Status: DC

## 2013-11-10 NOTE — Telephone Encounter (Signed)
error 

## 2013-11-10 NOTE — Telephone Encounter (Signed)
Called pt - pt informed of lovenox level 1.1-stay on current dose; creat level of 1.6 and platelet count of 95,000 -both stable per Dr Beryle Beams. Pt voiced understanding.

## 2013-11-10 NOTE — Telephone Encounter (Signed)
Call from [employer] 5mg  is no longer available from the mfg.So at least 1 day a week, pt will need two of 2.5mg  (total of 5.0 mg) injections. Need new refill rx for qty of 19 syringes for 1 month supply.

## 2013-11-10 NOTE — Telephone Encounter (Signed)
Message copied by [name] on Mon Nov 10, 2013  3:49 PM ------      Message from: Annia Belt      Created: Mon Nov 10, 2013  2:02 PM       Call pt: lovenox level 1.1 =  Good;  stay on current dose.      Creatinine 1.6 = stable      Platelet count 95,000 = stable ------

## 2013-11-14 ENCOUNTER — Encounter: Payer: Self-pay | Admitting: Cardiology

## 2013-11-14 ENCOUNTER — Ambulatory Visit (INDEPENDENT_AMBULATORY_CARE_PROVIDER_SITE_OTHER): Payer: Medicare Other | Admitting: Cardiology

## 2013-11-14 ENCOUNTER — Encounter: Payer: Self-pay | Admitting: *Deleted

## 2013-11-14 VITALS — BP 104/66 | HR 81 | Ht 63.0 in | Wt 168.0 lb

## 2013-11-14 DIAGNOSIS — I059 Rheumatic mitral valve disease, unspecified: Secondary | ICD-10-CM

## 2013-11-14 DIAGNOSIS — I4891 Unspecified atrial fibrillation: Secondary | ICD-10-CM

## 2013-11-14 DIAGNOSIS — I10 Essential (primary) hypertension: Secondary | ICD-10-CM

## 2013-11-14 DIAGNOSIS — Z9889 Other specified postprocedural states: Secondary | ICD-10-CM

## 2013-11-14 DIAGNOSIS — Z950 Presence of cardiac pacemaker: Secondary | ICD-10-CM

## 2013-11-14 DIAGNOSIS — I5042 Chronic combined systolic (congestive) and diastolic (congestive) heart failure: Secondary | ICD-10-CM

## 2013-11-14 DIAGNOSIS — R609 Edema, unspecified: Secondary | ICD-10-CM | POA: Insufficient documentation

## 2013-11-14 NOTE — Assessment & Plan Note (Signed)
Continue Cardizem. Continue arixtra. Followed by hematology.

## 2013-11-14 NOTE — Assessment & Plan Note (Signed)
Continue SBE prophylaxis. Given lower extremity edema and mild dyspnea we'll repeat echocardiogram to assess LV function and mitral valve replacement.

## 2013-11-14 NOTE — Assessment & Plan Note (Signed)
Blood pressure controlled. Continue present medications. 

## 2013-11-14 NOTE — Assessment & Plan Note (Signed)
She has some increased edema.Etiology unclear. Repeat echocardiogram. If symptoms do not improve we can consider low-dose Lasix as needed. We will need to be careful given baseline renal insufficiency.

## 2013-11-14 NOTE — Assessment & Plan Note (Signed)
Followed by Dr. Rayann Heman.

## 2013-11-14 NOTE — Patient Instructions (Signed)
Your physician recommends that you schedule a follow-up appointment in: 3 MONTHS WITH DR CRENSHAW  Your physician has requested that you have an echocardiogram. Echocardiography is a painless test that uses sound waves to create images of your heart. It provides your doctor with information about the size and shape of your heart and how well your heart's chambers and valves are working. This procedure takes approximately one hour. There are no restrictions for this procedure.    

## 2013-11-14 NOTE — Assessment & Plan Note (Signed)
Plan repeat echocardiogram. 

## 2013-11-14 NOTE — Progress Notes (Signed)
HPI: FU rheumatic heart disease, atrial tachycardia/flutter, diastolic congestive heart failure. The patient had mitral valvuloplasty at [facility] in the late 90s. In 2007 she had a mechanical mitral valve replacement at [facility]. 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 She's had a prior subdural hematoma that required evacuation. She also has a history of heparin-induced thrombocytopenia. She is followed by Dr Rayann Heman for SVT and is s/p ablation at [facility]; also with h/o torsades with amiodarone. Has had previous biventricular pacemaker. Last echocardiogram in April of 2014 showed an ejection fraction of 45-50%, normal appearing mechanical mitral valve with trace mitral regurgitation, moderate left atrial enlargement and mild right atrial enlargement. There was trace aortic insufficiency. Since she was last seen she Has mild dyspnea on exertion. No orthopnea or PND but she has noticed bilateral pedal edema. No chest pain, palpitations or syncope.   Current Outpatient Prescriptions  Medication Sig Dispense Refill  . acetaminophen (TYLENOL) 650 MG CR tablet Take 650 mg by mouth as needed.       . AMOXICILLIN PO Take 4 capsules by mouth as directed. ONLY WHEN VISITING DENTIST      . Calcium-Magnesium (CAL-MAG PO) Take 1 tablet by mouth daily.       . Cholecalciferol (VITAMIN D) 2000 UNITS tablet Take 2,000 Units by mouth daily.        Marland Kitchen diltiazem (CARDIZEM CD) 180 MG 24 hr capsule Take 2 by mouth daily  180 capsule  3  . Estradiol Acetate (FEMRING) 0.05 MG/24HR RING Place 1 each vaginally as directed.       . fish oil-omega-3 fatty acids 1000 MG capsule Take 1 g by mouth daily.       . fondaparinux (ARIXTRA) 5 MG/0.4ML SOLN injection Inject 0.4 mLs (5 mg total) into the skin daily. Use as directed  1.6 mL  11  . levothyroxine (SYNTHROID, LEVOTHROID) 75 MCG tablet Take 75 mcg by mouth  daily.        Marland Kitchen lisinopril (PRINIVIL,ZESTRIL) 5 MG tablet Take 2.5 mg by mouth 2 (two) times daily.       . rosuvastatin (CRESTOR) 5 MG tablet Take 5 mg by mouth daily.      . valACYclovir (VALTREX) 1000 MG tablet Take 1,000 mg by mouth 2 (two) times daily. For a total of 3 doses for labial herpes prn only       Current Facility-Administered Medications  Medication Dose Route Frequency Provider Last Rate Last Dose  . botulinum toxin Type A (BOTOX) injection 100 Units  100 Units Intramuscular Once Hulen Luster, DO      . botulinum toxin Type A (BOTOX) injection 25 Units  25 Units Intramuscular Once Hulen Luster, DO      . botulinum toxin Type A (BOTOX) injection 40 Units  40 Units Intramuscular Once Hulen Luster, DO         Past Medical History  Diagnosis Date  . Rheumatic heart disease 1995    post percutaneous valvuloplasty at [facility] with subsequent mechanical mitral valve replacement and tricuspid annuloplasty at [facility]  . Subdural hematoma 2009    on coumadin  . Heparin-induced thrombocytopenia   . HTN (hypertension)   . History of Hashimoto thyroiditis   . HLD (hyperlipidemia)   . Atrial flutter     atypical right atrial flutter s/p ablation at [facility] by  Dr Clyda Hurdle  . 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 Pacemaker   . Antiphospholipid antibody syndrome   . Anemia   . Heart murmur   . CHF (congestive heart failure)   . Hypothyroidism   . History of blood transfusion     "lots; most in 2007 w/MVR OR"  . Cerebrovascular accident ?1990; 1993; 2007    residual "can't use my right hand; left visual field cut" (03/28/2012)  . Arthritis     "may have some" (03/28/2012)  . Chronic kidney disease     "as a result of my heart failing" (03/28/2012)  . Fine motor impairment     "fingers right hand" (03/28/2012)  . Right leg swelling 10/06/2013    Asymmetric; no calf pain, venous doppler negative for DVT 10/06/13    Past Surgical  History  Procedure Laterality Date  . Mitral valve replacement  2007    with tricuspid annuloplasty  . Amputation      left atrial appendage amputated with MVR  . Craniotomy  2008    for SDH  . Cholecystectomy    . Tonsillectomy    . Percutaneous balloon valvuloplasty  1995    At [facility] with subsequent mechanical mitral valve replacement and tricuspid annuloplasty at [facility]  . Atrial ablation surgery      CTI and R atriostomy scar flutter ablation at [facility] 8/12,  repeat  ablation at [facility]  9/12  . Cardiac valve replacement    . Tonsillectomy      "when I was a child"  . Total abdominal hysterectomy  ?1995    TAH; BSO  . Biventricular pacemaker placement  03/28/2012    SJM Anthem implanted by Dr Lovena Le  . Cardioversion  2010-2011    "3 at [facility]" (03/28/2012)  . Cardiac electrophysiology mapping and ablation  2012    "2 @ [facility]" (03/28/2012    History   Social History  . Marital Status: Married    Spouse Name: Alease Medina     Number of Children: 0  . Years of Education: college   Occupational History  . PHYSICIAN     retired   Social History Main Topics  . Smoking status: Former Smoker -- 0.10 packs/day for 6 years    Types: Cigarettes  . Smokeless tobacco: Never Used     Comment: 03/28/2012 "smoked in the 1970's"  . Alcohol Use: 0.0 oz/week     Comment: wine rarely.  . Drug Use: No  . Sexual Activity: Not on file   Other Topics Concern  . 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  Quit smoking 25 years ago and quit alcohol 2 years ago.   Denies alcohol, caffeine,  and illicit drug use.      ROS: no fevers or chills, productive cough, hemoptysis, dysphasia, odynophagia, melena, hematochezia, dysuria, hematuria, rash, seizure activity, orthopnea, PND, pedal edema, claudication. Remaining systems are negative.  Physical Exam: Well-developed well-nourished in no acute distress.  Skin  is warm and dry.  HEENT is normal.  Neck is supple.  Chest is clear to auscultation with normal expansion.  Cardiovascular exam is regular rate and rhythm. Christa mechanical bowel sounds Abdominal exam nontender or distended. No masses palpated. Extremities show 1+ edema. neuro grossly intact  ECG Probable ventricular pacing with underlying atrial fibrillation.

## 2013-12-03 ENCOUNTER — Telehealth: Payer: Self-pay | Admitting: Cardiology

## 2013-12-03 ENCOUNTER — Ambulatory Visit (HOSPITAL_COMMUNITY): Payer: Medicare Other | Attending: Cardiology | Admitting: Radiology

## 2013-12-03 DIAGNOSIS — I4891 Unspecified atrial fibrillation: Secondary | ICD-10-CM | POA: Diagnosis not present

## 2013-12-03 DIAGNOSIS — R609 Edema, unspecified: Secondary | ICD-10-CM

## 2013-12-03 DIAGNOSIS — I059 Rheumatic mitral valve disease, unspecified: Secondary | ICD-10-CM | POA: Diagnosis not present

## 2013-12-03 DIAGNOSIS — I509 Heart failure, unspecified: Secondary | ICD-10-CM

## 2013-12-03 NOTE — Progress Notes (Signed)
Echocardiogram performed.  

## 2013-12-03 NOTE — Telephone Encounter (Signed)
Returned call to patient she stated she wanted Dr.Crenshaw's nurse to know to call echo results to her home #.She also stated Dr.Crenshaw was going to prescribe a diuretic after he reviewed echo.Message sent to Fredia Beets RN.

## 2013-12-03 NOTE — Telephone Encounter (Signed)
New message     Echo done today at the office .   1. Should a diuretic be added

## 2013-12-05 ENCOUNTER — Encounter: Payer: Self-pay | Admitting: Cardiology

## 2013-12-10 ENCOUNTER — Encounter: Payer: Self-pay | Admitting: Internal Medicine

## 2013-12-10 MED ORDER — FUROSEMIDE 40 MG PO TABS: ORAL_TABLET | ORAL | Status: DC

## 2013-12-10 NOTE — Telephone Encounter (Signed)
Follow up     Returning Debra's call to get echo results

## 2013-12-10 NOTE — Telephone Encounter (Signed)
Lasix 40 mg daily prn and bmet one week  Kirk Ruths    Left message for pt to call

## 2013-12-10 NOTE — Telephone Encounter (Signed)
Spoke with pt, Aware of dr Jacalyn Lefevre recommendations.  Script sent to the pharm and labs ordered. Follow up scheduled

## 2013-12-16 ENCOUNTER — Telehealth: Payer: Self-pay | Admitting: Cardiology

## 2013-12-16 NOTE — Telephone Encounter (Signed)
Left message for pt, okay to exercise as tolerated until seen. Pt is having a bmp tomorrow which will check her creatine.

## 2013-12-16 NOTE — Telephone Encounter (Signed)
New message     Pt got echo results.  She sees Dr Stanford Breed in [place].  Is it ok to push cardio and coming for labs tomorrow--will you be drawing a creatinine?

## 2013-12-17 ENCOUNTER — Other Ambulatory Visit (INDEPENDENT_AMBULATORY_CARE_PROVIDER_SITE_OTHER): Payer: Medicare Other

## 2013-12-17 ENCOUNTER — Telehealth: Payer: Self-pay | Admitting: *Deleted

## 2013-12-17 DIAGNOSIS — R609 Edema, unspecified: Secondary | ICD-10-CM | POA: Diagnosis not present

## 2013-12-17 LAB — BASIC METABOLIC PANEL
BUN: 94 mg/dL — AB (ref 6–23)
CALCIUM: 9.3 mg/dL (ref 8.4–10.5)
CO2: 26 mEq/L (ref 19–32)
CREATININE: 2.4 mg/dL — AB (ref 0.4–1.2)
Chloride: 103 mEq/L (ref 96–112)
GFR: 20.98 mL/min — AB (ref >60.00)
GLUCOSE: 80 mg/dL (ref 70–99)
POTASSIUM: 4.5 meq/L (ref 3.5–5.1)
Sodium: 137 mEq/L (ref 135–145)

## 2013-12-17 NOTE — Telephone Encounter (Signed)
Received call from lab with elevated BUN of 94 and Cr 2.4. Discussed with Dr Radford Pax and will have patient hold Lasix and recheck BMET Friday.  Left message to call back

## 2013-12-17 NOTE — Telephone Encounter (Signed)
Will forward to Luisa Dago RN  Notes Recorded by Earvin Hansen on 12/17/2013 at 6:28 PM Spoke with patient and gave her Dr Theodosia Blender recommendations. She has been taking her Lasix daily. Advised to hold and recheck on Friday however she would like to wait until Monday for a recheck. Explained this would have to be Dr Jacalyn Lefevre decision. She wanted for Dr Stanford Breed to review and call her with results/recommendations. ------  Notes Recorded by Lelon Perla, MD on 12/17/2013 at 6:27 PM Dc lasix bmet 6/26 Should be followed by nephrology Kirk Ruths

## 2013-12-18 NOTE — Telephone Encounter (Signed)
Patient is coming in Friday for labs. She also wants to see Dr Stanford Breed, but would like a call back to discuss this. Please call and advise.

## 2013-12-19 DIAGNOSIS — N189 Chronic kidney disease, unspecified: Secondary | ICD-10-CM | POA: Diagnosis not present

## 2013-12-19 NOTE — Telephone Encounter (Signed)
Dr Stanford Breed has spoken with the patient.

## 2013-12-23 ENCOUNTER — Encounter: Payer: Self-pay | Admitting: *Deleted

## 2013-12-23 NOTE — Progress Notes (Signed)
This encounter was created in error - please disregard.

## 2013-12-24 DIAGNOSIS — I10 Essential (primary) hypertension: Secondary | ICD-10-CM | POA: Diagnosis not present

## 2013-12-24 DIAGNOSIS — D631 Anemia in chronic kidney disease: Secondary | ICD-10-CM | POA: Diagnosis not present

## 2013-12-24 DIAGNOSIS — N189 Chronic kidney disease, unspecified: Secondary | ICD-10-CM | POA: Diagnosis not present

## 2013-12-24 DIAGNOSIS — N039 Chronic nephritic syndrome with unspecified morphologic changes: Secondary | ICD-10-CM | POA: Diagnosis not present

## 2014-01-09 ENCOUNTER — Other Ambulatory Visit (HOSPITAL_BASED_OUTPATIENT_CLINIC_OR_DEPARTMENT_OTHER): Payer: Medicare Other

## 2014-01-09 DIAGNOSIS — R894 Abnormal immunological findings in specimens from other organs, systems and tissues: Secondary | ICD-10-CM | POA: Diagnosis not present

## 2014-01-09 DIAGNOSIS — D696 Thrombocytopenia, unspecified: Secondary | ICD-10-CM

## 2014-01-09 DIAGNOSIS — Z7901 Long term (current) use of anticoagulants: Secondary | ICD-10-CM | POA: Diagnosis not present

## 2014-01-09 DIAGNOSIS — D689 Coagulation defect, unspecified: Secondary | ICD-10-CM | POA: Diagnosis not present

## 2014-01-09 DIAGNOSIS — D6859 Other primary thrombophilia: Secondary | ICD-10-CM | POA: Diagnosis not present

## 2014-01-09 DIAGNOSIS — D6861 Antiphospholipid syndrome: Secondary | ICD-10-CM

## 2014-01-09 DIAGNOSIS — I10 Essential (primary) hypertension: Secondary | ICD-10-CM | POA: Diagnosis not present

## 2014-01-09 LAB — COMPREHENSIVE METABOLIC PANEL (CC13)
ALT: 19 U/L (ref 0–55)
AST: 27 U/L (ref 5–34)
Albumin: 4 g/dL (ref 3.5–5.0)
Alkaline Phosphatase: 72 U/L (ref 40–150)
Anion Gap: 9 mEq/L (ref 3–11)
BILIRUBIN TOTAL: 0.54 mg/dL (ref 0.20–1.20)
BUN: 50.4 mg/dL — ABNORMAL HIGH (ref 7.0–26.0)
CO2: 28 mEq/L (ref 22–29)
CREATININE: 2 mg/dL — AB (ref 0.6–1.1)
Calcium: 9.1 mg/dL (ref 8.4–10.4)
Chloride: 103 mEq/L (ref 98–109)
Glucose: 72 mg/dl (ref 70–140)
Potassium: 4.3 mEq/L (ref 3.5–5.1)
Sodium: 140 mEq/L (ref 136–145)
Total Protein: 7.3 g/dL (ref 6.4–8.3)

## 2014-01-09 LAB — CBC WITH DIFFERENTIAL/PLATELET
BASO%: 1.3 % (ref 0.0–2.0)
Basophils Absolute: 0.1 10*3/uL (ref 0.0–0.1)
EOS%: 3.1 % (ref 0.0–7.0)
Eosinophils Absolute: 0.2 10*3/uL (ref 0.0–0.5)
HEMATOCRIT: 33.8 % — AB (ref 34.8–46.6)
HGB: 10.9 g/dL — ABNORMAL LOW (ref 11.6–15.9)
LYMPH%: 18.1 % (ref 14.0–49.7)
MCH: 30.4 pg (ref 25.1–34.0)
MCHC: 32.3 g/dL (ref 31.5–36.0)
MCV: 94.1 fL (ref 79.5–101.0)
MONO#: 0.4 10*3/uL (ref 0.1–0.9)
MONO%: 6 % (ref 0.0–14.0)
NEUT#: 4.4 10*3/uL (ref 1.5–6.5)
NEUT%: 71.5 % (ref 38.4–76.8)
Platelets: 116 10*3/uL — ABNORMAL LOW (ref 145–400)
RBC: 3.59 10*6/uL — AB (ref 3.70–5.45)
RDW: 13.3 % (ref 11.2–14.5)
WBC: 6.1 10*3/uL (ref 3.9–10.3)
lymph#: 1.1 10*3/uL (ref 0.9–3.3)

## 2014-01-09 LAB — HEPARIN ANTI-XA: Heparin LMW: 1.03 IU/mL

## 2014-01-12 ENCOUNTER — Other Ambulatory Visit: Payer: Medicare Other

## 2014-01-13 ENCOUNTER — Telehealth: Payer: Self-pay | Admitting: Cardiology

## 2014-01-13 NOTE — Telephone Encounter (Signed)
Spoke with pt, Aware of dr crenshaw's recommendations.  °

## 2014-01-13 NOTE — Telephone Encounter (Signed)
Spoke with pt, she has a follow up appt 02-06-14 but she wanted dr Stanford Breed to know what is going on in case she needs to be seen sooner. Dr Lorrene Reid has stopped her lisinopril due to bp in the 90's and orthostatic symptoms. She also told the pt to only take lasix if swelling in the right leg and increase in wgt of 4 lbs. Today she took 20 mg of lasix due to weight gain. Her bp cont to run 90's/60's consistently. She has no SOB or orthopnea. She also reports her heart rate was elevated when at dr dunham's. Her next remote device check is in august. Her repeat blood work has been better by her report. Will forward for dr Stanford Breed review

## 2014-01-13 NOTE — Telephone Encounter (Signed)
New message     Talk to Dr Jacalyn Lefevre nurse.  She would not tell me what she wanted

## 2014-01-13 NOTE — Telephone Encounter (Signed)
If she feels ok, keep fuov as scheduled Kirk Ruths

## 2014-01-28 ENCOUNTER — Ambulatory Visit (INDEPENDENT_AMBULATORY_CARE_PROVIDER_SITE_OTHER): Payer: Medicare Other | Admitting: *Deleted

## 2014-01-28 DIAGNOSIS — N189 Chronic kidney disease, unspecified: Secondary | ICD-10-CM | POA: Diagnosis not present

## 2014-01-28 DIAGNOSIS — Z950 Presence of cardiac pacemaker: Secondary | ICD-10-CM

## 2014-01-28 LAB — MDC_IDC_ENUM_SESS_TYPE_INCLINIC
Battery Voltage: 2.93 V
Brady Statistic RA Percent Paced: 0 %
Implantable Pulse Generator Model: 3210
Implantable Pulse Generator Serial Number: 2786353
Lead Channel Pacing Threshold Amplitude: 0.5 V
Lead Channel Pacing Threshold Amplitude: 0.75 V
Lead Channel Pacing Threshold Pulse Width: 0.5 ms
Lead Channel Pacing Threshold Pulse Width: 0.5 ms
Lead Channel Sensing Intrinsic Amplitude: 7.2 mV
Lead Channel Setting Pacing Amplitude: 2 V
Lead Channel Setting Pacing Pulse Width: 0.5 ms
MDC IDC MSMT BATTERY REMAINING LONGEVITY: 69.6 mo
MDC IDC MSMT LEADCHNL LV IMPEDANCE VALUE: 1025 Ohm
MDC IDC MSMT LEADCHNL LV PACING THRESHOLD AMPLITUDE: 0.625 V
MDC IDC MSMT LEADCHNL LV PACING THRESHOLD PULSEWIDTH: 0.8 ms
MDC IDC MSMT LEADCHNL RV IMPEDANCE VALUE: 400 Ohm
MDC IDC SESS DTM: 20150805092432
MDC IDC SET LEADCHNL LV PACING AMPLITUDE: 2 V
MDC IDC SET LEADCHNL LV PACING PULSEWIDTH: 0.8 ms
MDC IDC SET LEADCHNL RV SENSING SENSITIVITY: 2 mV
MDC IDC STAT BRADY RV PERCENT PACED: 96 %

## 2014-01-28 NOTE — Progress Notes (Signed)
Pt c/o of increased fatigue. Unable to send remote---pt will call tech svcs today to trouble shoot. CRT-P device check in clinic. Normal device function. Thresholds, sensing, impedance consistent with previous measurements. Histograms appropriate for patient and level of activity. No ventricular high rate episodes. Patient bi-ventricularly pacing 96% of the time. Device programmed with appropriate safety margins. Estimated longevity 5.8-6.75yrs. ROV w/ Dr. Rayann Heman 05/11/14.

## 2014-01-29 DIAGNOSIS — L820 Inflamed seborrheic keratosis: Secondary | ICD-10-CM | POA: Diagnosis not present

## 2014-01-29 DIAGNOSIS — L819 Disorder of pigmentation, unspecified: Secondary | ICD-10-CM | POA: Diagnosis not present

## 2014-01-29 DIAGNOSIS — D692 Other nonthrombocytopenic purpura: Secondary | ICD-10-CM | POA: Diagnosis not present

## 2014-01-29 DIAGNOSIS — D239 Other benign neoplasm of skin, unspecified: Secondary | ICD-10-CM | POA: Diagnosis not present

## 2014-01-29 DIAGNOSIS — D1801 Hemangioma of skin and subcutaneous tissue: Secondary | ICD-10-CM | POA: Diagnosis not present

## 2014-01-29 DIAGNOSIS — Z85828 Personal history of other malignant neoplasm of skin: Secondary | ICD-10-CM | POA: Diagnosis not present

## 2014-01-29 DIAGNOSIS — L570 Actinic keratosis: Secondary | ICD-10-CM | POA: Diagnosis not present

## 2014-01-29 DIAGNOSIS — L821 Other seborrheic keratosis: Secondary | ICD-10-CM | POA: Diagnosis not present

## 2014-02-06 ENCOUNTER — Ambulatory Visit (INDEPENDENT_AMBULATORY_CARE_PROVIDER_SITE_OTHER): Payer: Medicare Other | Admitting: Cardiology

## 2014-02-06 ENCOUNTER — Encounter: Payer: Self-pay | Admitting: Cardiology

## 2014-02-06 VITALS — BP 130/70 | HR 82 | Ht 63.0 in | Wt 158.7 lb

## 2014-02-06 DIAGNOSIS — I4891 Unspecified atrial fibrillation: Secondary | ICD-10-CM

## 2014-02-06 DIAGNOSIS — I428 Other cardiomyopathies: Secondary | ICD-10-CM | POA: Insufficient documentation

## 2014-02-06 DIAGNOSIS — N189 Chronic kidney disease, unspecified: Secondary | ICD-10-CM

## 2014-02-06 DIAGNOSIS — I429 Cardiomyopathy, unspecified: Secondary | ICD-10-CM

## 2014-02-06 DIAGNOSIS — I5042 Chronic combined systolic (congestive) and diastolic (congestive) heart failure: Secondary | ICD-10-CM

## 2014-02-06 MED ORDER — DILTIAZEM HCL ER COATED BEADS 180 MG PO CP24: 180.0000 mg | ORAL_CAPSULE | Freq: Every day | ORAL | Status: DC

## 2014-02-06 MED ORDER — METOPROLOL SUCCINATE ER 50 MG PO TB24: 50.0000 mg | ORAL_TABLET | Freq: Every day | ORAL | Status: DC

## 2014-02-06 NOTE — Assessment & Plan Note (Signed)
Followed by nephrology. 

## 2014-02-06 NOTE — Assessment & Plan Note (Signed)
Patient with ejection fraction 35-40%. I will try and wean Cardizem in favor of beta-blockade. She is concerned about the possibility of increased depression with beta-blockade. Change Cardizem to 180 mg daily. Add Toprol 50 mg daily. She will see me back in 4 weeks and I will try to wean Cardizem completely and increase Toprol if she tolerates. We would then add ACE inhibitor later as tolerated by blood pressure and renal function.

## 2014-02-06 NOTE — Assessment & Plan Note (Signed)
Continued anticoagulation. Continue Cardizem.

## 2014-02-06 NOTE — Progress Notes (Signed)
HPI: FU rheumatic heart disease, atrial tachycardia/flutter, diastolic congestive heart failure. The patient had mitral valvuloplasty at [facility] in the late 90s. In 2007 she had a mechanical mitral valve replacement at [facility]. 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 She's had a prior subdural hematoma that required evacuation. She also has a history of heparin-induced thrombocytopenia. She is followed by Dr Rayann Heman for SVT and is s/p ablation at [facility]; also with h/o torsades with amiodarone. Has had previous biventricular pacemaker. Last echocardiogram in June 2015 showed worse EF 35-40, trace AI, MVR and biatrial enlargement. Diuretics increased at last OV and pt developed worsening renal insuff. Seen by nephrology adn diuretics reduced. Since she was last seen She notes some increased fatigue. She denies dyspnea, chest pain, palpitations or syncope. She occasionally has minimal pedal edema.   Current Outpatient Prescriptions  Medication Sig Dispense Refill  . acetaminophen (TYLENOL) 650 MG CR tablet Take 650 mg by mouth as needed.       . AMOXICILLIN PO Take 4 capsules by mouth as directed. ONLY WHEN VISITING DENTIST      . Calcium-Magnesium (CAL-MAG PO) Take 1 tablet by mouth daily.       . Cholecalciferol (VITAMIN D) 2000 UNITS tablet Take 2,000 Units by mouth daily.        Marland Kitchen diltiazem (CARDIZEM CD) 180 MG 24 hr capsule Take 2 by mouth daily  180 capsule  3  . Estradiol Acetate (FEMRING) 0.05 MG/24HR RING Place 1 each vaginally as directed.       . fish oil-omega-3 fatty acids 1000 MG capsule Take 1 g by mouth daily.       . fondaparinux (ARIXTRA) 5 MG/0.4ML SOLN injection Inject 0.4 mLs (5 mg total) into the skin daily. Use as directed  1.6 mL  11  . furosemide (LASIX) 40 MG tablet One tablet once daily as needed for swelling or shortness of breath  90 tablet  3  .  levothyroxine (SYNTHROID, LEVOTHROID) 75 MCG tablet Take 75 mcg by mouth daily.        . rosuvastatin (CRESTOR) 5 MG tablet Take 5 mg by mouth daily.      . valACYclovir (VALTREX) 1000 MG tablet Take 1,000 mg by mouth 2 (two) times daily. For a total of 3 doses for labial herpes prn only       Current Facility-Administered Medications  Medication Dose Route Frequency Provider Last Rate Last Dose  . botulinum toxin Type A (BOTOX) injection 100 Units  100 Units Intramuscular Once Hulen Luster, DO      . botulinum toxin Type A (BOTOX) injection 25 Units  25 Units Intramuscular Once Hulen Luster, DO      . botulinum toxin Type A (BOTOX) injection 40 Units  40 Units Intramuscular Once Hulen Luster, DO         Past Medical History  Diagnosis Date  . Rheumatic heart disease 1995    post percutaneous valvuloplasty at [facility] with subsequent mechanical mitral valve replacement and tricuspid annuloplasty at [facility]  . Subdural hematoma 2009    on coumadin  . Heparin-induced thrombocytopenia   . HTN (hypertension)   . History of Hashimoto thyroiditis   . HLD (hyperlipidemia)   . Atrial flutter     atypical right atrial flutter s/p ablation at [facility] by Dr Clyda Hurdle  .  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 Pacemaker   . Antiphospholipid antibody syndrome   . Anemia   . Heart murmur   . CHF (congestive heart failure)   . Hypothyroidism   . History of blood transfusion     "lots; most in 2007 w/MVR OR"  . Cerebrovascular accident ?1990; 1993; 2007    residual "can't use my right hand; left visual field cut" (03/28/2012)  . Arthritis     "may have some" (03/28/2012)  . Chronic kidney disease     "as a result of my heart failing" (03/28/2012)  . Fine motor impairment     "fingers right hand" (03/28/2012)  . Right leg swelling 10/06/2013    Asymmetric; no calf pain, venous doppler negative for DVT 10/06/13    Past Surgical History  Procedure  Laterality Date  . Mitral valve replacement  2007    with tricuspid annuloplasty  . Amputation      left atrial appendage amputated with MVR  . Craniotomy  2008    for SDH  . Cholecystectomy    . Tonsillectomy    . Percutaneous balloon valvuloplasty  1995    At [facility] with subsequent mechanical mitral valve replacement and tricuspid annuloplasty at [facility]  . Atrial ablation surgery      CTI and R atriostomy scar flutter ablation at [facility] 8/12,  repeat  ablation at [facility]  9/12  . Cardiac valve replacement    . Tonsillectomy      "when I was a child"  . Total abdominal hysterectomy  ?1995    TAH; BSO  . Biventricular pacemaker placement  03/28/2012    SJM Anthem implanted by Dr Lovena Le  . Cardioversion  2010-2011    "3 at [facility]" (03/28/2012)  . Cardiac electrophysiology mapping and ablation  2012    "2 @ [facility]" (03/28/2012    History   Social History  . Marital Status: Married    Spouse Name: Alease Medina     Number of Children: 0  . Years of Education: college   Occupational History  . PHYSICIAN     retired   Social History Main Topics  . Smoking status: Former Smoker -- 0.10 packs/day for 6 years    Types: Cigarettes  . Smokeless tobacco: Never Used     Comment: 03/28/2012 "smoked in the 1970's"  . Alcohol Use: 0.0 oz/week     Comment: wine rarely.  . Drug Use: No  . Sexual Activity: Not on file   Other Topics Concern  . 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  Quit smoking 25 years ago and quit alcohol 2 years ago.   Denies alcohol, caffeine,  and illicit drug use.      ROS: Fatigue but no fevers or chills, productive cough, hemoptysis, dysphasia, odynophagia, melena, hematochezia, dysuria, hematuria, rash, seizure activity, orthopnea, PND, claudication. Remaining systems are negative.  Physical Exam: Well-developed well-nourished in no acute distress.  Skin is warm and dry.    HEENT is normal.  Neck is supple.  Chest is clear to auscultation with normal expansion.  Cardiovascular exam is regular rate and rhythm. Crisp mechanical valve sounds Abdominal exam nontender or distended. No masses palpated. Extremities show no edema. neuro grossly intact  ECG Ventricular pacing with probable underlying atrial fibrillation.

## 2014-02-06 NOTE — Assessment & Plan Note (Signed)
Her volume status is very tenuous. If she takes mild increased dose of diuretics her renal function deteriorates rapidly. If she receives additional fluid she develops heart failure. We will continue with present dose of Lasix and she is euvolemic on examination.

## 2014-02-06 NOTE — Assessment & Plan Note (Signed)
Blood pressure controlled. 

## 2014-02-06 NOTE — Patient Instructions (Signed)
Your physician recommends that you schedule a follow-up appointment in: 4 WEEKS WITH DR CRENSHAW  DECREASE DILTIAZEM TO 180 MG ONCE DAILY  START METOPROLOL SUCC ER 50 MG ONCE DAILY

## 2014-02-06 NOTE — Assessment & Plan Note (Signed)
Management per electrophysiology. 

## 2014-02-06 NOTE — Assessment & Plan Note (Signed)
Continued anticoagulation. Continue SBE prophylaxis.

## 2014-02-09 ENCOUNTER — Encounter: Payer: Self-pay | Admitting: Internal Medicine

## 2014-02-16 DIAGNOSIS — M7989 Other specified soft tissue disorders: Secondary | ICD-10-CM | POA: Diagnosis not present

## 2014-02-19 DIAGNOSIS — N390 Urinary tract infection, site not specified: Secondary | ICD-10-CM | POA: Diagnosis not present

## 2014-02-19 DIAGNOSIS — R29898 Other symptoms and signs involving the musculoskeletal system: Secondary | ICD-10-CM | POA: Diagnosis not present

## 2014-02-19 DIAGNOSIS — N189 Chronic kidney disease, unspecified: Secondary | ICD-10-CM | POA: Diagnosis not present

## 2014-02-19 DIAGNOSIS — R3915 Urgency of urination: Secondary | ICD-10-CM | POA: Diagnosis not present

## 2014-02-19 DIAGNOSIS — I10 Essential (primary) hypertension: Secondary | ICD-10-CM | POA: Diagnosis not present

## 2014-02-19 DIAGNOSIS — E039 Hypothyroidism, unspecified: Secondary | ICD-10-CM | POA: Diagnosis not present

## 2014-02-19 DIAGNOSIS — D631 Anemia in chronic kidney disease: Secondary | ICD-10-CM | POA: Diagnosis not present

## 2014-02-19 DIAGNOSIS — N039 Chronic nephritic syndrome with unspecified morphologic changes: Secondary | ICD-10-CM | POA: Diagnosis not present

## 2014-02-24 ENCOUNTER — Telehealth: Payer: Self-pay | Admitting: *Deleted

## 2014-02-24 NOTE — Telephone Encounter (Signed)
Call from pt - states lack of energy; her " get up and go has gone". States Dr Lorrene Reid drew labs - hgb is 9.0. She's aware u are on vacation. Wants to keep u inform.

## 2014-03-05 ENCOUNTER — Telehealth: Payer: Self-pay | Admitting: Cardiology

## 2014-03-05 NOTE — Telephone Encounter (Signed)
Pt called and stated that she does not feel well. I requested that pt send manual transmission. Pt verbalized understanding.

## 2014-03-05 NOTE — Telephone Encounter (Signed)
Received transmission. Showed transmission with a device tech and verified that transmission was normal. Informed pt of this and she verbalized understanding.

## 2014-03-16 ENCOUNTER — Other Ambulatory Visit (HOSPITAL_BASED_OUTPATIENT_CLINIC_OR_DEPARTMENT_OTHER): Payer: Medicare Other

## 2014-03-16 ENCOUNTER — Other Ambulatory Visit: Payer: Self-pay | Admitting: Oncology

## 2014-03-16 ENCOUNTER — Encounter: Payer: Self-pay | Admitting: Cardiology

## 2014-03-16 ENCOUNTER — Ambulatory Visit (INDEPENDENT_AMBULATORY_CARE_PROVIDER_SITE_OTHER): Payer: Medicare Other | Admitting: Cardiology

## 2014-03-16 VITALS — BP 100/60 | HR 80 | Ht 63.0 in | Wt 162.0 lb

## 2014-03-16 DIAGNOSIS — D696 Thrombocytopenia, unspecified: Secondary | ICD-10-CM

## 2014-03-16 DIAGNOSIS — D6861 Antiphospholipid syndrome: Secondary | ICD-10-CM

## 2014-03-16 DIAGNOSIS — N189 Chronic kidney disease, unspecified: Secondary | ICD-10-CM

## 2014-03-16 DIAGNOSIS — Z7901 Long term (current) use of anticoagulants: Secondary | ICD-10-CM

## 2014-03-16 DIAGNOSIS — D509 Iron deficiency anemia, unspecified: Secondary | ICD-10-CM

## 2014-03-16 DIAGNOSIS — D6859 Other primary thrombophilia: Secondary | ICD-10-CM

## 2014-03-16 DIAGNOSIS — I5042 Chronic combined systolic (congestive) and diastolic (congestive) heart failure: Secondary | ICD-10-CM | POA: Diagnosis not present

## 2014-03-16 DIAGNOSIS — I4891 Unspecified atrial fibrillation: Secondary | ICD-10-CM | POA: Diagnosis not present

## 2014-03-16 DIAGNOSIS — Z950 Presence of cardiac pacemaker: Secondary | ICD-10-CM

## 2014-03-16 DIAGNOSIS — I428 Other cardiomyopathies: Secondary | ICD-10-CM

## 2014-03-16 DIAGNOSIS — Z9889 Other specified postprocedural states: Secondary | ICD-10-CM | POA: Diagnosis not present

## 2014-03-16 DIAGNOSIS — D689 Coagulation defect, unspecified: Secondary | ICD-10-CM

## 2014-03-16 DIAGNOSIS — I482 Chronic atrial fibrillation, unspecified: Secondary | ICD-10-CM

## 2014-03-16 DIAGNOSIS — Z5181 Encounter for therapeutic drug level monitoring: Secondary | ICD-10-CM | POA: Diagnosis not present

## 2014-03-16 DIAGNOSIS — I429 Cardiomyopathy, unspecified: Secondary | ICD-10-CM

## 2014-03-16 LAB — CBC & DIFF AND RETIC
BASO%: 1 % (ref 0.0–2.0)
BASOS ABS: 0.1 10*3/uL (ref 0.0–0.1)
EOS%: 4.5 % (ref 0.0–7.0)
Eosinophils Absolute: 0.4 10*3/uL (ref 0.0–0.5)
HEMATOCRIT: 30.8 % — AB (ref 34.8–46.6)
HGB: 9.8 g/dL — ABNORMAL LOW (ref 11.6–15.9)
Immature Retic Fract: 12.8 % — ABNORMAL HIGH (ref 1.60–10.00)
LYMPH#: 0.9 10*3/uL (ref 0.9–3.3)
LYMPH%: 11.5 % — AB (ref 14.0–49.7)
MCH: 30.2 pg (ref 25.1–34.0)
MCHC: 31.8 g/dL (ref 31.5–36.0)
MCV: 95.1 fL (ref 79.5–101.0)
MONO#: 0.3 10*3/uL (ref 0.1–0.9)
MONO%: 3.3 % (ref 0.0–14.0)
NEUT#: 6.3 10*3/uL (ref 1.5–6.5)
NEUT%: 79.7 % — AB (ref 38.4–76.8)
Platelets: 66 10*3/uL — ABNORMAL LOW (ref 145–400)
RBC: 3.24 10*6/uL — ABNORMAL LOW (ref 3.70–5.45)
RDW: 14.4 % (ref 11.2–14.5)
RETIC CT ABS: 53.46 10*3/uL (ref 33.70–90.70)
Retic %: 1.65 % (ref 0.70–2.10)
WBC: 7.9 10*3/uL (ref 3.9–10.3)
nRBC: 0 % (ref 0–0)

## 2014-03-16 LAB — COMPREHENSIVE METABOLIC PANEL (CC13)
ALBUMIN: 3.7 g/dL (ref 3.5–5.0)
ALT: 34 U/L (ref 0–55)
ANION GAP: 14 meq/L — AB (ref 3–11)
AST: 35 U/L — ABNORMAL HIGH (ref 5–34)
Alkaline Phosphatase: 111 U/L (ref 40–150)
BUN: 46 mg/dL — ABNORMAL HIGH (ref 7.0–26.0)
CHLORIDE: 105 meq/L (ref 98–109)
CO2: 24 mEq/L (ref 22–29)
Calcium: 9.5 mg/dL (ref 8.4–10.4)
Creatinine: 2.2 mg/dL — ABNORMAL HIGH (ref 0.6–1.1)
GLUCOSE: 150 mg/dL — AB (ref 70–140)
POTASSIUM: 3.7 meq/L (ref 3.5–5.1)
SODIUM: 143 meq/L (ref 136–145)
TOTAL PROTEIN: 7.3 g/dL (ref 6.4–8.3)
Total Bilirubin: 1.01 mg/dL (ref 0.20–1.20)

## 2014-03-16 LAB — MORPHOLOGY: PLT EST: DECREASED

## 2014-03-16 LAB — HEPARIN ANTI-XA: Heparin LMW: 1.22 IU/mL

## 2014-03-16 LAB — CHCC SMEAR

## 2014-03-16 LAB — LACTATE DEHYDROGENASE (CC13): LDH: 359 U/L — ABNORMAL HIGH (ref 125–245)

## 2014-03-16 MED ORDER — FUROSEMIDE 20 MG PO TABS: 20.0000 mg | ORAL_TABLET | Freq: Every day | ORAL | Status: DC

## 2014-03-16 NOTE — Assessment & Plan Note (Signed)
Patient is having worsening heart failure. Her volume status is very tenuous. She becomes acutely short of breath with small increases in volume. Lasix 40 mg daily for 4 days previously caused severe worsening of renal function. I will add 20 mg of Lasix daily as she is volume overloaded today. We will check her potassium and renal function 4 days. I will ask the CHF clinic to see her and optimize regimen. She will need close followup with nephrology as well. Patient is very concerned about her congestive heart failure and volume status. She is markedly symptomatic.

## 2014-03-16 NOTE — Patient Instructions (Signed)
Your physician recommends that you schedule a follow-up appointment in: [place]  Your physician recommends that you return for lab work Thursday [place] [facility]= Tuesday 03-24-14 @ 10:20 AM

## 2014-03-16 NOTE — Assessment & Plan Note (Signed)
Continue beta blocker. ACE inhibitor was continued by nephrology previously because blood pressure was low. We'll reconsider reinitiating in the future if blood pressure and renal function allows.

## 2014-03-16 NOTE — Assessment & Plan Note (Signed)
Continue SBE prophylaxis. Most recent mean gradient is normal. Crisp mechanical valve sound on examination. Continue anticoagulation.

## 2014-03-16 NOTE — Assessment & Plan Note (Signed)
Followed by nephrology. 

## 2014-03-16 NOTE — Assessment & Plan Note (Signed)
Followed by electrophysiology. 

## 2014-03-16 NOTE — Progress Notes (Signed)
HPI: FU rheumatic heart disease, atrial tachycardia/flutter, diastolic congestive heart failure. The patient had mitral valvuloplasty at [facility] in the late 90s. In 2007 she had a mechanical mitral valve replacement at [facility]. 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 She's had a prior subdural hematoma that required evacuation. She also has a history of heparin-induced thrombocytopenia. She is followed by Dr Rayann Heman for SVT and is s/p ablation at [facility]; also with h/o torsades with amiodarone. Has had previous biventricular pacemaker. Last echocardiogram in June 2015 showed worse EF 35-40, trace AI, MVR and biatrial enlargement. Previously with worsening renal insuff with small increases in diuretics. At last OV we decreased cardizem and increased diuretics. Since then, She has had increased dyspnea on exertion. She had orthopnea last evening and states she almost called EMS. She took 40 mg of Lasix with improvement in symptoms. She has had bilateral lower extremity edema. Her weight is 161. No chest pain, palpitations or syncope.   Current Outpatient Prescriptions  Medication Sig Dispense Refill  . levothyroxine (SYNTHROID, LEVOTHROID) 88 MCG tablet Take 88 mcg by mouth daily before breakfast.      . acetaminophen (TYLENOL) 650 MG CR tablet Take 650 mg by mouth as needed.       . AMOXICILLIN PO Take 4 capsules by mouth as directed. ONLY WHEN VISITING DENTIST      . Calcium-Magnesium (CAL-MAG PO) Take 1 tablet by mouth daily.       . Cholecalciferol (VITAMIN D) 2000 UNITS tablet Take 2,000 Units by mouth daily.        Marland Kitchen diltiazem (CARDIZEM CD) 180 MG 24 hr capsule Take 1 capsule (180 mg total) by mouth daily.  180 capsule  3  . Estradiol Acetate (FEMRING) 0.05 MG/24HR RING Place 1 each vaginally as directed.       . fish oil-omega-3 fatty acids 1000 MG capsule Take 1 g by mouth  daily.       . fondaparinux (ARIXTRA) 5 MG/0.4ML SOLN injection Inject 0.4 mLs (5 mg total) into the skin daily. Use as directed  1.6 mL  11  . furosemide (LASIX) 20 MG tablet Take 1 tablet (20 mg total) by mouth daily.  90 tablet  3  . furosemide (LASIX) 40 MG tablet One tablet once daily as needed for swelling or shortness of breath  90 tablet  3  . metoprolol succinate (TOPROL-XL) 50 MG 24 hr tablet Take 1 tablet (50 mg total) by mouth daily. Take with or immediately following a meal.  90 tablet  3  . valACYclovir (VALTREX) 1000 MG tablet Take 1,000 mg by mouth 2 (two) times daily. For a total of 3 doses for labial herpes prn only       Current Facility-Administered Medications  Medication Dose Route Frequency Provider Last Rate Last Dose  . botulinum toxin Type A (BOTOX) injection 100 Units  100 Units Intramuscular Once Hulen Luster, DO      . botulinum toxin Type A (BOTOX) injection 25 Units  25 Units Intramuscular Once Hulen Luster, DO      . botulinum toxin Type A (BOTOX) injection 40 Units  40 Units Intramuscular Once Hulen Luster, DO         Past Medical History  Diagnosis Date  . Rheumatic heart disease 1995    post percutaneous valvuloplasty at [facility] with subsequent  mechanical mitral valve replacement and tricuspid annuloplasty at [facility]  . Subdural hematoma 2009    on coumadin  . Heparin-induced thrombocytopenia   . HTN (hypertension)   . History of Hashimoto thyroiditis   . HLD (hyperlipidemia)   . Atrial flutter     atypical right atrial flutter s/p ablation at [facility] by Dr Clyda Hurdle  . 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 Pacemaker   . Antiphospholipid antibody syndrome   . Anemia   . Heart murmur   . CHF (congestive heart failure)   . Hypothyroidism   . History of blood transfusion     "lots; most in 2007 w/MVR OR"  . Cerebrovascular accident ?1990; 1993; 2007    residual "can't use my right hand; left visual  field cut" (03/28/2012)  . Arthritis     "may have some" (03/28/2012)  . Chronic kidney disease     "as a result of my heart failing" (03/28/2012)  . Fine motor impairment     "fingers right hand" (03/28/2012)  . Right leg swelling 10/06/2013    Asymmetric; no calf pain, venous doppler negative for DVT 10/06/13    Past Surgical History  Procedure Laterality Date  . Mitral valve replacement  2007    with tricuspid annuloplasty  . Amputation      left atrial appendage amputated with MVR  . Craniotomy  2008    for SDH  . Cholecystectomy    . Tonsillectomy    . Percutaneous balloon valvuloplasty  1995    At [facility] with subsequent mechanical mitral valve replacement and tricuspid annuloplasty at [facility]  . Atrial ablation surgery      CTI and R atriostomy scar flutter ablation at [facility] 8/12,  repeat  ablation at [facility]  9/12  . Cardiac valve replacement    . Tonsillectomy      "when I was a child"  . Total abdominal hysterectomy  ?1995    TAH; BSO  . Biventricular pacemaker placement  03/28/2012    SJM Anthem implanted by Dr Lovena Le  . Cardioversion  2010-2011    "3 at [facility]" (03/28/2012)  . Cardiac electrophysiology mapping and ablation  2012    "2 @ [facility]" (03/28/2012    History   Social History  . Marital Status: Married    Spouse Name: Alease Medina     Number of Children: 0  . Years of Education: college   Occupational History  . PHYSICIAN     retired   Social History Main Topics  . Smoking status: Former Smoker -- 0.10 packs/day for 6 years    Types: Cigarettes  . Smokeless tobacco: Never Used     Comment: 03/28/2012 "smoked in the 1970's"  . Alcohol Use: 0.0 oz/week     Comment: wine rarely.  . Drug Use: No  . Sexual Activity: Not on file   Other Topics Concern  . 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  Quit smoking 25 years ago and quit alcohol 2 years ago.   Denies alcohol,  caffeine,  and illicit drug use.      ROS: no fevers or chills, productive cough, hemoptysis, dysphasia, odynophagia, melena, hematochezia, dysuria, hematuria, rash, seizure activity, claudication. Remaining systems are negative.  Physical Exam: Well-developed well-nourished in no acute distress.  Skin is warm and dry.  HEENT is normal.  Neck is  supple.  Chest is clear to auscultation with normal expansion.  Cardiovascular exam is regular rate and rhythm. Crisp mechanical valve sound Abdominal exam nontender or distended. No masses palpated. Extremities show no edema. neuro grossly intact

## 2014-03-16 NOTE — Assessment & Plan Note (Signed)
Continue Cardizem and beta blocker. Continue anticoagulation. Ultimately I would like to wean Cardizem completely off and increase beta blocker for rate control given cardiomyopathy.

## 2014-03-16 NOTE — Assessment & Plan Note (Signed)
Followed by Dr Beryle Beams; continue arixtra.

## 2014-03-17 LAB — IRON AND TIBC CHCC
%SAT: 12 % — ABNORMAL LOW (ref 21–57)
Iron: 42 ug/dL (ref 41–142)
TIBC: 351 ug/dL (ref 236–444)
UIBC: 310 ug/dL (ref 120–384)

## 2014-03-17 LAB — FERRITIN CHCC: FERRITIN: 178 ng/mL (ref 9–269)

## 2014-03-18 DIAGNOSIS — Z23 Encounter for immunization: Secondary | ICD-10-CM | POA: Diagnosis not present

## 2014-03-19 DIAGNOSIS — I5042 Chronic combined systolic (congestive) and diastolic (congestive) heart failure: Secondary | ICD-10-CM | POA: Diagnosis not present

## 2014-03-19 LAB — BASIC METABOLIC PANEL WITH GFR
BUN: 38 mg/dL — AB (ref 6–23)
CHLORIDE: 101 meq/L (ref 96–112)
CO2: 25 mEq/L (ref 19–32)
Calcium: 8.4 mg/dL (ref 8.4–10.5)
Creat: 1.98 mg/dL — ABNORMAL HIGH (ref 0.50–1.10)
GFR, EST AFRICAN AMERICAN: 29 mL/min — AB
GFR, EST NON AFRICAN AMERICAN: 26 mL/min — AB
GLUCOSE: 107 mg/dL — AB (ref 70–99)
Potassium: 4.5 mEq/L (ref 3.5–5.3)
SODIUM: 139 meq/L (ref 135–145)

## 2014-03-24 ENCOUNTER — Ambulatory Visit (HOSPITAL_COMMUNITY)
Admission: RE | Admit: 2014-03-24 | Discharge: 2014-03-24 | Disposition: A | Discharge status: Home / Self Care | Payer: Medicare Other | Source: Ambulatory Visit | Attending: Cardiology | Admitting: Cardiology

## 2014-03-24 VITALS — BP 114/68 | HR 112 | Wt 157.1 lb

## 2014-03-24 DIAGNOSIS — Z79899 Other long term (current) drug therapy: Secondary | ICD-10-CM | POA: Insufficient documentation

## 2014-03-24 DIAGNOSIS — N189 Chronic kidney disease, unspecified: Secondary | ICD-10-CM | POA: Diagnosis not present

## 2014-03-24 DIAGNOSIS — Z9889 Other specified postprocedural states: Secondary | ICD-10-CM | POA: Insufficient documentation

## 2014-03-24 DIAGNOSIS — I482 Chronic atrial fibrillation, unspecified: Secondary | ICD-10-CM

## 2014-03-24 DIAGNOSIS — Z7901 Long term (current) use of anticoagulants: Secondary | ICD-10-CM | POA: Diagnosis not present

## 2014-03-24 DIAGNOSIS — I129 Hypertensive chronic kidney disease with stage 1 through stage 4 chronic kidney disease, or unspecified chronic kidney disease: Secondary | ICD-10-CM | POA: Diagnosis not present

## 2014-03-24 DIAGNOSIS — Z87891 Personal history of nicotine dependence: Secondary | ICD-10-CM | POA: Diagnosis not present

## 2014-03-24 DIAGNOSIS — I429 Cardiomyopathy, unspecified: Secondary | ICD-10-CM

## 2014-03-24 DIAGNOSIS — Z8673 Personal history of transient ischemic attack (TIA), and cerebral infarction without residual deficits: Secondary | ICD-10-CM | POA: Diagnosis not present

## 2014-03-24 DIAGNOSIS — Z954 Presence of other heart-valve replacement: Secondary | ICD-10-CM | POA: Insufficient documentation

## 2014-03-24 DIAGNOSIS — Z950 Presence of cardiac pacemaker: Secondary | ICD-10-CM | POA: Diagnosis not present

## 2014-03-24 DIAGNOSIS — D6859 Other primary thrombophilia: Secondary | ICD-10-CM | POA: Insufficient documentation

## 2014-03-24 DIAGNOSIS — E039 Hypothyroidism, unspecified: Secondary | ICD-10-CM | POA: Insufficient documentation

## 2014-03-24 DIAGNOSIS — I4892 Unspecified atrial flutter: Secondary | ICD-10-CM | POA: Diagnosis not present

## 2014-03-24 DIAGNOSIS — I4891 Unspecified atrial fibrillation: Secondary | ICD-10-CM | POA: Insufficient documentation

## 2014-03-24 DIAGNOSIS — I428 Other cardiomyopathies: Secondary | ICD-10-CM | POA: Diagnosis not present

## 2014-03-24 DIAGNOSIS — I0981 Rheumatic heart failure: Secondary | ICD-10-CM | POA: Insufficient documentation

## 2014-03-24 MED ORDER — METOPROLOL SUCCINATE ER 50 MG PO TB24: ORAL_TABLET | ORAL | Status: DC

## 2014-03-24 MED ORDER — FUROSEMIDE 40 MG PO TABS: 40.0000 mg | ORAL_TABLET | Freq: Every day | ORAL | Status: DC

## 2014-03-24 NOTE — Patient Instructions (Signed)
Increase Furosemide (Lasix) to 40 mg daily  Increase Metoprolol to 50 mg in AM and 25 mg in PM  STOP Diltiazem   Your physician has requested that you have a lexiscan myoview. For further information please visit [email or web address]. Please follow instruction sheet, as given.  Your physician recommends that you schedule a follow-up appointment in: 1

## 2014-03-24 NOTE — Progress Notes (Signed)
Patient ID: [name], female   DOB: Jan 01, 1946, 68 y.o.   MRN: [id number] PCP: Dr. Inda Merlin Primary Cardiologist: Dr. Stanford Breed Hematologist: Dr. Beryle Beams  68 yo with extensive past history including mechanical MV and TV repair, chronic systolic CHF with EF 69-67%, chronic atrial fibrillation, prior CVA, antiphospholipid antibody syndrome, subdural hematomas x 2, and CKD presents for CHF clinic evaluation.  Patient had initial valvuloplasty at [facility] in 1995.  She later had mechanical MV replacement and TV repair complicated by CVA with left hemiparesis.  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  She is anticoagulated now with fondaparinux as she had subdural hematomas x 2 while on coumadin.  She is not on ASA 81 with low platelets. She has permanent atrial fibrillation (has failed cardioversion, cannot get amiodarone as she had torsades in the past with amiodarone use).    Until about 5/15, she was doing relatively well.  She was working out with a Clinical research associate several days a week at the gym with no significant problems. Within a 2 week period in 5/15, she developed profound exertional dyspnea.  She does not know any particular trigger for this.  Echo in 6/15 showed a fall in EF to 35-40%.  Valves looked ok.  No chest pain.  She felt like her dyspnea was similar to prior to placement of BiV pacemaker.  However, she was was 96% BiV paced in 8/15 despite her chronic atrial fibrillation.  She was started on Lasix and it was titrated to 40 mg daily.  However, she became hypotensive with AKI and Lasix was stopped.  It was restarted at 20 mg daily about a week ago.  She has tolerated it well so far.  Currently, she does ok walking on flat ground but is short of breath walking up an incline or up steps.  Her energy level is poor.  However, she still goes to the gym and rows and walks on the treadmill.  She has orthopnea but no PND.  HR is controlled.   ECG: atrial fibrillation with BiV pacing  Labs (9/15): K 4.5,  creatinine 1.98, HCT 30.8, plts 66  PMH: 1. Rheumatic heart disease: Mitral valvuloplasty 1995 at [facility].  Later had mechanical MV replacement and tricuspid annuloplasty at [facility].   2. CVA: Embolic, at time of mitral valve surgery.  Has left hemiparesis.  3. H/o HIT 4. 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  5. Hypothyroidism: Hashimoto's thyroiditis.  6. Atypical atrial flutter: s/p ablation in 8/12 and repeat in 9/12 [facility]).  7. Chronic atrial fibrillation 8. Torsades de pointes with amiodarone use.  9. Antiphospholipid antibody syndrome: Anticoagulated with fondaparinus.  10. CKD 11. Chronic systolic CHF: [employer] CRT-D device.  Echo (4/15) with EF 45-50%.  Echo (6/15) with EF 35-40%, diffuse hypokinesis, normal mechanical mitral valve with mild MR, tricuspid valve s/p repair.   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   FH: No premature CAD  ROS: All systems reviewed and negative except as per HPI.   Current Outpatient Prescriptions  Medication Sig Dispense Refill  . acetaminophen (TYLENOL) 650 MG CR tablet Take 650 mg by mouth as needed.       . AMOXICILLIN PO Take 4 capsules by mouth as directed. ONLY WHEN VISITING DENTIST      . Calcium-Magnesium (CAL-MAG PO) Take 1 tablet by mouth daily.       . Cholecalciferol (VITAMIN D) 2000 UNITS tablet Take 2,000 Units by mouth daily.        Marland Kitchen  Estradiol Acetate (FEMRING) 0.05 MG/24HR RING Place 1 each vaginally as directed.       . fish oil-omega-3 fatty acids 1000 MG capsule Take 1 g by mouth daily.       . fondaparinux (ARIXTRA) 5 MG/0.4ML SOLN injection Inject 0.4 mLs (5 mg total) into the skin daily. Use as directed  1.6 mL  11  . furosemide (LASIX) 40 MG tablet Take 1 tablet (40 mg total) by mouth daily.  30 tablet  3  . levothyroxine (SYNTHROID, LEVOTHROID) 88 MCG tablet Take 88 mcg by mouth daily before breakfast.      . metoprolol succinate (TOPROL-XL) 50 MG 24 hr tablet  Take 1 tab in AM and 1/2 tab in PM  90 tablet  3  . valACYclovir (VALTREX) 1000 MG tablet Take 1,000 mg by mouth 2 (two) times daily. For a total of 3 doses for labial herpes prn only       Current Facility-Administered Medications  Medication Dose Route Frequency Provider Last Rate Last Dose  . botulinum toxin Type A (BOTOX) injection 100 Units  100 Units Intramuscular Once Hulen Luster, DO      . botulinum toxin Type A (BOTOX) injection 25 Units  25 Units Intramuscular Once Hulen Luster, DO      . botulinum toxin Type A (BOTOX) injection 40 Units  40 Units Intramuscular Once [employer], DO        BP 114/68  Pulse 112  Wt 157 lb 1.9 oz (71.269 kg)  SpO2 98% General: NAD Neck: JVP 14 cm, no thyromegaly or thyroid nodule.  Lungs: Clear to auscultation bilaterally with normal respiratory effort. CV: Nondisplaced PMI.  Heart regular S1/S2, mechanical S1, no S3/S4, 1/6 SEM RUSB.  Trace ankle edema.  No carotid bruit.  Normal pedal pulses.  Abdomen: Soft, nontender, no hepatosplenomegaly, no distention.  Skin: Intact without lesions or rashes.  Neurologic: Alert and oriented x 3.  Psych: Normal affect. Extremities: No clubbing or cyanosis.  HEENT: Normal.   Assessment/Plan:  1. Rheumatic heart disease: S/p MV replacement (mechanical) and TV repair.  The valves looked ok on echo in 6/15 without evidence for malfunction.  She is anticoagulated with fondaparinux chronically given subdural hematomas x 2 on coumadin.  She is not on aspirin given low platelets.   2. Atrial fibrillation: Permanent.  Patient has failed DCCV and amiodarone caused torsades.  Rate is controlled.  PCM check in 8/15 showed 96% BiV pacing. As above, she is on fondaprinux. 3. CKD: Last creatinine 1.98, which is at baseline.  Will need to follow closely with diuresis.  4. Chronic systolic CHF: EF 02-72% on 6/15 echo.  This is decreased from the past.  She developed worsened dyspnea over a period of  about 2 weeks for no clear reason.  On exam today she is quite volume overloaded.  She has a Research officer, political party CRT-D device.  - I will get a Lexiscan Cardiolite to make sure that ischemia or infarction did not play a role in clinical deterioration.  - We need to carefully increase her diuresis.  Increase Lasix to 40 mg daily with BMET and office followup in 1 week.  - I would prefer her not to be on diltiazem CD (negative inotrope).  I will have her stop this and increase Toprol XL to 50 qam, 25 qpm.   - Follow low sodium diet.  - I expect she will need RHC but will see if we can diuresis her some and  also get stress test first.  5. APLAS: Per Dr. Beryle Beams.   Loralie Champagne 03/24/2014

## 2014-03-31 ENCOUNTER — Ambulatory Visit (HOSPITAL_COMMUNITY): Payer: Medicare Other | Attending: Cardiology | Admitting: Radiology

## 2014-03-31 VITALS — BP 112/79 | HR 83 | Ht 63.0 in | Wt 153.0 lb

## 2014-03-31 DIAGNOSIS — I429 Cardiomyopathy, unspecified: Secondary | ICD-10-CM | POA: Diagnosis not present

## 2014-03-31 DIAGNOSIS — I639 Cerebral infarction, unspecified: Secondary | ICD-10-CM | POA: Diagnosis not present

## 2014-03-31 DIAGNOSIS — R0600 Dyspnea, unspecified: Secondary | ICD-10-CM | POA: Diagnosis not present

## 2014-03-31 DIAGNOSIS — E039 Hypothyroidism, unspecified: Secondary | ICD-10-CM | POA: Diagnosis not present

## 2014-03-31 DIAGNOSIS — I10 Essential (primary) hypertension: Secondary | ICD-10-CM | POA: Insufficient documentation

## 2014-03-31 DIAGNOSIS — R0602 Shortness of breath: Secondary | ICD-10-CM

## 2014-03-31 DIAGNOSIS — D631 Anemia in chronic kidney disease: Secondary | ICD-10-CM | POA: Diagnosis not present

## 2014-03-31 DIAGNOSIS — N189 Chronic kidney disease, unspecified: Secondary | ICD-10-CM | POA: Diagnosis not present

## 2014-03-31 MED ORDER — TECHNETIUM TC 99M SESTAMIBI GENERIC - CARDIOLITE
33.0000 | Freq: Once | INTRAVENOUS | Status: AC | PRN
  Administered 2014-03-31: 33 via INTRAVENOUS

## 2014-03-31 MED ORDER — ADENOSINE (DIAGNOSTIC) 3 MG/ML IV SOLN
0.5600 mg/kg | Freq: Once | INTRAVENOUS | Status: AC
  Administered 2014-03-31: 39 mg via INTRAVENOUS

## 2014-03-31 MED ORDER — TECHNETIUM TC 99M SESTAMIBI GENERIC - CARDIOLITE
11.0000 | Freq: Once | INTRAVENOUS | Status: AC | PRN
  Administered 2014-03-31: 11 via INTRAVENOUS

## 2014-03-31 NOTE — Progress Notes (Signed)
[facility] [address] [place], [place] [address] [phone]    Cardiology Nuclear Med Study  [name] is a 68 y.o. female     MRN : [id number]     DOB: 10-22-1945  Procedure Date: 03/31/2014  Nuclear Med Background Indication for Stress Test:  Evaluation for Ischemia History:  No known CAD Cardiac Risk Factors: CVA and Hypertension  Symptoms:  DOE   Nuclear Pre-Procedure Caffeine/Decaff Intake:  None> 12 hrs NPO After: 7:00pm   Lungs:  clear O2 Sat: 98% on room air. IV 0.9% NS with Angio Cath:  22g  IV Site: L Antecubital x 1, tolerated well IV Started by:  Irven Baltimore, RN  Chest Size (in):  38 Cup Size: C  Height: 5\' 3"  (1.6 m)  Weight:  153 lb (69.4 kg)  BMI:  Body mass index is 27.11 kg/(m^2). Tech Comments:  Patient held Toprol this am. Irven Baltimore, RN.    Nuclear Med Study 1 or 2 day study: 1 day  Stress Test Type:  Adenosine  Reading MD: N/A  Order Authorizing Provider:  Loralie Champagne, MD, and Kirk Ruths, MD  Resting Radionuclide: Technetium 31m Sestamibi  Resting Radionuclide Dose: 11.0 mCi   Stress Radionuclide:  Technetium 32m Sestamibi  Stress Radionuclide Dose: 33.0 mCi           Stress Protocol Rest HR: 83 Stress HR: 85  Rest BP: 112/79 Stress BP: 92/60  Exercise Time (min): n/a METS: n/a           Dose of Adenosine (mg):  38.9 mg Dose of Lexiscan: n/a mg  Dose of Atropine (mg): n/a Dose of Dobutamine: n/a mcg/kg/min (at max HR)  Stress Test Technologist: Glade Lloyd, BS-ES  Nuclear Technologist:  Earl Many, CNMT     Rest Procedure:  Myocardial perfusion imaging was performed at rest 45 minutes following the intravenous administration of Technetium 46m Sestamibi. Rest ECG: Biventricular pacing is present  Stress Procedure:  The patient received IV adenosine at 140 mcg/kg/min for 4 minutes.  Technetium 3m Sestamibi was injected at the 2 minute mark and quantitative spect images were obtained  after a 45 minute delay.  During the infusion of Adenosine the patient complained of lightheadedness and SOB.  These symptoms resolved in recovery.  Stress ECG: No significant change from baseline ECG  QPS Raw Data Images:  Normal; no motion artifact; normal heart/lung ratio. Stress Images:  Medium size area of moderate decreased uptake affecting the anterior-apical segment, septal-apical segment, and the apical cap. This area is fixed Rest Images:  Rest images are the same as stress images. Subtraction (SDS):  No evidence of ischemia. Transient Ischemic Dilatation (Normal <1.22):  0.92 Lung/Heart Ratio (Normal <0.45):  0.40  Quantitative Gated Spect Images QGS EDV:  126 ml QGS ESV:  82 ml  Impression Exercise Capacity:  Adenosine study with no exercise. BP Response:  Normal blood pressure response. Clinical Symptoms:  Patient felt lightheaded and short of breath ECG Impression:  No significant ST segment change suggestive of ischemia. Comparison with Prior Nuclear Study: No images to compare  Overall Impression:  There is a fixed defect affecting the apical-anterior segment, apical-septal segment, and the apical cap. This area is fixed. This is consistent with scar. It seems less likely that this area is caused by the biventricular pacing. There is no significant ischemia. The study is abnormal. This is a moderate risk scan.  LV Ejection Fraction: 35%.  LV Wall Motion:  Global hypokinesis. More marked hypokinesis of the septum.  Dola Argyle, MD

## 2014-04-01 ENCOUNTER — Ambulatory Visit (HOSPITAL_COMMUNITY)
Admission: RE | Admit: 2014-04-01 | Discharge: 2014-04-01 | Disposition: A | Discharge status: Home / Self Care | Payer: Medicare Other | Source: Ambulatory Visit | Attending: Cardiology | Admitting: Cardiology

## 2014-04-01 ENCOUNTER — Encounter (HOSPITAL_COMMUNITY): Payer: Self-pay

## 2014-04-01 VITALS — BP 96/60 | HR 104 | Wt 153.1 lb

## 2014-04-01 DIAGNOSIS — N189 Chronic kidney disease, unspecified: Secondary | ICD-10-CM | POA: Diagnosis not present

## 2014-04-01 DIAGNOSIS — I482 Chronic atrial fibrillation: Secondary | ICD-10-CM | POA: Diagnosis not present

## 2014-04-01 DIAGNOSIS — Z952 Presence of prosthetic heart valve: Secondary | ICD-10-CM | POA: Insufficient documentation

## 2014-04-01 DIAGNOSIS — I4891 Unspecified atrial fibrillation: Secondary | ICD-10-CM

## 2014-04-01 DIAGNOSIS — Z9889 Other specified postprocedural states: Secondary | ICD-10-CM

## 2014-04-01 DIAGNOSIS — Z79899 Other long term (current) drug therapy: Secondary | ICD-10-CM | POA: Insufficient documentation

## 2014-04-01 DIAGNOSIS — Z95810 Presence of automatic (implantable) cardiac defibrillator: Secondary | ICD-10-CM | POA: Diagnosis not present

## 2014-04-01 DIAGNOSIS — E039 Hypothyroidism, unspecified: Secondary | ICD-10-CM | POA: Diagnosis not present

## 2014-04-01 DIAGNOSIS — Z950 Presence of cardiac pacemaker: Secondary | ICD-10-CM | POA: Diagnosis not present

## 2014-04-01 DIAGNOSIS — I0981 Rheumatic heart failure: Secondary | ICD-10-CM | POA: Insufficient documentation

## 2014-04-01 DIAGNOSIS — Z8673 Personal history of transient ischemic attack (TIA), and cerebral infarction without residual deficits: Secondary | ICD-10-CM | POA: Diagnosis not present

## 2014-04-01 DIAGNOSIS — I130 Hypertensive heart and chronic kidney disease with heart failure and stage 1 through stage 4 chronic kidney disease, or unspecified chronic kidney disease: Secondary | ICD-10-CM | POA: Insufficient documentation

## 2014-04-01 DIAGNOSIS — I5022 Chronic systolic (congestive) heart failure: Secondary | ICD-10-CM | POA: Diagnosis not present

## 2014-04-01 DIAGNOSIS — D6861 Antiphospholipid syndrome: Secondary | ICD-10-CM | POA: Diagnosis not present

## 2014-04-01 DIAGNOSIS — Z7901 Long term (current) use of anticoagulants: Secondary | ICD-10-CM | POA: Diagnosis not present

## 2014-04-01 DIAGNOSIS — I429 Cardiomyopathy, unspecified: Secondary | ICD-10-CM | POA: Diagnosis not present

## 2014-04-01 DIAGNOSIS — I5042 Chronic combined systolic (congestive) and diastolic (congestive) heart failure: Secondary | ICD-10-CM

## 2014-04-01 MED ORDER — METOPROLOL SUCCINATE ER 50 MG PO TB24: 50.0000 mg | ORAL_TABLET | Freq: Two times a day (BID) | ORAL | Status: DC

## 2014-04-01 NOTE — Progress Notes (Signed)
Patient ID: [name], female   DOB: 09/23/1945, 68 y.o.   MRN: [id number] PCP: Dr. Inda Merlin Primary Cardiologist: Dr. Stanford Breed Hematologist: Dr. Beryle Beams  68 yo with extensive past history including mechanical MV and TV repair, chronic systolic CHF with EF 88-28%, chronic atrial fibrillation, prior CVA, antiphospholipid antibody syndrome, subdural hematomas x 2, and CKD presents for CHF clinic evaluation.  Patient had initial valvuloplasty at [facility] in 1995.  She later had mechanical MV replacement and TV repair complicated by CVA with left hemiparesis.  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  She is anticoagulated now with fondaparinux as she had subdural hematomas x 2 while on coumadin.  She is not on ASA 81 with low platelets. She has permanent atrial fibrillation (has failed cardioversion, cannot get amiodarone as she had torsades in the past with amiodarone use).    Until about 5/15, she was doing relatively well.  She was working out with a Clinical research associate several days a week at the gym with no significant problems. Within a 2 week period in 5/15, she developed profound exertional dyspnea.  She does not know any particular trigger for this.  Echo in 6/15 showed a fall in EF to 35-40%.  Valves looked ok.  No chest pain.  She felt like her dyspnea was similar to prior to placement of BiV pacemaker.  However, she was was 96% BiV paced in 8/15 despite her chronic atrial fibrillation.  She was started on Lasix and it was titrated to 40 mg daily.  However, she became hypotensive with AKI and Lasix was stopped.  It was restarted at 20 mg daily in 9/15.    At last appointment, diltiazem was stopped and she was transitioned to Toprol XL.  Lasix was increased to 40 mg daily.  She had an adenosine Cardiolite that showed EF 35% with fixed defect, no ischemia.  This was thought to be scar versus pacing artifact. Since Lasix was increased, weight is down 4 lbs.  She is breathing better.  She was able to ride an exercise  bicycle 2 miles without problems.  No dyspnea walking up steps.   No chest pain.  Creatinine recently was slightly down but BUN was higher.    Labs (9/15): K 4.5, creatinine 1.98, HCT 30.8, plts 66 Labs (10/15): K 4.2, BUN 69, creatinine 1.89 (Dr Sanda Klein office)  PMH: 1. Rheumatic heart disease: Mitral valvuloplasty 1995 at [facility].  Later had mechanical MV replacement and tricuspid annuloplasty at [facility].   2. CVA: Embolic, at time of mitral valve surgery.  Has left hemiparesis.  3. H/o HIT 4. 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  5. Hypothyroidism: Hashimoto's thyroiditis.  6. Atypical atrial flutter: s/p ablation in 8/12 and repeat in 9/12 [facility]).  7. Chronic atrial fibrillation 8. Torsades de pointes with amiodarone use.  9. Antiphospholipid antibody syndrome: Anticoagulated with fondaparinus.  10. CKD 11. Chronic systolic CHF: [employer] CRT-D device.  Echo (4/15) with EF 45-50%.  Echo (6/15) with EF 35-40%, diffuse hypokinesis, normal mechanical mitral valve with mild MR, tricuspid valve s/p repair.  - Adenosine Cardiolite (10/15) with EF 35%, fixed apical anterior, apical septal, and apical defect with no ischemia.  Scar versus pacing artifact.   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   FH: No premature CAD  ROS: All systems reviewed and negative except as per HPI.   Current Outpatient Prescriptions  Medication Sig Dispense Refill  . acetaminophen (TYLENOL) 650 MG  CR tablet Take 650 mg by mouth as needed.       . AMOXICILLIN PO Take 4 capsules by mouth as directed. ONLY WHEN VISITING DENTIST      . Calcium-Magnesium (CAL-MAG PO) Take 1 tablet by mouth daily.       . Cholecalciferol (VITAMIN D) 2000 UNITS tablet Take 2,000 Units by mouth daily.        . Estradiol Acetate (FEMRING) 0.05 MG/24HR RING Place 1 each vaginally as directed.       . fish oil-omega-3 fatty acids 1000 MG capsule Take 1 g by mouth daily.       .  fondaparinux (ARIXTRA) 5 MG/0.4ML SOLN injection Inject 0.4 mLs (5 mg total) into the skin daily. Use as directed  1.6 mL  11  . furosemide (LASIX) 40 MG tablet Take 1 tablet (40 mg total) by mouth daily.  30 tablet  3  . levothyroxine (SYNTHROID, LEVOTHROID) 88 MCG tablet Take 88 mcg by mouth daily before breakfast.      . metoprolol succinate (TOPROL-XL) 50 MG 24 hr tablet Take 1 tablet (50 mg total) by mouth 2 (two) times daily.  60 tablet  3  . valACYclovir (VALTREX) 1000 MG tablet Take 1,000 mg by mouth 2 (two) times daily. For a total of 3 doses for labial herpes prn only       Current Facility-Administered Medications  Medication Dose Route Frequency Provider Last Rate Last Dose  . botulinum toxin Type A (BOTOX) injection 100 Units  100 Units Intramuscular Once Hulen Luster, DO      . botulinum toxin Type A (BOTOX) injection 25 Units  25 Units Intramuscular Once Hulen Luster, DO      . botulinum toxin Type A (BOTOX) injection 40 Units  40 Units Intramuscular Once [employer], DO        BP 96/60  Pulse 104  Wt 153 lb 1.9 oz (69.455 kg)  SpO2 98% General: NAD Neck: JVP 8-9 cm, no thyromegaly or thyroid nodule.  Lungs: Clear to auscultation bilaterally with normal respiratory effort. CV: Nondisplaced PMI.  Heart regular S1/S2, mechanical S1, no S3/S4, 1/6 SEM RUSB.  No edema.  No carotid bruit.  Normal pedal pulses.  Abdomen: Soft, nontender, no hepatosplenomegaly, no distention.  Skin: Intact without lesions or rashes.  Neurologic: Alert and oriented x 3.  Psych: Normal affect. Extremities: No clubbing or cyanosis.  HEENT: Normal.   Assessment/Plan:  1. Rheumatic heart disease: S/p MV replacement (mechanical) and TV repair.  The valves looked ok on echo in 6/15 without evidence for malfunction.  She is anticoagulated with fondaparinux chronically given subdural hematomas x 2 on coumadin.  She is not on aspirin given low platelets.   2. Atrial fibrillation:  Permanent.  Patient has failed DCCV and amiodarone caused torsades.  PCM check in 8/15 showed 96% BiV pacing. As above, she is on fondaprinux.  Rate is in the 100s today.  I will increase Toprol XL to 50 mg bid.  3. CKD: Last creatinine was lower at 1.89 but BUN was higher.  I will repeat BMET in 1 week.  4. Chronic systolic CHF: EF 03-88% on 6/15 echo.  This is decreased from the past.  She developed worsened dyspnea over a period of about 2 weeks for no clear reason.  She has a Research officer, political party CRT-D device.  Adenosine Cardiolite showed EF 35% with a fixed apical defect that may be scar or pacing artifact, no ischemia. Weight is  down and volume status is better on exam.  - I will continue the current Lasix for now but will repeat BMET in 1 week to follow closely.  - As above, increase Toprol XL to 50 mg bid.  - No ACEI with CKD.  - Follow low sodium diet.  - Will hold off on RHC for now given improvement.  5. APLAS: Per Dr. Beryle Beams.   Followup in 1 month  Loralie Champagne 04/01/2014

## 2014-04-01 NOTE — Patient Instructions (Signed)
INCREASE Toprol to 50 mg twice a day  Labs needed in one week (BMET)  Your physician recommends that you schedule a follow-up appointment in: 1 month  Do the following things EVERYDAY: 1) Weigh yourself in the morning before breakfast. Write it down and keep it in a log. 2) Take your medicines as prescribed 3) Eat low salt foods-Limit salt (sodium) to 2000 mg per day.  4) Stay as active as you can everyday 5) Limit all fluids for the day to less than 2 liters 6)

## 2014-04-01 NOTE — Addendum Note (Signed)
Encounter addended by: Kerry Dory, CMA on: 04/01/2014  4:27 PM<BR>     Documentation filed: Patient Instructions Section

## 2014-04-01 NOTE — Addendum Note (Signed)
Encounter addended by: Larey Dresser, MD on: 04/01/2014 10:44 PM<BR>     Documentation filed: Problem List, Visit Diagnoses, Follow-up Section, LOS Section, Clinical Notes

## 2014-04-06 ENCOUNTER — Ambulatory Visit (HOSPITAL_COMMUNITY)
Admission: RE | Admit: 2014-04-06 | Discharge: 2014-04-06 | Disposition: A | Discharge status: Home / Self Care | Payer: Medicare Other | Source: Ambulatory Visit | Attending: Cardiology | Admitting: Cardiology

## 2014-04-06 DIAGNOSIS — I5022 Chronic systolic (congestive) heart failure: Secondary | ICD-10-CM

## 2014-04-06 DIAGNOSIS — I5042 Chronic combined systolic (congestive) and diastolic (congestive) heart failure: Secondary | ICD-10-CM

## 2014-04-06 LAB — BASIC METABOLIC PANEL
Anion gap: 16 — ABNORMAL HIGH (ref 5–15)
BUN: 57 mg/dL — ABNORMAL HIGH (ref 6–23)
CO2: 24 mEq/L (ref 19–32)
Calcium: 9.2 mg/dL (ref 8.4–10.5)
Chloride: 100 mEq/L (ref 96–112)
Creatinine, Ser: 1.81 mg/dL — ABNORMAL HIGH (ref 0.50–1.10)
GFR, EST AFRICAN AMERICAN: 32 mL/min — AB (ref >90)
GFR, EST NON AFRICAN AMERICAN: 28 mL/min — AB (ref >90)
Glucose, Bld: 118 mg/dL — ABNORMAL HIGH (ref 70–99)
POTASSIUM: 4.1 meq/L (ref 3.7–5.3)
SODIUM: 140 meq/L (ref 137–147)

## 2014-04-08 ENCOUNTER — Other Ambulatory Visit (HOSPITAL_COMMUNITY): Payer: Medicare Other

## 2014-04-13 DIAGNOSIS — H5203 Hypermetropia, bilateral: Secondary | ICD-10-CM | POA: Diagnosis not present

## 2014-04-13 DIAGNOSIS — H53002 Unspecified amblyopia, left eye: Secondary | ICD-10-CM | POA: Diagnosis not present

## 2014-04-14 ENCOUNTER — Ambulatory Visit: Payer: Medicare Other | Admitting: Oncology

## 2014-04-21 ENCOUNTER — Encounter: Payer: Self-pay | Admitting: Oncology

## 2014-04-21 ENCOUNTER — Other Ambulatory Visit: Payer: Self-pay | Admitting: Oncology

## 2014-04-21 ENCOUNTER — Ambulatory Visit (INDEPENDENT_AMBULATORY_CARE_PROVIDER_SITE_OTHER): Payer: Medicare Other | Admitting: Oncology

## 2014-04-21 VITALS — BP 98/71 | HR 100 | Temp 97.7°F | Ht 63.0 in | Wt 156.8 lb

## 2014-04-21 DIAGNOSIS — Z9889 Other specified postprocedural states: Secondary | ICD-10-CM

## 2014-04-21 DIAGNOSIS — D6861 Antiphospholipid syndrome: Secondary | ICD-10-CM

## 2014-04-21 DIAGNOSIS — N183 Chronic kidney disease, stage 3 unspecified: Secondary | ICD-10-CM

## 2014-04-21 DIAGNOSIS — Z7901 Long term (current) use of anticoagulants: Secondary | ICD-10-CM

## 2014-04-21 DIAGNOSIS — D6859 Other primary thrombophilia: Secondary | ICD-10-CM

## 2014-04-21 DIAGNOSIS — D696 Thrombocytopenia, unspecified: Secondary | ICD-10-CM

## 2014-04-21 NOTE — Patient Instructions (Signed)
Lab at cancer center 11/16 Visit with Dr Beryle Beams 3-4 months

## 2014-04-21 NOTE — Progress Notes (Signed)
Patient ID: [name], female   DOB: 05-29-1946, 68 y.o.   MRN: [id number] Hematology and Oncology Follow Up Visit  [name] [id number] 1945/11/10 68 y.o. 04/21/2014 4:28 PM   Principle Diagnosis: Encounter Diagnoses  Name Primary?  . Thrombocytopenia Yes  . Chronic kidney disease, stage 3 (moderate)   . MITRAL VALVE REPLACEMENT, HX OF   . Antiphospholipid antibody with hypercoagulable state   . Chronic anticoagulation   . 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 She has had recurrent thrombotic events including an episode of amaurosis fugax and a TIA in the past. She has chronic moderate thrombocytopenia. She developed advanced mitral valve disease and had to undergo an emergency mitral valve replacement when she went into acute congestive heart failure with a concomitant DIC picture. She had a mechanical mitral valve replacement. 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 She developed chronic renal insufficiency.  She suffered a subdural hematoma requiring emergency evacuation while on Coumadin. Attempts to go back on Coumadin resulted in a recurrent subdural. She had an anaphylactic reaction to vitamin K in attempt to reverse the Coumadin at time of the second intracranial bleed. 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  She has been maintained on Arixtra injections 2.5 mg every other day despite her chronic renal dysfunction in view of the Coumadin related hemorrhages in the history of heparin-induced thrombocytopenia  .  Despite the mitral valve replacement, she has had refractory atrial arrhythmias. She has required DC cardioversion and a number of attempts at radiofrequency ablation. Most  recently she had to have a pacemaker placed. Her cardiologist told her that she is still in atrial fibrillation.   Current update: Dr. Kathyrn Drown has been struggling with profound fatigue for the last few months. I do not feel that this is related to her chronic anemia with hemoglobins consistently running 10-11 grams on a chronic basis. I did go ahead and screen her for hemolysis and for iron deficiency. She does have a elevated LDH. Bilirubin higher than her baseline of 1.01 but still well within normal range. Transaminases are normal. However, this still might be some passive congestion. Most recent echocardiogram done 12/03/2013 shows a decline in her left ventricular ejection fraction down to 35-40% with diffuse hypokinesis compared  with 50% on an April 2014 study. She is still in atrial fibrillation despite previous attempts at cardioversion. She now has a biventricular pacer in place. Blood pressures have been running low. Blood pressure today 98/71. I think clearly her fatigue is related to her cardiac status. She has been getting optimal medical management with Dr. Stanford Breed and Aundra Dubin. She is still trying to do some exercise. She gets dyspneic on exertion and on an incline.  Platelet count lower than her baseline at 66,000. No clinical bleeding. She remains on Arixtra anticoagulation. Renal function has improved slightly in the last few months down from a peak creatinine of 2.4 in June to most recent  value of 1.8 on October 12.   Medications: reviewed  Allergies:  Allergies  Allergen Reactions  . Amiodarone Hcl Other (See Comments)    "torsades; v tach"  . 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 Vitamin K Anaphylaxis    IV only allergy  . Iodinated Diagnostic Agents Itching    "  over 35 years ago" (03/28/2012)    Review of Systems: See history of present illness Remaining ROS negative:   Physical Exam: Blood pressure 98/71, pulse 100, temperature 97.7 F (36.5 C), temperature  source Oral, height 5\' 3"  (1.6 m), weight 156 lb 12.8 oz (71.124 kg), SpO2 100.00%. Wt Readings from Last 3 Encounters:  04/21/14 156 lb 12.8 oz (71.124 kg)  04/01/14 153 lb 1.9 oz (69.455 kg)  03/31/14 153 lb (69.4 kg)     General appearance: well nourished caucasian woman HENNT: Pharynx no erythema, exudate, mass, or ulcer. No thyromegaly or thyroid nodules Lymph nodes: No cervical, supraclavicular, or axillary lymphadenopathy Breasts:  Lungs: Clear to auscultation, resonant to percussion throughout Heart: Regular rhythm, no murmur, no gallop, no rub, no click, no edema Abdomen: Soft, nontender, normal bowel sounds, no mass, no organomegaly Extremities: No edema, no calf tenderness Musculoskeletal: no joint deformities GU:  Vascular: Carotid pulses 2+, no bruits,  Neurologic: Alert, oriented, PERRLA, optic discs chronically blurred and vessels normal, no hemorrhage or exudate, cranial nerves grossly normal, motor strength 5 over 5, reflexes 1+ symmetric, upper body coordination normal, gait normal, Skin: No rash or ecchymosis  Lab Results: CBC W/Diff    Component Value Date/Time   WBC 7.9 03/16/2014 1526   WBC 4.6 03/19/2012 1652   RBC 3.24* 03/16/2014 1526   RBC 3.45* 03/19/2012 1652   RBC 2.78* 07/25/2007 1330   HGB 9.8* 03/16/2014 1526   HGB 10.8* 03/19/2012 1652   HCT 30.8* 03/16/2014 1526   HCT 32.8* 03/19/2012 1652   PLT 66* 03/16/2014 1526   PLT 82.0* 03/19/2012 1652   MCV 95.1 03/16/2014 1526   MCV 94.9 03/19/2012 1652   MCH 30.2 03/16/2014 1526   MCH 32.0 07/04/2010 1458   MCHC 31.8 03/16/2014 1526   MCHC 33.0 03/19/2012 1652   RDW 14.4 03/16/2014 1526   RDW 12.4 03/19/2012 1652   LYMPHSABS 0.9 03/16/2014 1526   LYMPHSABS 1.2 03/19/2012 1652   MONOABS 0.3 03/16/2014 1526   MONOABS 0.3 03/19/2012 1652   EOSABS 0.4 03/16/2014 1526   EOSABS 0.2 03/19/2012 1652   BASOSABS 0.1 03/16/2014 1526   BASOSABS 0.0 03/19/2012 1652     Chemistry      Component Value Date/Time   NA 140  04/06/2014 1255   NA 143 03/16/2014 1528   K 4.1 04/06/2014 1255   K 3.7 03/16/2014 1528   CL 100 04/06/2014 1255   CL 108* 11/04/2012 1158   CO2 24 04/06/2014 1255   CO2 24 03/16/2014 1528   BUN 57* 04/06/2014 1255   BUN 46.0* 03/16/2014 1528   CREATININE 1.81* 04/06/2014 1255   CREATININE 1.98* 03/19/2014 0844   CREATININE 2.2* 03/16/2014 1528      Component Value Date/Time   CALCIUM 9.2 04/06/2014 1255   CALCIUM 9.5 03/16/2014 1528   ALKPHOS 111 03/16/2014 1528   ALKPHOS 61 02/05/2012 1042   AST 35* 03/16/2014 1528   AST 24 02/05/2012 1042   ALT 34 03/16/2014 1528   ALT 18 02/05/2012 1042   BILITOT 1.01 03/16/2014 1528   BILITOT 0.5 02/05/2012 1042       Impression:  #1. Antiphospholipid antibody syndrome status post multiple thrombotic events  She remains stable on Arixtra anticoagulation.  Plan: Continue long-term anticoagulation.  Target Arixtra level 0.8-1.1. She currently takes a single additional 5 mg dose once aweek and   2.5 mg every other day   #2. Chronic  thrombocytopenia secondary to #1. Currently lower than her usual  baseline. No obvious reason for this.  We'll continue to monitor. #3. Chronic renal insufficiency likely due to perioperative complications at time of mitral valve replacement in [id number] 9 complicated by low output cardiac failure..  #4. Mitral valve disease likely secondary to #1. Status post mechanical valve replacement 2007  #5. Atrial fibrillation/flutter- now status post pacemaker implantation having failed DC cardioversion and a radiofrequency ablation procedure.  #6. Essential hypertension  #7. Hypothyroid on replacement #8. Profound fatigue: I believe this is related to low output cardiac failure     CC: Patient Care Team: Josetta Huddle, MD as PCP - General (Internal Medicine) Annia Belt, MD as Consulting Physician (Oncology) Jamal Maes, MD as Consulting Physician (Nephrology) Lelon Perla, MD as Consulting Physician  (Cardiology) Thompson Grayer, MD as Consulting Physician (Cardiology)   Annia Belt, MD 10/27/20154:28 PM

## 2014-04-27 NOTE — Progress Notes (Signed)
HPI: FU rheumatic heart disease, atrial tachycardia/flutter, diastolic congestive heart failure. The patient had mitral valvuloplasty at [facility] in the late 90s. In 2007 she had a mechanical mitral valve replacement at [facility]. 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 She's had a prior subdural hematoma that required evacuation. She also has a history of heparin-induced thrombocytopenia. She is followed by Dr Rayann Heman for SVT and is s/p ablation at [facility]; also with h/o torsades with amiodarone. Has had previous biventricular pacemaker. Last echocardiogram in June 2015 showed worse EF 35-40, trace AI, MVR and biatrial enlargement. Nuclear study October 2015 showed an ejection fraction of 35%. There was a fixed anterior/apical defect consistent with scar. No ischemia. Patient recently developed worsening CHF symptoms. She was seen in the CHF and her Lasix was increased, Cardizem stopped and Toprol titrated. Since then, Her dyspnea has improved. She denies orthopnea, PND, pedal edema, chest pain or syncope.  Current Outpatient Prescriptions  Medication Sig Dispense Refill  . acetaminophen (TYLENOL) 650 MG CR tablet Take 650 mg by mouth as needed.     . Calcium-Magnesium (CAL-MAG PO) Take 1 tablet by mouth daily.     . Cholecalciferol (VITAMIN D) 2000 UNITS tablet Take 2,000 Units by mouth daily.      . Estradiol Acetate (FEMRING) 0.05 MG/24HR RING Place 1 each vaginally as directed.     . fish oil-omega-3 fatty acids 1000 MG capsule Take 1 g by mouth daily.     . fondaparinux (ARIXTRA) 5 MG/0.4ML SOLN injection Inject 0.4 mLs (5 mg total) into the skin daily. Use as directed 1.6 mL 11  . furosemide (LASIX) 40 MG tablet Take 1 tablet (40 mg total) by mouth daily. 30 tablet 3  . levothyroxine (SYNTHROID, LEVOTHROID) 88 MCG tablet Take 88 mcg by mouth daily before breakfast.    .  metoprolol succinate (TOPROL-XL) 50 MG 24 hr tablet Take 1 tablet (50 mg total) by mouth 2 (two) times daily. 60 tablet 3  . valACYclovir (VALTREX) 1000 MG tablet Take 1,000 mg by mouth 2 (two) times daily. For a total of 3 doses for labial herpes prn only    . AMOXICILLIN PO Take 4 capsules by mouth as directed. ONLY WHEN VISITING DENTIST     Current Facility-Administered Medications  Medication Dose Route Frequency Provider Last Rate Last Dose  . botulinum toxin Type A (BOTOX) injection 100 Units  100 Units Intramuscular Once Hulen Luster, DO      . botulinum toxin Type A (BOTOX) injection 25 Units  25 Units Intramuscular Once Hulen Luster, DO      . botulinum toxin Type A (BOTOX) injection 40 Units  40 Units Intramuscular Once Hulen Luster, DO         Past Medical History  Diagnosis Date  . Rheumatic heart disease 1995    post percutaneous valvuloplasty at [facility] with subsequent mechanical mitral valve replacement and tricuspid annuloplasty at [facility]  . Subdural hematoma 2009    on coumadin  . Heparin-induced thrombocytopenia   . HTN (hypertension)   . History of Hashimoto thyroiditis   . HLD (hyperlipidemia)   . Atrial flutter     atypical right atrial flutter s/p ablation at [facility] by Dr Clyda Hurdle  . 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 Pacemaker   . Antiphospholipid antibody syndrome   .  Anemia   . Heart murmur   . CHF (congestive heart failure)   . Hypothyroidism   . History of blood transfusion     "lots; most in 2007 w/MVR OR"  . Cerebrovascular accident ?1990; 1993; 2007    residual "can't use my right hand; left visual field cut" (03/28/2012)  . Arthritis     "may have some" (03/28/2012)  . Chronic kidney disease     "as a result of my heart failing" (03/28/2012)  . Fine motor impairment     "fingers right hand" (03/28/2012)  . Right leg swelling 10/06/2013    Asymmetric; no calf pain, venous doppler negative for  DVT 10/06/13    Past Surgical History  Procedure Laterality Date  . Mitral valve replacement  2007    with tricuspid annuloplasty  . Amputation      left atrial appendage amputated with MVR  . Craniotomy  2008    for SDH  . Cholecystectomy    . Tonsillectomy    . Percutaneous balloon valvuloplasty  1995    At [facility] with subsequent mechanical mitral valve replacement and tricuspid annuloplasty at [facility]  . Atrial ablation surgery      CTI and R atriostomy scar flutter ablation at [facility] 8/12,  repeat  ablation at [facility]  9/12  . Cardiac valve replacement    . Tonsillectomy      "when I was a child"  . Total abdominal hysterectomy  ?1995    TAH; BSO  . Biventricular pacemaker placement  03/28/2012    SJM Anthem implanted by Dr Lovena Le  . Cardioversion  2010-2011    "3 at [facility]" (03/28/2012)  . Cardiac electrophysiology mapping and ablation  2012    "2 @ [facility]" (03/28/2012    History   Social History  . Marital Status: Married    Spouse Name: Alease Medina     Number of Children: 0  . Years of Education: college   Occupational History  . PHYSICIAN     retired   Social History Main Topics  . Smoking status: Former Smoker -- 0.10 packs/day for 6 years    Types: Cigarettes  . Smokeless tobacco: Never Used     Comment: 03/28/2012 "smoked in the 1970's"  . Alcohol Use: 0.0 oz/week     Comment: wine rarely.  . Drug Use: No  . Sexual Activity: Not on file   Other Topics Concern  . 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  Quit smoking 25 years ago and quit alcohol 2 years ago.   Denies alcohol, caffeine,  and illicit drug use.      ROS: no fevers or chills, productive cough, hemoptysis, dysphasia, odynophagia, melena, hematochezia, dysuria, hematuria, rash, seizure activity, orthopnea, PND, pedal edema, claudication. Remaining systems are negative.  Physical Exam: Well-developed  well-nourished in no acute distress.  Skin is warm and dry.  HEENT is normal.  Neck is supple.  Chest is clear to auscultation with normal expansion.  Cardiovascular exam is regular rate and rhythm. Crisp mechanical valve sound Abdominal exam nontender or distended. No masses palpated. Extremities show no edema. neuro grossly intact

## 2014-04-30 ENCOUNTER — Encounter: Payer: Self-pay | Admitting: Cardiology

## 2014-04-30 ENCOUNTER — Ambulatory Visit (INDEPENDENT_AMBULATORY_CARE_PROVIDER_SITE_OTHER): Payer: Medicare Other | Admitting: Cardiology

## 2014-04-30 VITALS — BP 116/80 | HR 111 | Ht 63.0 in | Wt 157.0 lb

## 2014-04-30 DIAGNOSIS — Z950 Presence of cardiac pacemaker: Secondary | ICD-10-CM | POA: Diagnosis not present

## 2014-04-30 DIAGNOSIS — I5042 Chronic combined systolic (congestive) and diastolic (congestive) heart failure: Secondary | ICD-10-CM | POA: Diagnosis not present

## 2014-04-30 DIAGNOSIS — I482 Chronic atrial fibrillation, unspecified: Secondary | ICD-10-CM

## 2014-04-30 DIAGNOSIS — N184 Chronic kidney disease, stage 4 (severe): Secondary | ICD-10-CM | POA: Diagnosis not present

## 2014-04-30 DIAGNOSIS — I10 Essential (primary) hypertension: Secondary | ICD-10-CM

## 2014-04-30 DIAGNOSIS — Z9889 Other specified postprocedural states: Secondary | ICD-10-CM

## 2014-04-30 DIAGNOSIS — I429 Cardiomyopathy, unspecified: Secondary | ICD-10-CM

## 2014-04-30 NOTE — Assessment & Plan Note (Signed)
Continue beta blocker. I will avoid ACE inhibitors because of renal insufficiency. I will not add hydralazine and nitrates at this point as her blood pressure is borderline.

## 2014-04-30 NOTE — Assessment & Plan Note (Signed)
Blood pressure controlled. Continue present medications. 

## 2014-04-30 NOTE — Assessment & Plan Note (Signed)
Followed by nephrology. 

## 2014-04-30 NOTE — Assessment & Plan Note (Signed)
Continue Toprol and anticoagulation.

## 2014-04-30 NOTE — Assessment & Plan Note (Signed)
Followed by electrophysiology. 

## 2014-04-30 NOTE — Patient Instructions (Signed)
CANCEL HEART FAILURE APPT ON Monday AND RESCHEDULE OUT ONE MONTH WITH DR [facility] AT [place]  Your physician recommends that you schedule a follow-up appointment in: [place]

## 2014-04-30 NOTE — Assessment & Plan Note (Signed)
Continue SBE prophylaxis. 

## 2014-04-30 NOTE — Assessment & Plan Note (Signed)
Patient is symptomatically improved. Continue present dose of Lasix. Her renal function is being followed closely. We are trying to balance keeping her euvolemic versus renal insufficiency.

## 2014-05-04 ENCOUNTER — Encounter (HOSPITAL_COMMUNITY): Payer: Medicare Other

## 2014-05-04 ENCOUNTER — Telehealth: Payer: Self-pay | Admitting: Internal Medicine

## 2014-05-04 NOTE — Telephone Encounter (Signed)
New message     Patient calling stating she been having problems recently . But not at this time - she has an appt on Monday wants to make sure she will have time to discuss her issues.

## 2014-05-04 NOTE — Telephone Encounter (Signed)
Called patient and spoke with her and let her know I would pass the message along

## 2014-05-11 ENCOUNTER — Ambulatory Visit (INDEPENDENT_AMBULATORY_CARE_PROVIDER_SITE_OTHER): Payer: Medicare Other | Admitting: Internal Medicine

## 2014-05-11 ENCOUNTER — Encounter: Payer: Self-pay | Admitting: Internal Medicine

## 2014-05-11 ENCOUNTER — Other Ambulatory Visit (HOSPITAL_BASED_OUTPATIENT_CLINIC_OR_DEPARTMENT_OTHER): Payer: Medicare Other

## 2014-05-11 VITALS — BP 100/80 | HR 89 | Ht 63.0 in | Wt 153.0 lb

## 2014-05-11 DIAGNOSIS — N183 Chronic kidney disease, stage 3 unspecified: Secondary | ICD-10-CM

## 2014-05-11 DIAGNOSIS — R001 Bradycardia, unspecified: Secondary | ICD-10-CM

## 2014-05-11 DIAGNOSIS — D6861 Antiphospholipid syndrome: Secondary | ICD-10-CM

## 2014-05-11 DIAGNOSIS — I429 Cardiomyopathy, unspecified: Secondary | ICD-10-CM | POA: Diagnosis not present

## 2014-05-11 DIAGNOSIS — I482 Chronic atrial fibrillation, unspecified: Secondary | ICD-10-CM

## 2014-05-11 DIAGNOSIS — I495 Sick sinus syndrome: Secondary | ICD-10-CM | POA: Diagnosis not present

## 2014-05-11 DIAGNOSIS — Z9889 Other specified postprocedural states: Secondary | ICD-10-CM

## 2014-05-11 DIAGNOSIS — Z7901 Long term (current) use of anticoagulants: Secondary | ICD-10-CM

## 2014-05-11 DIAGNOSIS — I5042 Chronic combined systolic (congestive) and diastolic (congestive) heart failure: Secondary | ICD-10-CM | POA: Diagnosis not present

## 2014-05-11 DIAGNOSIS — D696 Thrombocytopenia, unspecified: Secondary | ICD-10-CM

## 2014-05-11 DIAGNOSIS — D6859 Other primary thrombophilia: Secondary | ICD-10-CM

## 2014-05-11 LAB — MDC_IDC_ENUM_SESS_TYPE_INCLINIC
Battery Remaining Longevity: 76.8 mo
Battery Voltage: 2.93 V
Brady Statistic RA Percent Paced: 0 %
Brady Statistic RV Percent Paced: 96 %
Implantable Pulse Generator Model: 3210
Implantable Pulse Generator Serial Number: 2786353
Lead Channel Impedance Value: 450 Ohm
Lead Channel Pacing Threshold Amplitude: 0.5 V
Lead Channel Pacing Threshold Amplitude: 0.625 V
Lead Channel Pacing Threshold Amplitude: 0.875 V
Lead Channel Pacing Threshold Pulse Width: 0.5 ms
Lead Channel Sensing Intrinsic Amplitude: 9.7 mV
Lead Channel Setting Pacing Amplitude: 2 V
Lead Channel Setting Pacing Pulse Width: 0.5 ms
MDC IDC MSMT LEADCHNL LV IMPEDANCE VALUE: 1012.5 Ohm
MDC IDC MSMT LEADCHNL LV PACING THRESHOLD PULSEWIDTH: 0.8 ms
MDC IDC MSMT LEADCHNL RA PACING THRESHOLD PULSEWIDTH: 0.5 ms
MDC IDC SESS DTM: 20151116105530
MDC IDC SET LEADCHNL LV PACING PULSEWIDTH: 0.8 ms
MDC IDC SET LEADCHNL RV PACING AMPLITUDE: 2 V
MDC IDC SET LEADCHNL RV SENSING SENSITIVITY: 2 mV

## 2014-05-11 LAB — COMPREHENSIVE METABOLIC PANEL (CC13)
ALBUMIN: 4.1 g/dL (ref 3.5–5.0)
ALT: 43 U/L (ref 0–55)
AST: 36 U/L — AB (ref 5–34)
Alkaline Phosphatase: 101 U/L (ref 40–150)
Anion Gap: 13 mEq/L — ABNORMAL HIGH (ref 3–11)
BUN: 60.9 mg/dL — ABNORMAL HIGH (ref 7.0–26.0)
CHLORIDE: 100 meq/L (ref 98–109)
CO2: 27 mEq/L (ref 22–29)
Calcium: 10.4 mg/dL (ref 8.4–10.4)
Creatinine: 2 mg/dL — ABNORMAL HIGH (ref 0.6–1.1)
Glucose: 96 mg/dl (ref 70–140)
POTASSIUM: 3.9 meq/L (ref 3.5–5.1)
Sodium: 140 mEq/L (ref 136–145)
Total Bilirubin: 0.92 mg/dL (ref 0.20–1.20)
Total Protein: 7.8 g/dL (ref 6.4–8.3)

## 2014-05-11 LAB — CBC & DIFF AND RETIC
BASO%: 1.5 % (ref 0.0–2.0)
Basophils Absolute: 0.1 10*3/uL (ref 0.0–0.1)
EOS%: 4.2 % (ref 0.0–7.0)
Eosinophils Absolute: 0.2 10*3/uL (ref 0.0–0.5)
HCT: 37 % (ref 34.8–46.6)
HGB: 11.8 g/dL (ref 11.6–15.9)
Immature Retic Fract: 5.7 % (ref 1.60–10.00)
LYMPH%: 15.7 % (ref 14.0–49.7)
MCH: 29.1 pg (ref 25.1–34.0)
MCHC: 31.9 g/dL (ref 31.5–36.0)
MCV: 91.1 fL (ref 79.5–101.0)
MONO#: 0.4 10*3/uL (ref 0.1–0.9)
MONO%: 6.6 % (ref 0.0–14.0)
NEUT%: 72 % (ref 38.4–76.8)
NEUTROS ABS: 3.8 10*3/uL (ref 1.5–6.5)
Platelets: 78 10*3/uL — ABNORMAL LOW (ref 145–400)
RBC: 4.06 10*6/uL (ref 3.70–5.45)
RDW: 13.6 % (ref 11.2–14.5)
RETIC %: 0.73 % (ref 0.70–2.10)
RETIC CT ABS: 29.64 10*3/uL — AB (ref 33.70–90.70)
WBC: 5.3 10*3/uL (ref 3.9–10.3)
lymph#: 0.8 10*3/uL — ABNORMAL LOW (ref 0.9–3.3)

## 2014-05-11 LAB — HAPTOGLOBIN: Haptoglobin: <25 mg/dL — ABNORMAL LOW (ref 45–215)

## 2014-05-11 LAB — LACTATE DEHYDROGENASE (CC13): LDH: 354 U/L — ABNORMAL HIGH (ref 125–245)

## 2014-05-11 NOTE — Patient Instructions (Signed)
Your physician wants you to follow-up in: 6 months with Dr. Vallery Ridge will receive a reminder letter in the mail two months in advance. If you don't receive a letter, please call our office to schedule the follow-up appointment.   Remote monitoring is used to monitor your Pacemaker or ICD from home. This monitoring reduces the number of office visits required to check your device to one time per year. It allows [place] to keep an eye on the functioning of your device to ensure it is working properly. You are scheduled for a device check from home on 08/12/13. You may send your transmission at any time that day. If you have a wireless device, the transmission will be sent automatically. After your physician reviews your transmission, you will receive a postcard with your next transmission date.

## 2014-05-11 NOTE — Progress Notes (Signed)
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 female who presents today for routine electrophysiology followup.  Since last being seen in our clinic, the patient reports doing reasonably well. She developed fluid overload several weeks ago but has since increased her lasix and is now back to baseline. Today, she denies symptoms of palpitations, chest pain, shortness of breath,  lower extremity edema, dizziness, presyncope, or syncope.  The patient is otherwise without complaint today.   Past Medical History  Diagnosis Date  . Rheumatic heart disease 1995    post percutaneous valvuloplasty at [facility] with subsequent mechanical mitral valve replacement and tricuspid annuloplasty at [facility]  . Subdural hematoma 2009    on coumadin  . Heparin-induced thrombocytopenia   . HTN (hypertension)   . History of Hashimoto thyroiditis   . HLD (hyperlipidemia)   . Atrial flutter     atypical right atrial flutter s/p ablation at [facility] by Dr Clyda Hurdle  . 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 Pacemaker   . Antiphospholipid antibody syndrome   . Anemia   . Heart murmur   . CHF (congestive heart failure)   . Hypothyroidism   . History of blood transfusion     "lots; most in 2007 w/MVR OR"  . Cerebrovascular accident ?1990; 1993; 2007    residual "can't use my right hand; left visual field cut" (03/28/2012)  . Arthritis     "may have some" (03/28/2012)  . Chronic kidney disease     "as a result of my heart failing" (03/28/2012)  . Fine motor impairment     "fingers right hand" (03/28/2012)  . Right leg swelling 10/06/2013    Asymmetric; no calf pain, venous doppler negative for DVT 10/06/13   Past Surgical History  Procedure Laterality Date  . Mitral valve replacement  2007    with tricuspid annuloplasty  . Amputation      left atrial appendage amputated with MVR  . Craniotomy  2008    for SDH  . Cholecystectomy    .  Tonsillectomy    . Percutaneous balloon valvuloplasty  1995    At [facility] with subsequent mechanical mitral valve replacement and tricuspid annuloplasty at [facility]  . Atrial ablation surgery      CTI and R atriostomy scar flutter ablation at [facility] 8/12,  repeat  ablation at [facility]  9/12  . Cardiac valve replacement    . Tonsillectomy      "when I was a child"  . Total abdominal hysterectomy  ?1995    TAH; BSO  . Biventricular pacemaker placement  03/28/2012    SJM Anthem implanted by Dr Lovena Le  . Cardioversion  2010-2011    "3 at [facility]" (03/28/2012)  . Cardiac electrophysiology mapping and ablation  2012    "2 @ [facility]" (03/28/2012    Current Outpatient Prescriptions  Medication Sig Dispense Refill  . Calcium-Magnesium (CAL-MAG PO) Take 1 tablet by mouth daily.     . Cholecalciferol (VITAMIN D) 2000 UNITS tablet Take 2,000 Units by mouth daily.      . Estradiol Acetate (FEMRING) 0.05 MG/24HR RING Place 1 each vaginally as directed.     . fish oil-omega-3 fatty acids 1000 MG capsule Take 1 g by mouth daily.     . fondaparinux (ARIXTRA) 5 MG/0.4ML SOLN injection Inject 0.4 mLs (5 mg total) into the skin daily. Use as directed 1.6 mL 11  .  furosemide (LASIX) 40 MG tablet Take 1 tablet (40 mg total) by mouth daily. 30 tablet 3  . levothyroxine (SYNTHROID, LEVOTHROID) 88 MCG tablet Take 88 mcg by mouth daily before breakfast.    . metoprolol succinate (TOPROL-XL) 50 MG 24 hr tablet Take 1 tablet (50 mg total) by mouth 2 (two) times daily. 60 tablet 3  . acetaminophen (TYLENOL) 650 MG CR tablet Take 650 mg by mouth daily as needed for pain.     Marland Kitchen AMOXICILLIN PO Take 4 capsules by mouth as directed. ONLY WHEN VISITING DENTIST    . valACYclovir (VALTREX) 1000 MG tablet Take 1,000 mg by mouth 2 (two) times daily. For a total of 3 doses for labial herpes prn only     Current Facility-Administered Medications  Medication Dose Route Frequency Provider Last Rate Last Dose  . botulinum toxin Type  A (BOTOX) injection 100 Units  100 Units Intramuscular Once Hulen Luster, DO      . botulinum toxin Type A (BOTOX) injection 25 Units  25 Units Intramuscular Once Hulen Luster, DO      . botulinum toxin Type A (BOTOX) injection 40 Units  40 Units Intramuscular Once Hulen Luster, DO        Physical Exam: Filed Vitals:   05/11/14 0934  BP: 100/80  Pulse: 89  Height: 5\' 3"  (1.6 m)  Weight: 153 lb (69.4 kg)    GEN- The patient is well appearing, alert and oriented x 3 today.   Head- normocephalic, atraumatic Eyes-  Sclera clear, conjunctiva pink Ears- hearing intact Oropharynx- clear Lungs- Clear to ausculation bilaterally, normal work of breathing Chest- pacemaker pocket is well healed Heart- Regular rate and rhythm (paced), mechanical S1 is crisp GI- soft, NT, ND, + BS Extremities- no clubbing, cyanosis, or edema  Pacemaker interrogation- reviewed in detail today,  See PACEART report  Assessment and Plan:  1. Tach/brady Normal BiV pacemaker function with 96% Biv pacing See Pace Art report No changes today  2. afib Continue long term rate control and anticoagulation. Given valvular disease and severe LA enlargement, she is not likely to achieve/ maintain sinus.  Per study coordinator, she is not a candidate for Genetic AF. As her heart rates are controlled, there is presently no indication for AV nodal ablation (she is 96% BiV paced)  3. Valvular heart disease Stable No change required today No changes at this time  Merlin Return in 1 year

## 2014-05-13 DIAGNOSIS — N2581 Secondary hyperparathyroidism of renal origin: Secondary | ICD-10-CM | POA: Diagnosis not present

## 2014-05-13 DIAGNOSIS — I4891 Unspecified atrial fibrillation: Secondary | ICD-10-CM | POA: Diagnosis not present

## 2014-05-13 DIAGNOSIS — N189 Chronic kidney disease, unspecified: Secondary | ICD-10-CM | POA: Diagnosis not present

## 2014-05-13 DIAGNOSIS — I10 Essential (primary) hypertension: Secondary | ICD-10-CM | POA: Diagnosis not present

## 2014-05-13 DIAGNOSIS — I519 Heart disease, unspecified: Secondary | ICD-10-CM | POA: Diagnosis not present

## 2014-05-13 DIAGNOSIS — E039 Hypothyroidism, unspecified: Secondary | ICD-10-CM | POA: Diagnosis not present

## 2014-05-13 DIAGNOSIS — D631 Anemia in chronic kidney disease: Secondary | ICD-10-CM | POA: Diagnosis not present

## 2014-05-13 DIAGNOSIS — I4892 Unspecified atrial flutter: Secondary | ICD-10-CM | POA: Diagnosis not present

## 2014-05-19 DIAGNOSIS — Z1389 Encounter for screening for other disorder: Secondary | ICD-10-CM | POA: Diagnosis not present

## 2014-05-19 DIAGNOSIS — E782 Mixed hyperlipidemia: Secondary | ICD-10-CM | POA: Diagnosis not present

## 2014-05-19 DIAGNOSIS — I10 Essential (primary) hypertension: Secondary | ICD-10-CM | POA: Diagnosis not present

## 2014-05-19 DIAGNOSIS — Z800 Family history of malignant neoplasm of digestive organs: Secondary | ICD-10-CM | POA: Diagnosis not present

## 2014-05-19 DIAGNOSIS — Z0001 Encounter for general adult medical examination with abnormal findings: Secondary | ICD-10-CM | POA: Diagnosis not present

## 2014-05-19 DIAGNOSIS — Z952 Presence of prosthetic heart valve: Secondary | ICD-10-CM | POA: Diagnosis not present

## 2014-05-19 DIAGNOSIS — I080 Rheumatic disorders of both mitral and aortic valves: Secondary | ICD-10-CM | POA: Diagnosis not present

## 2014-05-19 DIAGNOSIS — J310 Chronic rhinitis: Secondary | ICD-10-CM | POA: Diagnosis not present

## 2014-05-25 DIAGNOSIS — Z124 Encounter for screening for malignant neoplasm of cervix: Secondary | ICD-10-CM | POA: Diagnosis not present

## 2014-06-02 ENCOUNTER — Ambulatory Visit (HOSPITAL_COMMUNITY)
Admission: RE | Admit: 2014-06-02 | Discharge: 2014-06-02 | Disposition: A | Discharge status: Home / Self Care | Payer: Medicare Other | Source: Ambulatory Visit | Attending: Internal Medicine | Admitting: Internal Medicine

## 2014-06-02 ENCOUNTER — Encounter (HOSPITAL_COMMUNITY): Payer: Self-pay

## 2014-06-02 VITALS — BP 98/70 | HR 80 | Wt 156.1 lb

## 2014-06-02 DIAGNOSIS — E785 Hyperlipidemia, unspecified: Secondary | ICD-10-CM | POA: Diagnosis not present

## 2014-06-02 DIAGNOSIS — Z79899 Other long term (current) drug therapy: Secondary | ICD-10-CM | POA: Diagnosis not present

## 2014-06-02 DIAGNOSIS — R42 Dizziness and giddiness: Secondary | ICD-10-CM

## 2014-06-02 DIAGNOSIS — N189 Chronic kidney disease, unspecified: Secondary | ICD-10-CM | POA: Insufficient documentation

## 2014-06-02 DIAGNOSIS — Z9889 Other specified postprocedural states: Secondary | ICD-10-CM

## 2014-06-02 DIAGNOSIS — R0602 Shortness of breath: Secondary | ICD-10-CM | POA: Diagnosis not present

## 2014-06-02 DIAGNOSIS — R609 Edema, unspecified: Secondary | ICD-10-CM

## 2014-06-02 DIAGNOSIS — I429 Cardiomyopathy, unspecified: Secondary | ICD-10-CM

## 2014-06-02 DIAGNOSIS — I5022 Chronic systolic (congestive) heart failure: Secondary | ICD-10-CM | POA: Diagnosis not present

## 2014-06-02 DIAGNOSIS — E039 Hypothyroidism, unspecified: Secondary | ICD-10-CM | POA: Diagnosis not present

## 2014-06-02 DIAGNOSIS — N183 Chronic kidney disease, stage 3 unspecified: Secondary | ICD-10-CM

## 2014-06-02 DIAGNOSIS — I272 Pulmonary hypertension, unspecified: Secondary | ICD-10-CM

## 2014-06-02 DIAGNOSIS — I270 Primary pulmonary hypertension: Secondary | ICD-10-CM | POA: Diagnosis not present

## 2014-06-02 DIAGNOSIS — E063 Autoimmune thyroiditis: Secondary | ICD-10-CM | POA: Insufficient documentation

## 2014-06-02 DIAGNOSIS — Z8673 Personal history of transient ischemic attack (TIA), and cerebral infarction without residual deficits: Secondary | ICD-10-CM | POA: Insufficient documentation

## 2014-06-02 DIAGNOSIS — I099 Rheumatic heart disease, unspecified: Secondary | ICD-10-CM | POA: Insufficient documentation

## 2014-06-02 DIAGNOSIS — I482 Chronic atrial fibrillation: Secondary | ICD-10-CM | POA: Diagnosis not present

## 2014-06-02 DIAGNOSIS — Z87891 Personal history of nicotine dependence: Secondary | ICD-10-CM | POA: Insufficient documentation

## 2014-06-02 DIAGNOSIS — Z952 Presence of prosthetic heart valve: Secondary | ICD-10-CM | POA: Insufficient documentation

## 2014-06-02 DIAGNOSIS — I5042 Chronic combined systolic (congestive) and diastolic (congestive) heart failure: Secondary | ICD-10-CM | POA: Diagnosis not present

## 2014-06-02 LAB — BASIC METABOLIC PANEL
Anion gap: 16 — ABNORMAL HIGH (ref 5–15)
BUN: 62 mg/dL — AB (ref 6–23)
CHLORIDE: 97 meq/L (ref 96–112)
CO2: 25 mEq/L (ref 19–32)
Calcium: 9.4 mg/dL (ref 8.4–10.5)
Creatinine, Ser: 1.92 mg/dL — ABNORMAL HIGH (ref 0.50–1.10)
GFR calc non Af Amer: 26 mL/min — ABNORMAL LOW (ref >90)
GFR, EST AFRICAN AMERICAN: 30 mL/min — AB (ref >90)
GLUCOSE: 92 mg/dL (ref 70–99)
POTASSIUM: 4.4 meq/L (ref 3.7–5.3)
Sodium: 138 mEq/L (ref 137–147)

## 2014-06-02 NOTE — Addendum Note (Signed)
Encounter addended by: Larey Dresser, MD on: 06/02/2014 11:40 PM<BR>     Documentation filed: Notes Section

## 2014-06-02 NOTE — Progress Notes (Addendum)
Patient ID: [name], female   DOB: 1946/05/11, 68 y.o.   MRN: [id number] PCP: Dr. Inda Merlin Primary Cardiologist: Dr. Stanford Breed Hematologist: Dr. Beryle Beams  68 yo with extensive past history including mechanical MV and TV repair, chronic systolic CHF with EF 24-26%, chronic atrial fibrillation, prior CVA, antiphospholipid antibody syndrome, subdural hematomas x 2, and CKD presents for CHF clinic evaluation.  Patient had initial valvuloplasty at [facility] in 1995.  She later had mechanical MV replacement and TV repair complicated by CVA with left hemiparesis.  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  She is anticoagulated now with fondaparinux as she had subdural hematomas x 2 while on coumadin.  She is not on ASA 81 with low platelets. She has permanent atrial fibrillation (has failed cardioversion, cannot get amiodarone as she had torsades in the past with amiodarone use).    Until about 5/15, she was doing relatively well.  She was working out with a Clinical research associate several days a week at the gym with no significant problems. Within a 2 week period in 5/15, she developed profound exertional dyspnea.  She does not know any particular trigger for this.  Echo in 6/15 showed a fall in EF to 35-40%.  Valves looked ok.  No chest pain.  She felt like her dyspnea was similar to prior to placement of BiV pacemaker.  However, she was was 96% BiV paced in 8/15 despite her chronic atrial fibrillation.  She was started on Lasix and it was titrated to 40 mg daily.  However, she became hypotensive with AKI and Lasix was stopped.  It was restarted at 20 mg daily in 9/15.    At a prior appointment, diltiazem was stopped and she was transitioned to Toprol XL.  Lasix was increased to 40 mg daily.  She had an adenosine Cardiolite in 10/15 that showed EF 35% with fixed defect, no ischemia.  This was thought to be scar versus pacing artifact. She continues to do well symptomatically.  She can walk up a hill or up stairs without much  problem.  She is exercising regularly with a trainer.  No chest pain.  Stable creatinine. She was seen by Thompson Grayer in 11/15 and is BiV pacing 96% of times.   She is off Crestor due to generalized fatigue.  This is improved, but not sure that it was related to stopping the Crestor.    Labs (9/15): K 4.5, creatinine 1.98, HCT 30.8, plts 66 Labs (10/15): K 4.2, BUN 69, creatinine 1.89 (Dr Sanda Klein office) Labs (11/15): K 3.9, creatinine 2.0, HCT 37  PMH: 1. Rheumatic heart disease: Mitral valvuloplasty 1995 at [facility].  Later had mechanical MV replacement and tricuspid annuloplasty at [facility].   2. CVA: Embolic, at time of mitral valve surgery.  Has left hemiparesis.  3. H/o HIT 4. 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  5. Hypothyroidism: Hashimoto's thyroiditis.  6. Atypical atrial flutter: s/p ablation in 8/12 and repeat in 9/12 [facility]).  7. Chronic atrial fibrillation 8. Torsades de pointes with amiodarone use.  9. Antiphospholipid antibody syndrome: Anticoagulated with fondaparinus.  10. CKD 11. Chronic systolic CHF: [employer] CRT-D device.  Echo (4/15) with EF 45-50%.  Echo (6/15) with EF 35-40%, diffuse hypokinesis, normal mechanical mitral valve with mild MR, tricuspid valve s/p repair.  - Adenosine Cardiolite (10/15) with EF 35%, fixed apical anterior, apical septal, and apical defect with no ischemia.  Scar versus pacing artifact.   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   FH: No premature CAD  ROS: All systems reviewed and negative except as per HPI.   Current Outpatient Prescriptions  Medication Sig Dispense Refill  . acetaminophen (TYLENOL) 650 MG CR tablet Take 650 mg by mouth daily as needed for pain.     Marland Kitchen AMOXICILLIN PO Take 4 capsules by mouth as directed. ONLY WHEN VISITING DENTIST    . Calcium-Magnesium (CAL-MAG PO) Take 1 tablet by mouth daily.     . Cholecalciferol (VITAMIN D) 2000 UNITS tablet Take 2,000  Units by mouth daily.      Marland Kitchen co-enzyme Q-10 30 MG capsule Take 100 mg by mouth daily.    . Estradiol Acetate (FEMRING) 0.05 MG/24HR RING Place 1 each vaginally as directed.     . fondaparinux (ARIXTRA) 5 MG/0.4ML SOLN injection Inject 0.4 mLs (5 mg total) into the skin daily. Use as directed 1.6 mL 11  . furosemide (LASIX) 40 MG tablet Take 1 tablet (40 mg total) by mouth daily. 30 tablet 3  . levothyroxine (SYNTHROID, LEVOTHROID) 88 MCG tablet Take 88 mcg by mouth daily before breakfast.    . Misc Natural Products (TART CHERRY ADVANCED PO) Take 1,200 mg by mouth 2 (two) times daily.    . valACYclovir (VALTREX) 1000 MG tablet Take 1,000 mg by mouth 2 (two) times daily. For a total of 3 doses for labial herpes prn only    . fish oil-omega-3 fatty acids 1000 MG capsule Take 1 g by mouth daily.     . metoprolol succinate (TOPROL-XL) 50 MG 24 hr tablet Take 1 tablet (50 mg total) by mouth 2 (two) times daily. (Patient taking differently: 50 mg 2 (two) times daily. ) 60 tablet 3   Current Facility-Administered Medications  Medication Dose Route Frequency Provider Last Rate Last Dose  . botulinum toxin Type A (BOTOX) injection 100 Units  100 Units Intramuscular Once Hulen Luster, DO      . botulinum toxin Type A (BOTOX) injection 25 Units  25 Units Intramuscular Once Hulen Luster, DO      . botulinum toxin Type A (BOTOX) injection 40 Units  40 Units Intramuscular Once [employer], DO        BP 98/70 mmHg  Pulse 80  Wt 156 lb 1.9 oz (70.816 kg)  SpO2 99% General: NAD Neck: JVP 7 cm, no thyromegaly or thyroid nodule.  Lungs: Clear to auscultation bilaterally with normal respiratory effort. CV: Nondisplaced PMI.  Heart regular S1/S2, mechanical S1, no S3/S4, 1/6 SEM RUSB.  No edema.  No carotid bruit.  Normal pedal pulses.  Abdomen: Soft, nontender, no hepatosplenomegaly, no distention.  Skin: Intact without lesions or rashes.  Neurologic: Alert and oriented x 3.  Psych:  Normal affect. Extremities: No clubbing or cyanosis.   Assessment/Plan:  1. Rheumatic heart disease: S/p MV replacement (mechanical) and TV repair.  The valves looked ok on echo in 6/15 without evidence for malfunction.  She is anticoagulated with fondaparinux chronically given subdural hematomas x 2 on coumadin.  She is not on aspirin given low platelets.  Repeat echo in 6/16.  2. Atrial fibrillation: Permanent.  Patient has failed DCCV and amiodarone caused torsades.  PCM check recently showed 96% BiV pacing. As above, she is on fondaprinux.  Rate is in the 100s today.  Continue Toprol XL.  3. CKD: Last BMET stable compared to past.  I will repeat BMET today.  4. Chronic systolic CHF: EF 44-31% on 6/15 echo.  This is decreased from the past.  She developed worsened dyspnea over a period of about 2 weeks for no clear reason.  She has a Research officer, political party CRT-D device.  Adenosine Cardiolite showed EF 35% with a fixed apical defect that may be scar or pacing artifact, no ischemia. Weight is down and volume status is better on exam. Symptomatically, she is doing much better with resolution of most of the dyspnea.   - I will continue the current Lasix for now but get BMET as above.  - Continue Toprol XL 50 mg bid, no BP room for titration.  - No ACEI/spironolactone with CKD.  - Follow low sodium diet.  - Will hold off on RHC for now given improvement.  5. APLAS: Per Dr. Beryle Beams.  6. Hyperlipidemia: PCP to check lipids in 1/16, will decide on re-trial of Crestor at that time.   Followup in 3 months  Loralie Champagne 06/02/2014

## 2014-06-02 NOTE — Patient Instructions (Signed)
Labs today Please plan to return for echocardiogram in June Follow-up in 3 months with the Advanced heart Failure clinic

## 2014-06-04 ENCOUNTER — Encounter (HOSPITAL_COMMUNITY): Payer: Self-pay | Admitting: Internal Medicine

## 2014-06-29 ENCOUNTER — Telehealth: Payer: Self-pay | Admitting: Cardiology

## 2014-06-29 NOTE — Telephone Encounter (Signed)
Received records from [facility] (Dr Jamal Maes) for appointment with Dr Stanford Breed on 08/03/14.  Records given to [facility] (medical records) for Dr Jacalyn Lefevre schedule on 08/03/14.  lp

## 2014-07-01 DIAGNOSIS — E039 Hypothyroidism, unspecified: Secondary | ICD-10-CM | POA: Diagnosis not present

## 2014-07-01 DIAGNOSIS — N2581 Secondary hyperparathyroidism of renal origin: Secondary | ICD-10-CM | POA: Diagnosis not present

## 2014-07-01 DIAGNOSIS — D631 Anemia in chronic kidney disease: Secondary | ICD-10-CM | POA: Diagnosis not present

## 2014-07-01 DIAGNOSIS — N189 Chronic kidney disease, unspecified: Secondary | ICD-10-CM | POA: Diagnosis not present

## 2014-07-03 ENCOUNTER — Telehealth: Payer: Self-pay | Admitting: Cardiology

## 2014-07-03 NOTE — Telephone Encounter (Signed)
Received records from [facility] for appointment on 08/03/14 with Dr Stanford Breed.  Records given to [facility] (medical records) for Dr Jacalyn Lefevre schedule on 08/03/14.  lp

## 2014-07-09 ENCOUNTER — Encounter (HOSPITAL_COMMUNITY): Payer: Self-pay | Admitting: Internal Medicine

## 2014-07-24 DIAGNOSIS — I10 Essential (primary) hypertension: Secondary | ICD-10-CM | POA: Diagnosis not present

## 2014-07-24 DIAGNOSIS — E782 Mixed hyperlipidemia: Secondary | ICD-10-CM | POA: Diagnosis not present

## 2014-07-24 DIAGNOSIS — E039 Hypothyroidism, unspecified: Secondary | ICD-10-CM | POA: Diagnosis not present

## 2014-08-02 NOTE — Progress Notes (Signed)
HPI: FU rheumatic heart disease, atrial tachycardia/flutter, diastolic congestive heart failure. The patient had mitral valvuloplasty at [facility] in the late 90s. In 2007 she had a mechanical mitral valve replacement at [facility]. 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 She's had a prior subdural hematoma that required evacuation. She also has a history of heparin-induced thrombocytopenia. She is followed by Dr Rayann Heman for SVT and is s/p ablation at [facility]; also with h/o torsades with amiodarone. Has had previous biventricular pacemaker. Last echocardiogram in June 2015 showed worse EF 35-40, trace AI, MVR and biatrial enlargement. Nuclear study October 2015 showed an ejection fraction of 35%. There was a fixed anterior/apical defect consistent with scar. No ischemia. Patient is very volume sensitive. Since last seen, she denies dyspnea, chest pain, edema.  Current Outpatient Prescriptions  Medication Sig Dispense Refill  . acetaminophen (TYLENOL) 650 MG CR tablet Take 650 mg by mouth daily as needed for pain.     Marland Kitchen AMOXICILLIN PO Take 4 capsules by mouth as directed. ONLY WHEN VISITING DENTIST    . Calcium-Magnesium (CAL-MAG PO) Take 1 tablet by mouth daily.     . Cholecalciferol (VITAMIN D) 2000 UNITS tablet Take 2,000 Units by mouth daily.      Marland Kitchen co-enzyme Q-10 30 MG capsule Take 100 mg by mouth daily.    . Estradiol Acetate (FEMRING) 0.05 MG/24HR RING Place 1 each vaginally as directed.     . fondaparinux (ARIXTRA) 5 MG/0.4ML SOLN injection Inject 0.4 mLs (5 mg total) into the skin daily. Use as directed 1.6 mL 11  . furosemide (LASIX) 40 MG tablet Take 1 tablet (40 mg total) by mouth daily. 30 tablet 3  . levothyroxine (SYNTHROID, LEVOTHROID) 88 MCG tablet Take 88 mcg by mouth daily before breakfast.    . metoprolol succinate (TOPROL-XL) 50 MG 24 hr tablet Take 1 tablet (50 mg  total) by mouth 2 (two) times daily. (Patient taking differently: 50 mg 2 (two) times daily. ) 60 tablet 3  . Misc Natural Products (TART CHERRY ADVANCED PO) Take 1,200 mg by mouth 2 (two) times daily.    . valACYclovir (VALTREX) 1000 MG tablet Take 1,000 mg by mouth 2 (two) times daily. For a total of 3 doses for labial herpes prn only     Current Facility-Administered Medications  Medication Dose Route Frequency Provider Last Rate Last Dose  . botulinum toxin Type A (BOTOX) injection 100 Units  100 Units Intramuscular Once Hulen Luster, DO      . botulinum toxin Type A (BOTOX) injection 25 Units  25 Units Intramuscular Once Hulen Luster, DO      . botulinum toxin Type A (BOTOX) injection 40 Units  40 Units Intramuscular Once Hulen Luster, DO         Past Medical History  Diagnosis Date  . Rheumatic heart disease 1995    post percutaneous valvuloplasty at [facility] with subsequent mechanical mitral valve replacement and tricuspid annuloplasty at [facility]  . Subdural hematoma 2009    on coumadin  . Heparin-induced thrombocytopenia   . HTN (hypertension)   . History of Hashimoto thyroiditis   . HLD (hyperlipidemia)   . Atrial flutter     atypical right atrial flutter s/p ablation at [facility] by Dr Clyda Hurdle  . 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 Pacemaker   .  Antiphospholipid antibody syndrome   . Anemia   . Heart murmur   . CHF (congestive heart failure)   . Hypothyroidism   . History of blood transfusion     "lots; most in 2007 w/MVR OR"  . Cerebrovascular accident ?1990; 1993; 2007    residual "can't use my right hand; left visual field cut" (03/28/2012)  . Arthritis     "may have some" (03/28/2012)  . Chronic kidney disease     "as a result of my heart failing" (03/28/2012)  . Fine motor impairment     "fingers right hand" (03/28/2012)  . Right leg swelling 10/06/2013    Asymmetric; no calf pain, venous doppler negative for DVT  10/06/13    Past Surgical History  Procedure Laterality Date  . Mitral valve replacement  2007    with tricuspid annuloplasty  . Amputation      left atrial appendage amputated with MVR  . Craniotomy  2008    for SDH  . Cholecystectomy    . Tonsillectomy    . Percutaneous balloon valvuloplasty  1995    At [facility] with subsequent mechanical mitral valve replacement and tricuspid annuloplasty at [facility]  . Atrial ablation surgery      CTI and R atriostomy scar flutter ablation at [facility] 8/12,  repeat  ablation at [facility]  9/12  . Cardiac valve replacement    . Tonsillectomy      "when I was a child"  . Total abdominal hysterectomy  ?1995    TAH; BSO  . Biventricular pacemaker placement  03/28/2012    SJM Anthem implanted by Dr Lovena Le  . Cardioversion  2010-2011    "3 at [facility]" (03/28/2012)  . Cardiac electrophysiology mapping and ablation  2012    "2 @ [facility]" (03/28/2012  . Bi-ventricular pacemaker insertion N/A 03/28/2012    Procedure: BI-VENTRICULAR PACEMAKER INSERTION (CRT-P);  Surgeon: Evans Lance, MD;  Location: [facility] CATH LAB;  Service: Cardiovascular;  Laterality: N/A;    History   Social History  . Marital Status: Married    Spouse Name: Alease Medina     Number of Children: 0  . Years of Education: college   Occupational History  . PHYSICIAN     retired   Social History Main Topics  . Smoking status: Former Smoker -- 0.10 packs/day for 6 years    Types: Cigarettes  . Smokeless tobacco: Never Used     Comment: 03/28/2012 "smoked in the 1970's"  . Alcohol Use: 0.0 oz/week     Comment: wine rarely.  . Drug Use: No  . Sexual Activity: Not on file   Other Topics Concern  . 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  Quit smoking 25 years ago and quit alcohol 2 years ago.   Denies alcohol, caffeine,  and illicit drug use.      ROS: no fevers or chills, productive cough, hemoptysis,  dysphasia, odynophagia, melena, hematochezia, dysuria, hematuria, rash, seizure activity, orthopnea, PND, pedal edema, claudication. Remaining systems are negative.  Physical Exam: Well-developed well-nourished in no acute distress.  Skin is warm and dry.  HEENT is normal.  Neck is supple.  Chest is clear to auscultation with normal expansion.  Cardiovascular exam is regular rate and rhythm. Crisp mechanical valve sound Abdominal exam nontender or distended. No masses palpated. Extremities show no edema. neuro grossly intact  ECG ventricular paced rhythm with  probable underlying atrial fibrillation.

## 2014-08-03 ENCOUNTER — Encounter: Payer: Self-pay | Admitting: Cardiology

## 2014-08-03 ENCOUNTER — Ambulatory Visit (INDEPENDENT_AMBULATORY_CARE_PROVIDER_SITE_OTHER): Payer: Medicare Other | Admitting: Cardiology

## 2014-08-03 ENCOUNTER — Other Ambulatory Visit: Payer: Self-pay

## 2014-08-03 VITALS — BP 110/72 | HR 86 | Ht 63.0 in | Wt 159.0 lb

## 2014-08-03 DIAGNOSIS — I429 Cardiomyopathy, unspecified: Secondary | ICD-10-CM

## 2014-08-03 DIAGNOSIS — Z950 Presence of cardiac pacemaker: Secondary | ICD-10-CM | POA: Diagnosis not present

## 2014-08-03 DIAGNOSIS — I5042 Chronic combined systolic (congestive) and diastolic (congestive) heart failure: Secondary | ICD-10-CM

## 2014-08-03 DIAGNOSIS — D6861 Antiphospholipid syndrome: Secondary | ICD-10-CM

## 2014-08-03 DIAGNOSIS — I4892 Unspecified atrial flutter: Secondary | ICD-10-CM

## 2014-08-03 DIAGNOSIS — Z9889 Other specified postprocedural states: Secondary | ICD-10-CM | POA: Diagnosis not present

## 2014-08-03 DIAGNOSIS — I10 Essential (primary) hypertension: Secondary | ICD-10-CM

## 2014-08-03 DIAGNOSIS — Z1231 Encounter for screening mammogram for malignant neoplasm of breast: Secondary | ICD-10-CM

## 2014-08-03 DIAGNOSIS — I4891 Unspecified atrial fibrillation: Secondary | ICD-10-CM | POA: Diagnosis not present

## 2014-08-03 NOTE — Assessment & Plan Note (Signed)
Followed by electrophysiology. 

## 2014-08-03 NOTE — Assessment & Plan Note (Signed)
Continue present blood pressure medications. 

## 2014-08-03 NOTE — Patient Instructions (Signed)
Your physician recommends that you schedule a follow-up appointment in: 2 MONTHS WITH DR CRENSHAW  

## 2014-08-03 NOTE — Assessment & Plan Note (Signed)
Continue SBE prophylaxis and anticoagulation.

## 2014-08-03 NOTE — Assessment & Plan Note (Signed)
Continue present dose of Lasix. We are trying to balance keeping patient euvolemic versus worsening renal function. He is scheduled for laboratories tomorrow. We will await follow-up BUN, creatinine and potassium.

## 2014-08-03 NOTE — Assessment & Plan Note (Signed)
Continue beta blocker and Coumadin. 

## 2014-08-03 NOTE — Assessment & Plan Note (Signed)
Continue anticoagulation 

## 2014-08-03 NOTE — Assessment & Plan Note (Signed)
Continue beta blocker. No ACE inhibitor given renal insufficiency. I have not added hydralazine/nitrates as she has had problems with hypotension.

## 2014-08-04 ENCOUNTER — Ambulatory Visit (INDEPENDENT_AMBULATORY_CARE_PROVIDER_SITE_OTHER): Payer: Medicare Other | Admitting: Oncology

## 2014-08-04 ENCOUNTER — Encounter: Payer: Self-pay | Admitting: Oncology

## 2014-08-04 VITALS — BP 121/78 | HR 81 | Temp 97.5°F | Ht 63.0 in | Wt 156.9 lb

## 2014-08-04 DIAGNOSIS — D6861 Antiphospholipid syndrome: Secondary | ICD-10-CM | POA: Diagnosis not present

## 2014-08-04 DIAGNOSIS — Z7901 Long term (current) use of anticoagulants: Secondary | ICD-10-CM | POA: Diagnosis not present

## 2014-08-04 DIAGNOSIS — N183 Chronic kidney disease, stage 3 unspecified: Secondary | ICD-10-CM

## 2014-08-04 DIAGNOSIS — D696 Thrombocytopenia, unspecified: Secondary | ICD-10-CM

## 2014-08-04 DIAGNOSIS — D6859 Other primary thrombophilia: Secondary | ICD-10-CM | POA: Diagnosis not present

## 2014-08-04 DIAGNOSIS — D599 Acquired hemolytic anemia, unspecified: Secondary | ICD-10-CM | POA: Diagnosis not present

## 2014-08-04 LAB — CBC WITH DIFFERENTIAL/PLATELET
Basophils Absolute: 0.1 K/uL (ref 0.0–0.1)
Basophils Relative: 1 % (ref 0–1)
Eosinophils Absolute: 0.2 K/uL (ref 0.0–0.7)
Eosinophils Relative: 4 % (ref 0–5)
HCT: 35.8 % — ABNORMAL LOW (ref 36.0–46.0)
Hemoglobin: 12 g/dL (ref 12.0–15.0)
Lymphocytes Relative: 18 % (ref 12–46)
Lymphs Abs: 0.9 K/uL (ref 0.7–4.0)
MCH: 29.9 pg (ref 26.0–34.0)
MCHC: 33.5 g/dL (ref 30.0–36.0)
MCV: 89.1 fL (ref 78.0–100.0)
MPV: 11.1 fL (ref 8.6–12.4)
Monocytes Absolute: 0.4 K/uL (ref 0.1–1.0)
Monocytes Relative: 7 % (ref 3–12)
Neutro Abs: 3.5 K/uL (ref 1.7–7.7)
Neutrophils Relative %: 70 % (ref 43–77)
Platelets: 97 K/uL — ABNORMAL LOW (ref 150–400)
RBC: 4.02 MIL/uL (ref 3.87–5.11)
RDW: 13.9 % (ref 11.5–15.5)
WBC: 5 K/uL (ref 4.0–10.5)

## 2014-08-04 LAB — COMPREHENSIVE METABOLIC PANEL
ALBUMIN: 4 g/dL (ref 3.5–5.2)
ALT: 34 U/L (ref 0–35)
AST: 31 U/L (ref 0–37)
Alkaline Phosphatase: 125 U/L — ABNORMAL HIGH (ref 39–117)
BUN: 58 mg/dL — ABNORMAL HIGH (ref 6–23)
CO2: 30 mEq/L (ref 19–32)
Calcium: 9.2 mg/dL (ref 8.4–10.5)
Chloride: 101 mEq/L (ref 96–112)
Creat: 1.86 mg/dL — ABNORMAL HIGH (ref 0.50–1.10)
Glucose, Bld: 85 mg/dL (ref 70–99)
Potassium: 4.2 mEq/L (ref 3.5–5.3)
Sodium: 139 mEq/L (ref 135–145)
Total Bilirubin: 0.6 mg/dL (ref 0.2–1.2)
Total Protein: 7 g/dL (ref 6.0–8.3)

## 2014-08-04 LAB — RETICULOCYTES
ABS RETIC: 40.2 10*3/uL (ref 19.0–186.0)
RBC.: 4.02 MIL/uL (ref 3.87–5.11)
RETIC CT PCT: 1 % (ref 0.4–2.3)

## 2014-08-04 LAB — HEPARIN ANTI-XA: Heparin LMW: 1 IU/mL

## 2014-08-04 LAB — LACTATE DEHYDROGENASE: LDH: 278 U/L — AB (ref 94–250)

## 2014-08-04 NOTE — Patient Instructions (Signed)
To lab today Schedule every 2  Month CBC,diff, Cmet, low molecular weight heparin level MD visit 4 months

## 2014-08-05 LAB — DIRECT ANTIGLOBULIN TEST (NOT AT ARMC)
DAT (COMPLEMENT): NEGATIVE
DAT IgG: NEGATIVE

## 2014-08-05 NOTE — Progress Notes (Signed)
Patient ID: [name], female   DOB: Nov 02, 1945, 69 y.o.   MRN: [id number] Hematology and Oncology Follow Up Visit  [name] [id number] Sep 19, 1945 69 y.o. 08/05/2014 8:27 AM   Principle Diagnosis: Encounter Diagnoses  Name Primary?  . Chronic kidney disease, stage 3 (moderate) Yes  . Thrombocytopenia   . Antiphospholipid antibody with hypercoagulable state   . Chronic anticoagulation   . 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 She has had recurrent thrombotic events including an episode of amaurosis fugax and a TIA in the past. She has chronic moderate thrombocytopenia. She developed advanced mitral valve disease and had to undergo an emergency mitral valve replacement when she went into acute congestive heart failure with a concomitant DIC picture. She had a mechanical mitral valve replacement. 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 She developed chronic renal insufficiency.  She suffered a subdural hematoma after minor head trauma while on coumadin requiring emergency evacuation. Attempts to go back on Coumadin resulted in a recurrent subdural. She had an anaphylactic reaction to vitamin K in attempt to reverse the Coumadin at time of the second intracranial bleed. 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  She has been maintained on Arixtra injections 2.5 mg every other day despite her chronic renal dysfunction in view of the Coumadin related hemorrhages in the history of heparin-induced thrombocytopenia  .  Despite the mitral valve replacement, she has had refractory atrial arrhythmias. She has required DC cardioversion and a number of attempts at radiofrequency ablation. She had to have a pacemaker placed.  She  is still in atrial fibrillation.   Interim history: She is doing very well at this time. She is doing moderate exercise at the gym. She is not having any dyspnea or chest pain or palpitations. Her main concern is her fluctuating renal function. She is being followed closely by Dr. Jamal Maes. Her baseline creatinine has inched up over time now 1.9-2.0 with estimated GFR 25-30 mL/m. Potassium and bicarbonate remaining in normal range. She is about to embark on a 10th wedding anniversary trip to [place] in May with her husband who also has a heart valve replacement.     Medications: reviewed  Allergies:  Allergies  Allergen Reactions  . Amiodarone Hcl Other (See Comments)    "torsades; v tach"  . 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 Vitamin K Anaphylaxis    IV only allergy  . Iodinated Diagnostic Agents Itching    "over 35 years ago" (03/28/2012)    Review of Systems: See history of present illness. She has developed some areas of purpura on the skin of the dorsum of her hands. Remaining ROS negative:   Physical Exam: Blood pressure 121/78, pulse 81, temperature 97.5 F (36.4 C), temperature source Oral, height 5\' 3"  (1.6 m), weight 156 lb 14.4 oz (71.169 kg), SpO2 100 %. Wt Readings from Last 3 Encounters:  08/04/14 156 lb 14.4 oz (71.169 kg)  08/03/14 159 lb (72.122 kg)  05/11/14 153 lb (69.4 kg)     General appearance: Well-nourished Caucasian woman HENNT: Pharynx no erythema, exudate, mass, or ulcer. No thyromegaly or thyroid nodules Lymph nodes: No cervical, supraclavicular, or axillary lymphadenopathy Breasts: Lungs: Clear to auscultation, resonant to percussion throughout Heart: Regular rhythm, no murmur, no gallop, no rub, no  click, no edema Abdomen: Soft, nontender, normal bowel sounds, no mass, no organomegaly Extremities: No edema, no calf tenderness Musculoskeletal: no joint deformities GU:  Vascular: Carotid pulses 2+, no bruits,  Neurologic: Alert,  oriented, PERRLA, , cranial nerves grossly normal, motor strength 5 over 5, reflexes 1+ symmetric, upper body coordination normal, gait normal, Skin: No rash or ecchymosis  Lab Results: CBC W/Diff    Component Value Date/Time   WBC 5.0 08/04/2014 1150   WBC 5.3 05/11/2014 1323   RBC 4.02 08/04/2014 1150   RBC 4.02 08/04/2014 1150   RBC 4.06 05/11/2014 1323   HGB 12.0 08/04/2014 1150   HGB 11.8 05/11/2014 1323   HCT 35.8* 08/04/2014 1150   HCT 37.0 05/11/2014 1323   PLT 97* 08/04/2014 1150   PLT 78* 05/11/2014 1323   MCV 89.1 08/04/2014 1150   MCV 91.1 05/11/2014 1323   MCH 29.9 08/04/2014 1150   MCH 29.1 05/11/2014 1323   MCHC 33.5 08/04/2014 1150   MCHC 31.9 05/11/2014 1323   RDW 13.9 08/04/2014 1150   RDW 13.6 05/11/2014 1323   LYMPHSABS 0.9 08/04/2014 1150   LYMPHSABS 0.8* 05/11/2014 1323   MONOABS 0.4 08/04/2014 1150   MONOABS 0.4 05/11/2014 1323   EOSABS 0.2 08/04/2014 1150   EOSABS 0.2 05/11/2014 1323   BASOSABS 0.1 08/04/2014 1150   BASOSABS 0.1 05/11/2014 1323     Chemistry      Component Value Date/Time   NA 139 08/04/2014 1150   NA 140 05/11/2014 1324   K 4.2 08/04/2014 1150   K 3.9 05/11/2014 1324   CL 101 08/04/2014 1150   CL 108* 11/04/2012 1158   CO2 30 08/04/2014 1150   CO2 27 05/11/2014 1324   BUN 58* 08/04/2014 1150   BUN 60.9* 05/11/2014 1324   CREATININE 1.86* 08/04/2014 1150   CREATININE 1.92* 06/02/2014 1501   CREATININE 2.0* 05/11/2014 1324      Component Value Date/Time   CALCIUM 9.2 08/04/2014 1150   CALCIUM 10.4 05/11/2014 1324   ALKPHOS 125* 08/04/2014 1150   ALKPHOS 101 05/11/2014 1324   AST 31 08/04/2014 1150   AST 36* 05/11/2014 1324   ALT 34 08/04/2014 1150   ALT 43 05/11/2014 1324   BILITOT 0.6 08/04/2014 1150   BILITOT 0.92 05/11/2014 1324    Lovenox trough level after 5 mg dose of fondaparinux on February 8 is 1.0  Radiological Studies: No results found.  Impression:  #1. Antiphospholipid antibody syndrome status  post multiple thrombotic events  She remains stable on Arixtra anticoagulation.  Plan: Continue long-term anticoagulation.  Target Arixtra level 0.8-1.1. She currently takes a single additional 5 mg dose per week and otherwise will stay on  2.5 mg every other day She was on low-dose aspirin in the past as well and his Coumadin. I'm not quite sure why we stopped the aspirin and I will need to discuss this with Dr. Kathyrn Drown.  #2. Chronic mild thrombocytopenia secondary to #1   #3. Chronic renal insufficiency likely due to perioperative complications at time of mitral valve replacement in 2007.   #4. Mitral valve disease likely secondary to #1. Status post initial valvuloplasty then emergency  mechanical valve replacement 2007   #5. Atrial fibrillation/flutter- now status post pacemaker implantation having failed DC cardioversion and a radiofrequency ablation procedure.   #6. Essential hypertension   #7. Hypothyroid on replacement   CC: Patient Care Team: Josetta Huddle, MD as PCP - General (Internal Medicine) Annia Belt, MD as Consulting Physician (  Oncology) Jamal Maes, MD as Consulting Physician (Nephrology) Lelon Perla, MD as Consulting Physician (Cardiology) Thompson Grayer, MD as Consulting Physician (Cardiology)   Annia Belt, MD 2/10/20168:27 AM

## 2014-08-10 ENCOUNTER — Other Ambulatory Visit (HOSPITAL_COMMUNITY): Payer: Self-pay | Admitting: Cardiology

## 2014-08-12 ENCOUNTER — Ambulatory Visit (INDEPENDENT_AMBULATORY_CARE_PROVIDER_SITE_OTHER): Payer: Medicare Other | Admitting: *Deleted

## 2014-08-12 DIAGNOSIS — I4891 Unspecified atrial fibrillation: Secondary | ICD-10-CM

## 2014-08-12 LAB — MDC_IDC_ENUM_SESS_TYPE_REMOTE
Battery Remaining Longevity: 66 mo
Battery Remaining Percentage: 67 %
Battery Voltage: 2.93 V
Date Time Interrogation Session: 20160217070009
Implantable Pulse Generator Model: 3210
Implantable Pulse Generator Serial Number: 2786353
Lead Channel Impedance Value: 1075 Ohm
Lead Channel Pacing Threshold Amplitude: 0.875 V
Lead Channel Pacing Threshold Amplitude: 0.875 V
Lead Channel Pacing Threshold Pulse Width: 0.8 ms
Lead Channel Setting Pacing Amplitude: 2 V
Lead Channel Setting Pacing Amplitude: 2 V
Lead Channel Setting Pacing Pulse Width: 0.5 ms
Lead Channel Setting Pacing Pulse Width: 0.8 ms
MDC IDC MSMT LEADCHNL RV IMPEDANCE VALUE: 410 Ohm
MDC IDC MSMT LEADCHNL RV PACING THRESHOLD PULSEWIDTH: 0.5 ms
MDC IDC MSMT LEADCHNL RV SENSING INTR AMPL: 9.6 mV
MDC IDC SET LEADCHNL RV SENSING SENSITIVITY: 2 mV

## 2014-08-12 NOTE — Progress Notes (Signed)
Remote pacemaker transmission.   

## 2014-08-19 ENCOUNTER — Encounter: Payer: Self-pay | Admitting: Cardiology

## 2014-08-21 ENCOUNTER — Encounter: Payer: Self-pay | Admitting: Internal Medicine

## 2014-09-02 ENCOUNTER — Encounter: Payer: Self-pay | Admitting: Cardiology

## 2014-09-08 ENCOUNTER — Telehealth: Payer: Self-pay | Admitting: Internal Medicine

## 2014-09-08 NOTE — Telephone Encounter (Signed)
Rescheduled remote for 11-24-14//kwm

## 2014-09-08 NOTE — Telephone Encounter (Signed)
New problem   Pt stated she will be out of the country on 5.18.16 and can you sched her for 5.31.16 she will be back.

## 2014-10-12 ENCOUNTER — Other Ambulatory Visit (HOSPITAL_BASED_OUTPATIENT_CLINIC_OR_DEPARTMENT_OTHER): Payer: Medicare Other

## 2014-10-12 DIAGNOSIS — N183 Chronic kidney disease, stage 3 unspecified: Secondary | ICD-10-CM

## 2014-10-12 DIAGNOSIS — D6862 Lupus anticoagulant syndrome: Secondary | ICD-10-CM | POA: Diagnosis not present

## 2014-10-12 DIAGNOSIS — Z7901 Long term (current) use of anticoagulants: Secondary | ICD-10-CM

## 2014-10-12 DIAGNOSIS — D6861 Antiphospholipid syndrome: Secondary | ICD-10-CM

## 2014-10-12 DIAGNOSIS — D599 Acquired hemolytic anemia, unspecified: Secondary | ICD-10-CM

## 2014-10-12 DIAGNOSIS — D696 Thrombocytopenia, unspecified: Secondary | ICD-10-CM

## 2014-10-12 LAB — COMPREHENSIVE METABOLIC PANEL (CC13)
ALK PHOS: 112 U/L (ref 40–150)
ALT: 22 U/L (ref 0–55)
AST: 30 U/L (ref 5–34)
Albumin: 3.7 g/dL (ref 3.5–5.0)
Anion Gap: 12 mEq/L — ABNORMAL HIGH (ref 3–11)
BUN: 65.3 mg/dL — ABNORMAL HIGH (ref 7.0–26.0)
CO2: 26 mEq/L (ref 22–29)
CREATININE: 2.2 mg/dL — AB (ref 0.6–1.1)
Calcium: 8.9 mg/dL (ref 8.4–10.4)
Chloride: 103 mEq/L (ref 98–109)
EGFR: 22 mL/min/{1.73_m2} — ABNORMAL LOW (ref >90)
Glucose: 89 mg/dl (ref 70–140)
Potassium: 4.7 mEq/L (ref 3.5–5.1)
SODIUM: 140 meq/L (ref 136–145)
TOTAL PROTEIN: 7 g/dL (ref 6.4–8.3)
Total Bilirubin: 0.4 mg/dL (ref 0.20–1.20)

## 2014-10-12 LAB — CBC WITH DIFFERENTIAL/PLATELET
BASO%: 0.7 % (ref 0.0–2.0)
Basophils Absolute: 0 10*3/uL (ref 0.0–0.1)
EOS%: 4.7 % (ref 0.0–7.0)
Eosinophils Absolute: 0.3 10*3/uL (ref 0.0–0.5)
HCT: 33.9 % — ABNORMAL LOW (ref 34.8–46.6)
HGB: 11.2 g/dL — ABNORMAL LOW (ref 11.6–15.9)
LYMPH%: 19.6 % (ref 14.0–49.7)
MCH: 31.2 pg (ref 25.1–34.0)
MCHC: 33 g/dL (ref 31.5–36.0)
MCV: 94.4 fL (ref 79.5–101.0)
MONO#: 0.4 10*3/uL (ref 0.1–0.9)
MONO%: 7.7 % (ref 0.0–14.0)
NEUT#: 3.8 10*3/uL (ref 1.5–6.5)
NEUT%: 67.3 % (ref 38.4–76.8)
Platelets: 74 10*3/uL — ABNORMAL LOW (ref 145–400)
RBC: 3.59 10*6/uL — ABNORMAL LOW (ref 3.70–5.45)
RDW: 13.8 % (ref 11.2–14.5)
WBC: 5.7 10*3/uL (ref 3.9–10.3)
lymph#: 1.1 10*3/uL (ref 0.9–3.3)

## 2014-10-14 ENCOUNTER — Telehealth: Payer: Self-pay | Admitting: *Deleted

## 2014-10-14 NOTE — Telephone Encounter (Signed)
-----   Message from Annia Belt, MD sent at 10/14/2014 10:55 AM EDT ----- Call pt - for some reason arixtra level was not done.  Platelets runniing a little lower than baseline @74 ,000 but within range of prio values.   Hb 11.2. Creatinine. Pleas fax cc of labs to Dr Jamal Maes & R. Mertha Finders - neither one of them are in [facility]!

## 2014-10-14 NOTE — Telephone Encounter (Signed)
Pt called - no answer; message left that Arixtra level was not done, platelets lower than base @ 74,000 but within range of prior values - Hgb 11.2 and BUN/Crea 65.3/2.2 per Dr Beryle Beams. And to call if she has any questions. And I will fax labs to Dr Lorrene Reid and Dr Inda Merlin' offices.

## 2014-10-14 NOTE — Telephone Encounter (Signed)
Labs faxed

## 2014-10-15 ENCOUNTER — Telehealth: Payer: Self-pay | Admitting: *Deleted

## 2014-10-15 NOTE — Telephone Encounter (Signed)
Pt called and informed her level has been stable and can wait until she returns from her trip per Dr Beryle Beams. Pt stated ok.

## 2014-10-15 NOTE — Telephone Encounter (Signed)
Call from pt - Stated she did not know why Arixtra level was not done. Her next Arixtra is Friday and can have lab drawn on this day. Otherwise, she will be lleaving for [place] on Tuesday 5/3. Thanks

## 2014-10-15 NOTE — Progress Notes (Signed)
HPI: FU rheumatic heart disease, atrial tachycardia/flutter, diastolic congestive heart failure. The patient had mitral valvuloplasty at [facility] in the late 90s. In 2007 she had a mechanical mitral valve replacement at [facility]. 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 She's had a prior subdural hematoma that required evacuation. She also has a history of heparin-induced thrombocytopenia. She is followed by Dr Rayann Heman for SVT and is s/p ablation at [facility]; also with h/o torsades with amiodarone. Has had previous biventricular pacemaker. Last echocardiogram in June 2015 showed worse EF 35-40, trace AI, MVR and biatrial enlargement. Nuclear study October 2015 showed an ejection fraction of 35%. There was a fixed anterior/apical defect consistent with scar. No ischemia. Patient is very volume sensitive. Since last seen, she denies dyspnea, chest pain, edema.  Current Outpatient Prescriptions  Medication Sig Dispense Refill  . acetaminophen (TYLENOL) 650 MG CR tablet Take 650 mg by mouth daily as needed for pain.     Marland Kitchen AMOXICILLIN PO Take 4 capsules by mouth as directed. ONLY WHEN VISITING DENTIST    . Calcium-Magnesium (CAL-MAG PO) Take 1 tablet by mouth daily.     . Cholecalciferol (VITAMIN D) 2000 UNITS tablet Take 2,000 Units by mouth daily.      Marland Kitchen co-enzyme Q-10 30 MG capsule Take 100 mg by mouth daily.    . Estradiol Acetate (FEMRING) 0.05 MG/24HR RING Place 1 each vaginally as directed.     . fondaparinux (ARIXTRA) 5 MG/0.4ML SOLN injection Inject 0.4 mLs (5 mg total) into the skin daily. Use as directed 1.6 mL 11  . furosemide (LASIX) 20 MG tablet Take 20 mg by mouth every other day. Alternating with Lasix 40 mg    . furosemide (LASIX) 40 MG tablet Take 40 mg by mouth every other day. Alternating with Lasix 20 mg    . levothyroxine (SYNTHROID, LEVOTHROID) 88 MCG tablet Take 88  mcg by mouth daily before breakfast.    . metoprolol succinate (TOPROL-XL) 50 MG 24 hr tablet TAKE ONE (1) TABLET BY MOUTH TWO (2) TIMES DAILY 60 tablet 3  . Misc Natural Products (TART CHERRY ADVANCED PO) Take 2,000 mg by mouth 2 (two) times daily.     . valACYclovir (VALTREX) 1000 MG tablet Take 1,000 mg by mouth 2 (two) times daily. For a total of 3 doses for labial herpes prn only     Current Facility-Administered Medications  Medication Dose Route Frequency Provider Last Rate Last Dose  . botulinum toxin Type A (BOTOX) injection 100 Units  100 Units Intramuscular Once Drema Dallas, DO      . botulinum toxin Type A (BOTOX) injection 25 Units  25 Units Intramuscular Once Drema Dallas, DO      . botulinum toxin Type A (BOTOX) injection 40 Units  40 Units Intramuscular Once Drema Dallas, DO         Past Medical History  Diagnosis Date  . Rheumatic heart disease 1995    post percutaneous valvuloplasty at [facility] with subsequent mechanical mitral valve replacement and tricuspid annuloplasty at [facility]  . Subdural hematoma 2009    on coumadin  . Heparin-induced thrombocytopenia   . HTN (hypertension)   . History of Hashimoto thyroiditis   . HLD (hyperlipidemia)   . Atrial flutter     atypical right atrial flutter s/p ablation at [facility] by Dr Clyda Hurdle  .  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 Pacemaker   . Antiphospholipid antibody syndrome   . Anemia   . Heart murmur   . CHF (congestive heart failure)   . Hypothyroidism   . History of blood transfusion     "lots; most in 2007 w/MVR OR"  . Cerebrovascular accident ?1990; 1993; 2007    residual "can't use my right hand; left visual field cut" (03/28/2012)  . Arthritis     "may have some" (03/28/2012)  . Chronic kidney disease     "as a result of my heart failing" (03/28/2012)  . Fine motor impairment     "fingers right hand" (03/28/2012)  . Right leg swelling 10/06/2013    Asymmetric; no calf  pain, venous doppler negative for DVT 10/06/13    Past Surgical History  Procedure Laterality Date  . Mitral valve replacement  2007    with tricuspid annuloplasty  . Amputation      left atrial appendage amputated with MVR  . Craniotomy  2008    for SDH  . Cholecystectomy    . Tonsillectomy    . Percutaneous balloon valvuloplasty  1995    At [facility] with subsequent mechanical mitral valve replacement and tricuspid annuloplasty at [facility]  . Atrial ablation surgery      CTI and R atriostomy scar flutter ablation at [facility] 8/12,  repeat  ablation at [facility]  9/12  . Cardiac valve replacement    . Tonsillectomy      "when I was a child"  . Total abdominal hysterectomy  ?1995    TAH; BSO  . Biventricular pacemaker placement  03/28/2012    SJM Anthem implanted by Dr Lovena Le  . Cardioversion  2010-2011    "3 at [facility]" (03/28/2012)  . Cardiac electrophysiology mapping and ablation  2012    "2 @ [facility]" (03/28/2012  . Bi-ventricular pacemaker insertion N/A 03/28/2012    Procedure: BI-VENTRICULAR PACEMAKER INSERTION (CRT-P);  Surgeon: Evans Lance, MD;  Location: [facility] CATH LAB;  Service: Cardiovascular;  Laterality: N/A;    History   Social History  . Marital Status: Married    Spouse Name: Alease Medina   . Number of Children: 0  . Years of Education: college   Occupational History  . PHYSICIAN     retired   Social History Main Topics  . Smoking status: Former Smoker -- 0.10 packs/day for 6 years    Types: Cigarettes  . Smokeless tobacco: Never Used     Comment: 03/28/2012 "smoked in the 1970's"  . Alcohol Use: 0.0 oz/week    0 Standard drinks or equivalent per week     Comment: wine rarely.  . Drug Use: No  . Sexual Activity: Not on file   Other Topics Concern  . 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  Quit smoking 25 years ago and quit alcohol 2 years ago.   Denies alcohol, caffeine,  and  illicit drug use.      ROS: no fevers or chills, productive cough, hemoptysis, dysphasia, odynophagia, melena, hematochezia, dysuria, hematuria, rash, seizure activity, orthopnea, PND, pedal edema, claudication. Remaining systems are negative.  Physical Exam: Well-developed well-nourished in no acute distress.  Skin is warm and dry.  HEENT is normal.  Neck is supple.  Chest is clear to auscultation with normal expansion.  Cardiovascular exam is regular rate and rhythm.  Abdominal exam nontender or distended. No masses palpated. Extremities show no edema. neuro grossly intact

## 2014-10-15 NOTE — Telephone Encounter (Signed)
Tell her levels have been stable - we can wait until she gets back - [place]!

## 2014-10-16 DIAGNOSIS — N2581 Secondary hyperparathyroidism of renal origin: Secondary | ICD-10-CM | POA: Diagnosis not present

## 2014-10-16 DIAGNOSIS — D631 Anemia in chronic kidney disease: Secondary | ICD-10-CM | POA: Diagnosis not present

## 2014-10-16 DIAGNOSIS — I10 Essential (primary) hypertension: Secondary | ICD-10-CM | POA: Diagnosis not present

## 2014-10-16 DIAGNOSIS — N189 Chronic kidney disease, unspecified: Secondary | ICD-10-CM | POA: Diagnosis not present

## 2014-10-21 ENCOUNTER — Ambulatory Visit (INDEPENDENT_AMBULATORY_CARE_PROVIDER_SITE_OTHER): Payer: Medicare Other | Admitting: Cardiology

## 2014-10-21 ENCOUNTER — Encounter: Payer: Self-pay | Admitting: Cardiology

## 2014-10-21 VITALS — BP 100/60 | HR 88 | Ht 63.0 in | Wt 157.7 lb

## 2014-10-21 DIAGNOSIS — I482 Chronic atrial fibrillation, unspecified: Secondary | ICD-10-CM

## 2014-10-21 DIAGNOSIS — Z950 Presence of cardiac pacemaker: Secondary | ICD-10-CM

## 2014-10-21 DIAGNOSIS — I10 Essential (primary) hypertension: Secondary | ICD-10-CM

## 2014-10-21 DIAGNOSIS — I5042 Chronic combined systolic (congestive) and diastolic (congestive) heart failure: Secondary | ICD-10-CM

## 2014-10-21 DIAGNOSIS — I429 Cardiomyopathy, unspecified: Secondary | ICD-10-CM | POA: Diagnosis not present

## 2014-10-21 DIAGNOSIS — D6861 Antiphospholipid syndrome: Secondary | ICD-10-CM

## 2014-10-21 DIAGNOSIS — Z9889 Other specified postprocedural states: Secondary | ICD-10-CM

## 2014-10-21 DIAGNOSIS — N184 Chronic kidney disease, stage 4 (severe): Secondary | ICD-10-CM

## 2014-10-21 NOTE — Assessment & Plan Note (Signed)
Patient is euvolemic.she is very volume sensitive. Continue present dose of Lasix. She is planning to travel to [place] next week. She will weigh herself daily and take additional 20 mg of Lasix for weight gain of 2-3 pounds. She understands low-sodium diet and fluid restriction.

## 2014-10-21 NOTE — Assessment & Plan Note (Signed)
Blood pressure controlled. Continue present medications. 

## 2014-10-21 NOTE — Assessment & Plan Note (Signed)
Continue Toprol. No ACE inhibitor given renal insufficiency. Blood pressure will not allow hydralazine nitrates. Repeat echocardiogram June 2016.

## 2014-10-21 NOTE — Assessment & Plan Note (Signed)
Continue beta-blocker and anticoagulation. ?

## 2014-10-21 NOTE — Patient Instructions (Signed)
Your physician recommends that you schedule a follow-up appointment in: 3 MONTHS WITH DR CRENSHAW  

## 2014-10-21 NOTE — Assessment & Plan Note (Signed)
Continue anticoagulation 

## 2014-10-21 NOTE — Assessment & Plan Note (Signed)
Followed by nephrology. 

## 2014-10-21 NOTE — Assessment & Plan Note (Signed)
Continue SBE prophylaxis. 

## 2014-10-21 NOTE — Assessment & Plan Note (Signed)
Followed by electrophysiology. 

## 2014-11-19 ENCOUNTER — Other Ambulatory Visit (HOSPITAL_COMMUNITY): Payer: Self-pay | Admitting: Cardiology

## 2014-11-19 ENCOUNTER — Telehealth: Payer: Self-pay | Admitting: *Deleted

## 2014-11-19 ENCOUNTER — Other Ambulatory Visit: Payer: Self-pay

## 2014-11-19 MED ORDER — METOPROLOL SUCCINATE ER 50 MG PO TB24: 50.0000 mg | ORAL_TABLET | Freq: Two times a day (BID) | ORAL | Status: DC

## 2014-11-19 NOTE — Telephone Encounter (Signed)
[facility] is requesting refill on Fondaparinux 2.5mg /0.15ml inj ml #9.5 - last refill 08/10/14. Hilda Blades Jaionna Weisse RN 11/19/14 9:45AM

## 2014-11-20 ENCOUNTER — Other Ambulatory Visit: Payer: Self-pay | Admitting: Oncology

## 2014-11-20 DIAGNOSIS — D6861 Antiphospholipid syndrome: Secondary | ICD-10-CM

## 2014-11-20 MED ORDER — FONDAPARINUX SODIUM 5 MG/0.4ML ~~LOC~~ SOLN: 5.0000 mg | SUBCUTANEOUS | Status: DC

## 2014-11-20 NOTE — Telephone Encounter (Signed)
Rx called in to pharmacy per Dr Beryle Beams with 11 refills.

## 2014-11-24 ENCOUNTER — Telehealth: Payer: Self-pay | Admitting: Cardiology

## 2014-11-24 ENCOUNTER — Ambulatory Visit (INDEPENDENT_AMBULATORY_CARE_PROVIDER_SITE_OTHER): Payer: Medicare Other | Admitting: *Deleted

## 2014-11-24 DIAGNOSIS — I495 Sick sinus syndrome: Secondary | ICD-10-CM | POA: Diagnosis not present

## 2014-11-24 DIAGNOSIS — I429 Cardiomyopathy, unspecified: Secondary | ICD-10-CM

## 2014-11-24 NOTE — Progress Notes (Signed)
Remote pacemaker transmission.   

## 2014-11-24 NOTE — Telephone Encounter (Signed)
Spoke with pt, aware echo is needed in June. Order placed and sent to admin to call patient to schedule.

## 2014-11-24 NOTE — Telephone Encounter (Signed)
Pt called in wanting to know if Dr. Stanford Breed still wanted her to have the Echo and if so when would he like for her to have it done. Please call  Thanks

## 2014-11-26 ENCOUNTER — Other Ambulatory Visit: Payer: Self-pay | Admitting: Oncology

## 2014-11-26 DIAGNOSIS — Z7901 Long term (current) use of anticoagulants: Secondary | ICD-10-CM

## 2014-11-26 DIAGNOSIS — I480 Paroxysmal atrial fibrillation: Secondary | ICD-10-CM

## 2014-11-26 DIAGNOSIS — D6861 Antiphospholipid syndrome: Secondary | ICD-10-CM

## 2014-11-26 DIAGNOSIS — N184 Chronic kidney disease, stage 4 (severe): Secondary | ICD-10-CM

## 2014-11-29 LAB — CUP PACEART REMOTE DEVICE CHECK
Battery Remaining Percentage: 74 %
Battery Voltage: 2.95 V
Lead Channel Impedance Value: 1250 Ohm
Lead Channel Pacing Threshold Amplitude: 0.625 V
Lead Channel Pacing Threshold Pulse Width: 0.5 ms
Lead Channel Pacing Threshold Pulse Width: 0.8 ms
Lead Channel Sensing Intrinsic Amplitude: 9.5 mV
Lead Channel Setting Pacing Pulse Width: 0.5 ms
Lead Channel Setting Pacing Pulse Width: 0.8 ms
MDC IDC MSMT BATTERY REMAINING LONGEVITY: 76 mo
MDC IDC MSMT LEADCHNL RV IMPEDANCE VALUE: 430 Ohm
MDC IDC MSMT LEADCHNL RV PACING THRESHOLD AMPLITUDE: 0.75 V
MDC IDC SESS DTM: 20160531145154
MDC IDC SET LEADCHNL LV PACING AMPLITUDE: 2 V
MDC IDC SET LEADCHNL RV PACING AMPLITUDE: 2 V
MDC IDC SET LEADCHNL RV SENSING SENSITIVITY: 2 mV
Pulse Gen Serial Number: 2786353

## 2014-11-30 ENCOUNTER — Ambulatory Visit (HOSPITAL_COMMUNITY)
Admission: RE | Admit: 2014-11-30 | Discharge: 2014-11-30 | Disposition: A | Discharge status: Home / Self Care | Payer: Medicare Other | Source: Ambulatory Visit | Attending: Cardiology | Admitting: Cardiology

## 2014-11-30 ENCOUNTER — Telehealth: Payer: Self-pay | Admitting: Oncology

## 2014-11-30 DIAGNOSIS — I429 Cardiomyopathy, unspecified: Secondary | ICD-10-CM | POA: Insufficient documentation

## 2014-11-30 DIAGNOSIS — I5042 Chronic combined systolic (congestive) and diastolic (congestive) heart failure: Secondary | ICD-10-CM

## 2014-11-30 NOTE — Telephone Encounter (Signed)
Lft msg for pt confirming labs added per 06/02 POF mailed out schedule to pt... KJ

## 2014-12-07 ENCOUNTER — Other Ambulatory Visit (HOSPITAL_BASED_OUTPATIENT_CLINIC_OR_DEPARTMENT_OTHER): Payer: Medicare Other

## 2014-12-07 ENCOUNTER — Ambulatory Visit
Admission: RE | Admit: 2014-12-07 | Discharge: 2014-12-07 | Disposition: A | Discharge status: Home / Self Care | Payer: Medicare Other | Source: Ambulatory Visit

## 2014-12-07 DIAGNOSIS — D6861 Antiphospholipid syndrome: Secondary | ICD-10-CM | POA: Diagnosis not present

## 2014-12-07 DIAGNOSIS — N184 Chronic kidney disease, stage 4 (severe): Secondary | ICD-10-CM

## 2014-12-07 DIAGNOSIS — Z1231 Encounter for screening mammogram for malignant neoplasm of breast: Secondary | ICD-10-CM

## 2014-12-07 DIAGNOSIS — I480 Paroxysmal atrial fibrillation: Secondary | ICD-10-CM

## 2014-12-07 DIAGNOSIS — Z7901 Long term (current) use of anticoagulants: Secondary | ICD-10-CM | POA: Diagnosis not present

## 2014-12-07 LAB — BASIC METABOLIC PANEL (CC13)
ANION GAP: 11 meq/L (ref 3–11)
BUN: 68.9 mg/dL — ABNORMAL HIGH (ref 7.0–26.0)
CO2: 27 mEq/L (ref 22–29)
Calcium: 9 mg/dL (ref 8.4–10.4)
Chloride: 104 mEq/L (ref 98–109)
Creatinine: 2.3 mg/dL — ABNORMAL HIGH (ref 0.6–1.1)
EGFR: 21 mL/min/{1.73_m2} — AB (ref >90)
Glucose: 87 mg/dl (ref 70–140)
Potassium: 4.3 mEq/L (ref 3.5–5.1)
Sodium: 142 mEq/L (ref 136–145)

## 2014-12-07 LAB — CBC WITH DIFFERENTIAL/PLATELET
BASO%: 1.1 % (ref 0.0–2.0)
Basophils Absolute: 0.1 10*3/uL (ref 0.0–0.1)
EOS%: 4.5 % (ref 0.0–7.0)
Eosinophils Absolute: 0.2 10*3/uL (ref 0.0–0.5)
HEMATOCRIT: 34.9 % (ref 34.8–46.6)
HEMOGLOBIN: 11.4 g/dL — AB (ref 11.6–15.9)
LYMPH%: 25.9 % (ref 14.0–49.7)
MCH: 31.1 pg (ref 25.1–34.0)
MCHC: 32.7 g/dL (ref 31.5–36.0)
MCV: 95.4 fL (ref 79.5–101.0)
MONO#: 0.4 10*3/uL (ref 0.1–0.9)
MONO%: 7.4 % (ref 0.0–14.0)
NEUT#: 3.3 10*3/uL (ref 1.5–6.5)
NEUT%: 61.1 % (ref 38.4–76.8)
PLATELETS: 81 10*3/uL — AB (ref 145–400)
RBC: 3.66 10*6/uL — ABNORMAL LOW (ref 3.70–5.45)
RDW: 13.1 % (ref 11.2–14.5)
WBC: 5.4 10*3/uL (ref 3.9–10.3)
lymph#: 1.4 10*3/uL (ref 0.9–3.3)

## 2014-12-08 ENCOUNTER — Ambulatory Visit (INDEPENDENT_AMBULATORY_CARE_PROVIDER_SITE_OTHER): Payer: Medicare Other | Admitting: Oncology

## 2014-12-08 ENCOUNTER — Encounter: Payer: Self-pay | Admitting: Oncology

## 2014-12-08 ENCOUNTER — Telehealth: Payer: Self-pay | Admitting: *Deleted

## 2014-12-08 ENCOUNTER — Telehealth: Payer: Self-pay | Admitting: Oncology

## 2014-12-08 VITALS — BP 112/70 | HR 83 | Temp 97.6°F | Ht 63.0 in | Wt 159.3 lb

## 2014-12-08 DIAGNOSIS — D696 Thrombocytopenia, unspecified: Secondary | ICD-10-CM

## 2014-12-08 DIAGNOSIS — N189 Chronic kidney disease, unspecified: Secondary | ICD-10-CM | POA: Diagnosis not present

## 2014-12-08 DIAGNOSIS — Z954 Presence of other heart-valve replacement: Secondary | ICD-10-CM

## 2014-12-08 DIAGNOSIS — Z950 Presence of cardiac pacemaker: Secondary | ICD-10-CM | POA: Diagnosis not present

## 2014-12-08 DIAGNOSIS — E039 Hypothyroidism, unspecified: Secondary | ICD-10-CM | POA: Diagnosis not present

## 2014-12-08 DIAGNOSIS — D6861 Antiphospholipid syndrome: Secondary | ICD-10-CM

## 2014-12-08 DIAGNOSIS — I129 Hypertensive chronic kidney disease with stage 1 through stage 4 chronic kidney disease, or unspecified chronic kidney disease: Secondary | ICD-10-CM

## 2014-12-08 DIAGNOSIS — I4891 Unspecified atrial fibrillation: Secondary | ICD-10-CM

## 2014-12-08 NOTE — Telephone Encounter (Signed)
[facility], left message for Amber, scheduling dept, to continue pt for every 2 months lab appts per Dr Beryle Beams.

## 2014-12-08 NOTE — Telephone Encounter (Signed)
Left message to confirm appointment for August, October, November. Mailed calendar.

## 2014-12-08 NOTE — Progress Notes (Signed)
Patient ID: [name], female   DOB: 26-Oct-1945, 69 y.o.   MRN: [id number] Hematology and Oncology Follow Up Visit  [name] [id number] June 10, 1946 69 y.o. 12/08/2014 12:21 PM   Principle Diagnosis: Encounter Diagnoses  Name Primary?  Marland Kitchen Antiphospholipid antibody with hypercoagulable state Yes  . 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 She has had recurrent thrombotic events including an episode of amaurosis fugax and a TIA in the past. She has chronic moderate thrombocytopenia. She had a initial mitral valvuloplasty at [facility]. She developed advanced mitral valve disease and had to undergo an emergency mitral valve replacement at [facility] when she went into acute congestive heart failure with a concomitant DIC picture. She had a mechanical mitral valve replacement. 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 She developed chronic renal insufficiency.  She suffered a subdural hematoma after minor head trauma while on coumadin requiring emergency evacuation. Attempts to go back on Coumadin resulted in a recurrent subdural. She had an anaphylactic reaction to vitamin K in attempt to reverse the Coumadin at time of the second intracranial bleed. 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  She has been maintained on Arixtra injections 2.5 mg every other day with a single 5 mg dose weekly despite her chronic renal dysfunction in view of the Coumadin related hemorrhages and the history of heparin-induced thrombocytopenia  .  Despite the mitral valve replacement, she has had refractory atrial arrhythmias. She has required DC cardioversion and a number of attempts at radiofrequency ablation. She had to have a pacemaker  placed. She is still in atrial fibrillation.    Interim History:   She's had no interim medical problems. She was able to take a trip to [place] with her husband. She did trip and fall down over there and struck the left side of her abdomen. She still having some residual discomfort. Currently no dyspnea or chest pain at rest. No palpitations. She continues to exercise on a regular basis.  Medications: reviewed  Allergies:  Allergies  Allergen Reactions  . Amiodarone Hcl Other (See Comments)    "torsades; v tach"  . 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 Vitamin K Anaphylaxis    IV only allergy  . Iodinated Diagnostic Agents Itching    "over 35 years ago" (03/28/2012)    Review of Systems: See history of present illness  Remaining ROS negative:   Physical Exam: Blood pressure 112/70, pulse 83, temperature 97.6 F (36.4 C), temperature source Oral, height 5\' 3"  (1.6 m), weight 159 lb 4.8 oz (72.258 kg), SpO2 99 %. Wt Readings from Last 3 Encounters:  12/08/14 159 lb 4.8 oz (72.258 kg)  10/21/14 157 lb 11.2 oz (71.532 kg)  08/04/14 156 lb 14.4 oz (71.169 kg)     General appearance: Well-nourished Caucasian woman HENNT: Pharynx no erythema, exudate, mass, or ulcer. No thyromegaly or thyroid nodules Lymph nodes: No cervical, supraclavicular, or axillary lymphadenopathy Breasts:  Lungs: Clear to auscultation, resonant to percussion throughout Heart: Regular rhythm, no murmur, no gallop, no rub, mechanical heart valve sounds.  no edema Abdomen: Soft, nontender, normal bowel sounds, no mass, no organomegaly Extremities: No edema, no calf tenderness Musculoskeletal: no joint deformities GU:  Vascular: Carotid pulses 2+, no bruits,  Neurologic: Alert, oriented, PERRLA, optic discs blurred - no  change from baseline, vessels normal, no hemorrhage or exudate, cranial nerves grossly normal, motor strength 5 over 5, reflexes 1+ symmetric, upper body coordination normal, gait  normal, Skin: No rash or ecchymosis  Lab Results: CBC W/Diff    Component Value Date/Time   WBC 5.4 12/07/2014 1325   WBC 5.0 08/04/2014 1150   RBC 3.66* 12/07/2014 1325   RBC 4.02 08/04/2014 1150   RBC 4.02 08/04/2014 1150   HGB 11.4* 12/07/2014 1325   HGB 12.0 08/04/2014 1150   HCT 34.9 12/07/2014 1325   HCT 35.8* 08/04/2014 1150   PLT 81* 12/07/2014 1325   PLT 97* 08/04/2014 1150   MCV 95.4 12/07/2014 1325   MCV 89.1 08/04/2014 1150   MCH 31.1 12/07/2014 1325   MCH 29.9 08/04/2014 1150   MCHC 32.7 12/07/2014 1325   MCHC 33.5 08/04/2014 1150   RDW 13.1 12/07/2014 1325   RDW 13.9 08/04/2014 1150   LYMPHSABS 1.4 12/07/2014 1325   LYMPHSABS 0.9 08/04/2014 1150   MONOABS 0.4 12/07/2014 1325   MONOABS 0.4 08/04/2014 1150   EOSABS 0.2 12/07/2014 1325   EOSABS 0.2 08/04/2014 1150   BASOSABS 0.1 12/07/2014 1325   BASOSABS 0.1 08/04/2014 1150     Chemistry      Component Value Date/Time   NA 142 12/07/2014 1326   NA 139 08/04/2014 1150   K 4.3 12/07/2014 1326   K 4.2 08/04/2014 1150   CL 101 08/04/2014 1150   CL 108* 11/04/2012 1158   CO2 27 12/07/2014 1326   CO2 30 08/04/2014 1150   BUN 68.9* 12/07/2014 1326   BUN 58* 08/04/2014 1150   CREATININE 2.3* 12/07/2014 1326   CREATININE 1.86* 08/04/2014 1150   CREATININE 1.92* 06/02/2014 1501      Component Value Date/Time   CALCIUM 9.0 12/07/2014 1326   CALCIUM 9.2 08/04/2014 1150   ALKPHOS 112 10/12/2014 1313   ALKPHOS 125* 08/04/2014 1150   AST 30 10/12/2014 1313   AST 31 08/04/2014 1150   ALT 22 10/12/2014 1313   ALT 34 08/04/2014 1150   BILITOT 0.40 10/12/2014 1313   BILITOT 0.6 08/04/2014 1150       Radiological Studies: Mm Digital Screening Bilateral  12/07/2014   CLINICAL DATA:  Screening.  EXAM: DIGITAL SCREENING BILATERAL MAMMOGRAM WITH CAD  COMPARISON:  Previous exam(s).  ACR Breast Density Category b: There are scattered areas of fibroglandular density.  FINDINGS: There are no findings suspicious  for malignancy. Images were processed with CAD.  IMPRESSION: No mammographic evidence of malignancy. A result letter of this screening mammogram will be mailed directly to the patient.  RECOMMENDATION: Screening mammogram in one year. (Code:[id number])  BI-RADS CATEGORY  1: Negative.   Electronically Signed   By: Conchita Paris M.D.   On: 12/07/2014 14:21    Impression:   #1. Antiphospholipid antibody syndrome status post multiple thrombotic events  She remains stable on Arixtra anticoagulation.  Plan: Continue long-term anticoagulation.  Target Arixtra level 0.8-1.1. She currently takes a single additional 5 mg dose per week and otherwise will stay on 2.5 mg every other day   #2. Chronic mild thrombocytopenia secondary to #1  Platelet count fluctuates lowest value in the last year 66,000, average 70-80000.  #3. Chronic renal insufficiency likely due to perioperative complications at time of mitral valve replacement in 2007.  Previous baseline creatinine 1.5-1.8. Over the last 12 months slow deterioration with new baseline 1.8-2.3. She is followed closely by nephrology, Dr. Lorrene Reid.  #4. Mitral valve disease likely  secondary to #1. Status post initial valvuloplasty then emergency mechanical mitral valve replacement 2007   #5. Atrial fibrillation/flutter- now status post pacemaker implantation having failed DC cardioversion and a radiofrequency ablation procedure.   #6. Essential hypertension  Blood pressure low on current medications (Lasix and metoprolol dose)  #7. Hypothyroid on replacement  CC: Patient Care Team: Josetta Huddle, MD as PCP - General (Internal Medicine) Annia Belt, MD as Consulting Physician (Oncology) Jamal Maes, MD as Consulting Physician (Nephrology) Lelon Perla, MD as Consulting Physician (Cardiology) Thompson Grayer, MD as Consulting Physician (Cardiology)   Annia Belt, MD 6/14/201612:21 PM

## 2014-12-08 NOTE — Patient Instructions (Signed)
Continue every 2 month labs Return visit 4 months

## 2014-12-09 ENCOUNTER — Encounter: Payer: Self-pay | Admitting: Oncology

## 2014-12-09 LAB — HEPARIN ANTI-XA: Heparin Anti-Xa: 1.53 IU/mL

## 2014-12-09 NOTE — Progress Notes (Signed)
Patient ID: [name], female   DOB: 10/12/1945, 69 y.o.   MRN: [id number] Peak Arixtra level 1.5 after a 5 mg dose. I will decrease to 2.5 mg every other day. (previous 1 extra 5 mg dose per week). Text message sent to patient. Repeat peak level after a 2.5 mg dose in 2 weeks.

## 2014-12-14 ENCOUNTER — Encounter: Payer: Self-pay | Admitting: Cardiology

## 2014-12-16 ENCOUNTER — Encounter: Payer: Self-pay | Admitting: Internal Medicine

## 2014-12-18 ENCOUNTER — Telehealth: Payer: Self-pay | Admitting: Oncology

## 2014-12-18 NOTE — Telephone Encounter (Signed)
Patient called to reschedule her lab appointment dates/times to coordinate with the day she takes her meds. Per patient labs need to be scheduled according to med schedule and has to be on specific dates/times. Per patient 8/9 lab moved to 8/8 at 1 pm - 10/4 lab moved to 10/3 at 1 pm - 11/29 lab moved to 11/28 at 1 pm. Patient has new dates/times.

## 2014-12-21 ENCOUNTER — Other Ambulatory Visit: Payer: Self-pay

## 2015-01-01 DIAGNOSIS — D631 Anemia in chronic kidney disease: Secondary | ICD-10-CM | POA: Diagnosis not present

## 2015-01-01 DIAGNOSIS — N2581 Secondary hyperparathyroidism of renal origin: Secondary | ICD-10-CM | POA: Diagnosis not present

## 2015-01-01 DIAGNOSIS — N189 Chronic kidney disease, unspecified: Secondary | ICD-10-CM | POA: Diagnosis not present

## 2015-01-01 DIAGNOSIS — I10 Essential (primary) hypertension: Secondary | ICD-10-CM | POA: Diagnosis not present

## 2015-01-04 ENCOUNTER — Other Ambulatory Visit (HOSPITAL_BASED_OUTPATIENT_CLINIC_OR_DEPARTMENT_OTHER): Payer: Medicare Other

## 2015-01-04 ENCOUNTER — Ambulatory Visit: Payer: Medicare Other

## 2015-01-04 DIAGNOSIS — D599 Acquired hemolytic anemia, unspecified: Secondary | ICD-10-CM

## 2015-01-04 DIAGNOSIS — D6862 Lupus anticoagulant syndrome: Secondary | ICD-10-CM | POA: Diagnosis not present

## 2015-01-04 DIAGNOSIS — D696 Thrombocytopenia, unspecified: Secondary | ICD-10-CM

## 2015-01-04 DIAGNOSIS — D6861 Antiphospholipid syndrome: Secondary | ICD-10-CM

## 2015-01-04 DIAGNOSIS — Z7901 Long term (current) use of anticoagulants: Secondary | ICD-10-CM

## 2015-01-04 DIAGNOSIS — N183 Chronic kidney disease, stage 3 unspecified: Secondary | ICD-10-CM

## 2015-01-04 LAB — CBC WITH DIFFERENTIAL/PLATELET
BASO%: 1.3 % (ref 0.0–2.0)
BASOS ABS: 0.1 10*3/uL (ref 0.0–0.1)
EOS%: 4.6 % (ref 0.0–7.0)
Eosinophils Absolute: 0.3 10*3/uL (ref 0.0–0.5)
HCT: 35.4 % (ref 34.8–46.6)
HGB: 11.7 g/dL (ref 11.6–15.9)
LYMPH#: 1 10*3/uL (ref 0.9–3.3)
LYMPH%: 17.5 % (ref 14.0–49.7)
MCH: 30.8 pg (ref 25.1–34.0)
MCHC: 33.1 g/dL (ref 31.5–36.0)
MCV: 93.1 fL (ref 79.5–101.0)
MONO#: 0.3 10*3/uL (ref 0.1–0.9)
MONO%: 5.7 % (ref 0.0–14.0)
NEUT#: 4.2 10*3/uL (ref 1.5–6.5)
NEUT%: 70.9 % (ref 38.4–76.8)
Platelets: 73 10*3/uL — ABNORMAL LOW (ref 145–400)
RBC: 3.81 10*6/uL (ref 3.70–5.45)
RDW: 13 % (ref 11.2–14.5)
WBC: 5.9 10*3/uL (ref 3.9–10.3)

## 2015-01-04 LAB — COMPREHENSIVE METABOLIC PANEL (CC13)
ALBUMIN: 3.7 g/dL (ref 3.5–5.0)
ALT: 30 U/L (ref 0–55)
AST: 30 U/L (ref 5–34)
Alkaline Phosphatase: 136 U/L (ref 40–150)
Anion Gap: 10 mEq/L (ref 3–11)
BUN: 82 mg/dL — ABNORMAL HIGH (ref 7.0–26.0)
CALCIUM: 9.1 mg/dL (ref 8.4–10.4)
CO2: 29 meq/L (ref 22–29)
Chloride: 100 mEq/L (ref 98–109)
Creatinine: 2.5 mg/dL — ABNORMAL HIGH (ref 0.6–1.1)
EGFR: 19 mL/min/{1.73_m2} — ABNORMAL LOW (ref >90)
GLUCOSE: 161 mg/dL — AB (ref 70–140)
Potassium: 4.2 mEq/L (ref 3.5–5.1)
Sodium: 138 mEq/L (ref 136–145)
Total Bilirubin: 0.43 mg/dL (ref 0.20–1.20)
Total Protein: 7.3 g/dL (ref 6.4–8.3)

## 2015-01-05 ENCOUNTER — Telehealth: Payer: Self-pay | Admitting: Cardiology

## 2015-01-05 NOTE — Telephone Encounter (Signed)
Received records from [facility] for appointment on 01/25/15 with Dr Stanford Breed.  Records given to  D Chavis (medical records) for Dr Jacalyn Lefevre schedule on 01/25/15. lp

## 2015-01-07 LAB — HEPARIN ANTI-XA: Heparin Anti-Xa: 0.96 IU/mL

## 2015-01-21 NOTE — Progress Notes (Signed)
HPI: FU rheumatic heart disease, atrial tachycardia/flutter, and combined systolic/diastolic congestive heart failure. The patient had mitral valvuloplasty at [facility] in the late 90s. In 2007 she had a mechanical mitral valve replacement at [facility]. 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 She's had a prior subdural hematoma that required evacuation. She also has a history of heparin-induced thrombocytopenia. She is followed by Dr Rayann Heman for SVT and is s/p ablation at [facility]; also with h/o torsades with amiodarone. Has had previous biventricular pacemaker. Nuclear study October 2015 showed an ejection fraction of 35%. There was a fixed anterior/apical defect consistent with scar. No ischemia. Echo 6/16 showed EF 35-40, trace AI, mechanical MV, moderate to severe LAE, mildly reduced RV function. Patient is very volume sensitive. Since last seen, she denies dyspnea, chest pain or syncope. Occasional mild dizziness.  Current Outpatient Prescriptions  Medication Sig Dispense Refill  . acetaminophen (TYLENOL) 650 MG CR tablet Take 650 mg by mouth daily as needed for pain.     Marland Kitchen AMOXICILLIN PO Take 4 capsules by mouth as directed. ONLY WHEN VISITING DENTIST    . Calcium-Magnesium (CAL-MAG PO) Take 1 tablet by mouth daily.     . Cholecalciferol (VITAMIN D) 2000 UNITS tablet Take 2,000 Units by mouth daily.      Marland Kitchen co-enzyme Q-10 30 MG capsule Take 100 mg by mouth daily.    . Estradiol Acetate (FEMRING) 0.05 MG/24HR RING Place 1 each vaginally as directed.     . fondaparinux (ARIXTRA) 5 MG/0.4ML SOLN injection Inject 0.4 mLs (5 mg total) into the skin daily. 5mg  every Monday, 2.5 mg on Wednesdays, Fridays, Sundays (Patient taking differently: Inject 5 mg into the skin daily. Every other day) 4 mL 11  . furosemide (LASIX) 40 MG tablet Take 40 mg by mouth every other day. Alternating with Lasix  20 mg    . levothyroxine (SYNTHROID, LEVOTHROID) 88 MCG tablet Take 88 mcg by mouth daily before breakfast.    . metoprolol succinate (TOPROL-XL) 50 MG 24 hr tablet Take 1 tablet (50 mg total) by mouth 2 (two) times daily. Take with or immediately following a meal. 180 tablet 3  . valACYclovir (VALTREX) 1000 MG tablet Take 1,000 mg by mouth 2 (two) times daily. For a total of 3 doses for labial herpes prn only     Current Facility-Administered Medications  Medication Dose Route Frequency Provider Last Rate Last Dose  . botulinum toxin Type A (BOTOX) injection 100 Units  100 Units Intramuscular Once Drema Dallas, DO      . botulinum toxin Type A (BOTOX) injection 25 Units  25 Units Intramuscular Once Drema Dallas, DO      . botulinum toxin Type A (BOTOX) injection 40 Units  40 Units Intramuscular Once Drema Dallas, DO         Past Medical History  Diagnosis Date  . Rheumatic heart disease 1995    post percutaneous valvuloplasty at [facility] with subsequent mechanical mitral valve replacement and tricuspid annuloplasty at [facility]  . Subdural hematoma 2009    on coumadin  . Heparin-induced thrombocytopenia   . HTN (hypertension)   . History of Hashimoto thyroiditis   . HLD (hyperlipidemia)   . Atrial flutter     atypical right atrial flutter s/p ablation at [facility] by Dr Clyda Hurdle  . Other activity(E029.9)  Torsades with amiodarone  . Pacemaker   . Antiphospholipid antibody syndrome   . Anemia   . Heart murmur   . CHF (congestive heart failure)   . Hypothyroidism   . History of blood transfusion     "lots; most in 2007 w/MVR OR"  . Cerebrovascular accident ?1990; 1993; 2007    residual "can't use my right hand; left visual field cut" (03/28/2012)  . Arthritis     "may have some" (03/28/2012)  . Chronic kidney disease     "as a result of my heart failing" (03/28/2012)  . Fine motor impairment     "fingers right hand" (03/28/2012)  . Right leg swelling 10/06/2013     Asymmetric; no calf pain, venous doppler negative for DVT 10/06/13    Past Surgical History  Procedure Laterality Date  . Mitral valve replacement  2007    with tricuspid annuloplasty  . Amputation      left atrial appendage amputated with MVR  . Craniotomy  2008    for SDH  . Cholecystectomy    . Tonsillectomy    . Percutaneous balloon valvuloplasty  1995    At [facility] with subsequent mechanical mitral valve replacement and tricuspid annuloplasty at [facility]  . Atrial ablation surgery      CTI and R atriostomy scar flutter ablation at [facility] 8/12,  repeat  ablation at [facility]  9/12  . Cardiac valve replacement    . Tonsillectomy      "when I was a child"  . Total abdominal hysterectomy  ?1995    TAH; BSO  . Biventricular pacemaker placement  03/28/2012    SJM Anthem implanted by Dr Lovena Le  . Cardioversion  2010-2011    "3 at [facility]" (03/28/2012)  . Cardiac electrophysiology mapping and ablation  2012    "2 @ [facility]" (03/28/2012  . Bi-ventricular pacemaker insertion N/A 03/28/2012    Procedure: BI-VENTRICULAR PACEMAKER INSERTION (CRT-P);  Surgeon: Evans Lance, MD;  Location: [facility] CATH LAB;  Service: Cardiovascular;  Laterality: N/A;    History   Social History  . Marital Status: Married    Spouse Name: Alease Medina   . Number of Children: 0  . Years of Education: college   Occupational History  . PHYSICIAN     retired   Social History Main Topics  . Smoking status: Former Smoker -- 0.10 packs/day for 6 years    Types: Cigarettes  . Smokeless tobacco: Never Used     Comment: 03/28/2012 "smoked in the 1970's"  . Alcohol Use: 0.0 oz/week    0 Standard drinks or equivalent per week     Comment: wine rarely.  . Drug Use: No  . Sexual Activity: Not on file   Other Topics Concern  . 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  Quit smoking 25 years ago and quit alcohol 2 years ago.   Denies  alcohol, caffeine,  and illicit drug use.      ROS: no fevers or chills, productive cough, hemoptysis, dysphasia, odynophagia, melena, hematochezia, dysuria, hematuria, rash, seizure activity, orthopnea, PND, pedal edema, claudication. Remaining systems are negative.  Physical Exam: Well-developed well-nourished in no acute distress.  Skin is warm and dry.  HEENT is normal.  Neck is supple.  Chest is clear to auscultation with normal expansion.  Cardiovascular exam is regular rate and rhythm. Crisp mechanical valve Abdominal exam nontender or  distended. No masses palpated. Extremities show no edema. neuro grossly intact  ECG ventricular pacing with underlying atrial fibrillation.

## 2015-01-25 ENCOUNTER — Ambulatory Visit (INDEPENDENT_AMBULATORY_CARE_PROVIDER_SITE_OTHER): Payer: Medicare Other | Admitting: Cardiology

## 2015-01-25 ENCOUNTER — Encounter: Payer: Self-pay | Admitting: Cardiology

## 2015-01-25 VITALS — BP 122/82 | HR 80 | Ht 63.0 in | Wt 156.7 lb

## 2015-01-25 DIAGNOSIS — I10 Essential (primary) hypertension: Secondary | ICD-10-CM | POA: Diagnosis not present

## 2015-01-25 DIAGNOSIS — I5042 Chronic combined systolic (congestive) and diastolic (congestive) heart failure: Secondary | ICD-10-CM

## 2015-01-25 DIAGNOSIS — Z950 Presence of cardiac pacemaker: Secondary | ICD-10-CM

## 2015-01-25 DIAGNOSIS — I429 Cardiomyopathy, unspecified: Secondary | ICD-10-CM

## 2015-01-25 DIAGNOSIS — Z9889 Other specified postprocedural states: Secondary | ICD-10-CM

## 2015-01-25 DIAGNOSIS — I480 Paroxysmal atrial fibrillation: Secondary | ICD-10-CM | POA: Diagnosis not present

## 2015-01-25 MED ORDER — METOPROLOL SUCCINATE ER 50 MG PO TB24: 50.0000 mg | ORAL_TABLET | Freq: Two times a day (BID) | ORAL | Status: DC

## 2015-01-25 NOTE — Patient Instructions (Signed)
Your physician recommends that you schedule a follow-up appointment in: 3 MONTHS WITH DR CRENSHAW  

## 2015-01-25 NOTE — Assessment & Plan Note (Signed)
Blood pressure controlled. Continue present medications. 

## 2015-01-25 NOTE — Assessment & Plan Note (Signed)
Continue beta-blocker and anticoagulation. ?

## 2015-01-25 NOTE — Assessment & Plan Note (Signed)
Patient appears to be euvolemic on examination. She is very volume sensitive. Continue present dose of Lasix. Nephrology follows her renal function.

## 2015-01-25 NOTE — Assessment & Plan Note (Signed)
Continue SBE prophylaxis. 

## 2015-01-25 NOTE — Assessment & Plan Note (Signed)
Followed by electrophysiology. 

## 2015-01-25 NOTE — Assessment & Plan Note (Signed)
Continue beta blocker. No ACE inhibitor or ARB because of renal insufficiency. I will not add hydralazine/nitrates as she has had problems with hypotension previously.

## 2015-02-01 ENCOUNTER — Other Ambulatory Visit (HOSPITAL_BASED_OUTPATIENT_CLINIC_OR_DEPARTMENT_OTHER): Payer: Medicare Other

## 2015-02-01 DIAGNOSIS — D6861 Antiphospholipid syndrome: Secondary | ICD-10-CM | POA: Diagnosis present

## 2015-02-01 DIAGNOSIS — N183 Chronic kidney disease, stage 3 unspecified: Secondary | ICD-10-CM

## 2015-02-01 DIAGNOSIS — Z7901 Long term (current) use of anticoagulants: Secondary | ICD-10-CM

## 2015-02-01 DIAGNOSIS — D6862 Lupus anticoagulant syndrome: Secondary | ICD-10-CM | POA: Diagnosis not present

## 2015-02-01 DIAGNOSIS — D696 Thrombocytopenia, unspecified: Secondary | ICD-10-CM

## 2015-02-01 DIAGNOSIS — D599 Acquired hemolytic anemia, unspecified: Secondary | ICD-10-CM

## 2015-02-01 LAB — COMPREHENSIVE METABOLIC PANEL (CC13)
ALT: 36 U/L (ref 0–55)
ANION GAP: 11 meq/L (ref 3–11)
AST: 37 U/L — AB (ref 5–34)
Albumin: 4.1 g/dL (ref 3.5–5.0)
Alkaline Phosphatase: 145 U/L (ref 40–150)
BUN: 45.7 mg/dL — ABNORMAL HIGH (ref 7.0–26.0)
CHLORIDE: 97 meq/L — AB (ref 98–109)
CO2: 30 mEq/L — ABNORMAL HIGH (ref 22–29)
Calcium: 9.7 mg/dL (ref 8.4–10.4)
Creatinine: 2 mg/dL — ABNORMAL HIGH (ref 0.6–1.1)
EGFR: 24 mL/min/{1.73_m2} — AB (ref >90)
Glucose: 89 mg/dl (ref 70–140)
Potassium: 4.4 mEq/L (ref 3.5–5.1)
SODIUM: 138 meq/L (ref 136–145)
TOTAL PROTEIN: 7.7 g/dL (ref 6.4–8.3)
Total Bilirubin: 0.83 mg/dL (ref 0.20–1.20)

## 2015-02-01 LAB — CBC WITH DIFFERENTIAL/PLATELET
BASO%: 1.3 % (ref 0.0–2.0)
Basophils Absolute: 0.1 10*3/uL (ref 0.0–0.1)
EOS%: 2.7 % (ref 0.0–7.0)
Eosinophils Absolute: 0.2 10*3/uL (ref 0.0–0.5)
HCT: 37.5 % (ref 34.8–46.6)
HGB: 12.4 g/dL (ref 11.6–15.9)
LYMPH%: 18 % (ref 14.0–49.7)
MCH: 30.6 pg (ref 25.1–34.0)
MCHC: 33 g/dL (ref 31.5–36.0)
MCV: 92.7 fL (ref 79.5–101.0)
MONO#: 0.3 10*3/uL (ref 0.1–0.9)
MONO%: 5.2 % (ref 0.0–14.0)
NEUT#: 4 10*3/uL (ref 1.5–6.5)
NEUT%: 72.8 % (ref 38.4–76.8)
Platelets: 78 10*3/uL — ABNORMAL LOW (ref 145–400)
RBC: 4.04 10*6/uL (ref 3.70–5.45)
RDW: 12.9 % (ref 11.2–14.5)
WBC: 5.5 10*3/uL (ref 3.9–10.3)
lymph#: 1 10*3/uL (ref 0.9–3.3)

## 2015-02-02 ENCOUNTER — Other Ambulatory Visit: Payer: Medicare Other

## 2015-02-03 ENCOUNTER — Telehealth: Payer: Self-pay | Admitting: *Deleted

## 2015-02-03 NOTE — Telephone Encounter (Signed)
-----   Message from Annia Belt, MD sent at 02/03/2015  4:07 PM EDT ----- Call Dr Kathyrn Drown: kidney function a little better BUN 46, creatinine 2.0 compared with 82 & 2.5 last time so probable effects of diuretic. Please fax copy to Dr Jamal Maes, Nephrology (not in [facility]) Please ask lab why we are having a problem getting lovenox levels back. Thanks DrG

## 2015-02-03 NOTE — Telephone Encounter (Signed)
Pt called / informed kidney fcn test is better; BUN 46 and Crea 2.0 as compared to previous results "probale effects to diuretic" per Dr Beryle Beams. Stated results are a lot better; asked about Glucose level which is 89 and lovenox level. I talked to Olivia Mackie in the lab about Lovenox level - stated it was drawn at the cancer ctr and usually takes about 5 days; not ran stat.

## 2015-02-04 LAB — HEPARIN ANTI-XA: HEPARIN ANTI-XA: 1.06 [IU]/mL

## 2015-02-04 NOTE — Telephone Encounter (Signed)
Copy of labs faxed to Dr Lorrene Reid @ [phone].

## 2015-02-08 ENCOUNTER — Ambulatory Visit (INDEPENDENT_AMBULATORY_CARE_PROVIDER_SITE_OTHER): Payer: Medicare Other | Admitting: Internal Medicine

## 2015-02-08 ENCOUNTER — Encounter: Payer: Self-pay | Admitting: Internal Medicine

## 2015-02-08 VITALS — BP 108/70 | HR 79 | Ht 63.0 in | Wt 155.0 lb

## 2015-02-08 DIAGNOSIS — I482 Chronic atrial fibrillation, unspecified: Secondary | ICD-10-CM

## 2015-02-08 DIAGNOSIS — Z950 Presence of cardiac pacemaker: Secondary | ICD-10-CM | POA: Diagnosis not present

## 2015-02-08 DIAGNOSIS — I5042 Chronic combined systolic (congestive) and diastolic (congestive) heart failure: Secondary | ICD-10-CM | POA: Diagnosis not present

## 2015-02-08 DIAGNOSIS — I495 Sick sinus syndrome: Secondary | ICD-10-CM | POA: Diagnosis not present

## 2015-02-08 DIAGNOSIS — Z85828 Personal history of other malignant neoplasm of skin: Secondary | ICD-10-CM | POA: Diagnosis not present

## 2015-02-08 DIAGNOSIS — L570 Actinic keratosis: Secondary | ICD-10-CM | POA: Diagnosis not present

## 2015-02-08 LAB — CUP PACEART INCLINIC DEVICE CHECK
Battery Remaining Longevity: 78 mo
Battery Voltage: 2.93 V
Brady Statistic RA Percent Paced: 0 %
Brady Statistic RA Percent Paced: 0 %
Brady Statistic RV Percent Paced: 96 %
Brady Statistic RV Percent Paced: 96 %
Date Time Interrogation Session: 20160815132955
Lead Channel Impedance Value: 1262.5 Ohm
Lead Channel Impedance Value: 462.5 Ohm
Lead Channel Impedance Value: 1262.5 Ohm
Lead Channel Impedance Value: 462.5 Ohm
Lead Channel Pacing Threshold Amplitude: 0.625 V
Lead Channel Pacing Threshold Amplitude: 0.75 V
Lead Channel Pacing Threshold Amplitude: 0.625 V
Lead Channel Pacing Threshold Amplitude: 0.75 V
Lead Channel Pacing Threshold Pulse Width: 0.5 ms
Lead Channel Pacing Threshold Pulse Width: 0.5 ms
Lead Channel Pacing Threshold Pulse Width: 0.8 ms
Lead Channel Sensing Intrinsic Amplitude: 9.6 mV
Lead Channel Sensing Intrinsic Amplitude: 9.6 mV
Lead Channel Setting Pacing Amplitude: 2 V
Lead Channel Setting Pacing Amplitude: 2 V
Lead Channel Setting Pacing Amplitude: 2 V
Lead Channel Setting Pacing Pulse Width: 0.8 ms
Lead Channel Setting Sensing Sensitivity: 2 mV
Lead Channel Setting Sensing Sensitivity: 2 mV
MDC IDC MSMT BATTERY REMAINING LONGEVITY: 78 mo
MDC IDC MSMT BATTERY VOLTAGE: 2.93 V
MDC IDC MSMT LEADCHNL LV PACING THRESHOLD PULSEWIDTH: 0.8 ms
MDC IDC PG SERIAL: 2786353
MDC IDC SESS DTM: 20160815153359
MDC IDC SET LEADCHNL LV PACING PULSEWIDTH: 0.8 ms
MDC IDC SET LEADCHNL RV PACING AMPLITUDE: 2 V
MDC IDC SET LEADCHNL RV PACING PULSEWIDTH: 0.5 ms
MDC IDC SET LEADCHNL RV PACING PULSEWIDTH: 0.5 ms
Pulse Gen Model: 3210
Pulse Gen Serial Number: 2786353

## 2015-02-08 NOTE — Patient Instructions (Signed)
Medication Instructions:  Your physician recommends that you continue on your current medications as directed. Please refer to the Current Medication list given to you today.   Labwork: None ordered  Testing/Procedures: None ordered  Follow-Up: Your physician wants you to follow-up in: 12 months with Dr Rayann Heman Dennis Bast will receive a reminder letter in the mail two months in advance. If you don't receive a letter, please call our office to schedule the follow-up appointment.  Remote monitoring is used to monitor your Pacemaker from home. This monitoring reduces the number of office visits required to check your device to one time per year. It allows [place] to keep an eye on the functioning of your device to ensure it is working properly. You are scheduled for a device check from home on 05/10/15. You may send your transmission at any time that day. If you have a wireless device, the transmission will be sent automatically. After your physician reviews your transmission, you will receive a postcard with your next transmission date.    Any Other Special Instructions Will Be Listed Below (If Applicable).

## 2015-02-08 NOTE — Progress Notes (Signed)
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  Since last being seen in our clinic, the patient reports doing well. She went to [place] in May and had a very nice trip.  Her spirits are good today. Today, she denies symptoms of palpitations, chest pain, shortness of breath,  lower extremity edema, dizziness, presyncope, or syncope.  The patient is otherwise without complaint today.   Past Medical History  Diagnosis Date  . Rheumatic heart disease 1995    post percutaneous valvuloplasty at [facility] with subsequent mechanical mitral valve replacement and tricuspid annuloplasty at [facility]  . Subdural hematoma 2009    on coumadin  . Heparin-induced thrombocytopenia   . HTN (hypertension)   . History of Hashimoto thyroiditis   . HLD (hyperlipidemia)   . Atrial flutter     atypical right atrial flutter s/p ablation at [facility] by Dr Clyda Hurdle  . 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 Pacemaker   . Antiphospholipid antibody syndrome   . Anemia   . Heart murmur   . CHF (congestive heart failure)   . Hypothyroidism   . History of blood transfusion     "lots; most in 2007 w/MVR OR"  . Cerebrovascular accident ?1990; 1993; 2007    residual "can't use my right hand; left visual field cut" (03/28/2012)  . Arthritis     "may have some" (03/28/2012)  . Chronic kidney disease     "as a result of my heart failing" (03/28/2012)  . Fine motor impairment     "fingers right hand" (03/28/2012)  . Right leg swelling 10/06/2013    Asymmetric; no calf pain, venous doppler negative for DVT 10/06/13   Past Surgical History  Procedure Laterality Date  . Mitral valve replacement  2007    with tricuspid annuloplasty  . Amputation      left atrial appendage amputated with MVR  . Craniotomy  2008    for SDH  . Cholecystectomy    . Tonsillectomy    . Percutaneous balloon  valvuloplasty  1995    At [facility] with subsequent mechanical mitral valve replacement and tricuspid annuloplasty at [facility]  . Atrial ablation surgery      CTI and R atriostomy scar flutter ablation at [facility] 8/12,  repeat  ablation at [facility]  9/12  . Cardiac valve replacement    . Tonsillectomy      "when I was a child"  . Total abdominal hysterectomy  ?1995    TAH; BSO  . Biventricular pacemaker placement  03/28/2012    SJM Anthem implanted by Dr Lovena Le  . Cardioversion  2010-2011    "3 at [facility]" (03/28/2012)  . Cardiac electrophysiology mapping and ablation  2012    "2 @ [facility]" (03/28/2012  . Bi-ventricular pacemaker insertion N/A 03/28/2012    Procedure: BI-VENTRICULAR PACEMAKER INSERTION (CRT-P);  Surgeon: Evans Lance, MD;  Location: [facility] CATH LAB;  Service: Cardiovascular;  Laterality: N/A;    Current Outpatient Prescriptions  Medication Sig Dispense Refill  . acetaminophen (TYLENOL) 650 MG CR tablet Take 650 mg by mouth daily as needed for pain.     Marland Kitchen AMOXICILLIN PO Take 4 capsules by mouth as directed. ONLY WHEN VISITING DENTIST    . Calcium-Magnesium (CAL-MAG PO) Take 1 tablet by mouth daily.     . Cholecalciferol (VITAMIN D) 2000 UNITS tablet Take 2,000 Units  by mouth daily.      . Coenzyme Q-10 100 MG capsule Take 100 mg by mouth daily.    . Estradiol Acetate (FEMRING) 0.05 MG/24HR RING Place 1 each vaginally as directed.     . fondaparinux (ARIXTRA) 5 MG/0.4ML SOLN injection Inject 5 mg into the skin every other day.    . furosemide (LASIX) 40 MG tablet Take 40 mg by mouth daily.     Marland Kitchen levothyroxine (SYNTHROID, LEVOTHROID) 88 MCG tablet Take 88 mcg by mouth daily before breakfast.    . metoprolol succinate (TOPROL-XL) 50 MG 24 hr tablet Take 1 tablet (50 mg total) by mouth 2 (two) times daily. Take with or immediately following a meal. 180 tablet 3  . valACYclovir (VALTREX) 1000 MG tablet Take 1,000 mg by mouth 2 (two) times daily. For a total of 3 doses for labial herpes  prn only     Current Facility-Administered Medications  Medication Dose Route Frequency Provider Last Rate Last Dose  . botulinum toxin Type A (BOTOX) injection 100 Units  100 Units Intramuscular Once Drema Dallas, DO      . botulinum toxin Type A (BOTOX) injection 25 Units  25 Units Intramuscular Once Drema Dallas, DO      . botulinum toxin Type A (BOTOX) injection 40 Units  40 Units Intramuscular Once Drema Dallas, DO        Physical Exam: Filed Vitals:   02/08/15 1120  BP: 108/70  Pulse: 79  Height: 5\' 3"  (1.6 m)  Weight: 70.308 kg (155 lb)    GEN- The patient is well appearing, alert and oriented x 3 today.   Head- normocephalic, atraumatic Eyes-  Sclera clear, conjunctiva pink Ears- hearing intact Oropharynx- clear Lungs- Clear to ausculation bilaterally, normal work of breathing Chest- pacemaker pocket is well healed Heart- Regular rate and rhythm (paced), mechanical S1 is crisp GI- soft, NT, ND, + BS Extremities- no clubbing, cyanosis, or edema  Pacemaker interrogation- reviewed in detail today,  See PACEART report  Assessment and Plan:  1. Tach/brady Normal BiV pacemaker function with 96% Biv pacing See Pace Art report No changes today  2. afib Continue long term rate control and anticoagulation. Given valvular disease and severe LA enlargement, she is not likely to achieve/ maintain sinus.  She is not a candidate for Genetic AF. As her heart rates are controlled, there is presently no indication for AV nodal ablation (she is 96% BiV paced)  3. Valvular heart disease Stable No change required today No changes at this time  Merlin Return in 1 year

## 2015-02-16 ENCOUNTER — Other Ambulatory Visit: Payer: Self-pay | Admitting: Cardiology

## 2015-02-16 NOTE — Telephone Encounter (Signed)
REFILL 

## 2015-03-29 ENCOUNTER — Other Ambulatory Visit: Payer: Self-pay | Admitting: Oncology

## 2015-03-29 ENCOUNTER — Encounter: Payer: Self-pay | Admitting: Oncology

## 2015-03-29 ENCOUNTER — Other Ambulatory Visit (HOSPITAL_BASED_OUTPATIENT_CLINIC_OR_DEPARTMENT_OTHER): Payer: Medicare Other

## 2015-03-29 ENCOUNTER — Ambulatory Visit (INDEPENDENT_AMBULATORY_CARE_PROVIDER_SITE_OTHER): Payer: Medicare Other | Admitting: Oncology

## 2015-03-29 VITALS — BP 114/72 | HR 94 | Temp 97.8°F | Ht 63.0 in | Wt 152.4 lb

## 2015-03-29 DIAGNOSIS — Z950 Presence of cardiac pacemaker: Secondary | ICD-10-CM | POA: Diagnosis not present

## 2015-03-29 DIAGNOSIS — E039 Hypothyroidism, unspecified: Secondary | ICD-10-CM | POA: Diagnosis not present

## 2015-03-29 DIAGNOSIS — D696 Thrombocytopenia, unspecified: Secondary | ICD-10-CM | POA: Diagnosis not present

## 2015-03-29 DIAGNOSIS — Z954 Presence of other heart-valve replacement: Secondary | ICD-10-CM | POA: Diagnosis not present

## 2015-03-29 DIAGNOSIS — I4891 Unspecified atrial fibrillation: Secondary | ICD-10-CM

## 2015-03-29 DIAGNOSIS — D6861 Antiphospholipid syndrome: Secondary | ICD-10-CM

## 2015-03-29 DIAGNOSIS — N189 Chronic kidney disease, unspecified: Secondary | ICD-10-CM | POA: Diagnosis not present

## 2015-03-29 DIAGNOSIS — Z7901 Long term (current) use of anticoagulants: Secondary | ICD-10-CM

## 2015-03-29 DIAGNOSIS — I272 Pulmonary hypertension, unspecified: Secondary | ICD-10-CM

## 2015-03-29 DIAGNOSIS — I129 Hypertensive chronic kidney disease with stage 1 through stage 4 chronic kidney disease, or unspecified chronic kidney disease: Secondary | ICD-10-CM | POA: Diagnosis not present

## 2015-03-29 DIAGNOSIS — D6862 Lupus anticoagulant syndrome: Secondary | ICD-10-CM | POA: Diagnosis not present

## 2015-03-29 DIAGNOSIS — G8110 Spastic hemiplegia affecting unspecified side: Secondary | ICD-10-CM

## 2015-03-29 DIAGNOSIS — Z862 Personal history of diseases of the blood and blood-forming organs and certain disorders involving the immune mechanism: Secondary | ICD-10-CM

## 2015-03-29 DIAGNOSIS — N183 Chronic kidney disease, stage 3 unspecified: Secondary | ICD-10-CM

## 2015-03-29 DIAGNOSIS — D599 Acquired hemolytic anemia, unspecified: Secondary | ICD-10-CM

## 2015-03-29 LAB — COMPREHENSIVE METABOLIC PANEL (CC13)
ALBUMIN: 3.9 g/dL (ref 3.5–5.0)
ALK PHOS: 95 U/L (ref 40–150)
ALT: 25 U/L (ref 0–55)
AST: 30 U/L (ref 5–34)
Anion Gap: 8 mEq/L (ref 3–11)
BILIRUBIN TOTAL: 0.63 mg/dL (ref 0.20–1.20)
BUN: 63.1 mg/dL — AB (ref 7.0–26.0)
CO2: 30 meq/L — AB (ref 22–29)
Calcium: 9.6 mg/dL (ref 8.4–10.4)
Chloride: 103 mEq/L (ref 98–109)
Creatinine: 2.3 mg/dL — ABNORMAL HIGH (ref 0.6–1.1)
EGFR: 21 mL/min/{1.73_m2} — ABNORMAL LOW (ref >90)
GLUCOSE: 94 mg/dL (ref 70–140)
POTASSIUM: 4.1 meq/L (ref 3.5–5.1)
SODIUM: 141 meq/L (ref 136–145)
TOTAL PROTEIN: 7.1 g/dL (ref 6.4–8.3)

## 2015-03-29 LAB — CBC WITH DIFFERENTIAL/PLATELET
BASO%: 1.7 % (ref 0.0–2.0)
Basophils Absolute: 0.1 10*3/uL (ref 0.0–0.1)
EOS%: 4.1 % (ref 0.0–7.0)
Eosinophils Absolute: 0.2 10*3/uL (ref 0.0–0.5)
HCT: 36.4 % (ref 34.8–46.6)
HEMOGLOBIN: 12.2 g/dL (ref 11.6–15.9)
LYMPH%: 21 % (ref 14.0–49.7)
MCH: 30.9 pg (ref 25.1–34.0)
MCHC: 33.6 g/dL (ref 31.5–36.0)
MCV: 91.8 fL (ref 79.5–101.0)
MONO#: 0.4 10*3/uL (ref 0.1–0.9)
MONO%: 7.1 % (ref 0.0–14.0)
NEUT%: 66.1 % (ref 38.4–76.8)
NEUTROS ABS: 3.6 10*3/uL (ref 1.5–6.5)
Platelets: 90 10*3/uL — ABNORMAL LOW (ref 145–400)
RBC: 3.96 10*6/uL (ref 3.70–5.45)
RDW: 12.4 % (ref 11.2–14.5)
WBC: 5.4 10*3/uL (ref 3.9–10.3)
lymph#: 1.1 10*3/uL (ref 0.9–3.3)

## 2015-03-29 NOTE — Progress Notes (Signed)
Patient ID: [name], female   DOB: 06-Feb-1946, 69 y.o.   MRN: [id number] Hematology and Oncology Follow Up Visit  KEARI MIU [id number] Aug 25, 1945 69 y.o. 03/29/2015 2:27 PM   Principle Diagnosis: Encounter Diagnoses  Name Primary?  . Thrombocytopenia ([place]) Yes  . Spastic hemiplegia affecting nondominant side ([place])   . Pulmonary hypertension ([place])   . 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 She has had recurrent thrombotic events including an episode of amaurosis fugax and a TIA in the past. She has chronic moderate thrombocytopenia. She had a initial mitral valvuloplasty at [facility]. She developed advanced mitral valve disease and had to undergo an emergency mitral valve replacement at [facility] when she went into acute congestive heart failure with a concomitant DIC picture. She had a mechanical mitral valve replacement. 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 She developed chronic renal insufficiency.  She suffered a subdural hematoma after minor head trauma while on coumadin requiring emergency evacuation. Attempts to go back on Coumadin resulted in a recurrent subdural. She had an anaphylactic reaction to vitamin K in attempt to reverse the Coumadin at time of the second intracranial bleed. 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  She has been maintained on Arixtra injections 2.5 mg every other day with a single 5 mg dose weekly despite her chronic renal dysfunction in view of the Coumadin related hemorrhages and the history of heparin-induced thrombocytopenia  .  Despite the mitral valve replacement, she has had refractory atrial arrhythmias. She has required  DC cardioversion and a number of attempts at radiofrequency ablation. She had to have a pacemaker placed. She is still in atrial fibrillation.    Interim History:   Overall stable since visit here in June. She got a little bit over diuresed. BUN up to 80, creatinine 2.5. Dose adjustments made. On August 8 BUN down to 46, creatinine 2.0. Today BUN 63, creatinine 2.3. I made a dose adjustment in her Arixtra based on peak level and in view of her progressive renal insufficiency. She is now on 2.5 mg subcutaneous every other day. She denies any new neurologic signs or symptoms. No headache. No change in vision. No new focal weakness. Cardiac overall stable. No chest pain or palpitations. Status post pacemaker. Status post mitral valve replacement. Status post multiple cardioversions. Currently off antiarrhythmics except for Toprol-XL.  Medications: reviewed  Allergies:  Allergies  Allergen Reactions  . Amiodarone Hcl Other (See Comments)    "torsades; v tach"  . 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 Vitamin K Anaphylaxis    IV only allergy  . Iodinated Diagnostic Agents Itching    "over 35 years ago" (03/28/2012)    Review of Systems: See HPI Remaining ROS negative:   Physical Exam: Blood pressure 114/72, pulse 94, temperature 97.8 F (36.6 C), temperature source Oral, height 5\' 3"  (1.6 m), weight 152 lb 6.4 oz (69.128 kg), SpO2 99 %. Wt Readings from Last 3 Encounters:  03/29/15 152 lb 6.4 oz (69.128 kg)  02/08/15 155 lb (70.308 kg)  01/25/15 156 lb 11.2 oz (71.079 kg)     General appearance: well nourished caucasian woman HENNT: Pharynx no erythema, exudate, mass, or ulcer. No thyromegaly or thyroid nodules Lymph nodes: No cervical, supraclavicular, or  axillary lymphadenopathy Breasts:  Lungs: Clear to auscultation, resonant to percussion throughout Heart: Regular rhythm, no murmur, no gallop, no rub, no click, no edema Abdomen: Soft, nontender, normal bowel sounds, no mass,  no organomegaly Extremities: No edema, no calf tenderness Musculoskeletal: no joint deformities GU:  Vascular: Carotid pulses 2+, no bruits, Neurologic: Alert, oriented, PERRLA, optic discs chronically blurry and vessels normal, no hemorrhage or exudate, cranial nerves grossly normal, motor strength 5 over 5, except chronic mild weakness and decreased hand grip and coordination left hand. reflexes 1+ symmetric, upper body coordination normal, gait normal, Skin: No rash or ecchymosis  Lab Results: CBC W/Diff    Component Value Date/Time   WBC 5.4 03/29/2015 1327   WBC 5.0 08/04/2014 1150   RBC 3.96 03/29/2015 1327   RBC 4.02 08/04/2014 1150   RBC 4.02 08/04/2014 1150   HGB 12.2 03/29/2015 1327   HGB 12.0 08/04/2014 1150   HCT 36.4 03/29/2015 1327   HCT 35.8* 08/04/2014 1150   PLT 90* 03/29/2015 1327   PLT 97* 08/04/2014 1150   MCV 91.8 03/29/2015 1327   MCV 89.1 08/04/2014 1150   MCH 30.9 03/29/2015 1327   MCH 29.9 08/04/2014 1150   MCHC 33.6 03/29/2015 1327   MCHC 33.5 08/04/2014 1150   RDW 12.4 03/29/2015 1327   RDW 13.9 08/04/2014 1150   LYMPHSABS 1.1 03/29/2015 1327   LYMPHSABS 0.9 08/04/2014 1150   MONOABS 0.4 03/29/2015 1327   MONOABS 0.4 08/04/2014 1150   EOSABS 0.2 03/29/2015 1327   EOSABS 0.2 08/04/2014 1150   BASOSABS 0.1 03/29/2015 1327   BASOSABS 0.1 08/04/2014 1150     Chemistry      Component Value Date/Time   NA 141 03/29/2015 1327   NA 139 08/04/2014 1150   K 4.1 03/29/2015 1327   K 4.2 08/04/2014 1150   CL 101 08/04/2014 1150   CL 108* 11/04/2012 1158   CO2 30* 03/29/2015 1327   CO2 30 08/04/2014 1150   BUN 63.1* 03/29/2015 1327   BUN 58* 08/04/2014 1150   CREATININE 2.3* 03/29/2015 1327   CREATININE 1.86* 08/04/2014 1150   CREATININE 1.92* 06/02/2014 1501      Component Value Date/Time   CALCIUM 9.6 03/29/2015 1327   CALCIUM 9.2 08/04/2014 1150   ALKPHOS 95 03/29/2015 1327   ALKPHOS 125* 08/04/2014 1150   AST 30 03/29/2015 1327   AST 31  08/04/2014 1150   ALT 25 03/29/2015 1327   ALT 34 08/04/2014 1150   BILITOT 0.63 03/29/2015 1327   BILITOT 0.6 08/04/2014 1150       Radiological Studies: No results found.  Impression:  #1. Antiphospholipid antibody syndrome status post multiple thrombotic events  She remains stable on Arixtra anticoagulation.  Plan: Continue long-term anticoagulation.  Target Arixtra level 0.8-1.1. Recently dose changed back to 2.5 mg every other day   #2. Chronic mild thrombocytopenia secondary to #1  Platelet count fluctuates lowest value in the last year 66,000, average 70-80,000.  #3. Chronic renal insufficiency likely due to perioperative complications at time of mitral valve replacement in 2007.  Previous baseline creatinine 1.5-1.8. Over the last 12 months slow deterioration with new baseline 1.8-2.3. She is followed closely by nephrology, Dr. Lorrene Reid.  #4. Mitral valve disease likely secondary to #1. Status post initial valvuloplasty then emergency mechanical mitral valve replacement 2007   #5. Atrial fibrillation/flutter- now status post pacemaker implantation having failed DC cardioversion and a radiofrequency ablation procedure.   #6. Essential hypertension  Blood pressure low on  current medications (Lasix and metoprolol dose)  #7. Hypothyroid on replacement  #8. 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 Coumadin permanently discontinued.  #10. History of anaphylactic reaction to IV vitamin K.   CC: Patient Care Team: Josetta Huddle, MD as PCP - General (Internal Medicine) Annia Belt, MD as Consulting Physician (Oncology) Jamal Maes, MD as Consulting Physician (Nephrology) Lelon Perla, MD as Consulting Physician (Cardiology) Thompson Grayer, MD as Consulting Physician (Cardiology)   Annia Belt, MD 10/3/20162:27 PM

## 2015-03-29 NOTE — Patient Instructions (Signed)
Continue Q 2 month standing lab orders at [facility] MD visit 4 months

## 2015-03-30 ENCOUNTER — Other Ambulatory Visit: Payer: Medicare Other

## 2015-03-31 LAB — HEPARIN ANTI-XA: HEPARIN ANTI-XA: 1.03 [IU]/mL

## 2015-04-01 DIAGNOSIS — N189 Chronic kidney disease, unspecified: Secondary | ICD-10-CM | POA: Diagnosis not present

## 2015-04-01 DIAGNOSIS — D631 Anemia in chronic kidney disease: Secondary | ICD-10-CM | POA: Diagnosis not present

## 2015-04-01 DIAGNOSIS — N2581 Secondary hyperparathyroidism of renal origin: Secondary | ICD-10-CM | POA: Diagnosis not present

## 2015-04-01 DIAGNOSIS — I10 Essential (primary) hypertension: Secondary | ICD-10-CM | POA: Diagnosis not present

## 2015-04-06 ENCOUNTER — Telehealth: Payer: Self-pay | Admitting: Cardiology

## 2015-04-06 NOTE — Telephone Encounter (Signed)
Received records from [facility] for appointment on 05/10/15 with Dr Stanford Breed.  Records given to [facility] (medical records) for Dr Jacalyn Lefevre schedule on 05/10/15. lp

## 2015-04-26 DIAGNOSIS — Z23 Encounter for immunization: Secondary | ICD-10-CM | POA: Diagnosis not present

## 2015-05-03 NOTE — Progress Notes (Signed)
HPI: FU rheumatic heart disease, atrial tachycardia/flutter, and combined systolic/diastolic congestive heart failure. The patient had mitral valvuloplasty at [facility] in the late 90s. In 2007 she had a mechanical mitral valve replacement at [facility]. 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 She's had a prior subdural hematoma that required evacuation. She also has a history of heparin-induced thrombocytopenia. She is followed by Dr Rayann Heman for SVT and is s/p ablation at [facility]; also with h/o torsades with amiodarone. Has had previous biventricular pacemaker. Nuclear study October 2015 showed an ejection fraction of 35%. There was a fixed anterior/apical defect consistent with scar. No ischemia. Echo 6/16 showed EF 35-40, trace AI, mechanical MV, moderate to severe LAE, mildly reduced RV function. Patient is very volume sensitive. Since last seen, the patient denies any dyspnea on exertion, orthopnea, PND, pedal edema, palpitations, syncope or chest pain.   Current Outpatient Prescriptions  Medication Sig Dispense Refill  . acetaminophen (TYLENOL) 650 MG CR tablet Take 650 mg by mouth daily as needed for pain.     Marland Kitchen AMOXICILLIN PO Take 4 capsules by mouth as directed. ONLY WHEN VISITING DENTIST    . Calcium-Magnesium (CAL-MAG PO) Take 1 tablet by mouth daily.     . Cholecalciferol (VITAMIN D) 2000 UNITS tablet Take 2,000 Units by mouth daily.      . Coenzyme Q-10 100 MG capsule Take 100 mg by mouth daily.    . Estradiol Acetate (FEMRING) 0.05 MG/24HR RING Place 1 each vaginally as directed.     . fondaparinux (ARIXTRA) 5 MG/0.4ML SOLN injection Inject 2.5 mg into the skin every other day.     . furosemide (LASIX) 20 MG tablet Take 1 tablet (20 mg total) by mouth every other day. 90 tablet 3  . furosemide (LASIX) 40 MG tablet Take 1 tablet (40 mg total) by mouth every other day. 90  tablet 3  . levothyroxine (SYNTHROID, LEVOTHROID) 88 MCG tablet Take 88 mcg by mouth daily before breakfast.    . metoprolol succinate (TOPROL-XL) 50 MG 24 hr tablet Take 1 tablet (50 mg total) by mouth 2 (two) times daily. Take with or immediately following a meal. 180 tablet 3  . Misc Natural Products (TART CHERRY ADVANCED PO) Take 2 tablets by mouth daily.    . valACYclovir (VALTREX) 1000 MG tablet Take 1,000 mg by mouth 2 (two) times daily. For a total of 3 doses for labial herpes prn only     Current Facility-Administered Medications  Medication Dose Route Frequency Provider Last Rate Last Dose  . botulinum toxin Type A (BOTOX) injection 100 Units  100 Units Intramuscular Once Drema Dallas, DO      . botulinum toxin Type A (BOTOX) injection 25 Units  25 Units Intramuscular Once Drema Dallas, DO      . botulinum toxin Type A (BOTOX) injection 40 Units  40 Units Intramuscular Once Drema Dallas, DO         Past Medical History  Diagnosis Date  . Rheumatic heart disease 1995    post percutaneous valvuloplasty at [facility] with subsequent mechanical mitral valve replacement and tricuspid annuloplasty at [facility]  . Subdural hematoma ([place]) 2009    on coumadin  . Heparin-induced thrombocytopenia ([place])   . HTN (hypertension)   . History of Hashimoto thyroiditis   . HLD (hyperlipidemia)   . Atrial  flutter ([place])     atypical right atrial flutter s/p ablation at [facility] by Dr Clyda Hurdle  . 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 Pacemaker   . Antiphospholipid antibody syndrome ([place])   . Anemia   . Heart murmur   . CHF (congestive heart failure) ([place])   . Hypothyroidism   . History of blood transfusion     "lots; most in 2007 w/MVR OR"  . Cerebrovascular accident [facility]) ?1990; 1993; 2007    residual "can't use my right hand; left visual field cut" (03/28/2012)  . Arthritis     "may have some" (03/28/2012)  . Chronic kidney disease     "as a result of my  heart failing" (03/28/2012)  . Fine motor impairment     "fingers right hand" (03/28/2012)  . Right leg swelling 10/06/2013    Asymmetric; no calf pain, venous doppler negative for DVT 10/06/13    Past Surgical History  Procedure Laterality Date  . Mitral valve replacement  2007    with tricuspid annuloplasty  . Amputation      left atrial appendage amputated with MVR  . Craniotomy  2008    for SDH  . Cholecystectomy    . Tonsillectomy    . Percutaneous balloon valvuloplasty  1995    At [facility] with subsequent mechanical mitral valve replacement and tricuspid annuloplasty at [facility]  . Atrial ablation surgery      CTI and R atriostomy scar flutter ablation at [facility] 8/12,  repeat  ablation at [facility]  9/12  . Cardiac valve replacement    . Tonsillectomy      "when I was a child"  . Total abdominal hysterectomy  ?1995    TAH; BSO  . Biventricular pacemaker placement  03/28/2012    SJM Anthem implanted by Dr Lovena Le  . Cardioversion  2010-2011    "3 at [facility]" (03/28/2012)  . Cardiac electrophysiology mapping and ablation  2012    "2 @ [facility]" (03/28/2012  . Bi-ventricular pacemaker insertion N/A 03/28/2012    Procedure: BI-VENTRICULAR PACEMAKER INSERTION (CRT-P);  Surgeon: Evans Lance, MD;  Location: [facility] CATH LAB;  Service: Cardiovascular;  Laterality: N/A;    Social History   Social History  . Marital Status: Married    Spouse Name: Alease Medina   . Number of Children: 0  . Years of Education: college   Occupational History  . PHYSICIAN     retired   Social History Main Topics  . Smoking status: Former Smoker -- 0.10 packs/day for 6 years    Types: Cigarettes  . Smokeless tobacco: Never Used     Comment: 03/28/2012 "smoked in the 1970's"  . Alcohol Use: 0.0 oz/week    0 Standard drinks or equivalent per week     Comment: wine rarely.  . Drug Use: No  . Sexual Activity: Not on file   Other Topics Concern  . 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 Lives at home with her husband.  Quit smoking 25 years ago and quit alcohol 2 years ago.   Denies alcohol, caffeine,  and illicit drug use.      ROS: no fevers or chills, productive cough, hemoptysis, dysphasia, odynophagia, melena, hematochezia, dysuria, hematuria, rash, seizure activity, orthopnea, PND, pedal edema, claudication. Remaining systems are negative.  Physical Exam: Well-developed well-nourished in no acute distress.  Skin is warm and dry.  HEENT is  normal.  Neck is supple.  Chest is clear to auscultation with normal expansion.  Cardiovascular exam is regular rate and rhythm. Crisp mechanical valve sound Abdominal exam nontender or distended. No masses palpated. Extremities show no edema. neuro grossly intact

## 2015-05-05 ENCOUNTER — Other Ambulatory Visit: Payer: Self-pay | Admitting: Cardiology

## 2015-05-06 ENCOUNTER — Telehealth: Payer: Self-pay | Admitting: Cardiology

## 2015-05-06 NOTE — Telephone Encounter (Signed)
Please call,concerning her medicine. Pt said it was denied at the pharmacy.

## 2015-05-07 MED ORDER — FUROSEMIDE 40 MG PO TABS: 40.0000 mg | ORAL_TABLET | ORAL | Status: DC

## 2015-05-07 MED ORDER — FUROSEMIDE 20 MG PO TABS: 20.0000 mg | ORAL_TABLET | ORAL | Status: DC

## 2015-05-07 NOTE — Telephone Encounter (Signed)
Spoke with pt, her lasix dose was changed by dr Lorrene Reid. New scripts sent to the pharmacy

## 2015-05-10 ENCOUNTER — Ambulatory Visit (INDEPENDENT_AMBULATORY_CARE_PROVIDER_SITE_OTHER): Payer: Medicare Other | Admitting: Cardiology

## 2015-05-10 ENCOUNTER — Encounter: Payer: Self-pay | Admitting: Cardiology

## 2015-05-10 ENCOUNTER — Ambulatory Visit (INDEPENDENT_AMBULATORY_CARE_PROVIDER_SITE_OTHER): Payer: Medicare Other | Admitting: *Deleted

## 2015-05-10 VITALS — BP 126/78 | HR 80 | Ht 60.0 in | Wt 154.7 lb

## 2015-05-10 DIAGNOSIS — I5042 Chronic combined systolic (congestive) and diastolic (congestive) heart failure: Secondary | ICD-10-CM

## 2015-05-10 DIAGNOSIS — Z950 Presence of cardiac pacemaker: Secondary | ICD-10-CM | POA: Diagnosis not present

## 2015-05-10 DIAGNOSIS — I4821 Permanent atrial fibrillation: Secondary | ICD-10-CM

## 2015-05-10 DIAGNOSIS — I495 Sick sinus syndrome: Secondary | ICD-10-CM

## 2015-05-10 DIAGNOSIS — I10 Essential (primary) hypertension: Secondary | ICD-10-CM | POA: Diagnosis not present

## 2015-05-10 DIAGNOSIS — I429 Cardiomyopathy, unspecified: Secondary | ICD-10-CM

## 2015-05-10 DIAGNOSIS — I482 Chronic atrial fibrillation: Secondary | ICD-10-CM

## 2015-05-10 DIAGNOSIS — Z9889 Other specified postprocedural states: Secondary | ICD-10-CM | POA: Diagnosis not present

## 2015-05-10 NOTE — Assessment & Plan Note (Signed)
Continue SBE prophylaxis. 

## 2015-05-10 NOTE — Assessment & Plan Note (Signed)
Continue beta blocker. No ACE inhibitor or ARB because of renal insufficiency. I will not add hydralazine/nitrates as she has had problems with hypotension previously. 

## 2015-05-10 NOTE — Assessment & Plan Note (Signed)
Patient appears to be euvolemic on examination. Continue present dose of Lasix. Renal function is monitored by nephrology.

## 2015-05-10 NOTE — Assessment & Plan Note (Signed)
Followed by electrophysiology. 

## 2015-05-10 NOTE — Assessment & Plan Note (Signed)
Blood pressure controlled. Continue present medications. 

## 2015-05-10 NOTE — Patient Instructions (Signed)
Your physician wants you to follow-up in: 6 MONTHS WITH DR CRENSHAW You will receive a reminder letter in the mail two months in advance. If you don't receive a letter, please call our office to schedule the follow-up appointment.  

## 2015-05-10 NOTE — Assessment & Plan Note (Signed)
Continue beta-blocker and anticoagulation. ?

## 2015-05-10 NOTE — Progress Notes (Signed)
Remote pacemaker transmission.   

## 2015-05-19 LAB — CUP PACEART REMOTE DEVICE CHECK
Battery Remaining Percentage: 81 %
Battery Voltage: 2.93 V
Implantable Lead Implant Date: 20131003
Implantable Lead Implant Date: 20131003
Implantable Lead Location: 753858
Implantable Lead Location: 753859
Lead Channel Impedance Value: 1075 Ohm
Lead Channel Impedance Value: 410 Ohm
Lead Channel Pacing Threshold Amplitude: 0.75 V
Lead Channel Setting Pacing Pulse Width: 0.5 ms
MDC IDC LEAD IMPLANT DT: 20131003
MDC IDC LEAD LOCATION: 753860
MDC IDC MSMT BATTERY REMAINING LONGEVITY: 80 mo
MDC IDC MSMT LEADCHNL LV PACING THRESHOLD PULSEWIDTH: 0.8 ms
MDC IDC MSMT LEADCHNL RV PACING THRESHOLD AMPLITUDE: 0.75 V
MDC IDC MSMT LEADCHNL RV PACING THRESHOLD PULSEWIDTH: 0.5 ms
MDC IDC MSMT LEADCHNL RV SENSING INTR AMPL: 8.4 mV
MDC IDC SESS DTM: 20161114070006
MDC IDC SET LEADCHNL LV PACING AMPLITUDE: 2 V
MDC IDC SET LEADCHNL LV PACING PULSEWIDTH: 0.8 ms
MDC IDC SET LEADCHNL RV PACING AMPLITUDE: 2 V
MDC IDC SET LEADCHNL RV SENSING SENSITIVITY: 2 mV
Pulse Gen Serial Number: 2786353

## 2015-05-24 ENCOUNTER — Other Ambulatory Visit (HOSPITAL_BASED_OUTPATIENT_CLINIC_OR_DEPARTMENT_OTHER): Payer: Medicare Other

## 2015-05-24 DIAGNOSIS — N183 Chronic kidney disease, stage 3 unspecified: Secondary | ICD-10-CM

## 2015-05-24 DIAGNOSIS — D6861 Antiphospholipid syndrome: Secondary | ICD-10-CM

## 2015-05-24 DIAGNOSIS — D599 Acquired hemolytic anemia, unspecified: Secondary | ICD-10-CM

## 2015-05-24 DIAGNOSIS — E039 Hypothyroidism, unspecified: Secondary | ICD-10-CM | POA: Diagnosis not present

## 2015-05-24 DIAGNOSIS — D696 Thrombocytopenia, unspecified: Secondary | ICD-10-CM

## 2015-05-24 DIAGNOSIS — E782 Mixed hyperlipidemia: Secondary | ICD-10-CM | POA: Diagnosis not present

## 2015-05-24 DIAGNOSIS — Z7901 Long term (current) use of anticoagulants: Secondary | ICD-10-CM

## 2015-05-24 DIAGNOSIS — D6862 Lupus anticoagulant syndrome: Secondary | ICD-10-CM | POA: Diagnosis not present

## 2015-05-24 LAB — COMPREHENSIVE METABOLIC PANEL (CC13)
ALBUMIN: 4.1 g/dL (ref 3.5–5.0)
ALK PHOS: 101 U/L (ref 40–150)
ALT: 19 U/L (ref 0–55)
ANION GAP: 11 meq/L (ref 3–11)
AST: 31 U/L (ref 5–34)
BILIRUBIN TOTAL: 0.65 mg/dL (ref 0.20–1.20)
BUN: 47.4 mg/dL — ABNORMAL HIGH (ref 7.0–26.0)
CALCIUM: 9.8 mg/dL (ref 8.4–10.4)
CO2: 28 mEq/L (ref 22–29)
Chloride: 98 mEq/L (ref 98–109)
Creatinine: 2 mg/dL — ABNORMAL HIGH (ref 0.6–1.1)
EGFR: 24 mL/min/{1.73_m2} — AB (ref >90)
Glucose: 88 mg/dl (ref 70–140)
POTASSIUM: 4.1 meq/L (ref 3.5–5.1)
SODIUM: 137 meq/L (ref 136–145)
TOTAL PROTEIN: 8 g/dL (ref 6.4–8.3)

## 2015-05-24 LAB — CBC WITH DIFFERENTIAL/PLATELET
BASO%: 0.9 % (ref 0.0–2.0)
BASOS ABS: 0.1 10*3/uL (ref 0.0–0.1)
EOS ABS: 0.2 10*3/uL (ref 0.0–0.5)
EOS%: 3.5 % (ref 0.0–7.0)
HEMATOCRIT: 37.5 % (ref 34.8–46.6)
HEMOGLOBIN: 12.7 g/dL (ref 11.6–15.9)
LYMPH%: 21.4 % (ref 14.0–49.7)
MCH: 31.6 pg (ref 25.1–34.0)
MCHC: 33.9 g/dL (ref 31.5–36.0)
MCV: 93.3 fL (ref 79.5–101.0)
MONO#: 0.4 10*3/uL (ref 0.1–0.9)
MONO%: 6.8 % (ref 0.0–14.0)
NEUT#: 3.7 10*3/uL (ref 1.5–6.5)
NEUT%: 67.4 % (ref 38.4–76.8)
PLATELETS: 68 10*3/uL — AB (ref 145–400)
RBC: 4.02 10*6/uL (ref 3.70–5.45)
RDW: 12.7 % (ref 11.2–14.5)
WBC: 5.4 10*3/uL (ref 3.9–10.3)
lymph#: 1.2 10*3/uL (ref 0.9–3.3)

## 2015-05-25 ENCOUNTER — Other Ambulatory Visit: Payer: Medicare Other

## 2015-05-25 DIAGNOSIS — E782 Mixed hyperlipidemia: Secondary | ICD-10-CM | POA: Diagnosis not present

## 2015-05-25 DIAGNOSIS — Z952 Presence of prosthetic heart valve: Secondary | ICD-10-CM | POA: Diagnosis not present

## 2015-05-25 DIAGNOSIS — Z800 Family history of malignant neoplasm of digestive organs: Secondary | ICD-10-CM | POA: Diagnosis not present

## 2015-05-25 DIAGNOSIS — Z1389 Encounter for screening for other disorder: Secondary | ICD-10-CM | POA: Diagnosis not present

## 2015-05-25 DIAGNOSIS — D649 Anemia, unspecified: Secondary | ICD-10-CM | POA: Diagnosis not present

## 2015-05-25 DIAGNOSIS — I10 Essential (primary) hypertension: Secondary | ICD-10-CM | POA: Diagnosis not present

## 2015-05-25 DIAGNOSIS — I050 Rheumatic mitral stenosis: Secondary | ICD-10-CM | POA: Diagnosis not present

## 2015-05-25 DIAGNOSIS — I080 Rheumatic disorders of both mitral and aortic valves: Secondary | ICD-10-CM | POA: Diagnosis not present

## 2015-05-25 DIAGNOSIS — I63349 Cerebral infarction due to thrombosis of unspecified cerebellar artery: Secondary | ICD-10-CM | POA: Diagnosis not present

## 2015-05-25 DIAGNOSIS — Z0001 Encounter for general adult medical examination with abnormal findings: Secondary | ICD-10-CM | POA: Diagnosis not present

## 2015-05-25 DIAGNOSIS — E039 Hypothyroidism, unspecified: Secondary | ICD-10-CM | POA: Diagnosis not present

## 2015-05-26 ENCOUNTER — Encounter: Payer: Self-pay | Admitting: Cardiology

## 2015-05-26 LAB — HEPARIN ANTI-XA: Heparin Anti-Xa: 1.01 IU/mL

## 2015-05-28 ENCOUNTER — Telehealth: Payer: Self-pay | Admitting: *Deleted

## 2015-05-28 NOTE — Telephone Encounter (Signed)
Pt  called - no answer; left message Arixtra level good (@ 1.01 ) and Creat stable @ 2.0 per Dr Beryle Beams. And to call if she has any questions.

## 2015-05-28 NOTE — Telephone Encounter (Signed)
-----   Message from Annia Belt, MD sent at 05/27/2015  5:58 PM EST ----- Call pt  arixtra level good  Creatinine stable @ 2.0 Fax cc lab to Dr Gilman Schmidt; Dr Mertha Finders

## 2015-05-31 DIAGNOSIS — Z124 Encounter for screening for malignant neoplasm of cervix: Secondary | ICD-10-CM | POA: Diagnosis not present

## 2015-06-03 ENCOUNTER — Telehealth: Payer: Self-pay | Admitting: *Deleted

## 2015-06-03 NOTE — Telephone Encounter (Signed)
It looks like she gets a CBC, BMP, and anti Xa levels every two months, last 11/28 were ok. So she should not need repeat labs until late January. I think it should be ok to cancel the labs for 12/12.

## 2015-06-03 NOTE — Telephone Encounter (Signed)
Call from pt - states she has a lab appt on Monday the 12th; and she already had labs done . Does she need to keep this appt? Thanks

## 2015-06-03 NOTE — Telephone Encounter (Signed)
Left message - it's ok to cancel labs on 12/12 and repeat labs late Jan per Dr Evette Doffing.  Pt has lab appt 08/02/15 and appt w/Dr on 08/09/15 per [facility].

## 2015-06-07 ENCOUNTER — Other Ambulatory Visit: Payer: Medicare Other

## 2015-06-10 DIAGNOSIS — L821 Other seborrheic keratosis: Secondary | ICD-10-CM | POA: Diagnosis not present

## 2015-06-10 DIAGNOSIS — Z85828 Personal history of other malignant neoplasm of skin: Secondary | ICD-10-CM | POA: Diagnosis not present

## 2015-06-10 DIAGNOSIS — L570 Actinic keratosis: Secondary | ICD-10-CM | POA: Diagnosis not present

## 2015-07-13 DIAGNOSIS — H53002 Unspecified amblyopia, left eye: Secondary | ICD-10-CM | POA: Diagnosis not present

## 2015-07-13 DIAGNOSIS — H2513 Age-related nuclear cataract, bilateral: Secondary | ICD-10-CM | POA: Diagnosis not present

## 2015-07-13 DIAGNOSIS — H5203 Hypermetropia, bilateral: Secondary | ICD-10-CM | POA: Diagnosis not present

## 2015-07-13 DIAGNOSIS — H0012 Chalazion right lower eyelid: Secondary | ICD-10-CM | POA: Diagnosis not present

## 2015-07-15 DIAGNOSIS — I4892 Unspecified atrial flutter: Secondary | ICD-10-CM | POA: Diagnosis not present

## 2015-07-15 DIAGNOSIS — I10 Essential (primary) hypertension: Secondary | ICD-10-CM | POA: Diagnosis not present

## 2015-07-15 DIAGNOSIS — I519 Heart disease, unspecified: Secondary | ICD-10-CM | POA: Diagnosis not present

## 2015-07-15 DIAGNOSIS — N189 Chronic kidney disease, unspecified: Secondary | ICD-10-CM | POA: Diagnosis not present

## 2015-07-15 DIAGNOSIS — I4891 Unspecified atrial fibrillation: Secondary | ICD-10-CM | POA: Diagnosis not present

## 2015-07-15 DIAGNOSIS — E039 Hypothyroidism, unspecified: Secondary | ICD-10-CM | POA: Diagnosis not present

## 2015-07-15 DIAGNOSIS — N2581 Secondary hyperparathyroidism of renal origin: Secondary | ICD-10-CM | POA: Diagnosis not present

## 2015-07-15 DIAGNOSIS — Z7901 Long term (current) use of anticoagulants: Secondary | ICD-10-CM | POA: Diagnosis not present

## 2015-07-15 DIAGNOSIS — D631 Anemia in chronic kidney disease: Secondary | ICD-10-CM | POA: Diagnosis not present

## 2015-08-02 ENCOUNTER — Other Ambulatory Visit (HOSPITAL_BASED_OUTPATIENT_CLINIC_OR_DEPARTMENT_OTHER): Payer: Medicare Other

## 2015-08-02 DIAGNOSIS — D696 Thrombocytopenia, unspecified: Secondary | ICD-10-CM | POA: Diagnosis not present

## 2015-08-02 DIAGNOSIS — Z7901 Long term (current) use of anticoagulants: Secondary | ICD-10-CM

## 2015-08-02 DIAGNOSIS — N183 Chronic kidney disease, stage 3 unspecified: Secondary | ICD-10-CM

## 2015-08-02 DIAGNOSIS — D6862 Lupus anticoagulant syndrome: Secondary | ICD-10-CM | POA: Diagnosis not present

## 2015-08-02 DIAGNOSIS — D6861 Antiphospholipid syndrome: Secondary | ICD-10-CM

## 2015-08-02 DIAGNOSIS — D599 Acquired hemolytic anemia, unspecified: Secondary | ICD-10-CM | POA: Diagnosis not present

## 2015-08-02 LAB — COMPREHENSIVE METABOLIC PANEL
ALT: 31 U/L (ref 0–55)
ANION GAP: 11 meq/L (ref 3–11)
AST: 36 U/L — ABNORMAL HIGH (ref 5–34)
Albumin: 3.7 g/dL (ref 3.5–5.0)
Alkaline Phosphatase: 103 U/L (ref 40–150)
BILIRUBIN TOTAL: 0.6 mg/dL (ref 0.20–1.20)
BUN: 62.9 mg/dL — ABNORMAL HIGH (ref 7.0–26.0)
CALCIUM: 9.1 mg/dL (ref 8.4–10.4)
CO2: 25 meq/L (ref 22–29)
CREATININE: 1.9 mg/dL — AB (ref 0.6–1.1)
Chloride: 105 mEq/L (ref 98–109)
EGFR: 26 mL/min/{1.73_m2} — ABNORMAL LOW (ref >90)
Glucose: 89 mg/dl (ref 70–140)
Potassium: 4.4 mEq/L (ref 3.5–5.1)
Sodium: 142 mEq/L (ref 136–145)
TOTAL PROTEIN: 7 g/dL (ref 6.4–8.3)

## 2015-08-02 LAB — CBC WITH DIFFERENTIAL/PLATELET
BASO%: 1.4 % (ref 0.0–2.0)
Basophils Absolute: 0.1 10*3/uL (ref 0.0–0.1)
EOS%: 4.2 % (ref 0.0–7.0)
Eosinophils Absolute: 0.3 10*3/uL (ref 0.0–0.5)
HEMATOCRIT: 33.2 % — AB (ref 34.8–46.6)
HEMOGLOBIN: 10.7 g/dL — AB (ref 11.6–15.9)
LYMPH#: 0.9 10*3/uL (ref 0.9–3.3)
LYMPH%: 13.9 % — ABNORMAL LOW (ref 14.0–49.7)
MCH: 30.2 pg (ref 25.1–34.0)
MCHC: 32.2 g/dL (ref 31.5–36.0)
MCV: 93.8 fL (ref 79.5–101.0)
MONO#: 0.4 10*3/uL (ref 0.1–0.9)
MONO%: 5.9 % (ref 0.0–14.0)
NEUT%: 74.6 % (ref 38.4–76.8)
NEUTROS ABS: 4.8 10*3/uL (ref 1.5–6.5)
PLATELETS: 66 10*3/uL — AB (ref 145–400)
RBC: 3.54 10*6/uL — ABNORMAL LOW (ref 3.70–5.45)
RDW: 12.7 % (ref 11.2–14.5)
WBC: 6.4 10*3/uL (ref 3.9–10.3)

## 2015-08-03 LAB — HEPARIN ANTI-XA: Heparin Anti-Xa: 0.8 IU/mL

## 2015-08-05 ENCOUNTER — Telehealth: Payer: Self-pay | Admitting: *Deleted

## 2015-08-05 NOTE — Telephone Encounter (Signed)
-----   Message from Annia Belt, MD sent at 08/03/2015  4:47 PM EST ----- Call Dr Kathyrn Drown: lab stable: BUN 63, creatinine 1.9. Arixtra 0.8 which is acceptable.

## 2015-08-05 NOTE — Telephone Encounter (Signed)
Pt called - no answer; left message labs are stable; BUN 63, Crea 1.9 and Arixtra 0.8 which is acceptable per Dr Beryle Beams. And to call if she has any questions.

## 2015-08-09 ENCOUNTER — Ambulatory Visit (INDEPENDENT_AMBULATORY_CARE_PROVIDER_SITE_OTHER): Payer: Medicare Other | Admitting: *Deleted

## 2015-08-09 ENCOUNTER — Ambulatory Visit (INDEPENDENT_AMBULATORY_CARE_PROVIDER_SITE_OTHER): Payer: Medicare Other | Admitting: Oncology

## 2015-08-09 ENCOUNTER — Encounter: Payer: Self-pay | Admitting: Oncology

## 2015-08-09 VITALS — BP 116/74 | HR 80 | Temp 97.6°F | Ht 63.0 in | Wt 158.3 lb

## 2015-08-09 DIAGNOSIS — Z7901 Long term (current) use of anticoagulants: Secondary | ICD-10-CM

## 2015-08-09 DIAGNOSIS — I129 Hypertensive chronic kidney disease with stage 1 through stage 4 chronic kidney disease, or unspecified chronic kidney disease: Secondary | ICD-10-CM | POA: Diagnosis not present

## 2015-08-09 DIAGNOSIS — Z862 Personal history of diseases of the blood and blood-forming organs and certain disorders involving the immune mechanism: Secondary | ICD-10-CM

## 2015-08-09 DIAGNOSIS — Z950 Presence of cardiac pacemaker: Secondary | ICD-10-CM | POA: Diagnosis not present

## 2015-08-09 DIAGNOSIS — I495 Sick sinus syndrome: Secondary | ICD-10-CM | POA: Diagnosis not present

## 2015-08-09 DIAGNOSIS — D696 Thrombocytopenia, unspecified: Secondary | ICD-10-CM

## 2015-08-09 DIAGNOSIS — I4891 Unspecified atrial fibrillation: Secondary | ICD-10-CM

## 2015-08-09 DIAGNOSIS — D6861 Antiphospholipid syndrome: Secondary | ICD-10-CM | POA: Diagnosis not present

## 2015-08-09 DIAGNOSIS — N189 Chronic kidney disease, unspecified: Secondary | ICD-10-CM | POA: Diagnosis not present

## 2015-08-09 DIAGNOSIS — E039 Hypothyroidism, unspecified: Secondary | ICD-10-CM

## 2015-08-09 DIAGNOSIS — Z952 Presence of prosthetic heart valve: Secondary | ICD-10-CM

## 2015-08-09 DIAGNOSIS — N184 Chronic kidney disease, stage 4 (severe): Secondary | ICD-10-CM

## 2015-08-09 DIAGNOSIS — Z8679 Personal history of other diseases of the circulatory system: Secondary | ICD-10-CM | POA: Diagnosis not present

## 2015-08-09 NOTE — Progress Notes (Signed)
Patient ID: [name], female   DOB: 02-06-1946, 70 y.o.   MRN: [id number] Hematology and Oncology Follow Up Visit  [name] [id number] 03-22-1946 70 y.o. 08/09/2015 11:07 AM   Principle Diagnosis: Encounter Diagnoses  Name Primary?  . Thrombocytopenia ([place]) Yes  . Antiphospholipid antibody with hypercoagulable state ([place])   . Chronic anticoagulation   . 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 She has had recurrent thrombotic events including an episode of amaurosis fugax and a TIA in the past. She has chronic moderate thrombocytopenia. She had a initial mitral valvuloplasty at [facility]. She developed advanced mitral valve disease and had to undergo an emergency mitral valve replacement at [facility] when she went into acute congestive heart failure with a concomitant DIC picture. She had a mechanical mitral valve replacement. 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 She developed chronic renal insufficiency.  She suffered a subdural hematoma after minor head trauma while on coumadin requiring emergency evacuation. Attempts to go back on Coumadin resulted in a recurrent subdural. She had an anaphylactic reaction to vitamin K in attempt to reverse the Coumadin at time of the second intracranial bleed. 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  She has been maintained on Arixtra injections 2.5 mg every other day with a single 5 mg dose weekly despite her chronic renal dysfunction in view of the Coumadin related hemorrhages and the history of heparin-induced thrombocytopenia  .  Despite the mitral valve replacement, she has had refractory atrial arrhythmias. She has required DC  cardioversion and a number of attempts at radiofrequency ablation. She had to have a pacemaker placed. She is still in atrial fibrillation.    Interim History:  Overall stable since last visit with me in October 2016. She has had 2 isolated episodes lasting a few seconds where she felt lightheaded but did not have any palpitations or chest pressure. She continues to exercise on a regular basis. She feels better since adjustments of her diuretics now taking 40 mg of Lasix alternating with 20 mg starting in July when her BUN went up to 82 with creatinine 2.5. Most recent renal function on 08/02/2015 with  BUN 63, creatinine 1.9. Creatinine has stabilized at 2.0+ or -0.2. She denies any headaches or change in vision. No clinical bleeding.  Medications: reviewed  Allergies:  Allergies  Allergen Reactions  . Amiodarone Hcl Other (See Comments)    "torsades; v tach"  . 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 Vitamin K Anaphylaxis    IV only allergy  . Iodinated Diagnostic Agents Itching    "over 35 years ago" (03/28/2012)    Review of Systems: See HPI Remaining ROS negative:   Physical Exam: Blood pressure 116/74, pulse 80, temperature 97.6 F (36.4 C), temperature source Oral, height 5\' 3"  (1.6 m), weight 158 lb 4.8 oz (71.804 kg), SpO2 100 %. Wt Readings from Last 3 Encounters:  08/09/15 158 lb 4.8 oz (71.804 kg)  05/10/15 154 lb 11.2 oz (70.171 kg)  03/29/15 152 lb 6.4 oz (69.128 kg)     General appearance: well nourished Caucasian woman HENNT: Pharynx no erythema, exudate, mass, or ulcer. No thyromegaly or thyroid nodules Lymph nodes: No cervical, supraclavicular, or axillary lymphadenopathy Breasts:  Lungs: Clear to auscultation, resonant  to percussion throughout Heart: Regular rhythm, no murmur, no gallop, no rub, no click, no edema. Pacemaker battery SQ tissues left chest Abdomen: Soft, nontender, normal bowel sounds, no mass, no organomegaly Extremities: No edema, no calf  tenderness Musculoskeletal: no joint deformities GU:  Vascular: Carotid pulses 2+, no bruits,  Neurologic: Alert, oriented, PERRLA, optic discs chronically blurred,  vessels normal, no hemorrhage or exudate, cranial nerves grossly normal, motor strength 5 over 5, reflexes 1+ symmetric, upper body coordination normal, gait normal, flexion contracture and poor coordination, left hand, chronic Skin: No rash or ecchymosis  Lab Results: CBC W/Diff    Component Value Date/Time   WBC 6.4 08/02/2015 1315   WBC 5.0 08/04/2014 1150   RBC 3.54* 08/02/2015 1315   RBC 4.02 08/04/2014 1150   RBC 4.02 08/04/2014 1150   HGB 10.7* 08/02/2015 1315   HGB 12.0 08/04/2014 1150   HCT 33.2* 08/02/2015 1315   HCT 35.8* 08/04/2014 1150   PLT 66* 08/02/2015 1315   PLT 97* 08/04/2014 1150   MCV 93.8 08/02/2015 1315   MCV 89.1 08/04/2014 1150   MCH 30.2 08/02/2015 1315   MCH 29.9 08/04/2014 1150   MCHC 32.2 08/02/2015 1315   MCHC 33.5 08/04/2014 1150   RDW 12.7 08/02/2015 1315   RDW 13.9 08/04/2014 1150   LYMPHSABS 0.9 08/02/2015 1315   LYMPHSABS 0.9 08/04/2014 1150   MONOABS 0.4 08/02/2015 1315   MONOABS 0.4 08/04/2014 1150   EOSABS 0.3 08/02/2015 1315   EOSABS 0.2 08/04/2014 1150   BASOSABS 0.1 08/02/2015 1315   BASOSABS 0.1 08/04/2014 1150     Chemistry      Component Value Date/Time   NA 142 08/02/2015 1315   NA 139 08/04/2014 1150   K 4.4 08/02/2015 1315   K 4.2 08/04/2014 1150   CL 101 08/04/2014 1150   CL 108* 11/04/2012 1158   CO2 25 08/02/2015 1315   CO2 30 08/04/2014 1150   BUN 62.9* 08/02/2015 1315   BUN 58* 08/04/2014 1150   CREATININE 1.9* 08/02/2015 1315   CREATININE 1.86* 08/04/2014 1150   CREATININE 1.92* 06/02/2014 1501      Component Value Date/Time   CALCIUM 9.1 08/02/2015 1315   CALCIUM 9.2 08/04/2014 1150   ALKPHOS 103 08/02/2015 1315   ALKPHOS 125* 08/04/2014 1150   AST 36* 08/02/2015 1315   AST 31 08/04/2014 1150   ALT 31 08/02/2015 1315   ALT 34 08/04/2014  1150   BILITOT 0.60 08/02/2015 1315   BILITOT 0.6 08/04/2014 1150       Radiological Studies: No results found.  Impression:  #1. Antiphospholipid antibody syndrome status post multiple thrombotic events  She remains stable on Arixtra anticoagulation.  Plan: Continue long-term anticoagulation.  Target Arixtra level 0.8-1.1. Recently dose changed back to 2.5 mg every other day Most recent level done on 08/03/2015 was 0.8.   #2. Chronic mild thrombocytopenia secondary to #1  Platelet count fluctuates lowest value in the last year 66,000, average 70-80,000.  #3. Chronic renal insufficiency likely due to perioperative complications at time of mitral valve replacement in 2007.  Previous baseline creatinine 1.5-1.8. Over the last 12 months slow deterioration with new baseline 1.8-2.3. She is followed closely by nephrology, Dr. Lorrene Reid.  #4. Mitral valve disease likely secondary to #1. Status post initial valvuloplasty then emergency mechanical mitral valve replacement 2007   #5. Atrial fibrillation/flutter- now status post pacemaker implantation having failed DC cardioversion and a radiofrequency ablation procedure.   #6. Essential hypertension  Blood pressure  well controlled on current regimen  #7. Hypothyroid on replacement  #8. 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 Coumadin permanently discontinued.  #10. History of anaphylactic reaction to IV vitamin K.  CC: Patient Care Team: Josetta Huddle, MD as PCP - General (Internal Medicine) Annia Belt, MD as Consulting Physician (Oncology) Jamal Maes, MD as Consulting Physician (Nephrology) Lelon Perla, MD as Consulting Physician (Cardiology) Thompson Grayer, MD as Consulting Physician (Cardiology)   Annia Belt, MD 2/13/201711:07 AM

## 2015-08-09 NOTE — Progress Notes (Signed)
Remote pacemaker transmission.   

## 2015-08-09 NOTE — Patient Instructions (Signed)
Lab at [place] [facility] every month next 08/30/15 CBC, CMet, peak low molecular weight heparin level MD vist 4-5 months

## 2015-08-11 ENCOUNTER — Encounter: Payer: Self-pay | Admitting: Cardiology

## 2015-08-11 LAB — CUP PACEART REMOTE DEVICE CHECK
Date Time Interrogation Session: 20170213071735
Implantable Lead Implant Date: 20131003
Implantable Lead Implant Date: 20131003
Implantable Lead Location: 753860
Lead Channel Impedance Value: 1200 Ohm
Lead Channel Impedance Value: 430 Ohm
Lead Channel Pacing Threshold Amplitude: 0.75 V
Lead Channel Pacing Threshold Pulse Width: 0.5 ms
Lead Channel Sensing Intrinsic Amplitude: 7.5 mV
Lead Channel Setting Pacing Amplitude: 2 V
Lead Channel Setting Pacing Pulse Width: 0.5 ms
Lead Channel Setting Pacing Pulse Width: 0.8 ms
MDC IDC LEAD IMPLANT DT: 20131003
MDC IDC LEAD LOCATION: 753858
MDC IDC LEAD LOCATION: 753859
MDC IDC MSMT BATTERY REMAINING LONGEVITY: 82 mo
MDC IDC MSMT BATTERY REMAINING PERCENTAGE: 81 %
MDC IDC MSMT BATTERY VOLTAGE: 2.93 V
MDC IDC MSMT LEADCHNL LV PACING THRESHOLD AMPLITUDE: 0.625 V
MDC IDC MSMT LEADCHNL LV PACING THRESHOLD PULSEWIDTH: 0.8 ms
MDC IDC PG SERIAL: 2786353
MDC IDC SET LEADCHNL RV PACING AMPLITUDE: 2 V
MDC IDC SET LEADCHNL RV SENSING SENSITIVITY: 2 mV

## 2015-08-30 ENCOUNTER — Other Ambulatory Visit (INDEPENDENT_AMBULATORY_CARE_PROVIDER_SITE_OTHER): Payer: Medicare Other

## 2015-08-30 DIAGNOSIS — N184 Chronic kidney disease, stage 4 (severe): Secondary | ICD-10-CM

## 2015-08-30 DIAGNOSIS — D696 Thrombocytopenia, unspecified: Secondary | ICD-10-CM

## 2015-08-30 DIAGNOSIS — Z7901 Long term (current) use of anticoagulants: Secondary | ICD-10-CM

## 2015-08-30 DIAGNOSIS — D6861 Antiphospholipid syndrome: Secondary | ICD-10-CM | POA: Diagnosis not present

## 2015-08-31 LAB — CBC WITH DIFFERENTIAL/PLATELET
BASOS ABS: 0.1 10*3/uL (ref 0.0–0.2)
BASOS: 1 %
EOS (ABSOLUTE): 0.2 10*3/uL (ref 0.0–0.4)
Eos: 4 %
HEMATOCRIT: 33.7 % — AB (ref 34.0–46.6)
Hemoglobin: 11.2 g/dL (ref 11.1–15.9)
IMMATURE GRANS (ABS): 0 10*3/uL (ref 0.0–0.1)
Immature Granulocytes: 0 %
LYMPHS ABS: 1.1 10*3/uL (ref 0.7–3.1)
LYMPHS: 20 %
MCH: 30.8 pg (ref 26.6–33.0)
MCHC: 33.2 g/dL (ref 31.5–35.7)
MCV: 93 fL (ref 79–97)
MONOCYTES: 6 %
Monocytes Absolute: 0.4 10*3/uL (ref 0.1–0.9)
NEUTROS ABS: 3.9 10*3/uL (ref 1.4–7.0)
Neutrophils: 69 %
PLATELETS: 64 10*3/uL — AB (ref 150–379)
RBC: 3.64 x10E6/uL — ABNORMAL LOW (ref 3.77–5.28)
RDW: 13 % (ref 12.3–15.4)
WBC: 5.6 10*3/uL (ref 3.4–10.8)

## 2015-08-31 LAB — COMPREHENSIVE METABOLIC PANEL
A/G RATIO: 1.5 (ref 1.1–2.5)
ALBUMIN: 4.1 g/dL (ref 3.6–4.8)
ALT: 23 IU/L (ref 0–32)
AST: 33 IU/L (ref 0–40)
Alkaline Phosphatase: 106 IU/L (ref 39–117)
BILIRUBIN TOTAL: 0.4 mg/dL (ref 0.0–1.2)
BUN / CREAT RATIO: 39 — AB (ref 11–26)
BUN: 67 mg/dL — ABNORMAL HIGH (ref 8–27)
CALCIUM: 9 mg/dL (ref 8.7–10.3)
CHLORIDE: 101 mmol/L (ref 96–106)
CO2: 23 mmol/L (ref 18–29)
Creatinine, Ser: 1.73 mg/dL — ABNORMAL HIGH (ref 0.57–1.00)
GFR, EST AFRICAN AMERICAN: 34 mL/min/{1.73_m2} — AB (ref >59)
GFR, EST NON AFRICAN AMERICAN: 30 mL/min/{1.73_m2} — AB (ref >59)
GLOBULIN, TOTAL: 2.7 g/dL (ref 1.5–4.5)
Glucose: 83 mg/dL (ref 65–99)
POTASSIUM: 4.4 mmol/L (ref 3.5–5.2)
SODIUM: 142 mmol/L (ref 134–144)
TOTAL PROTEIN: 6.8 g/dL (ref 6.0–8.5)

## 2015-08-31 LAB — HEPARIN ANTI-XA: Heparin Anti-Xa: 0.91 IU/mL

## 2015-09-02 ENCOUNTER — Telehealth: Payer: Self-pay | Admitting: *Deleted

## 2015-09-02 NOTE — Telephone Encounter (Signed)
Pt called - no answer; left message "labs all stable, Creatinine 1.7; Arixtra level good" per Dr Beryle Beams.  And to call if she has any questions.

## 2015-09-02 NOTE — Telephone Encounter (Signed)
-----   Message from Annia Belt, MD sent at 08/31/2015  1:03 PM EST ----- Call Dr Pam Drown: lab all stable. Creatinine 1.7; arixtra level good

## 2015-09-27 ENCOUNTER — Other Ambulatory Visit: Payer: Medicare Other

## 2015-09-27 ENCOUNTER — Other Ambulatory Visit (INDEPENDENT_AMBULATORY_CARE_PROVIDER_SITE_OTHER): Payer: Medicare Other

## 2015-09-27 DIAGNOSIS — D6861 Antiphospholipid syndrome: Secondary | ICD-10-CM | POA: Diagnosis not present

## 2015-09-27 DIAGNOSIS — D696 Thrombocytopenia, unspecified: Secondary | ICD-10-CM | POA: Diagnosis not present

## 2015-09-27 DIAGNOSIS — E039 Hypothyroidism, unspecified: Secondary | ICD-10-CM | POA: Diagnosis not present

## 2015-09-27 DIAGNOSIS — N189 Chronic kidney disease, unspecified: Secondary | ICD-10-CM | POA: Diagnosis not present

## 2015-09-27 DIAGNOSIS — I10 Essential (primary) hypertension: Secondary | ICD-10-CM | POA: Diagnosis not present

## 2015-09-27 DIAGNOSIS — D631 Anemia in chronic kidney disease: Secondary | ICD-10-CM | POA: Diagnosis not present

## 2015-09-27 DIAGNOSIS — I519 Heart disease, unspecified: Secondary | ICD-10-CM | POA: Diagnosis not present

## 2015-09-27 DIAGNOSIS — N184 Chronic kidney disease, stage 4 (severe): Secondary | ICD-10-CM | POA: Diagnosis not present

## 2015-09-27 DIAGNOSIS — Z7901 Long term (current) use of anticoagulants: Secondary | ICD-10-CM

## 2015-09-27 DIAGNOSIS — I4891 Unspecified atrial fibrillation: Secondary | ICD-10-CM | POA: Diagnosis not present

## 2015-09-27 DIAGNOSIS — N2581 Secondary hyperparathyroidism of renal origin: Secondary | ICD-10-CM | POA: Diagnosis not present

## 2015-09-27 DIAGNOSIS — I4892 Unspecified atrial flutter: Secondary | ICD-10-CM | POA: Diagnosis not present

## 2015-09-28 LAB — COMPREHENSIVE METABOLIC PANEL
A/G RATIO: 1.6 (ref 1.2–2.2)
ALBUMIN: 4.1 g/dL (ref 3.6–4.8)
ALT: 15 IU/L (ref 0–32)
AST: 23 IU/L (ref 0–40)
Alkaline Phosphatase: 88 IU/L (ref 39–117)
BUN / CREAT RATIO: 32 — AB (ref 12–28)
BUN: 60 mg/dL — ABNORMAL HIGH (ref 8–27)
Bilirubin Total: 0.4 mg/dL (ref 0.0–1.2)
CALCIUM: 8.6 mg/dL — AB (ref 8.7–10.3)
CO2: 21 mmol/L (ref 18–29)
Chloride: 99 mmol/L (ref 96–106)
Creatinine, Ser: 1.87 mg/dL — ABNORMAL HIGH (ref 0.57–1.00)
GFR, EST AFRICAN AMERICAN: 31 mL/min/{1.73_m2} — AB (ref >59)
GFR, EST NON AFRICAN AMERICAN: 27 mL/min/{1.73_m2} — AB (ref >59)
GLOBULIN, TOTAL: 2.5 g/dL (ref 1.5–4.5)
Glucose: 87 mg/dL (ref 65–99)
POTASSIUM: 4.2 mmol/L (ref 3.5–5.2)
SODIUM: 139 mmol/L (ref 134–144)
Total Protein: 6.6 g/dL (ref 6.0–8.5)

## 2015-09-28 LAB — CBC WITH DIFFERENTIAL/PLATELET
BASOS: 2 %
Basophils Absolute: 0.1 10*3/uL (ref 0.0–0.2)
EOS (ABSOLUTE): 0.2 10*3/uL (ref 0.0–0.4)
Eos: 5 %
HEMATOCRIT: 33.8 % — AB (ref 34.0–46.6)
Hemoglobin: 11.4 g/dL (ref 11.1–15.9)
IMMATURE GRANS (ABS): 0 10*3/uL (ref 0.0–0.1)
Immature Granulocytes: 0 %
LYMPHS: 28 %
Lymphocytes Absolute: 1.2 10*3/uL (ref 0.7–3.1)
MCH: 30.6 pg (ref 26.6–33.0)
MCHC: 33.7 g/dL (ref 31.5–35.7)
MCV: 91 fL (ref 79–97)
MONOS ABS: 0.3 10*3/uL (ref 0.1–0.9)
Monocytes: 7 %
NEUTROS ABS: 2.4 10*3/uL (ref 1.4–7.0)
Neutrophils: 58 %
Platelets: 64 10*3/uL — CL (ref 150–379)
RBC: 3.73 x10E6/uL — ABNORMAL LOW (ref 3.77–5.28)
RDW: 13.1 % (ref 12.3–15.4)
WBC: 4.2 10*3/uL (ref 3.4–10.8)

## 2015-09-28 LAB — HEPARIN ANTI-XA: Heparin Anti-Xa: 0.79 IU/mL

## 2015-10-01 ENCOUNTER — Other Ambulatory Visit: Payer: Medicare Other

## 2015-10-01 ENCOUNTER — Telehealth: Payer: Self-pay | Admitting: *Deleted

## 2015-10-01 NOTE — Telephone Encounter (Signed)
Pt called - no answer; left message "arixtra level OK, stay on same dose; creatinine 1.9, BUN 60 = her approximate baseline; I will forward results to Dr Lorrene Reid" per Dr Beryle Beams. And to call if she has any questions.

## 2015-10-25 ENCOUNTER — Other Ambulatory Visit (INDEPENDENT_AMBULATORY_CARE_PROVIDER_SITE_OTHER): Payer: Medicare Other

## 2015-10-25 DIAGNOSIS — N184 Chronic kidney disease, stage 4 (severe): Secondary | ICD-10-CM | POA: Diagnosis not present

## 2015-10-25 DIAGNOSIS — D696 Thrombocytopenia, unspecified: Secondary | ICD-10-CM

## 2015-10-25 DIAGNOSIS — D6861 Antiphospholipid syndrome: Secondary | ICD-10-CM

## 2015-10-25 DIAGNOSIS — Z7901 Long term (current) use of anticoagulants: Secondary | ICD-10-CM | POA: Diagnosis not present

## 2015-10-26 ENCOUNTER — Telehealth: Payer: Self-pay | Admitting: *Deleted

## 2015-10-26 LAB — COMPREHENSIVE METABOLIC PANEL
ALT: 31 IU/L (ref 0–32)
AST: 43 IU/L — ABNORMAL HIGH (ref 0–40)
Albumin/Globulin Ratio: 1.6 (ref 1.2–2.2)
Albumin: 4.3 g/dL (ref 3.6–4.8)
Alkaline Phosphatase: 116 IU/L (ref 39–117)
BUN/Creatinine Ratio: 31 — ABNORMAL HIGH (ref 12–28)
BUN: 64 mg/dL — AB (ref 8–27)
Bilirubin Total: 0.4 mg/dL (ref 0.0–1.2)
CALCIUM: 9.1 mg/dL (ref 8.7–10.3)
CHLORIDE: 96 mmol/L (ref 96–106)
CO2: 22 mmol/L (ref 18–29)
Creatinine, Ser: 2.05 mg/dL — ABNORMAL HIGH (ref 0.57–1.00)
GFR, EST AFRICAN AMERICAN: 28 mL/min/{1.73_m2} — AB (ref >59)
GFR, EST NON AFRICAN AMERICAN: 24 mL/min/{1.73_m2} — AB (ref >59)
GLUCOSE: 93 mg/dL (ref 65–99)
Globulin, Total: 2.7 g/dL (ref 1.5–4.5)
Potassium: 4.8 mmol/L (ref 3.5–5.2)
Sodium: 138 mmol/L (ref 134–144)
TOTAL PROTEIN: 7 g/dL (ref 6.0–8.5)

## 2015-10-26 LAB — CBC WITH DIFFERENTIAL/PLATELET
BASOS ABS: 0 10*3/uL (ref 0.0–0.2)
Basos: 1 %
EOS (ABSOLUTE): 0.2 10*3/uL (ref 0.0–0.4)
Eos: 4 %
Hematocrit: 34.6 % (ref 34.0–46.6)
Hemoglobin: 11.6 g/dL (ref 11.1–15.9)
IMMATURE GRANS (ABS): 0 10*3/uL (ref 0.0–0.1)
Immature Granulocytes: 0 %
LYMPHS: 19 %
Lymphocytes Absolute: 1.1 10*3/uL (ref 0.7–3.1)
MCH: 30.4 pg (ref 26.6–33.0)
MCHC: 33.5 g/dL (ref 31.5–35.7)
MCV: 91 fL (ref 79–97)
MONOCYTES: 7 %
Monocytes Absolute: 0.4 10*3/uL (ref 0.1–0.9)
NEUTROS ABS: 3.9 10*3/uL (ref 1.4–7.0)
Neutrophils: 69 %
PLATELETS: 78 10*3/uL — AB (ref 150–379)
RBC: 3.82 x10E6/uL (ref 3.77–5.28)
RDW: 13.3 % (ref 12.3–15.4)
WBC: 5.6 10*3/uL (ref 3.4–10.8)

## 2015-10-26 LAB — HEPARIN ANTI-XA: Heparin Anti-Xa: 1 IU/mL

## 2015-10-26 NOTE — Telephone Encounter (Signed)
Pt called / informed "Creatinine slightly higher than last time @ 2.05; was 1.9 (Dr Darnell Level) will forward to Dr Lorrene Reid .Lovenox level OK on current dose. CBC stable" per Dr Beryle Beams.

## 2015-10-26 NOTE — Telephone Encounter (Signed)
-----   Message from Cassandra Belt, MD sent at 10/26/2015 10:41 AM EDT ----- Call pt: creatinine slightly higher than last time @ 2.05, was 1.9.; I will forward to Dr Lorrene Reid Lovenox level OK on current dose. CBC stable

## 2015-11-01 NOTE — Progress Notes (Signed)
HPI: FU rheumatic heart disease, atrial tachycardia/flutter, and combined systolic/diastolic congestive heart failure. The patient had mitral valvuloplasty at [facility] in the late 90s. In 2007 she had a mechanical mitral valve replacement at [facility]. 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 She's had a prior subdural hematoma that required evacuation. She also has a history of heparin-induced thrombocytopenia. She is followed by Dr Rayann Heman for SVT and is s/p ablation at [facility]; also with h/o torsades with amiodarone. Has had previous biventricular pacemaker. Nuclear study October 2015 showed an ejection fraction of 35%. There was a fixed anterior/apical defect consistent with scar. No ischemia. Echo 6/16 showed EF 35-40, trace AI, mechanical MV, moderate to severe LAE, mildly reduced RV function. Patient is very volume sensitive. Since last seen, She denies dyspnea, chest pain, palpitations or syncope. She had 2 brief dizzy spells not associated with palpitations. She was concerned because she had similar symptoms when she had torsades in the past with amiodarone. However these were much shorter lived. There were seconds.  Current Outpatient Prescriptions  Medication Sig Dispense Refill  . acetaminophen (TYLENOL) 650 MG CR tablet Take 650 mg by mouth daily as needed for pain.     Marland Kitchen AMOXICILLIN PO Take 4 capsules by mouth as directed. ONLY WHEN VISITING DENTIST    . Calcium-Magnesium (CAL-MAG PO) Take 1 tablet by mouth daily.     . Cholecalciferol (VITAMIN D) 2000 UNITS tablet Take 2,000 Units by mouth daily.      . Coenzyme Q-10 100 MG capsule Take 100 mg by mouth daily.    . Estradiol Acetate (FEMRING) 0.05 MG/24HR RING Place 1 each vaginally as directed.     . fondaparinux (ARIXTRA) 5 MG/0.4ML SOLN injection Inject 2.5 mg into the skin every other day.     . furosemide (LASIX) 20 MG  tablet Take 1 tablet (20 mg total) by mouth every other day. 90 tablet 3  . furosemide (LASIX) 40 MG tablet Take 1 tablet (40 mg total) by mouth every other day. 90 tablet 3  . levothyroxine (SYNTHROID, LEVOTHROID) 88 MCG tablet Take 88 mcg by mouth daily before breakfast.    . metoprolol succinate (TOPROL-XL) 50 MG 24 hr tablet Take 1 tablet (50 mg total) by mouth 2 (two) times daily. Take with or immediately following a meal. 180 tablet 3  . Misc Natural Products (TART CHERRY ADVANCED PO) Take 2 tablets by mouth daily.    . valACYclovir (VALTREX) 1000 MG tablet Take 1,000 mg by mouth 2 (two) times daily. For a total of 3 doses for labial herpes prn only     Current Facility-Administered Medications  Medication Dose Route Frequency Provider Last Rate Last Dose  . botulinum toxin Type A (BOTOX) injection 100 Units  100 Units Intramuscular Once Drema Dallas, DO      . botulinum toxin Type A (BOTOX) injection 25 Units  25 Units Intramuscular Once Drema Dallas, DO      . botulinum toxin Type A (BOTOX) injection 40 Units  40 Units Intramuscular Once Drema Dallas, DO         Past Medical History  Diagnosis Date  . Rheumatic heart disease 1995    post percutaneous valvuloplasty at [facility] with subsequent mechanical mitral valve replacement and tricuspid annuloplasty at [facility]  . Subdural hematoma ([place]) 2009    on  coumadin  . Heparin-induced thrombocytopenia ([place])   . HTN (hypertension)   . History of Hashimoto thyroiditis   . HLD (hyperlipidemia)   . Atrial flutter ([place])     atypical right atrial flutter s/p ablation at [facility] by Dr Clyda Hurdle  . 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 Pacemaker   . Antiphospholipid antibody syndrome ([place])   . Anemia   . Heart murmur   . CHF (congestive heart failure) ([place])   . Hypothyroidism   . History of blood transfusion     "lots; most in 2007 w/MVR OR"  . Cerebrovascular accident [facility]) ?1990; 1993; 2007     residual "can't use my right hand; left visual field cut" (03/28/2012)  . Arthritis     "may have some" (03/28/2012)  . Chronic kidney disease     "as a result of my heart failing" (03/28/2012)  . Fine motor impairment     "fingers right hand" (03/28/2012)  . Right leg swelling 10/06/2013    Asymmetric; no calf pain, venous doppler negative for DVT 10/06/13    Past Surgical History  Procedure Laterality Date  . Mitral valve replacement  2007    with tricuspid annuloplasty  . Amputation      left atrial appendage amputated with MVR  . Craniotomy  2008    for SDH  . Cholecystectomy    . Tonsillectomy    . Percutaneous balloon valvuloplasty  1995    At [facility] with subsequent mechanical mitral valve replacement and tricuspid annuloplasty at [facility]  . Atrial ablation surgery      CTI and R atriostomy scar flutter ablation at [facility] 8/12,  repeat  ablation at [facility]  9/12  . Cardiac valve replacement    . Tonsillectomy      "when I was a child"  . Total abdominal hysterectomy  ?1995    TAH; BSO  . Biventricular pacemaker placement  03/28/2012    SJM Anthem implanted by Dr Lovena Le  . Cardioversion  2010-2011    "3 at [facility]" (03/28/2012)  . Cardiac electrophysiology mapping and ablation  2012    "2 @ [facility]" (03/28/2012  . Bi-ventricular pacemaker insertion N/A 03/28/2012    Procedure: BI-VENTRICULAR PACEMAKER INSERTION (CRT-P);  Surgeon: Evans Lance, MD;  Location: [facility] CATH LAB;  Service: Cardiovascular;  Laterality: N/A;    Social History   Social History  . Marital Status: Married    Spouse Name: Alease Medina   . Number of Children: 0  . Years of Education: college   Occupational History  . PHYSICIAN     retired   Social History Main Topics  . Smoking status: Former Smoker -- 0.10 packs/day for 6 years    Types: Cigarettes  . Smokeless tobacco: Never Used     Comment: 03/28/2012 "smoked in the 1970's"  . Alcohol Use: 0.0 oz/week    0 Standard drinks or equivalent per week      Comment: wine rarely.  . Drug Use: No  . Sexual Activity: Not on file   Other Topics Concern  . 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  Quit smoking 25 years ago and quit alcohol 2 years ago.   Denies alcohol, caffeine,  and illicit drug use.      Family History  Problem Relation Age of Onset  . Heart disease Father  late 4's early 80's-CABG  . Dementia Father   . Diabetes Father     ROS: no fevers or chills, productive cough, hemoptysis, dysphasia, odynophagia, melena, hematochezia, dysuria, hematuria, rash, seizure activity, orthopnea, PND, pedal edema, claudication. Remaining systems are negative.  Physical Exam: Well-developed well-nourished in no acute distress.  Skin is warm and dry.  HEENT is normal.  Neck is supple.  Chest is clear to auscultation with normal expansion.  Cardiovascular exam is regular rate and rhythm. Crisp mechanical valve sounds Abdominal exam nontender or distended. No masses palpated. Extremities show no edema. neuro grossly intact  ECG Ventricular paced rhythm

## 2015-11-08 ENCOUNTER — Ambulatory Visit (INDEPENDENT_AMBULATORY_CARE_PROVIDER_SITE_OTHER): Payer: Medicare Other | Admitting: *Deleted

## 2015-11-08 ENCOUNTER — Encounter: Payer: Self-pay | Admitting: Cardiology

## 2015-11-08 ENCOUNTER — Ambulatory Visit (INDEPENDENT_AMBULATORY_CARE_PROVIDER_SITE_OTHER): Payer: Medicare Other | Admitting: Cardiology

## 2015-11-08 VITALS — BP 118/78 | HR 80 | Ht 63.0 in | Wt 161.0 lb

## 2015-11-08 DIAGNOSIS — Z9889 Other specified postprocedural states: Secondary | ICD-10-CM

## 2015-11-08 DIAGNOSIS — I429 Cardiomyopathy, unspecified: Secondary | ICD-10-CM

## 2015-11-08 DIAGNOSIS — I10 Essential (primary) hypertension: Secondary | ICD-10-CM | POA: Diagnosis not present

## 2015-11-08 DIAGNOSIS — I5042 Chronic combined systolic (congestive) and diastolic (congestive) heart failure: Secondary | ICD-10-CM

## 2015-11-08 DIAGNOSIS — Z950 Presence of cardiac pacemaker: Secondary | ICD-10-CM | POA: Diagnosis not present

## 2015-11-08 DIAGNOSIS — I4891 Unspecified atrial fibrillation: Secondary | ICD-10-CM

## 2015-11-08 DIAGNOSIS — I495 Sick sinus syndrome: Secondary | ICD-10-CM | POA: Diagnosis not present

## 2015-11-08 LAB — CUP PACEART REMOTE DEVICE CHECK
Battery Voltage: 2.93 V
Implantable Lead Implant Date: 20131003
Implantable Lead Location: 753859
Implantable Lead Location: 753860
Lead Channel Impedance Value: 410 Ohm
Lead Channel Pacing Threshold Amplitude: 0.625 V
Lead Channel Pacing Threshold Amplitude: 0.625 V
Lead Channel Sensing Intrinsic Amplitude: 8.1 mV
Lead Channel Setting Pacing Amplitude: 2 V
Lead Channel Setting Pacing Amplitude: 2 V
Lead Channel Setting Pacing Pulse Width: 0.5 ms
Lead Channel Setting Pacing Pulse Width: 0.8 ms
Lead Channel Setting Sensing Sensitivity: 2 mV
MDC IDC LEAD IMPLANT DT: 20131003
MDC IDC LEAD IMPLANT DT: 20131003
MDC IDC LEAD LOCATION: 753858
MDC IDC MSMT BATTERY REMAINING LONGEVITY: 82 mo
MDC IDC MSMT BATTERY REMAINING PERCENTAGE: 81 %
MDC IDC MSMT LEADCHNL LV IMPEDANCE VALUE: 1150 Ohm
MDC IDC MSMT LEADCHNL LV PACING THRESHOLD PULSEWIDTH: 0.8 ms
MDC IDC MSMT LEADCHNL RV PACING THRESHOLD PULSEWIDTH: 0.5 ms
MDC IDC PG SERIAL: 2786353
MDC IDC SESS DTM: 20170515075047

## 2015-11-08 NOTE — Progress Notes (Signed)
Remote pacemaker transmission.   

## 2015-11-08 NOTE — Patient Instructions (Signed)
Follow-Up:  Your physician recommends that you schedule a follow-up appointment in: [place]

## 2015-11-08 NOTE — Assessment & Plan Note (Signed)
Blood pressure controlled. Continue present medications. 

## 2015-11-08 NOTE — Assessment & Plan Note (Signed)
Continue beta blocker. No ACE inhibitor or ARB given renal insufficiency. I have not added hydralazine and nitrates as she has had problems with hypotension in the past.

## 2015-11-08 NOTE — Assessment & Plan Note (Signed)
Continue beta-blocker and anticoagulation. ?

## 2015-11-08 NOTE — Assessment & Plan Note (Signed)
Followed by electrophysiology. She has had 2 brief dizzy spells that she feels is reminiscent to her torsades. She had her device interrogated today and we will await results. If she has further episodes in the future we will consider a monitor.

## 2015-11-08 NOTE — Assessment & Plan Note (Signed)
Continue anticoagulation and SBE prophylaxis.

## 2015-11-08 NOTE — Assessment & Plan Note (Signed)
Patient appears to be euvolemic on examination. Continue present dose of diuretics. She is very volume sensitive.

## 2015-11-24 ENCOUNTER — Other Ambulatory Visit: Payer: Self-pay | Admitting: Oncology

## 2015-11-24 NOTE — Telephone Encounter (Signed)
Last appt 2/13.

## 2015-11-25 ENCOUNTER — Telehealth: Payer: Self-pay | Admitting: *Deleted

## 2015-11-25 NOTE — Telephone Encounter (Signed)
Pt states she has a lab appt on Monday the 5th which will be a trough and if you want a peak to be drawn, she have to come back on the 12th. Thanks

## 2015-11-25 NOTE — Telephone Encounter (Signed)
Pt called - appt changed to the 12th @ 12noon.

## 2015-11-25 NOTE — Telephone Encounter (Signed)
Let's jut have her move all lab to the 12th so we can get the peak arixtra level

## 2015-11-29 ENCOUNTER — Other Ambulatory Visit: Payer: Medicare Other

## 2015-12-06 ENCOUNTER — Other Ambulatory Visit (INDEPENDENT_AMBULATORY_CARE_PROVIDER_SITE_OTHER): Payer: Medicare Other

## 2015-12-06 DIAGNOSIS — N184 Chronic kidney disease, stage 4 (severe): Secondary | ICD-10-CM | POA: Diagnosis not present

## 2015-12-06 DIAGNOSIS — D6861 Antiphospholipid syndrome: Secondary | ICD-10-CM | POA: Diagnosis not present

## 2015-12-06 DIAGNOSIS — Z7901 Long term (current) use of anticoagulants: Secondary | ICD-10-CM

## 2015-12-06 DIAGNOSIS — D696 Thrombocytopenia, unspecified: Secondary | ICD-10-CM

## 2015-12-07 LAB — COMPREHENSIVE METABOLIC PANEL
A/G RATIO: 1.6 (ref 1.2–2.2)
ALT: 20 IU/L (ref 0–32)
AST: 32 IU/L (ref 0–40)
Albumin: 4 g/dL (ref 3.6–4.8)
Alkaline Phosphatase: 109 IU/L (ref 39–117)
BILIRUBIN TOTAL: 0.4 mg/dL (ref 0.0–1.2)
BUN/Creatinine Ratio: 29 — ABNORMAL HIGH (ref 12–28)
BUN: 62 mg/dL — ABNORMAL HIGH (ref 8–27)
CALCIUM: 8.4 mg/dL — AB (ref 8.7–10.3)
CHLORIDE: 95 mmol/L — AB (ref 96–106)
CO2: 23 mmol/L (ref 18–29)
Creatinine, Ser: 2.13 mg/dL — ABNORMAL HIGH (ref 0.57–1.00)
GFR, EST AFRICAN AMERICAN: 27 mL/min/{1.73_m2} — AB (ref >59)
GFR, EST NON AFRICAN AMERICAN: 23 mL/min/{1.73_m2} — AB (ref >59)
GLOBULIN, TOTAL: 2.5 g/dL (ref 1.5–4.5)
Glucose: 99 mg/dL (ref 65–99)
POTASSIUM: 4.6 mmol/L (ref 3.5–5.2)
SODIUM: 138 mmol/L (ref 134–144)
TOTAL PROTEIN: 6.5 g/dL (ref 6.0–8.5)

## 2015-12-07 LAB — CBC WITH DIFFERENTIAL/PLATELET
BASOS ABS: 0 10*3/uL (ref 0.0–0.2)
BASOS: 0 %
EOS (ABSOLUTE): 0.2 10*3/uL (ref 0.0–0.4)
Eos: 4 %
HEMATOCRIT: 33.2 % — AB (ref 34.0–46.6)
Hemoglobin: 11.3 g/dL (ref 11.1–15.9)
IMMATURE GRANS (ABS): 0 10*3/uL (ref 0.0–0.1)
Immature Granulocytes: 0 %
LYMPHS ABS: 0.6 10*3/uL — AB (ref 0.7–3.1)
LYMPHS: 12 %
MCH: 30.5 pg (ref 26.6–33.0)
MCHC: 34 g/dL (ref 31.5–35.7)
MCV: 90 fL (ref 79–97)
Monocytes Absolute: 0.3 10*3/uL (ref 0.1–0.9)
Monocytes: 6 %
NEUTROS ABS: 4 10*3/uL (ref 1.4–7.0)
Neutrophils: 78 %
Platelets: 62 10*3/uL — CL (ref 150–379)
RBC: 3.7 x10E6/uL — ABNORMAL LOW (ref 3.77–5.28)
RDW: 13.5 % (ref 12.3–15.4)
WBC: 5.2 10*3/uL (ref 3.4–10.8)

## 2015-12-07 LAB — HEPARIN ANTI-XA: HEPARIN ANTI-XA: 0.93 [IU]/mL

## 2015-12-08 ENCOUNTER — Encounter: Payer: Self-pay | Admitting: Cardiology

## 2015-12-08 ENCOUNTER — Telehealth: Payer: Self-pay | Admitting: *Deleted

## 2015-12-08 NOTE — Telephone Encounter (Signed)
-----   Message from Annia Belt, MD sent at 12/07/2015  2:53 PM EDT ----- Call pt: lab all close to baseline; stay on same dose arixtra.  I will forward results to Dr Lorrene Reid & Inda Merlin

## 2015-12-08 NOTE — Telephone Encounter (Signed)
Pt called - no answer; left message " labs close to baseline, stay on same dose of Arixtra" and he will forward results to Dr Lorrene Reid and Dr Inda Merlin per Dr Beryle Beams. And to call if she has any questions.

## 2015-12-13 ENCOUNTER — Encounter: Payer: Self-pay | Admitting: Oncology

## 2015-12-13 ENCOUNTER — Ambulatory Visit (INDEPENDENT_AMBULATORY_CARE_PROVIDER_SITE_OTHER): Payer: Medicare Other | Admitting: Oncology

## 2015-12-13 VITALS — BP 106/71 | HR 87 | Temp 97.7°F | Ht 63.0 in | Wt 158.7 lb

## 2015-12-13 DIAGNOSIS — Z950 Presence of cardiac pacemaker: Secondary | ICD-10-CM | POA: Diagnosis not present

## 2015-12-13 DIAGNOSIS — D696 Thrombocytopenia, unspecified: Secondary | ICD-10-CM

## 2015-12-13 DIAGNOSIS — Z9889 Other specified postprocedural states: Secondary | ICD-10-CM

## 2015-12-13 DIAGNOSIS — N189 Chronic kidney disease, unspecified: Secondary | ICD-10-CM | POA: Diagnosis not present

## 2015-12-13 DIAGNOSIS — Z862 Personal history of diseases of the blood and blood-forming organs and certain disorders involving the immune mechanism: Secondary | ICD-10-CM

## 2015-12-13 DIAGNOSIS — Z952 Presence of prosthetic heart valve: Secondary | ICD-10-CM

## 2015-12-13 DIAGNOSIS — D6959 Other secondary thrombocytopenia: Secondary | ICD-10-CM | POA: Diagnosis not present

## 2015-12-13 DIAGNOSIS — I129 Hypertensive chronic kidney disease with stage 1 through stage 4 chronic kidney disease, or unspecified chronic kidney disease: Secondary | ICD-10-CM

## 2015-12-13 DIAGNOSIS — Z7901 Long term (current) use of anticoagulants: Secondary | ICD-10-CM

## 2015-12-13 DIAGNOSIS — D6861 Antiphospholipid syndrome: Secondary | ICD-10-CM | POA: Diagnosis present

## 2015-12-13 NOTE — Patient Instructions (Signed)
Change lab to every 2 months next 02/05/16 MD visit 6 months

## 2015-12-13 NOTE — Progress Notes (Signed)
Patient ID: [name], female   DOB: 04-30-1946, 70 y.o.   MRN: [id number]  Hematology and Oncology Follow Up Visit  SADIAH BAEZ [id number] January 10, 1946 70 y.o. 12/13/2015 11:47 AM   Principle Diagnosis: Encounter Diagnoses  Name Primary?  . Thrombocytopenia ([place]) Yes  . Antiphospholipid antibody with hypercoagulable state ([place])   . MITRAL VALVE REPLACEMENT, HX OF   . 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 She has had recurrent thrombotic events including an episode of amaurosis fugax and a TIA in the past. She has chronic moderate thrombocytopenia. She had an initial mitral valvuloplasty at [facility]. She developed advanced mitral valve disease and had to undergo an emergency mitral valve replacement at [facility] when she went into acute congestive heart failure with a concomitant DIC picture. She had a mechanical mitral valve replacement. 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 She developed chronic renal insufficiency. She has a persistent left lateral visual field cut and persistent weakness and contracture of her left hand. She suffered a subdural hematoma after minor head trauma while on coumadin requiring emergency evacuation. Attempts to go back on Coumadin resulted in a recurrent subdural. She had an anaphylactic reaction to vitamin K in attempt to reverse the Coumadin at time of the second intracranial bleed. 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  She has been maintained on Arixtra injections 2.5 mg every other day with a single 5 mg dose weekly despite her chronic renal dysfunction in view of the Coumadin related hemorrhages and the history of heparin-induced thrombocytopenia  .  Despite  the mitral valve replacement, she has had refractory atrial arrhythmias. She has required DC cardioversion and a number of attempts at radiofrequency ablation. She had to have a pacemaker placed. She is still in atrial fibrillation.   She has chronic renal insufficiency, multifactorial with hypotensive insults at time of her mitral valve surgery, possible emboli related to her chronic atrial fibrillation, and essential hypertension. Possible contribution from her antiphospholipid antibody syndrome.   Interim History:   Overall stable since last visit with me in February. BUN and creatinine at her approximate baseline done on June 12, 62 and 2.1 respectively. She feels that she is dehydrated. She decided to decrease her Lasix from 40 mg to 20 mg when we called her with the lab report. Currently no dyspnea, no chest pain, no palpitations. She remains in chronic atrial fibrillation. Now with a pacemaker. She sustained minor trauma to her arm on her car door trying to withdraw money from an oral teller. She has a large ecchymosis on the left forearm. No headache or change in vision. She does feel that her memory is slowly getting worse.  Medications: reviewed  Allergies:  Allergies  Allergen Reactions  . Amiodarone Hcl Other (See Comments)    "torsades; v tach"  . 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 Vitamin K Anaphylaxis    IV only allergy  . Iodinated Diagnostic Agents Itching    "over 35 years ago" (03/28/2012)    Review of Systems: See interim history Remaining ROS negative:   Physical Exam: Blood pressure 106/71, pulse 87, temperature 97.7 F (36.5 C), temperature source Oral, height 5\' 3"  (1.6 m), weight 158 lb 11.2 oz (71.986 kg), SpO2 100 %. Wt Readings from Last  3 Encounters:  12/13/15 158 lb 11.2 oz (71.986 kg)  11/08/15 161 lb (73.029 kg)  08/09/15 158 lb 4.8 oz (71.804 kg)     General appearance: well nourished Caucasian woman HENNT: Pharynx no erythema, exudate,  mass, or ulcer. No thyromegaly or thyroid nodules Lymph nodes: No cervical, supraclavicular, or axillary lymphadenopathy Breasts:  Lungs: Clear to auscultation, resonant to percussion throughout Heart: Regular rhythm,mechanical heart valve sounds Abdomen: Soft, nontender, normal bowel sounds, no mass, no organomegaly Extremities: No edema, no calf tenderness Musculoskeletal: no joint deformities GU:  Vascular: Carotid pulses 2+, no bruits,  Neurologic: Alert, oriented, PERRLA, optic discs not well visualized, no hemorrhage or exudate, cranial nerves grossly normal, chronic field cut lateral left eye motor strength 5 over 5, reflexes 1+ symmetric, upper body coordination normal, gait normal, Skin: No rash; large ecchymosis left forearm from minor trauma  Lab Results: CBC W/Diff    Component Value Date/Time   WBC 5.2 12/06/2015 1004   WBC 6.4 08/02/2015 1315   WBC 5.0 08/04/2014 1150   RBC 3.70* 12/06/2015 1004   RBC 3.54* 08/02/2015 1315   RBC 4.02 08/04/2014 1150   RBC 4.02 08/04/2014 1150   HGB 10.7* 08/02/2015 1315   HGB 12.0 08/04/2014 1150   HCT 33.2* 12/06/2015 1004   HCT 33.2* 08/02/2015 1315   HCT 35.8* 08/04/2014 1150   PLT 62* 12/06/2015 1004   PLT 66* 08/02/2015 1315   PLT 97* 08/04/2014 1150   MCV 90 12/06/2015 1004   MCV 93.8 08/02/2015 1315   MCV 89.1 08/04/2014 1150   MCH 30.5 12/06/2015 1004   MCH 30.2 08/02/2015 1315   MCH 29.9 08/04/2014 1150   MCHC 34.0 12/06/2015 1004   MCHC 32.2 08/02/2015 1315   MCHC 33.5 08/04/2014 1150   RDW 13.5 12/06/2015 1004   RDW 12.7 08/02/2015 1315   RDW 13.9 08/04/2014 1150   LYMPHSABS 0.6* 12/06/2015 1004   LYMPHSABS 0.9 08/02/2015 1315   LYMPHSABS 0.9 08/04/2014 1150   MONOABS 0.4 08/02/2015 1315   MONOABS 0.4 08/04/2014 1150   EOSABS 0.2 12/06/2015 1004   EOSABS 0.3 08/02/2015 1315   EOSABS 0.2 08/04/2014 1150   BASOSABS 0.0 12/06/2015 1004   BASOSABS 0.1 08/02/2015 1315   BASOSABS 0.1 08/04/2014 1150      Chemistry      Component Value Date/Time   NA 138 12/06/2015 1004   NA 142 08/02/2015 1315   NA 139 08/04/2014 1150   K 4.6 12/06/2015 1004   K 4.4 08/02/2015 1315   CL 95* 12/06/2015 1004   CL 108* 11/04/2012 1158   CO2 23 12/06/2015 1004   CO2 25 08/02/2015 1315   BUN 62* 12/06/2015 1004   BUN 62.9* 08/02/2015 1315   BUN 58* 08/04/2014 1150   CREATININE 2.13* 12/06/2015 1004   CREATININE 1.9* 08/02/2015 1315   CREATININE 1.86* 08/04/2014 1150      Component Value Date/Time   CALCIUM 8.4* 12/06/2015 1004   CALCIUM 9.1 08/02/2015 1315   ALKPHOS 109 12/06/2015 1004   ALKPHOS 103 08/02/2015 1315   AST 32 12/06/2015 1004   AST 36* 08/02/2015 1315   ALT 20 12/06/2015 1004   ALT 31 08/02/2015 1315   BILITOT 0.4 12/06/2015 1004   BILITOT 0.60 08/02/2015 1315   BILITOT 0.6 08/04/2014 1150       Radiological Studies: No results found.  Impression:  #1.Antiphospholipid antibody syndrome status post multiple thrombotic events  She remains stable on Arixtra anticoagulation.  Plan: Continue long-term anticoagulation.  Target Arixtra level 0.8-1.1. Recently dose changed back to 2.5 mg every other day Most recent level done on 12/06/2015 0.93.  #2. Chronic mild thrombocytopenia secondary to #1  Platelet count fluctuates lowest value in the last year 62,000, average 70-80,000.  #3. Chronic renal insufficiency likely due to perioperative complications at time of mitral valve replacement in 2007.  Previous baseline creatinine 1.5-1.8. Over the last 12 months slow deterioration with new baseline 1.8-2.3. She is followed closely by nephrology, Dr. Lorrene Reid.  #4. Mitral valve disease likely secondary to #1. Status post initial valvuloplasty then emergency mechanical mitral valve replacement 2007   #5. Atrial fibrillation/flutter- now status post pacemaker implantation having failed DC cardioversion and a radiofrequency ablation procedure.   #6. Essential hypertension  Blood  pressure well controlled on current regimen  #7. Hypothyroid on replacement  #8. 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 Coumadin permanently discontinued.  #10. History of anaphylactic reaction to IV vitamin K.  CC: Patient Care Team: Josetta Huddle, MD as PCP - General (Internal Medicine) Annia Belt, MD as Consulting Physician (Oncology) Jamal Maes, MD as Consulting Physician (Nephrology) Lelon Perla, MD as Consulting Physician (Cardiology) Thompson Grayer, MD as Consulting Physician (Cardiology)   Annia Belt, MD 6/19/201711:47 AM

## 2015-12-22 ENCOUNTER — Encounter: Payer: Self-pay | Admitting: Cardiology

## 2015-12-29 ENCOUNTER — Other Ambulatory Visit: Payer: Self-pay

## 2015-12-29 MED ORDER — METOPROLOL SUCCINATE ER 50 MG PO TB24: 50.0000 mg | ORAL_TABLET | Freq: Two times a day (BID) | ORAL | Status: DC

## 2016-01-10 DIAGNOSIS — Z6828 Body mass index (BMI) 28.0-28.9, adult: Secondary | ICD-10-CM | POA: Diagnosis not present

## 2016-01-10 DIAGNOSIS — D631 Anemia in chronic kidney disease: Secondary | ICD-10-CM | POA: Diagnosis not present

## 2016-01-10 DIAGNOSIS — Z7901 Long term (current) use of anticoagulants: Secondary | ICD-10-CM | POA: Diagnosis not present

## 2016-01-10 DIAGNOSIS — I519 Heart disease, unspecified: Secondary | ICD-10-CM | POA: Diagnosis not present

## 2016-01-10 DIAGNOSIS — E039 Hypothyroidism, unspecified: Secondary | ICD-10-CM | POA: Diagnosis not present

## 2016-01-10 DIAGNOSIS — N2581 Secondary hyperparathyroidism of renal origin: Secondary | ICD-10-CM | POA: Diagnosis not present

## 2016-01-10 DIAGNOSIS — N189 Chronic kidney disease, unspecified: Secondary | ICD-10-CM | POA: Diagnosis not present

## 2016-01-10 DIAGNOSIS — I4891 Unspecified atrial fibrillation: Secondary | ICD-10-CM | POA: Diagnosis not present

## 2016-01-10 DIAGNOSIS — I4892 Unspecified atrial flutter: Secondary | ICD-10-CM | POA: Diagnosis not present

## 2016-01-10 DIAGNOSIS — I10 Essential (primary) hypertension: Secondary | ICD-10-CM | POA: Diagnosis not present

## 2016-01-14 ENCOUNTER — Other Ambulatory Visit: Payer: Self-pay | Admitting: Obstetrics and Gynecology

## 2016-01-14 DIAGNOSIS — Z139 Encounter for screening, unspecified: Secondary | ICD-10-CM

## 2016-01-24 ENCOUNTER — Other Ambulatory Visit: Payer: Medicare Other

## 2016-02-01 ENCOUNTER — Encounter: Payer: Self-pay | Admitting: Internal Medicine

## 2016-02-04 ENCOUNTER — Other Ambulatory Visit (INDEPENDENT_AMBULATORY_CARE_PROVIDER_SITE_OTHER): Payer: Medicare Other

## 2016-02-04 DIAGNOSIS — Z950 Presence of cardiac pacemaker: Secondary | ICD-10-CM | POA: Diagnosis not present

## 2016-02-04 DIAGNOSIS — D6861 Antiphospholipid syndrome: Secondary | ICD-10-CM

## 2016-02-04 DIAGNOSIS — Z952 Presence of prosthetic heart valve: Secondary | ICD-10-CM | POA: Diagnosis not present

## 2016-02-04 DIAGNOSIS — Z9889 Other specified postprocedural states: Secondary | ICD-10-CM

## 2016-02-04 DIAGNOSIS — D696 Thrombocytopenia, unspecified: Secondary | ICD-10-CM

## 2016-02-05 LAB — COMPREHENSIVE METABOLIC PANEL
ALBUMIN: 3.7 g/dL (ref 3.6–4.8)
ALK PHOS: 97 IU/L (ref 39–117)
ALT: 18 IU/L (ref 0–32)
AST: 26 IU/L (ref 0–40)
Albumin/Globulin Ratio: 1.4 (ref 1.2–2.2)
BUN / CREAT RATIO: 29 — AB (ref 12–28)
BUN: 55 mg/dL — AB (ref 8–27)
Bilirubin Total: 0.5 mg/dL (ref 0.0–1.2)
CHLORIDE: 101 mmol/L (ref 96–106)
CO2: 21 mmol/L (ref 18–29)
CREATININE: 1.88 mg/dL — AB (ref 0.57–1.00)
Calcium: 8.6 mg/dL — ABNORMAL LOW (ref 8.7–10.3)
GFR calc Af Amer: 31 mL/min/{1.73_m2} — ABNORMAL LOW (ref >59)
GFR calc non Af Amer: 27 mL/min/{1.73_m2} — ABNORMAL LOW (ref >59)
GLUCOSE: 88 mg/dL (ref 65–99)
Globulin, Total: 2.6 g/dL (ref 1.5–4.5)
Potassium: 4 mmol/L (ref 3.5–5.2)
Sodium: 140 mmol/L (ref 134–144)
Total Protein: 6.3 g/dL (ref 6.0–8.5)

## 2016-02-05 LAB — CBC WITH DIFFERENTIAL/PLATELET
BASOS ABS: 0.1 10*3/uL (ref 0.0–0.2)
Basos: 1 %
EOS (ABSOLUTE): 0.3 10*3/uL (ref 0.0–0.4)
EOS: 6 %
HEMOGLOBIN: 10 g/dL — AB (ref 11.1–15.9)
Hematocrit: 29.7 % — ABNORMAL LOW (ref 34.0–46.6)
IMMATURE GRANULOCYTES: 0 %
Immature Grans (Abs): 0 10*3/uL (ref 0.0–0.1)
Lymphocytes Absolute: 0.9 10*3/uL (ref 0.7–3.1)
Lymphs: 19 %
MCH: 30.9 pg (ref 26.6–33.0)
MCHC: 33.7 g/dL (ref 31.5–35.7)
MCV: 92 fL (ref 79–97)
MONOCYTES: 7 %
MONOS ABS: 0.3 10*3/uL (ref 0.1–0.9)
NEUTROS PCT: 67 %
Neutrophils Absolute: 3 10*3/uL (ref 1.4–7.0)
PLATELETS: 48 10*3/uL — AB (ref 150–379)
RBC: 3.24 x10E6/uL — AB (ref 3.77–5.28)
RDW: 14.4 % (ref 12.3–15.4)
WBC: 4.5 10*3/uL (ref 3.4–10.8)

## 2016-02-05 LAB — HEPARIN ANTI-XA: HEPARIN ANTI-XA: 0.88 [IU]/mL

## 2016-02-07 ENCOUNTER — Other Ambulatory Visit: Payer: Self-pay | Admitting: Oncology

## 2016-02-07 DIAGNOSIS — D649 Anemia, unspecified: Secondary | ICD-10-CM

## 2016-02-07 DIAGNOSIS — D6861 Antiphospholipid syndrome: Secondary | ICD-10-CM

## 2016-02-07 DIAGNOSIS — D696 Thrombocytopenia, unspecified: Secondary | ICD-10-CM

## 2016-02-14 ENCOUNTER — Ambulatory Visit (INDEPENDENT_AMBULATORY_CARE_PROVIDER_SITE_OTHER): Payer: Medicare Other | Admitting: Internal Medicine

## 2016-02-14 ENCOUNTER — Encounter: Payer: Self-pay | Admitting: Internal Medicine

## 2016-02-14 VITALS — BP 110/70 | HR 85 | Ht 63.0 in | Wt 160.2 lb

## 2016-02-14 DIAGNOSIS — I481 Persistent atrial fibrillation: Secondary | ICD-10-CM

## 2016-02-14 DIAGNOSIS — Z950 Presence of cardiac pacemaker: Secondary | ICD-10-CM

## 2016-02-14 DIAGNOSIS — I5042 Chronic combined systolic (congestive) and diastolic (congestive) heart failure: Secondary | ICD-10-CM | POA: Diagnosis not present

## 2016-02-14 DIAGNOSIS — I4819 Other persistent atrial fibrillation: Secondary | ICD-10-CM

## 2016-02-14 DIAGNOSIS — I495 Sick sinus syndrome: Secondary | ICD-10-CM

## 2016-02-14 NOTE — Progress Notes (Signed)
PCP: Henrine Screws, MD Primary Cardiologist:  Dr Agustina Caroli is a 70 y.o. female who presents today for routine electrophysiology followup.  Since last being seen in our clinic, the patient reports doing well.  She has had a big garden this year.  Today, she denies symptoms of palpitations, chest pain, shortness of breath,  lower extremity edema, dizziness, presyncope, or syncope.  The patient is otherwise without complaint today.   Past Medical History:  Diagnosis Date  . Anemia   . Antiphospholipid antibody syndrome ([place])   . Arthritis    "may have some" (03/28/2012)  . Atrial flutter ([place])    atypical right atrial flutter s/p ablation at [facility] by Dr Clyda Hurdle  . Cerebrovascular accident [facility]) ?1990; 1993; 2007   residual "can't use my right hand; left visual field cut" (03/28/2012)  . CHF (congestive heart failure) ([place])   . Chronic kidney disease    "as a result of my heart failing" (03/28/2012)  . Fine motor impairment    "fingers right hand" (03/28/2012)  . Heart murmur   . Heparin-induced thrombocytopenia ([place])   . History of blood transfusion    "lots; most in 2007 w/MVR OR"  . History of Hashimoto thyroiditis   . HLD (hyperlipidemia)   . HTN (hypertension)   . Hypothyroidism   . 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 Pacemaker   . Rheumatic heart disease 1995   post percutaneous valvuloplasty at [facility] with subsequent mechanical mitral valve replacement and tricuspid annuloplasty at [facility]  . Right leg swelling 10/06/2013   Asymmetric; no calf pain, venous doppler negative for DVT 10/06/13  . Subdural hematoma ([place]) 2009   on coumadin   Past Surgical History:  Procedure Laterality Date  . AMPUTATION     left atrial appendage amputated with MVR  . ATRIAL ABLATION SURGERY     CTI and R atriostomy scar flutter ablation at [facility] 8/12,  repeat  ablation at [facility]  9/12  . BI-VENTRICULAR PACEMAKER INSERTION N/A 03/28/2012   Procedure: BI-VENTRICULAR PACEMAKER INSERTION (CRT-P);  Surgeon: Evans Lance, MD;  Location: [facility] CATH LAB;  Service: Cardiovascular;  Laterality: N/A;  . biventricular pacemaker placement  03/28/2012   SJM Anthem implanted by Dr Lovena Le  . CARDIAC ELECTROPHYSIOLOGY [place] AND ABLATION  2012   "2 @ [facility]" (03/28/2012  . CARDIAC VALVE REPLACEMENT    . CARDIOVERSION  2010-2011   "3 at [facility]" (03/28/2012)  . CHOLECYSTECTOMY    . CRANIOTOMY  2008   for SDH  . MITRAL VALVE REPLACEMENT  2007   with tricuspid annuloplasty  . [place] with subsequent mechanical mitral valve replacement and tricuspid annuloplasty at [facility]  . TONSILLECTOMY    . TONSILLECTOMY     "when I was a child"  . TOTAL ABDOMINAL HYSTERECTOMY  ?1995   TAH; BSO    Current Outpatient Prescriptions  Medication Sig Dispense Refill  . acetaminophen (TYLENOL) 650 MG CR tablet Take 1,300 mg by mouth 3 (three) times daily as needed for pain.     Marland Kitchen AMOXICILLIN PO Take 4 capsules by mouth as directed. ONLY WHEN VISITING DENTIST    . Calcium Citrate (CITRACAL PO) Take 1 tablet by mouth daily.    . Cholecalciferol (VITAMIN D) 2000 UNITS tablet Take 2,000 Units by mouth daily.      . Coenzyme Q-10 100 MG capsule Take 100 mg by mouth daily.    Marland Kitchen  Estradiol Acetate (FEMRING) 0.05 MG/24HR RING Place 1 each vaginally as directed.     . fluticasone (FLONASE) 50 MCG/ACT nasal spray Place 2 sprays into both nostrils daily.    . fondaparinux (ARIXTRA) 2.5 MG/0.5ML SOLN injection INJECT 0.5ML INTO THE SKIN EVERY OTHER DAY 9.5 mL 11  . furosemide (LASIX) 20 MG tablet Take 20 mg by mouth daily.    Marland Kitchen levothyroxine (SYNTHROID, LEVOTHROID) 88 MCG tablet Take 88 mcg by mouth daily before breakfast.    . metoprolol succinate (TOPROL-XL) 50 MG 24 hr tablet Take 1 tablet (50 mg total) by mouth 2 (two) times daily. Take with or immediately following a meal. 180 tablet 3  . Misc Natural Products  (TART CHERRY ADVANCED PO) Take 2 tablets by mouth daily.    . valACYclovir (VALTREX) 1000 MG tablet Take 1,000 mg by mouth 2 (two) times daily. For a total of 3 doses for labial herpes prn only     Current Facility-Administered Medications  Medication Dose Route Frequency Provider Last Rate Last Dose  . botulinum toxin Type A (BOTOX) injection 100 Units  100 Units Intramuscular Once Drema Dallas, DO      . botulinum toxin Type A (BOTOX) injection 25 Units  25 Units Intramuscular Once Drema Dallas, DO      . botulinum toxin Type A (BOTOX) injection 40 Units  40 Units Intramuscular Once Drema Dallas, DO        Physical Exam: Vitals:   02/14/16 1608  BP: 110/70  Pulse: 85  Weight: 160 lb 3.2 oz (72.7 kg)  Height: 5\' 3"  (1.6 m)    GEN- The patient is well appearing, alert and oriented x 3 today.   Head- normocephalic, atraumatic Eyes-  Sclera clear, conjunctiva pink Ears- hearing intact Oropharynx- clear Lungs- Clear to ausculation bilaterally, normal work of breathing Chest- pacemaker pocket is well healed Heart- Regular rate and rhythm (paced), mechanical S1 is crisp GI- soft, NT, ND, + BS Extremities- no clubbing, cyanosis, or edema  Pacemaker interrogation- reviewed in detail today,  See PACEART report  Assessment and Plan:  1. Tach/brady Normal BiV pacemaker function with 98% Biv pacing See Pace Art report No changes today  2. afib Continue long term rate control and anticoagulation. Given valvular disease and severe LA enlargement, she is not likely to achieve/ maintain sinus.    3. Valvular heart disease Stable No change required today No changes at this time  [place] Return in 1 year Follow-up with Dr Stanford Breed as scheduled  Thompson Grayer MD, [facility] 02/14/2016 10:05 PM

## 2016-02-14 NOTE — Patient Instructions (Addendum)
Medication Instructions:  Your physician recommends that you continue on your current medications as directed. Please refer to the Current Medication list given to you today.   Labwork: None ordered   Testing/Procedures: None ordered   Follow-Up: Your physician wants you to follow-up in: 12 months with Dr. Rayann Heman.  You will receive a reminder letter in the mail two months in advance. If you don't receive a letter, please call our office to schedule the follow-up appointment.    Remote monitoring is used to monitor your Pacemaker from home. This monitoring reduces the number of office visits required to check your device to one time per year. It allows [place] to keep an eye on the functioning of your device to ensure it is working properly. You are scheduled for a device check from home on 05/15/16. You may send your transmission at any time that day. If you have a wireless device, the transmission will be sent automatically. After your physician reviews your transmission, you will receive a postcard with your next transmission date.   Your physician wants you to follow-up in: 12 months with Dr Vallery Ridge will receive a reminder letter in the mail two months in advance. If you don't receive a letter, please call our office to schedule the follow-up appointment.     Any Other Special Instructions Will Be Listed Below (If Applicable).     If you need a refill on your cardiac medications before your next appointment, please call your pharmacy.

## 2016-02-15 LAB — CUP PACEART INCLINIC DEVICE CHECK
Date Time Interrogation Session: 20170821201326
Implantable Lead Location: 753860
Lead Channel Impedance Value: 1125 Ohm
Lead Channel Pacing Threshold Amplitude: 0.5 V
Lead Channel Pacing Threshold Amplitude: 0.75 V
Lead Channel Pacing Threshold Amplitude: 0.75 V
Lead Channel Pacing Threshold Pulse Width: 0.8 ms
Lead Channel Pacing Threshold Pulse Width: 0.8 ms
Lead Channel Setting Pacing Amplitude: 2 V
Lead Channel Setting Pacing Pulse Width: 0.5 ms
MDC IDC LEAD IMPLANT DT: 20131003
MDC IDC LEAD IMPLANT DT: 20131003
MDC IDC LEAD IMPLANT DT: 20131003
MDC IDC LEAD LOCATION: 753858
MDC IDC LEAD LOCATION: 753859
MDC IDC MSMT BATTERY REMAINING LONGEVITY: 69.6
MDC IDC MSMT BATTERY VOLTAGE: 2.92 V
MDC IDC MSMT LEADCHNL RA PACING THRESHOLD PULSEWIDTH: 0.5 ms
MDC IDC MSMT LEADCHNL RV IMPEDANCE VALUE: 400 Ohm
MDC IDC MSMT LEADCHNL RV PACING THRESHOLD AMPLITUDE: 0.75 V
MDC IDC MSMT LEADCHNL RV PACING THRESHOLD PULSEWIDTH: 0.5 ms
MDC IDC MSMT LEADCHNL RV SENSING INTR AMPL: 8.5 mV
MDC IDC SET LEADCHNL LV PACING PULSEWIDTH: 0.8 ms
MDC IDC SET LEADCHNL RV PACING AMPLITUDE: 2 V
MDC IDC SET LEADCHNL RV SENSING SENSITIVITY: 2 mV
MDC IDC STAT BRADY RA PERCENT PACED: 0 %
MDC IDC STAT BRADY RV PERCENT PACED: 98 %
Pulse Gen Model: 3210
Pulse Gen Serial Number: 2786353

## 2016-02-21 ENCOUNTER — Other Ambulatory Visit (INDEPENDENT_AMBULATORY_CARE_PROVIDER_SITE_OTHER): Payer: Medicare Other

## 2016-02-21 DIAGNOSIS — D6861 Antiphospholipid syndrome: Secondary | ICD-10-CM

## 2016-02-21 DIAGNOSIS — D649 Anemia, unspecified: Secondary | ICD-10-CM

## 2016-02-21 DIAGNOSIS — D696 Thrombocytopenia, unspecified: Secondary | ICD-10-CM | POA: Diagnosis present

## 2016-02-21 LAB — CBC WITH DIFFERENTIAL/PLATELET
Basophils Absolute: 0.1 10*3/uL (ref 0.0–0.1)
Basophils Relative: 1 %
Eosinophils Absolute: 0.2 10*3/uL (ref 0.0–0.7)
Eosinophils Relative: 4 %
HEMATOCRIT: 32.6 % — AB (ref 36.0–46.0)
HEMOGLOBIN: 10.4 g/dL — AB (ref 12.0–15.0)
LYMPHS ABS: 0.8 10*3/uL (ref 0.7–4.0)
LYMPHS PCT: 15 %
MCH: 30.7 pg (ref 26.0–34.0)
MCHC: 31.9 g/dL (ref 30.0–36.0)
MCV: 96.2 fL (ref 78.0–100.0)
MONO ABS: 0.2 10*3/uL (ref 0.1–1.0)
MONOS PCT: 4 %
NEUTROS ABS: 4.2 10*3/uL (ref 1.7–7.7)
Neutrophils Relative %: 76 %
Platelets: 58 10*3/uL — ABNORMAL LOW (ref 150–400)
RBC: 3.39 MIL/uL — ABNORMAL LOW (ref 3.87–5.11)
RDW: 13 % (ref 11.5–15.5)
WBC: 5.6 10*3/uL (ref 4.0–10.5)

## 2016-02-21 LAB — RETICULOCYTES
RBC.: 3.39 MIL/uL — ABNORMAL LOW (ref 3.87–5.11)
Retic Count, Absolute: 40.7 10*3/uL (ref 19.0–186.0)
Retic Ct Pct: 1.2 % (ref 0.4–3.1)

## 2016-02-21 LAB — LACTATE DEHYDROGENASE: LDH: 287 U/L — AB (ref 98–192)

## 2016-02-21 LAB — SAVE SMEAR

## 2016-02-22 ENCOUNTER — Telehealth: Payer: Self-pay | Admitting: *Deleted

## 2016-02-22 NOTE — Telephone Encounter (Signed)
Pt called / informed - "Hb stable, platelets improved c/w 2 weeks ago - will just watch for now " per Dr Beryle Beams. Pt stated ok and will see [place] at next appt.

## 2016-02-23 DIAGNOSIS — Z23 Encounter for immunization: Secondary | ICD-10-CM | POA: Diagnosis not present

## 2016-04-03 ENCOUNTER — Ambulatory Visit
Admission: RE | Admit: 2016-04-03 | Discharge: 2016-04-03 | Disposition: A | Discharge status: Home / Self Care | Payer: Medicare Other | Source: Ambulatory Visit | Attending: Obstetrics and Gynecology | Admitting: Obstetrics and Gynecology

## 2016-04-03 DIAGNOSIS — Z139 Encounter for screening, unspecified: Secondary | ICD-10-CM

## 2016-04-03 DIAGNOSIS — Z1231 Encounter for screening mammogram for malignant neoplasm of breast: Secondary | ICD-10-CM | POA: Diagnosis not present

## 2016-04-10 ENCOUNTER — Other Ambulatory Visit (INDEPENDENT_AMBULATORY_CARE_PROVIDER_SITE_OTHER): Payer: Medicare Other

## 2016-04-10 DIAGNOSIS — Z9889 Other specified postprocedural states: Secondary | ICD-10-CM | POA: Diagnosis not present

## 2016-04-10 DIAGNOSIS — Z950 Presence of cardiac pacemaker: Secondary | ICD-10-CM | POA: Diagnosis not present

## 2016-04-10 DIAGNOSIS — D696 Thrombocytopenia, unspecified: Secondary | ICD-10-CM

## 2016-04-10 DIAGNOSIS — D6861 Antiphospholipid syndrome: Secondary | ICD-10-CM | POA: Diagnosis not present

## 2016-04-10 DIAGNOSIS — Z952 Presence of prosthetic heart valve: Secondary | ICD-10-CM | POA: Diagnosis not present

## 2016-04-11 LAB — COMPREHENSIVE METABOLIC PANEL
A/G RATIO: 1.7 (ref 1.2–2.2)
ALBUMIN: 4.3 g/dL (ref 3.6–4.8)
ALK PHOS: 143 IU/L — AB (ref 39–117)
ALT: 56 IU/L — ABNORMAL HIGH (ref 0–32)
AST: 95 IU/L — ABNORMAL HIGH (ref 0–40)
BILIRUBIN TOTAL: 0.4 mg/dL (ref 0.0–1.2)
BUN / CREAT RATIO: 28 (ref 12–28)
BUN: 57 mg/dL — AB (ref 8–27)
CHLORIDE: 104 mmol/L (ref 96–106)
CO2: 21 mmol/L (ref 18–29)
Calcium: 8.8 mg/dL (ref 8.7–10.3)
Creatinine, Ser: 2.04 mg/dL — ABNORMAL HIGH (ref 0.57–1.00)
GFR calc non Af Amer: 24 mL/min/{1.73_m2} — ABNORMAL LOW (ref >59)
GFR, EST AFRICAN AMERICAN: 28 mL/min/{1.73_m2} — AB (ref >59)
GLOBULIN, TOTAL: 2.5 g/dL (ref 1.5–4.5)
GLUCOSE: 104 mg/dL — AB (ref 65–99)
POTASSIUM: 4.6 mmol/L (ref 3.5–5.2)
SODIUM: 142 mmol/L (ref 134–144)
Total Protein: 6.8 g/dL (ref 6.0–8.5)

## 2016-04-11 LAB — CBC WITH DIFFERENTIAL/PLATELET
Basophils Absolute: 0.1 10*3/uL (ref 0.0–0.2)
Basos: 1 %
EOS (ABSOLUTE): 0.3 10*3/uL (ref 0.0–0.4)
EOS: 4 %
HEMOGLOBIN: 10.8 g/dL — AB (ref 11.1–15.9)
Hematocrit: 32.8 % — ABNORMAL LOW (ref 34.0–46.6)
Immature Grans (Abs): 0 10*3/uL (ref 0.0–0.1)
Immature Granulocytes: 0 %
LYMPHS ABS: 1 10*3/uL (ref 0.7–3.1)
Lymphs: 13 %
MCH: 30.8 pg (ref 26.6–33.0)
MCHC: 32.9 g/dL (ref 31.5–35.7)
MCV: 93 fL (ref 79–97)
MONOS ABS: 0.3 10*3/uL (ref 0.1–0.9)
Monocytes: 4 %
NEUTROS ABS: 5.8 10*3/uL (ref 1.4–7.0)
Neutrophils: 78 %
Platelets: 72 10*3/uL — CL (ref 150–379)
RBC: 3.51 x10E6/uL — AB (ref 3.77–5.28)
RDW: 12.9 % (ref 12.3–15.4)
WBC: 7.4 10*3/uL (ref 3.4–10.8)

## 2016-04-11 LAB — HEPARIN ANTI-XA: HEPARIN ANTI-XA: 0.9 [IU]/mL

## 2016-04-13 ENCOUNTER — Telehealth: Payer: Self-pay | Admitting: *Deleted

## 2016-04-13 NOTE — Telephone Encounter (Signed)
-----   Message from Annia Belt, MD sent at 04/11/2016  5:08 PM EDT ----- Call Dr Kathyrn Drown: Richarda Blade level good. Stay on current dose.  Hb stable, platelets better than recent values at 72,000. Creatinine 2.0; new liver function abnormalities. Transaminases 2x normal. Mild increase alkaline phosphatase. Bilirubin normal. Has she started any new meds? She can call me on my cell to discuss

## 2016-04-13 NOTE — Telephone Encounter (Signed)
Pt called / informed "arixtra level good. Stay on current dose. Hb stable, platelets better than recent values at 72,000.  Creatinine 2.0; new liver function abnormalities. Transaminases 2x normal. Mild increase alkaline phosphatase. Bilirubin normal."per Dr Beryle Beams. Stated she has not started any new medications. And decline offer to call u on your cell - unless there's something else she should do. Thanks

## 2016-04-26 DIAGNOSIS — I4891 Unspecified atrial fibrillation: Secondary | ICD-10-CM | POA: Diagnosis not present

## 2016-04-26 DIAGNOSIS — N189 Chronic kidney disease, unspecified: Secondary | ICD-10-CM | POA: Diagnosis not present

## 2016-04-26 DIAGNOSIS — Z7901 Long term (current) use of anticoagulants: Secondary | ICD-10-CM | POA: Diagnosis not present

## 2016-04-26 DIAGNOSIS — N2581 Secondary hyperparathyroidism of renal origin: Secondary | ICD-10-CM | POA: Diagnosis not present

## 2016-04-26 DIAGNOSIS — I4892 Unspecified atrial flutter: Secondary | ICD-10-CM | POA: Diagnosis not present

## 2016-04-26 DIAGNOSIS — I10 Essential (primary) hypertension: Secondary | ICD-10-CM | POA: Diagnosis not present

## 2016-04-26 DIAGNOSIS — D631 Anemia in chronic kidney disease: Secondary | ICD-10-CM | POA: Diagnosis not present

## 2016-04-26 DIAGNOSIS — I519 Heart disease, unspecified: Secondary | ICD-10-CM | POA: Diagnosis not present

## 2016-04-26 DIAGNOSIS — Z6828 Body mass index (BMI) 28.0-28.9, adult: Secondary | ICD-10-CM | POA: Diagnosis not present

## 2016-05-08 NOTE — Progress Notes (Signed)
HPI: FU rheumatic heart disease, atrial tachycardia/flutter, and combined systolic/diastolic congestive heart failure. The patient had mitral valvuloplasty at [facility] in the late 90s. In 2007 she had a mechanical mitral valve replacement at [facility]. 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 She's had a prior subdural hematoma that required evacuation. She also has a history of heparin-induced thrombocytopenia. She is followed by Dr Rayann Heman for SVT and is s/p ablation at [facility]; also with h/o torsades with amiodarone. Has had previous biventricular pacemaker. Nuclear study October 2015 showed an ejection fraction of 35%. There was a fixed anterior/apical defect consistent with scar. No ischemia. Echo 6/16 showed EF 35-40, trace AI, mechanical MV, moderate to severe LAE, mildly reduced RV function. Patient is very volume sensitive. Since last seen, patient denies increased dyspnea, chest pain, palpitations or syncope. Note while she was in the bathroom today the door closed and hit her left elbow. There is ecchymosis and a small laceration.  Current Outpatient Prescriptions  Medication Sig Dispense Refill  . acetaminophen (TYLENOL) 650 MG CR tablet Take 1,300 mg by mouth 3 (three) times daily as needed for pain.     Marland Kitchen AMOXICILLIN PO Take 4 capsules by mouth as directed. ONLY WHEN VISITING DENTIST    . Calcium Citrate (CITRACAL PO) Take 1 tablet by mouth daily.    . Cholecalciferol (VITAMIN D) 2000 UNITS tablet Take 2,000 Units by mouth daily.      . Coenzyme Q-10 100 MG capsule Take 100 mg by mouth daily.    . Estradiol Acetate (FEMRING) 0.05 MG/24HR RING Place 1 each vaginally as directed.     . fluticasone (FLONASE) 50 MCG/ACT nasal spray Place 2 sprays into both nostrils daily.    . fondaparinux (ARIXTRA) 2.5 MG/0.5ML SOLN injection INJECT 0.5ML INTO THE SKIN EVERY OTHER DAY 9.5 mL 11   . furosemide (LASIX) 20 MG tablet Take 20 mg by mouth daily.    Marland Kitchen levothyroxine (SYNTHROID, LEVOTHROID) 88 MCG tablet Take 88 mcg by mouth daily before breakfast.    . metoprolol succinate (TOPROL-XL) 50 MG 24 hr tablet Take 1 tablet (50 mg total) by mouth 2 (two) times daily. Take with or immediately following a meal. 180 tablet 3  . Misc Natural Products (TART CHERRY ADVANCED PO) Take 2 tablets by mouth daily.    . Omega-3 Fatty Acids (OMEGA-3 FISH OIL) 500 MG CAPS Take 1 capsule by mouth daily.    . valACYclovir (VALTREX) 1000 MG tablet Take 1,000 mg by mouth 2 (two) times daily. For a total of 3 doses for labial herpes prn only     Current Facility-Administered Medications  Medication Dose Route Frequency Provider Last Rate Last Dose  . botulinum toxin Type A (BOTOX) injection 100 Units  100 Units Intramuscular Once Drema Dallas, DO      . botulinum toxin Type A (BOTOX) injection 25 Units  25 Units Intramuscular Once Drema Dallas, DO      . botulinum toxin Type A (BOTOX) injection 40 Units  40 Units Intramuscular Once Drema Dallas, DO         Past Medical History:  Diagnosis Date  . Anemia   . Antiphospholipid antibody syndrome ([place])   . Arthritis    "may have some" (03/28/2012)  . Atrial flutter ([place])    atypical right atrial flutter s/p ablation at [facility] by Dr  Mouncey  . Cerebrovascular accident [facility]) ?1990; 1993; 2007   residual "can't use my right hand; left visual field cut" (03/28/2012)  . CHF (congestive heart failure) ([place])   . Chronic kidney disease    "as a result of my heart failing" (03/28/2012)  . Fine motor impairment    "fingers right hand" (03/28/2012)  . Heart murmur   . Heparin-induced thrombocytopenia ([place])   . History of blood transfusion    "lots; most in 2007 w/MVR OR"  . History of Hashimoto thyroiditis   . HLD (hyperlipidemia)   . HTN (hypertension)   . Hypothyroidism   . 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 Pacemaker   .  Rheumatic heart disease 1995   post percutaneous valvuloplasty at [facility] with subsequent mechanical mitral valve replacement and tricuspid annuloplasty at [facility]  . Right leg swelling 10/06/2013   Asymmetric; no calf pain, venous doppler negative for DVT 10/06/13  . Subdural hematoma ([place]) 2009   on coumadin    Past Surgical History:  Procedure Laterality Date  . AMPUTATION     left atrial appendage amputated with MVR  . ATRIAL ABLATION SURGERY     CTI and R atriostomy scar flutter ablation at [facility] 8/12,  repeat  ablation at [facility]  9/12  . BI-VENTRICULAR PACEMAKER INSERTION N/A 03/28/2012   Procedure: BI-VENTRICULAR PACEMAKER INSERTION (CRT-P);  Surgeon: Evans Lance, MD;  Location: [facility] CATH LAB;  Service: Cardiovascular;  Laterality: N/A;  . biventricular pacemaker placement  03/28/2012   SJM Anthem implanted by Dr Lovena Le  . CARDIAC ELECTROPHYSIOLOGY [place] AND ABLATION  2012   "2 @ [facility]" (03/28/2012  . CARDIAC VALVE REPLACEMENT    . CARDIOVERSION  2010-2011   "3 at [facility]" (03/28/2012)  . CHOLECYSTECTOMY    . CRANIOTOMY  2008   for SDH  . MITRAL VALVE REPLACEMENT  2007   with tricuspid annuloplasty  . [place] with subsequent mechanical mitral valve replacement and tricuspid annuloplasty at [facility]  . TONSILLECTOMY    . TONSILLECTOMY     "when I was a child"  . TOTAL ABDOMINAL HYSTERECTOMY  ?1995   TAH; BSO    Social History   Social History  . Marital status: Married    Spouse name: Alease Medina   . Number of children: 0  . Years of education: college   Occupational History  . PHYSICIAN Retired    retired   Social History Main Topics  . Smoking status: Former Smoker    Packs/day: 0.10    Years: 6.00    Types: Cigarettes  . Smokeless tobacco: Never Used     Comment: 03/28/2012 "smoked in the 1970's"  . Alcohol use 0.0 oz/week     Comment: wine rarely.  . Drug use: No  . Sexual activity: Not on file     Other Topics Concern  . 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  Quit smoking 25 years ago and quit alcohol 2 years ago.   Denies alcohol, caffeine,  and illicit drug use.      Family History  Problem Relation Age of Onset  . Heart disease Father     late 38's early 80's-CABG  . Dementia Father   . Diabetes Father     ROS: no fevers or chills, productive cough, hemoptysis, dysphasia, odynophagia,  melena, hematochezia, dysuria, hematuria, rash, seizure activity, orthopnea, PND, pedal edema, claudication. Remaining systems are negative.  Physical Exam: Well-developed well-nourished in no acute distress.  Skin is warm and dry.  HEENT is normal.  Neck is supple.  Chest is clear to auscultation with normal expansion.  Cardiovascular exam is regular rate and rhythm. Crisp mechanical valve Abdominal exam nontender or distended. No masses palpated. Extremities show no edema. Left upper extremity with small laceration from trauma in the office. Also ecchymosis noted.  neuro grossly intact  ECG-ventricular pacing with underlying atrial fibrillation.  A/P  1 status post biventricular pacemaker-followed by electrophysiology.  2 mitral valve replacement-continue anticoagulation which is followed by hematology. Continue SBE prophylaxis.  3 hypertension-blood pressure controlled. Continue present medications.  4 chronic combined systolic/diastolic congestive heart failure-continue present dose of diuretic. Her symptoms are well controlled. Note she is very volume sensitive. Renal function is followed by nephrology.  5 cardiomyopathy-continue beta blocker. She is not on an ACE inhibitor or ARB because of renal insufficiency. We have not added hydralazine/nitrates because of history of hypotension.  6 permanent atrial fibrillation-continue beta blocker and anticoagulation.  7 small laceration left upper  extremity-patient was hit by a bathroom door in the office today. There is mild ecchymosis and laceration that does not require suture. It was dressed in the office.   Kirk Ruths, MD

## 2016-05-15 ENCOUNTER — Ambulatory Visit (INDEPENDENT_AMBULATORY_CARE_PROVIDER_SITE_OTHER): Payer: Medicare Other | Admitting: Cardiology

## 2016-05-15 ENCOUNTER — Encounter: Payer: Self-pay | Admitting: Cardiology

## 2016-05-15 ENCOUNTER — Ambulatory Visit (INDEPENDENT_AMBULATORY_CARE_PROVIDER_SITE_OTHER): Payer: Medicare Other | Admitting: *Deleted

## 2016-05-15 ENCOUNTER — Telehealth: Payer: Self-pay | Admitting: Cardiology

## 2016-05-15 VITALS — BP 115/76 | HR 87

## 2016-05-15 DIAGNOSIS — Z9889 Other specified postprocedural states: Secondary | ICD-10-CM | POA: Diagnosis not present

## 2016-05-15 DIAGNOSIS — I420 Dilated cardiomyopathy: Secondary | ICD-10-CM | POA: Diagnosis not present

## 2016-05-15 DIAGNOSIS — I4891 Unspecified atrial fibrillation: Secondary | ICD-10-CM | POA: Diagnosis not present

## 2016-05-15 DIAGNOSIS — I495 Sick sinus syndrome: Secondary | ICD-10-CM | POA: Diagnosis not present

## 2016-05-15 DIAGNOSIS — I10 Essential (primary) hypertension: Secondary | ICD-10-CM

## 2016-05-15 NOTE — Telephone Encounter (Signed)
LMOVM reminding pt to send remote transmission.   

## 2016-05-15 NOTE — Patient Instructions (Signed)
Your physician wants you to follow-up in: 6 months with dr Trellis Moment will receive a reminder letter in the mail two months in advance. If you don't receive a letter, please call our office to schedule the follow-up appointment.   If you need a refill on your cardiac medications before your next appointment, please call your pharmacy.

## 2016-05-16 NOTE — Progress Notes (Signed)
Remote pacemaker transmission.   

## 2016-05-17 ENCOUNTER — Encounter: Payer: Self-pay | Admitting: Cardiology

## 2016-06-02 DIAGNOSIS — I10 Essential (primary) hypertension: Secondary | ICD-10-CM | POA: Diagnosis not present

## 2016-06-02 DIAGNOSIS — E785 Hyperlipidemia, unspecified: Secondary | ICD-10-CM | POA: Diagnosis not present

## 2016-06-02 DIAGNOSIS — E559 Vitamin D deficiency, unspecified: Secondary | ICD-10-CM | POA: Diagnosis not present

## 2016-06-02 DIAGNOSIS — E039 Hypothyroidism, unspecified: Secondary | ICD-10-CM | POA: Diagnosis not present

## 2016-06-02 DIAGNOSIS — Z79899 Other long term (current) drug therapy: Secondary | ICD-10-CM | POA: Diagnosis not present

## 2016-06-05 ENCOUNTER — Telehealth: Payer: Self-pay | Admitting: *Deleted

## 2016-06-05 ENCOUNTER — Other Ambulatory Visit: Payer: Medicare Other

## 2016-06-05 DIAGNOSIS — I672 Cerebral atherosclerosis: Secondary | ICD-10-CM | POA: Diagnosis not present

## 2016-06-05 DIAGNOSIS — D6959 Other secondary thrombocytopenia: Secondary | ICD-10-CM | POA: Diagnosis not present

## 2016-06-05 DIAGNOSIS — Z9889 Other specified postprocedural states: Secondary | ICD-10-CM

## 2016-06-05 DIAGNOSIS — Z0001 Encounter for general adult medical examination with abnormal findings: Secondary | ICD-10-CM | POA: Diagnosis not present

## 2016-06-05 DIAGNOSIS — R259 Unspecified abnormal involuntary movements: Secondary | ICD-10-CM | POA: Diagnosis not present

## 2016-06-05 DIAGNOSIS — D6861 Antiphospholipid syndrome: Secondary | ICD-10-CM | POA: Diagnosis not present

## 2016-06-05 DIAGNOSIS — Z1389 Encounter for screening for other disorder: Secondary | ICD-10-CM | POA: Diagnosis not present

## 2016-06-05 DIAGNOSIS — Z950 Presence of cardiac pacemaker: Secondary | ICD-10-CM

## 2016-06-05 DIAGNOSIS — D696 Thrombocytopenia, unspecified: Secondary | ICD-10-CM

## 2016-06-05 DIAGNOSIS — I421 Obstructive hypertrophic cardiomyopathy: Secondary | ICD-10-CM | POA: Diagnosis not present

## 2016-06-05 DIAGNOSIS — D6859 Other primary thrombophilia: Secondary | ICD-10-CM | POA: Diagnosis not present

## 2016-06-05 DIAGNOSIS — J309 Allergic rhinitis, unspecified: Secondary | ICD-10-CM | POA: Diagnosis not present

## 2016-06-05 DIAGNOSIS — N183 Chronic kidney disease, stage 3 (moderate): Secondary | ICD-10-CM | POA: Diagnosis not present

## 2016-06-05 DIAGNOSIS — I63349 Cerebral infarction due to thrombosis of unspecified cerebellar artery: Secondary | ICD-10-CM | POA: Diagnosis not present

## 2016-06-05 NOTE — Telephone Encounter (Signed)
Message from pt - stated she had labs done at Dr Inda Merlin' office last Monday and labs were to be sent to you. Also stated today is a trough day. And has an appt to see him this morning. Wants to know if she needs labs today or next week.

## 2016-06-05 NOTE — Telephone Encounter (Addendum)
Copy of labs in your mailbox; BMET and Hepatic panel/Lipid done also. Pt came to the clinic - inform will wait on Heparin-AntiXa level until next week at her appt/peak day; informed we have received labs from Dr Inda Merlin' office.Stated Dr Inda Merlin concerned platelets are 40 (from 62).

## 2016-06-05 NOTE — Addendum Note (Signed)
Addended by: Truddie Crumble on: 06/05/2016 12:12 PM   Modules accepted: Orders

## 2016-06-05 NOTE — Telephone Encounter (Signed)
I read Dr Synthia Innocent note and unfortunately cannot comment on labs today vs next week. Dr Darnell Level back 13th.

## 2016-06-05 NOTE — Telephone Encounter (Signed)
-----   Message from Annia Belt, MD sent at 06/05/2016 11:53 AM EST ----- No but make sure we get copy of labs from Dr Inda Merlin: needs to have repeat CMET if he did not do this ----- Message ----- From: [name], RN Sent: 06/05/2016   9:46 AM To: Annia Belt, MD  Message from pt - stated she had labs done at Dr Inda Merlin' office last Monday and labs were to be sent to you. Also stated today is a trough day. And has an appt to see him this morning. Wants to know if she needs labs today or next week.

## 2016-06-07 LAB — CUP PACEART REMOTE DEVICE CHECK
Battery Remaining Percentage: 73 %
Implantable Lead Implant Date: 20131003
Implantable Lead Location: 753859
Implantable Lead Location: 753860
Implantable Pulse Generator Implant Date: 20131003
Lead Channel Impedance Value: 360 Ohm
Lead Channel Pacing Threshold Amplitude: 0.75 V
Lead Channel Pacing Threshold Pulse Width: 0.5 ms
Lead Channel Setting Pacing Amplitude: 2 V
Lead Channel Setting Pacing Pulse Width: 0.5 ms
Lead Channel Setting Pacing Pulse Width: 0.8 ms
MDC IDC LEAD IMPLANT DT: 20131003
MDC IDC LEAD IMPLANT DT: 20131003
MDC IDC LEAD LOCATION: 753858
MDC IDC MSMT BATTERY REMAINING LONGEVITY: 71 mo
MDC IDC MSMT BATTERY VOLTAGE: 2.92 V
MDC IDC MSMT LEADCHNL LV IMPEDANCE VALUE: 1000 Ohm
MDC IDC MSMT LEADCHNL LV PACING THRESHOLD AMPLITUDE: 0.625 V
MDC IDC MSMT LEADCHNL LV PACING THRESHOLD PULSEWIDTH: 0.8 ms
MDC IDC MSMT LEADCHNL RV SENSING INTR AMPL: 7.6 mV
MDC IDC PG SERIAL: 2786353
MDC IDC SESS DTM: 20171121160839
MDC IDC SET LEADCHNL RV PACING AMPLITUDE: 2 V
MDC IDC SET LEADCHNL RV SENSING SENSITIVITY: 2 mV
Pulse Gen Model: 3210

## 2016-06-08 ENCOUNTER — Other Ambulatory Visit: Payer: Self-pay | Admitting: Oncology

## 2016-06-08 DIAGNOSIS — D696 Thrombocytopenia, unspecified: Secondary | ICD-10-CM

## 2016-06-08 DIAGNOSIS — Z7901 Long term (current) use of anticoagulants: Secondary | ICD-10-CM

## 2016-06-08 DIAGNOSIS — D6861 Antiphospholipid syndrome: Secondary | ICD-10-CM

## 2016-06-09 DIAGNOSIS — L821 Other seborrheic keratosis: Secondary | ICD-10-CM | POA: Diagnosis not present

## 2016-06-09 DIAGNOSIS — D692 Other nonthrombocytopenic purpura: Secondary | ICD-10-CM | POA: Diagnosis not present

## 2016-06-09 DIAGNOSIS — L820 Inflamed seborrheic keratosis: Secondary | ICD-10-CM | POA: Diagnosis not present

## 2016-06-09 DIAGNOSIS — L814 Other melanin hyperpigmentation: Secondary | ICD-10-CM | POA: Diagnosis not present

## 2016-06-09 DIAGNOSIS — D1801 Hemangioma of skin and subcutaneous tissue: Secondary | ICD-10-CM | POA: Diagnosis not present

## 2016-06-09 DIAGNOSIS — Z85828 Personal history of other malignant neoplasm of skin: Secondary | ICD-10-CM | POA: Diagnosis not present

## 2016-06-09 DIAGNOSIS — D225 Melanocytic nevi of trunk: Secondary | ICD-10-CM | POA: Diagnosis not present

## 2016-06-09 DIAGNOSIS — Z124 Encounter for screening for malignant neoplasm of cervix: Secondary | ICD-10-CM | POA: Diagnosis not present

## 2016-06-12 ENCOUNTER — Other Ambulatory Visit: Payer: Self-pay | Admitting: *Deleted

## 2016-06-12 ENCOUNTER — Encounter: Payer: Self-pay | Admitting: Oncology

## 2016-06-12 ENCOUNTER — Ambulatory Visit (INDEPENDENT_AMBULATORY_CARE_PROVIDER_SITE_OTHER): Payer: Medicare Other | Admitting: Oncology

## 2016-06-12 VITALS — BP 124/91 | HR 86 | Temp 97.3°F | Ht 63.0 in | Wt 162.3 lb

## 2016-06-12 DIAGNOSIS — Z8673 Personal history of transient ischemic attack (TIA), and cerebral infarction without residual deficits: Secondary | ICD-10-CM

## 2016-06-12 DIAGNOSIS — Z9889 Other specified postprocedural states: Secondary | ICD-10-CM

## 2016-06-12 DIAGNOSIS — Z7901 Long term (current) use of anticoagulants: Secondary | ICD-10-CM

## 2016-06-12 DIAGNOSIS — D6859 Other primary thrombophilia: Secondary | ICD-10-CM

## 2016-06-12 DIAGNOSIS — E039 Hypothyroidism, unspecified: Secondary | ICD-10-CM | POA: Diagnosis not present

## 2016-06-12 DIAGNOSIS — D696 Thrombocytopenia, unspecified: Secondary | ICD-10-CM | POA: Diagnosis present

## 2016-06-12 DIAGNOSIS — D6861 Antiphospholipid syndrome: Secondary | ICD-10-CM | POA: Diagnosis not present

## 2016-06-12 DIAGNOSIS — Z90710 Acquired absence of both cervix and uterus: Secondary | ICD-10-CM

## 2016-06-12 DIAGNOSIS — Z952 Presence of prosthetic heart valve: Secondary | ICD-10-CM | POA: Diagnosis not present

## 2016-06-12 DIAGNOSIS — Z79899 Other long term (current) drug therapy: Secondary | ICD-10-CM | POA: Diagnosis not present

## 2016-06-12 DIAGNOSIS — Z950 Presence of cardiac pacemaker: Secondary | ICD-10-CM | POA: Diagnosis not present

## 2016-06-12 DIAGNOSIS — Z87891 Personal history of nicotine dependence: Secondary | ICD-10-CM

## 2016-06-12 DIAGNOSIS — I428 Other cardiomyopathies: Secondary | ICD-10-CM

## 2016-06-12 DIAGNOSIS — I480 Paroxysmal atrial fibrillation: Secondary | ICD-10-CM

## 2016-06-12 DIAGNOSIS — Z888 Allergy status to other drugs, medicaments and biological substances status: Secondary | ICD-10-CM

## 2016-06-12 DIAGNOSIS — Z91041 Radiographic dye allergy status: Secondary | ICD-10-CM | POA: Diagnosis not present

## 2016-06-12 DIAGNOSIS — I272 Pulmonary hypertension, unspecified: Secondary | ICD-10-CM

## 2016-06-12 LAB — CBC WITH DIFFERENTIAL/PLATELET
Basophils Absolute: 0 10*3/uL (ref 0.0–0.1)
Basophils Relative: 1 %
EOS ABS: 0.2 10*3/uL (ref 0.0–0.7)
EOS PCT: 4 %
HCT: 31.4 % — ABNORMAL LOW (ref 36.0–46.0)
Hemoglobin: 10.3 g/dL — ABNORMAL LOW (ref 12.0–15.0)
LYMPHS ABS: 0.8 10*3/uL (ref 0.7–4.0)
Lymphocytes Relative: 15 %
MCH: 30.1 pg (ref 26.0–34.0)
MCHC: 32.8 g/dL (ref 30.0–36.0)
MCV: 91.8 fL (ref 78.0–100.0)
MONOS PCT: 6 %
Monocytes Absolute: 0.3 10*3/uL (ref 0.1–1.0)
Neutro Abs: 4.3 10*3/uL (ref 1.7–7.7)
Neutrophils Relative %: 75 %
PLATELETS: 43 10*3/uL — AB (ref 150–400)
RBC: 3.42 MIL/uL — ABNORMAL LOW (ref 3.87–5.11)
RDW: 12.9 % (ref 11.5–15.5)
WBC: 5.7 10*3/uL (ref 4.0–10.5)

## 2016-06-12 LAB — HEPARIN ANTI-XA: Heparin LMW: 0.7 IU/mL

## 2016-06-12 LAB — RETICULOCYTES
RBC.: 3.42 MIL/uL — ABNORMAL LOW (ref 3.87–5.11)
Retic Count, Absolute: 34.2 10*3/uL (ref 19.0–186.0)
Retic Ct Pct: 1 % (ref 0.4–3.1)

## 2016-06-12 LAB — LACTATE DEHYDROGENASE: LDH: 282 U/L — AB (ref 98–192)

## 2016-06-12 LAB — SAVE SMEAR

## 2016-06-12 NOTE — Patient Instructions (Addendum)
Every 2 week CBCs next 12/27 Ultrasound of liver & spleen 12/27 at [facility] Radiology MD visit 2-3 months

## 2016-06-13 NOTE — Progress Notes (Signed)
Hematology and Oncology Follow Up Visit  [name] [id number] 03-05-1946 70 y.o. 06/13/2016 12:17 PM   Principle Diagnosis: Encounter Diagnoses  Name Primary?  . Chronic anticoagulation   . Primary hypercoagulable state ([place])   . Thrombocytopenia ([place]) Yes  . Antiphospholipid antibody with hypercoagulable state ([place])   . MITRAL VALVE REPLACEMENT, HX OF   . Pulmonary hypertension   Clinical Summary: See previous notes. Computer issues today. Unable to copy/paste summary.   Interim History:  Overall doing well and no complaints today. Cardiac issues stable. She fatigues easily and gets dyspneic if she has to walk any distance. She is trying to build up her endurance for an upcoming trip to [place]. No chest pain or palpitations. Denies headache, or any new focal weakness. No bleeding. In to see her internest last week.  Platelet count 40,000. Lower than her baseline of 60-70,000. Repeat today 42,000 so reproducible.  Medications: reviewed  Allergies:  Allergies  Allergen Reactions  . Amiodarone Hcl Other (See Comments)    "torsades; v tach"  . 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 Vitamin K Anaphylaxis    IV only allergy  . Iodinated Diagnostic Agents Itching    "over 35 years ago" (03/28/2012)    Review of Systems: See interim history Remaining ROS negative:   Physical Exam: Blood pressure (!) 124/91, pulse 86, temperature 97.3 F (36.3 C), temperature source Oral, height 5\' 3"  (1.6 m), weight 162 lb 4.8 oz (73.6 kg), SpO2 100 %. Wt Readings from Last 3 Encounters:  06/12/16 162 lb 4.8 oz (73.6 kg)  02/14/16 160 lb 3.2 oz (72.7 kg)  12/13/15 158 lb 11.2 oz (72 kg)     General appearance: well nourished Caucasian woman  HENNT: Pharynx no erythema, exudate, mass, or ulcer. No thyromegaly or thyroid nodules Lymph nodes: No cervical, supraclavicular, or axillary lymphadenopathy Breasts: recent exam by NP in her GYN office normal per pt Lungs: Clear to  auscultation, resonant to percussion throughout Heart: Regular rhythm, mechanical heart valve sounds no murmur, no gallop, no rub, no click, no edema Abdomen: Soft, nontender, normal bowel sounds, no mass, no organomegaly Extremities: No edema, no calf tenderness Musculoskeletal: no joint deformities GU:  Vascular: Carotid pulses 2+, no bruits, Neurologic: Alert, oriented, PERRLA, optic discs chronically blurred,  vessels normal, no hemorrhage or exudate, cranial nerves grossly normal, motor strength 5 over 5 except chronic weakness left hand grip with decreased strength and coordination, reflexes 1+ symmetric, upper body coordination normal, gait normal, Skin: No rash; extensive ecchymosis skin, dorsum right hand  Lab Results: CBC W/Diff    Component Value Date/Time   WBC 5.7 06/12/2016 1100   RBC 3.42 (L) 06/12/2016 1100   RBC 3.42 (L) 06/12/2016 1100   HGB 10.3 (L) 06/12/2016 1100   HGB 10.7 (L) 08/02/2015 1315   HCT 31.4 (L) 06/12/2016 1100   HCT 32.8 (L) 04/10/2016 1157   HCT 33.2 (L) 08/02/2015 1315   PLT 43 (L) 06/12/2016 1100   PLT 72 (LL) 04/10/2016 1157   MCV 91.8 06/12/2016 1100   MCV 93 04/10/2016 1157   MCV 93.8 08/02/2015 1315   MCH 30.1 06/12/2016 1100   MCHC 32.8 06/12/2016 1100   RDW 12.9 06/12/2016 1100   RDW 12.9 04/10/2016 1157   RDW 12.7 08/02/2015 1315   LYMPHSABS 0.8 06/12/2016 1100   LYMPHSABS 1.0 04/10/2016 1157   LYMPHSABS 0.9 08/02/2015 1315   MONOABS 0.3 06/12/2016 1100   MONOABS 0.4 08/02/2015 1315  EOSABS 0.2 06/12/2016 1100   EOSABS 0.3 04/10/2016 1157   BASOSABS 0.0 06/12/2016 1100   BASOSABS 0.1 04/10/2016 1157   BASOSABS 0.1 08/02/2015 1315     Chemistry      Component Value Date/Time   NA 142 04/10/2016 1157   NA 142 08/02/2015 1315   K 4.6 04/10/2016 1157   K 4.4 08/02/2015 1315   CL 104 04/10/2016 1157   CL 108 (H) 11/04/2012 1158   CO2 21 04/10/2016 1157   CO2 25 08/02/2015 1315   BUN 57 (H) 04/10/2016 1157   BUN 62.9 (H)  08/02/2015 1315   CREATININE 2.04 (H) 04/10/2016 1157   CREATININE 1.9 (H) 08/02/2015 1315      Component Value Date/Time   CALCIUM 8.8 04/10/2016 1157   CALCIUM 9.1 08/02/2015 1315   ALKPHOS 143 (H) 04/10/2016 1157   ALKPHOS 103 08/02/2015 1315   AST 95 (H) 04/10/2016 1157   AST 36 (H) 08/02/2015 1315   ALT 56 (H) 04/10/2016 1157   ALT 31 08/02/2015 1315   BILITOT 0.4 04/10/2016 1157   BILITOT 0.60 08/02/2015 1315       Radiological Studies: No results found.  Impression:  #1. Acute on chronic thrombocytopenia I have always attributed this to her antiphospholipid antibody syndrome. 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 I am going to get an abdominal ultrasound to rule out passive congestion of spleen at potential reason for fall in platelet count from baseline. If no evidence for this, we discussed potential need for Rx of immune thrombocytopenia. My first preference would be a short course, 4 days, high dose dexamethasone. Monitor platelet count more closely for now. She has little margin for error given the fact she is on full dose anticoagulation. Currently not on antiplatelet agents. Review of med list does not reveal any drugs commonly associated with thrombocytopenia. There are rare, anecdotal reports with Arixtra. We really have no other choices for anticoagulants. She is unwilling to go back on coumadin given past major complications. NOACS contra-indicated with mechanical valves due to unacceptable thrombosis risk #2. Antiphospholipid antibody syndrome S/P CVA, TIAs, amarosis fugax on chronic Arixtra anticoagulation. See discussion above.  #3. Valvular heart disease S/P mechanical MVR on chronic anticoaguation.  #4. Biventricular CHF  #5.S/P Pacemaker  #6. Hx PAF  #7. Chronic renal insufficiency  #8. Hypothyroid on replacement  #9. 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 Hx coumadin related recurrent subdural hematoma                     CC: Patient Care Team: Josetta Huddle, MD as PCP - General (Internal Medicine) Annia Belt, MD as Consulting Physician (Oncology) Jamal Maes, MD as Consulting Physician (Nephrology) Lelon Perla, MD as Consulting Physician (Cardiology) Thompson Grayer, MD as Consulting Physician (Cardiology)   Annia Belt, MD 12/19/201712:17 PM

## 2016-06-16 ENCOUNTER — Telehealth: Payer: Self-pay | Admitting: Internal Medicine

## 2016-06-16 NOTE — Telephone Encounter (Signed)
APT. REMINDER CALL, LMTCB °

## 2016-06-21 ENCOUNTER — Other Ambulatory Visit (INDEPENDENT_AMBULATORY_CARE_PROVIDER_SITE_OTHER): Payer: Medicare Other

## 2016-06-21 ENCOUNTER — Ambulatory Visit (HOSPITAL_COMMUNITY)
Admission: RE | Admit: 2016-06-21 | Discharge: 2016-06-21 | Disposition: A | Discharge status: Home / Self Care | Payer: Medicare Other | Source: Ambulatory Visit | Attending: Oncology | Admitting: Oncology

## 2016-06-21 DIAGNOSIS — D696 Thrombocytopenia, unspecified: Secondary | ICD-10-CM | POA: Diagnosis not present

## 2016-06-21 DIAGNOSIS — I272 Pulmonary hypertension, unspecified: Secondary | ICD-10-CM | POA: Diagnosis not present

## 2016-06-21 DIAGNOSIS — Z9889 Other specified postprocedural states: Secondary | ICD-10-CM | POA: Insufficient documentation

## 2016-06-21 DIAGNOSIS — D6859 Other primary thrombophilia: Secondary | ICD-10-CM | POA: Diagnosis present

## 2016-06-21 DIAGNOSIS — D6861 Antiphospholipid syndrome: Secondary | ICD-10-CM

## 2016-06-21 DIAGNOSIS — Z7901 Long term (current) use of anticoagulants: Secondary | ICD-10-CM

## 2016-06-21 LAB — CBC WITH DIFFERENTIAL/PLATELET
Basophils Absolute: 0.1 10*3/uL (ref 0.0–0.1)
Basophils Relative: 1 %
Eosinophils Absolute: 0.3 10*3/uL (ref 0.0–0.7)
Eosinophils Relative: 4 %
HEMATOCRIT: 32.4 % — AB (ref 36.0–46.0)
HEMOGLOBIN: 10.4 g/dL — AB (ref 12.0–15.0)
LYMPHS ABS: 1.1 10*3/uL (ref 0.7–4.0)
LYMPHS PCT: 15 %
MCH: 29.6 pg (ref 26.0–34.0)
MCHC: 32.1 g/dL (ref 30.0–36.0)
MCV: 92.3 fL (ref 78.0–100.0)
MONOS PCT: 4 %
Monocytes Absolute: 0.3 10*3/uL (ref 0.1–1.0)
NEUTROS ABS: 5.3 10*3/uL (ref 1.7–7.7)
Neutrophils Relative %: 76 %
Platelets: 42 10*3/uL — ABNORMAL LOW (ref 150–400)
RBC: 3.51 MIL/uL — ABNORMAL LOW (ref 3.87–5.11)
RDW: 12.9 % (ref 11.5–15.5)
WBC: 7 10*3/uL (ref 4.0–10.5)

## 2016-06-27 ENCOUNTER — Telehealth: Payer: Self-pay | Admitting: *Deleted

## 2016-06-27 ENCOUNTER — Other Ambulatory Visit: Payer: Self-pay | Admitting: Oncology

## 2016-06-27 NOTE — Telephone Encounter (Signed)
Pt called / informed ok to go her trip to [place].

## 2016-06-27 NOTE — Telephone Encounter (Signed)
OK to travel.   

## 2016-06-27 NOTE — Telephone Encounter (Signed)
-----   Message from Annia Belt, MD sent at 06/27/2016  7:25 AM EST ----- Please let pt know Q 2 week labs scheduled for next 6 weeks to start Wed 07/05/16

## 2016-06-27 NOTE — Telephone Encounter (Signed)
Pt informed to lab schedule per EPIC - every 2 weeks x 6 weeks. Also wants to let u know, she will be going to [place] to visit her aunt in February; leaving on the 22nd - wants to be sure it will be ok.

## 2016-07-05 ENCOUNTER — Other Ambulatory Visit (INDEPENDENT_AMBULATORY_CARE_PROVIDER_SITE_OTHER): Payer: Medicare Other

## 2016-07-05 DIAGNOSIS — R102 Pelvic and perineal pain: Secondary | ICD-10-CM | POA: Diagnosis not present

## 2016-07-05 DIAGNOSIS — D6861 Antiphospholipid syndrome: Secondary | ICD-10-CM

## 2016-07-05 DIAGNOSIS — D6859 Other primary thrombophilia: Secondary | ICD-10-CM

## 2016-07-05 DIAGNOSIS — N3941 Urge incontinence: Secondary | ICD-10-CM | POA: Diagnosis not present

## 2016-07-05 DIAGNOSIS — M6281 Muscle weakness (generalized): Secondary | ICD-10-CM | POA: Diagnosis not present

## 2016-07-05 DIAGNOSIS — D696 Thrombocytopenia, unspecified: Secondary | ICD-10-CM | POA: Diagnosis present

## 2016-07-05 DIAGNOSIS — Z7901 Long term (current) use of anticoagulants: Secondary | ICD-10-CM

## 2016-07-05 DIAGNOSIS — M62838 Other muscle spasm: Secondary | ICD-10-CM | POA: Diagnosis not present

## 2016-07-05 LAB — CBC WITH DIFFERENTIAL/PLATELET
Basophils Absolute: 0 10*3/uL (ref 0.0–0.1)
Basophils Relative: 1 %
EOS PCT: 5 %
Eosinophils Absolute: 0.3 10*3/uL (ref 0.0–0.7)
HCT: 31.4 % — ABNORMAL LOW (ref 36.0–46.0)
Hemoglobin: 10.2 g/dL — ABNORMAL LOW (ref 12.0–15.0)
LYMPHS ABS: 1.1 10*3/uL (ref 0.7–4.0)
LYMPHS PCT: 18 %
MCH: 30.3 pg (ref 26.0–34.0)
MCHC: 32.5 g/dL (ref 30.0–36.0)
MCV: 93.2 fL (ref 78.0–100.0)
MONO ABS: 0.3 10*3/uL (ref 0.1–1.0)
Monocytes Relative: 5 %
Neutro Abs: 4.3 10*3/uL (ref 1.7–7.7)
Neutrophils Relative %: 71 %
PLATELETS: 40 10*3/uL — AB (ref 150–400)
RBC: 3.37 MIL/uL — ABNORMAL LOW (ref 3.87–5.11)
RDW: 13.6 % (ref 11.5–15.5)
WBC: 6.1 10*3/uL (ref 4.0–10.5)

## 2016-07-19 ENCOUNTER — Other Ambulatory Visit (INDEPENDENT_AMBULATORY_CARE_PROVIDER_SITE_OTHER): Payer: Medicare Other

## 2016-07-19 DIAGNOSIS — R102 Pelvic and perineal pain: Secondary | ICD-10-CM | POA: Diagnosis not present

## 2016-07-19 DIAGNOSIS — D6859 Other primary thrombophilia: Secondary | ICD-10-CM

## 2016-07-19 DIAGNOSIS — Z7901 Long term (current) use of anticoagulants: Secondary | ICD-10-CM

## 2016-07-19 DIAGNOSIS — M62838 Other muscle spasm: Secondary | ICD-10-CM | POA: Diagnosis not present

## 2016-07-19 DIAGNOSIS — D696 Thrombocytopenia, unspecified: Secondary | ICD-10-CM

## 2016-07-19 DIAGNOSIS — N3941 Urge incontinence: Secondary | ICD-10-CM | POA: Diagnosis not present

## 2016-07-19 DIAGNOSIS — M6281 Muscle weakness (generalized): Secondary | ICD-10-CM | POA: Diagnosis not present

## 2016-07-19 DIAGNOSIS — D6861 Antiphospholipid syndrome: Secondary | ICD-10-CM

## 2016-07-19 LAB — CBC WITH DIFFERENTIAL/PLATELET
BASOS PCT: 1 %
Basophils Absolute: 0.1 10*3/uL (ref 0.0–0.1)
EOS ABS: 0.3 10*3/uL (ref 0.0–0.7)
EOS PCT: 4 %
HCT: 32.4 % — ABNORMAL LOW (ref 36.0–46.0)
HEMOGLOBIN: 10.2 g/dL — AB (ref 12.0–15.0)
LYMPHS ABS: 1.1 10*3/uL (ref 0.7–4.0)
Lymphocytes Relative: 16 %
MCH: 29.6 pg (ref 26.0–34.0)
MCHC: 31.5 g/dL (ref 30.0–36.0)
MCV: 93.9 fL (ref 78.0–100.0)
Monocytes Absolute: 0.3 10*3/uL (ref 0.1–1.0)
Monocytes Relative: 4 %
NEUTROS PCT: 74 %
Neutro Abs: 4.9 10*3/uL (ref 1.7–7.7)
PLATELETS: 51 10*3/uL — AB (ref 150–400)
RBC: 3.45 MIL/uL — AB (ref 3.87–5.11)
RDW: 13.8 % (ref 11.5–15.5)
WBC: 6.6 10*3/uL (ref 4.0–10.5)

## 2016-08-01 ENCOUNTER — Telehealth: Payer: Self-pay | Admitting: Internal Medicine

## 2016-08-01 NOTE — Telephone Encounter (Signed)
APT. REMINDER CALL, LMTCB °

## 2016-08-02 ENCOUNTER — Other Ambulatory Visit (INDEPENDENT_AMBULATORY_CARE_PROVIDER_SITE_OTHER): Payer: Medicare Other

## 2016-08-02 DIAGNOSIS — D696 Thrombocytopenia, unspecified: Secondary | ICD-10-CM

## 2016-08-02 DIAGNOSIS — R102 Pelvic and perineal pain: Secondary | ICD-10-CM | POA: Diagnosis not present

## 2016-08-02 DIAGNOSIS — M6281 Muscle weakness (generalized): Secondary | ICD-10-CM | POA: Diagnosis not present

## 2016-08-02 DIAGNOSIS — Z950 Presence of cardiac pacemaker: Secondary | ICD-10-CM | POA: Diagnosis not present

## 2016-08-02 DIAGNOSIS — N3941 Urge incontinence: Secondary | ICD-10-CM | POA: Diagnosis not present

## 2016-08-02 DIAGNOSIS — D6861 Antiphospholipid syndrome: Secondary | ICD-10-CM

## 2016-08-02 DIAGNOSIS — Z9889 Other specified postprocedural states: Secondary | ICD-10-CM

## 2016-08-02 DIAGNOSIS — M62838 Other muscle spasm: Secondary | ICD-10-CM | POA: Diagnosis not present

## 2016-08-02 DIAGNOSIS — Z952 Presence of prosthetic heart valve: Secondary | ICD-10-CM | POA: Diagnosis not present

## 2016-08-02 LAB — CBC WITH DIFFERENTIAL/PLATELET
BASOS ABS: 0.1 10*3/uL (ref 0.0–0.1)
Basophils Relative: 1 %
Eosinophils Absolute: 0.3 10*3/uL (ref 0.0–0.7)
Eosinophils Relative: 4 %
HEMATOCRIT: 33.5 % — AB (ref 36.0–46.0)
HEMOGLOBIN: 10.5 g/dL — AB (ref 12.0–15.0)
Lymphocytes Relative: 14 %
Lymphs Abs: 1 10*3/uL (ref 0.7–4.0)
MCH: 29.5 pg (ref 26.0–34.0)
MCHC: 31.3 g/dL (ref 30.0–36.0)
MCV: 94.1 fL (ref 78.0–100.0)
MONO ABS: 0.4 10*3/uL (ref 0.1–1.0)
Monocytes Relative: 5 %
Neutro Abs: 5.3 10*3/uL (ref 1.7–7.7)
Neutrophils Relative %: 76 %
Platelets: 48 10*3/uL — ABNORMAL LOW (ref 150–400)
RBC: 3.56 MIL/uL — ABNORMAL LOW (ref 3.87–5.11)
RDW: 13.4 % (ref 11.5–15.5)
WBC: 6.9 10*3/uL (ref 4.0–10.5)

## 2016-08-03 LAB — COMPREHENSIVE METABOLIC PANEL
ALBUMIN: 4.1 g/dL (ref 3.6–4.8)
ALK PHOS: 142 IU/L — AB (ref 39–117)
ALT: 30 IU/L (ref 0–32)
AST: 29 IU/L (ref 0–40)
Albumin/Globulin Ratio: 1.5 (ref 1.2–2.2)
BUN / CREAT RATIO: 27 (ref 12–28)
BUN: 53 mg/dL — AB (ref 8–27)
Bilirubin Total: 0.4 mg/dL (ref 0.0–1.2)
CO2: 25 mmol/L (ref 18–29)
Calcium: 8.7 mg/dL (ref 8.7–10.3)
Chloride: 101 mmol/L (ref 96–106)
Creatinine, Ser: 1.96 mg/dL — ABNORMAL HIGH (ref 0.57–1.00)
GFR calc Af Amer: 29 mL/min/{1.73_m2} — ABNORMAL LOW (ref >59)
GFR calc non Af Amer: 26 mL/min/{1.73_m2} — ABNORMAL LOW (ref >59)
GLOBULIN, TOTAL: 2.8 g/dL (ref 1.5–4.5)
GLUCOSE: 80 mg/dL (ref 65–99)
Potassium: 4.6 mmol/L (ref 3.5–5.2)
SODIUM: 142 mmol/L (ref 134–144)
Total Protein: 6.9 g/dL (ref 6.0–8.5)

## 2016-08-03 LAB — HEPARIN ANTI-XA: Heparin Anti-Xa: 0.7 IU/mL

## 2016-08-14 ENCOUNTER — Other Ambulatory Visit: Payer: Self-pay

## 2016-08-14 ENCOUNTER — Ambulatory Visit (INDEPENDENT_AMBULATORY_CARE_PROVIDER_SITE_OTHER): Payer: Medicare Other | Admitting: *Deleted

## 2016-08-14 DIAGNOSIS — I495 Sick sinus syndrome: Secondary | ICD-10-CM

## 2016-08-14 MED ORDER — FUROSEMIDE 20 MG PO TABS: 20.0000 mg | ORAL_TABLET | Freq: Every day | ORAL | 1 refills | Status: DC

## 2016-08-15 ENCOUNTER — Encounter: Payer: Self-pay | Admitting: Cardiology

## 2016-08-15 LAB — CUP PACEART REMOTE DEVICE CHECK
Battery Remaining Longevity: 71 mo
Date Time Interrogation Session: 20180219073850
Implantable Lead Implant Date: 20131003
Implantable Lead Implant Date: 20131003
Implantable Lead Location: 753858
Implantable Lead Location: 753859
Lead Channel Impedance Value: 1000 Ohm
Lead Channel Pacing Threshold Amplitude: 0.5 V
Lead Channel Pacing Threshold Amplitude: 0.75 V
Lead Channel Sensing Intrinsic Amplitude: 7.6 mV
Lead Channel Setting Pacing Amplitude: 2 V
Lead Channel Setting Pacing Pulse Width: 0.5 ms
MDC IDC LEAD IMPLANT DT: 20131003
MDC IDC LEAD LOCATION: 753860
MDC IDC MSMT BATTERY REMAINING PERCENTAGE: 73 %
MDC IDC MSMT BATTERY VOLTAGE: 2.92 V
MDC IDC MSMT LEADCHNL LV PACING THRESHOLD PULSEWIDTH: 0.8 ms
MDC IDC MSMT LEADCHNL RV IMPEDANCE VALUE: 350 Ohm
MDC IDC MSMT LEADCHNL RV PACING THRESHOLD PULSEWIDTH: 0.5 ms
MDC IDC PG IMPLANT DT: 20131003
MDC IDC PG SERIAL: 2786353
MDC IDC SET LEADCHNL LV PACING PULSEWIDTH: 0.8 ms
MDC IDC SET LEADCHNL RV PACING AMPLITUDE: 2 V
MDC IDC SET LEADCHNL RV SENSING SENSITIVITY: 2 mV
Pulse Gen Model: 3210

## 2016-08-15 NOTE — Progress Notes (Signed)
Remote pacemaker transmission.   

## 2016-08-16 ENCOUNTER — Other Ambulatory Visit (INDEPENDENT_AMBULATORY_CARE_PROVIDER_SITE_OTHER): Payer: Medicare Other

## 2016-08-16 DIAGNOSIS — Z7901 Long term (current) use of anticoagulants: Secondary | ICD-10-CM | POA: Diagnosis not present

## 2016-08-16 DIAGNOSIS — D6861 Antiphospholipid syndrome: Secondary | ICD-10-CM

## 2016-08-16 DIAGNOSIS — D696 Thrombocytopenia, unspecified: Secondary | ICD-10-CM

## 2016-08-16 DIAGNOSIS — D6859 Other primary thrombophilia: Secondary | ICD-10-CM

## 2016-08-17 LAB — CBC WITH DIFFERENTIAL/PLATELET
BASOS ABS: 0 10*3/uL (ref 0.0–0.2)
Basos: 1 %
EOS (ABSOLUTE): 0.3 10*3/uL (ref 0.0–0.4)
Eos: 5 %
HEMATOCRIT: 28.9 % — AB (ref 34.0–46.6)
Hemoglobin: 9.8 g/dL — ABNORMAL LOW (ref 11.1–15.9)
IMMATURE GRANS (ABS): 0 10*3/uL (ref 0.0–0.1)
IMMATURE GRANULOCYTES: 0 %
LYMPHS: 15 %
Lymphocytes Absolute: 0.8 10*3/uL (ref 0.7–3.1)
MCH: 30.7 pg (ref 26.6–33.0)
MCHC: 33.9 g/dL (ref 31.5–35.7)
MCV: 91 fL (ref 79–97)
MONOS ABS: 0.4 10*3/uL (ref 0.1–0.9)
Monocytes: 7 %
NEUTROS PCT: 72 %
Neutrophils Absolute: 3.9 10*3/uL (ref 1.4–7.0)
PLATELETS: 36 10*3/uL — AB (ref 150–379)
RBC: 3.19 x10E6/uL — ABNORMAL LOW (ref 3.77–5.28)
RDW: 14.2 % (ref 12.3–15.4)
WBC: 5.4 10*3/uL (ref 3.4–10.8)

## 2016-08-21 ENCOUNTER — Other Ambulatory Visit: Payer: Self-pay | Admitting: Oncology

## 2016-08-21 DIAGNOSIS — D6859 Other primary thrombophilia: Secondary | ICD-10-CM

## 2016-08-21 DIAGNOSIS — D696 Thrombocytopenia, unspecified: Secondary | ICD-10-CM

## 2016-08-21 DIAGNOSIS — Z7901 Long term (current) use of anticoagulants: Secondary | ICD-10-CM

## 2016-08-21 DIAGNOSIS — D6861 Antiphospholipid syndrome: Secondary | ICD-10-CM

## 2016-08-23 ENCOUNTER — Telehealth: Payer: Self-pay | Admitting: Internal Medicine

## 2016-08-23 NOTE — Telephone Encounter (Signed)
APT. REMINDER CALL, LMTCB °

## 2016-08-24 ENCOUNTER — Other Ambulatory Visit (INDEPENDENT_AMBULATORY_CARE_PROVIDER_SITE_OTHER): Payer: Medicare Other

## 2016-08-24 DIAGNOSIS — Z7901 Long term (current) use of anticoagulants: Secondary | ICD-10-CM

## 2016-08-24 DIAGNOSIS — D696 Thrombocytopenia, unspecified: Secondary | ICD-10-CM | POA: Diagnosis not present

## 2016-08-24 DIAGNOSIS — D6861 Antiphospholipid syndrome: Secondary | ICD-10-CM

## 2016-08-24 DIAGNOSIS — D6859 Other primary thrombophilia: Secondary | ICD-10-CM | POA: Diagnosis present

## 2016-08-24 LAB — CBC WITH DIFFERENTIAL/PLATELET
BASOS ABS: 0.1 10*3/uL (ref 0.0–0.1)
BASOS PCT: 1 %
EOS PCT: 7 %
Eosinophils Absolute: 0.4 10*3/uL (ref 0.0–0.7)
HEMATOCRIT: 31.6 % — AB (ref 36.0–46.0)
Hemoglobin: 9.9 g/dL — ABNORMAL LOW (ref 12.0–15.0)
Lymphocytes Relative: 22 %
Lymphs Abs: 1.2 10*3/uL (ref 0.7–4.0)
MCH: 29.3 pg (ref 26.0–34.0)
MCHC: 31.3 g/dL (ref 30.0–36.0)
MCV: 93.5 fL (ref 78.0–100.0)
MONO ABS: 0.3 10*3/uL (ref 0.1–1.0)
MONOS PCT: 6 %
Neutro Abs: 3.6 10*3/uL (ref 1.7–7.7)
Neutrophils Relative %: 64 %
PLATELETS: 44 10*3/uL — AB (ref 150–400)
RBC: 3.38 MIL/uL — ABNORMAL LOW (ref 3.87–5.11)
RDW: 13.6 % (ref 11.5–15.5)
WBC: 5.6 10*3/uL (ref 4.0–10.5)

## 2016-08-30 ENCOUNTER — Other Ambulatory Visit: Payer: Medicare Other

## 2016-09-05 ENCOUNTER — Ambulatory Visit: Payer: Medicare Other | Admitting: Oncology

## 2016-09-06 ENCOUNTER — Other Ambulatory Visit (INDEPENDENT_AMBULATORY_CARE_PROVIDER_SITE_OTHER): Payer: Medicare Other

## 2016-09-06 DIAGNOSIS — H2513 Age-related nuclear cataract, bilateral: Secondary | ICD-10-CM | POA: Diagnosis not present

## 2016-09-06 DIAGNOSIS — Z7901 Long term (current) use of anticoagulants: Secondary | ICD-10-CM

## 2016-09-06 DIAGNOSIS — H5203 Hypermetropia, bilateral: Secondary | ICD-10-CM | POA: Diagnosis not present

## 2016-09-06 DIAGNOSIS — D696 Thrombocytopenia, unspecified: Secondary | ICD-10-CM

## 2016-09-06 DIAGNOSIS — D6861 Antiphospholipid syndrome: Secondary | ICD-10-CM

## 2016-09-06 DIAGNOSIS — D6859 Other primary thrombophilia: Secondary | ICD-10-CM

## 2016-09-06 LAB — CBC WITH DIFFERENTIAL/PLATELET
Basophils Absolute: 0.1 10*3/uL (ref 0.0–0.1)
Basophils Relative: 1 %
EOS ABS: 0.4 10*3/uL (ref 0.0–0.7)
EOS PCT: 7 %
HCT: 32.4 % — ABNORMAL LOW (ref 36.0–46.0)
HEMOGLOBIN: 10.4 g/dL — AB (ref 12.0–15.0)
Lymphocytes Relative: 16 %
Lymphs Abs: 1 10*3/uL (ref 0.7–4.0)
MCH: 29.5 pg (ref 26.0–34.0)
MCHC: 32.1 g/dL (ref 30.0–36.0)
MCV: 92 fL (ref 78.0–100.0)
MONO ABS: 0.4 10*3/uL (ref 0.1–1.0)
MONOS PCT: 6 %
NEUTROS PCT: 70 %
Neutro Abs: 4.4 10*3/uL (ref 1.7–7.7)
Platelets: 37 10*3/uL — ABNORMAL LOW (ref 150–400)
RBC: 3.52 MIL/uL — ABNORMAL LOW (ref 3.87–5.11)
RDW: 13.6 % (ref 11.5–15.5)
WBC: 6.3 10*3/uL (ref 4.0–10.5)

## 2016-09-11 DIAGNOSIS — R102 Pelvic and perineal pain: Secondary | ICD-10-CM | POA: Diagnosis not present

## 2016-09-11 DIAGNOSIS — M62838 Other muscle spasm: Secondary | ICD-10-CM | POA: Diagnosis not present

## 2016-09-11 DIAGNOSIS — M6281 Muscle weakness (generalized): Secondary | ICD-10-CM | POA: Diagnosis not present

## 2016-09-11 DIAGNOSIS — N3941 Urge incontinence: Secondary | ICD-10-CM | POA: Diagnosis not present

## 2016-09-21 ENCOUNTER — Other Ambulatory Visit: Payer: Self-pay | Admitting: Oncology

## 2016-09-21 ENCOUNTER — Telehealth: Payer: Self-pay | Admitting: *Deleted

## 2016-09-21 DIAGNOSIS — D6861 Antiphospholipid syndrome: Secondary | ICD-10-CM

## 2016-09-21 DIAGNOSIS — Z7901 Long term (current) use of anticoagulants: Secondary | ICD-10-CM

## 2016-09-21 DIAGNOSIS — D696 Thrombocytopenia, unspecified: Secondary | ICD-10-CM

## 2016-09-21 NOTE — Telephone Encounter (Signed)
-----   Message from Annia Belt, MD sent at 09/21/2016  2:00 PM EDT ----- Lab put in for Tues 4/10 ----- Message ----- From: [name], RN Sent: 09/21/2016   8:42 AM To: Annia Belt, MD  Call from pt - wants to know when is it time for next labs.She will be out of town 4/14 thru the weekend. [name]

## 2016-09-21 NOTE — Telephone Encounter (Signed)
Called pt - no answer; left message lab appt Tues 4/10 @ 1100 AM and to call back if need to change date and/or time.

## 2016-09-21 NOTE — Telephone Encounter (Signed)
Call from pt - wants to know when is it time for next labs.She will be out of town 4/14 thru the weekend. Thanks

## 2016-10-03 ENCOUNTER — Other Ambulatory Visit (INDEPENDENT_AMBULATORY_CARE_PROVIDER_SITE_OTHER): Payer: Medicare Other

## 2016-10-03 DIAGNOSIS — Z952 Presence of prosthetic heart valve: Secondary | ICD-10-CM | POA: Diagnosis not present

## 2016-10-03 DIAGNOSIS — D696 Thrombocytopenia, unspecified: Secondary | ICD-10-CM | POA: Diagnosis not present

## 2016-10-03 DIAGNOSIS — D6861 Antiphospholipid syndrome: Secondary | ICD-10-CM

## 2016-10-03 DIAGNOSIS — Z950 Presence of cardiac pacemaker: Secondary | ICD-10-CM

## 2016-10-03 DIAGNOSIS — Z9889 Other specified postprocedural states: Secondary | ICD-10-CM

## 2016-10-04 LAB — COMPREHENSIVE METABOLIC PANEL
A/G RATIO: 1.5 (ref 1.2–2.2)
ALBUMIN: 3.8 g/dL (ref 3.5–4.8)
ALT: 19 IU/L (ref 0–32)
AST: 28 IU/L (ref 0–40)
Alkaline Phosphatase: 117 IU/L (ref 39–117)
BILIRUBIN TOTAL: 0.6 mg/dL (ref 0.0–1.2)
BUN / CREAT RATIO: 21 (ref 12–28)
BUN: 43 mg/dL — ABNORMAL HIGH (ref 8–27)
CALCIUM: 8.7 mg/dL (ref 8.7–10.3)
CHLORIDE: 99 mmol/L (ref 96–106)
CO2: 24 mmol/L (ref 18–29)
Creatinine, Ser: 2.09 mg/dL — ABNORMAL HIGH (ref 0.57–1.00)
GFR, EST AFRICAN AMERICAN: 27 mL/min/{1.73_m2} — AB (ref >59)
GFR, EST NON AFRICAN AMERICAN: 23 mL/min/{1.73_m2} — AB (ref >59)
GLOBULIN, TOTAL: 2.5 g/dL (ref 1.5–4.5)
Glucose: 84 mg/dL (ref 65–99)
POTASSIUM: 4.6 mmol/L (ref 3.5–5.2)
Sodium: 140 mmol/L (ref 134–144)
TOTAL PROTEIN: 6.3 g/dL (ref 6.0–8.5)

## 2016-10-04 LAB — CBC WITH DIFFERENTIAL/PLATELET
BASOS: 1 %
Basophils Absolute: 0.1 10*3/uL (ref 0.0–0.2)
EOS (ABSOLUTE): 0.3 10*3/uL (ref 0.0–0.4)
EOS: 5 %
HEMATOCRIT: 28.1 % — AB (ref 34.0–46.6)
HEMOGLOBIN: 9.6 g/dL — AB (ref 11.1–15.9)
IMMATURE GRANS (ABS): 0 10*3/uL (ref 0.0–0.1)
Immature Granulocytes: 0 %
LYMPHS ABS: 1 10*3/uL (ref 0.7–3.1)
LYMPHS: 15 %
MCH: 29.8 pg (ref 26.6–33.0)
MCHC: 34.2 g/dL (ref 31.5–35.7)
MCV: 87 fL (ref 79–97)
MONOCYTES: 6 %
Monocytes Absolute: 0.4 10*3/uL (ref 0.1–0.9)
NEUTROS ABS: 4.8 10*3/uL (ref 1.4–7.0)
Neutrophils: 73 %
Platelets: 22 10*3/uL — CL (ref 150–379)
RBC: 3.22 x10E6/uL — ABNORMAL LOW (ref 3.77–5.28)
RDW: 13.8 % (ref 12.3–15.4)
WBC: 6.5 10*3/uL (ref 3.4–10.8)

## 2016-10-04 LAB — HEPARIN ANTI-XA: Heparin Anti-Xa: 0.6 IU/mL

## 2016-10-05 ENCOUNTER — Encounter: Payer: Self-pay | Admitting: Oncology

## 2016-10-05 ENCOUNTER — Other Ambulatory Visit: Payer: Self-pay | Admitting: Oncology

## 2016-10-05 DIAGNOSIS — D693 Immune thrombocytopenic purpura: Secondary | ICD-10-CM

## 2016-10-05 MED ORDER — PANTOPRAZOLE SODIUM 40 MG PO TBEC: 40.0000 mg | DELAYED_RELEASE_TABLET | Freq: Every day | ORAL | 1 refills | Status: DC

## 2016-10-05 MED ORDER — DEXAMETHASONE 4 MG PO TABS: 20.0000 mg | ORAL_TABLET | Freq: Two times a day (BID) | ORAL | 1 refills | Status: AC

## 2016-10-05 NOTE — Progress Notes (Signed)
Platelet count has fallen to a critical value of 22,000. She is on therapeutic anticoagulation in view of mechanical MVR. I will begin a course of high dose dexamethasone 20 mg BID x 4 days; protonix 40 mg QD.

## 2016-10-09 ENCOUNTER — Telehealth: Payer: Self-pay | Admitting: *Deleted

## 2016-10-09 ENCOUNTER — Encounter: Payer: Self-pay | Admitting: Oncology

## 2016-10-09 ENCOUNTER — Ambulatory Visit (INDEPENDENT_AMBULATORY_CARE_PROVIDER_SITE_OTHER): Payer: Medicare Other | Admitting: Oncology

## 2016-10-09 VITALS — BP 136/71 | HR 84 | Temp 98.0°F | Ht 63.0 in | Wt 167.2 lb

## 2016-10-09 DIAGNOSIS — N184 Chronic kidney disease, stage 4 (severe): Secondary | ICD-10-CM

## 2016-10-09 DIAGNOSIS — Z950 Presence of cardiac pacemaker: Secondary | ICD-10-CM

## 2016-10-09 DIAGNOSIS — Z7901 Long term (current) use of anticoagulants: Secondary | ICD-10-CM | POA: Diagnosis not present

## 2016-10-09 DIAGNOSIS — D6861 Antiphospholipid syndrome: Secondary | ICD-10-CM

## 2016-10-09 DIAGNOSIS — D696 Thrombocytopenia, unspecified: Secondary | ICD-10-CM

## 2016-10-09 DIAGNOSIS — D693 Immune thrombocytopenic purpura: Secondary | ICD-10-CM | POA: Diagnosis present

## 2016-10-09 DIAGNOSIS — Z952 Presence of prosthetic heart valve: Secondary | ICD-10-CM

## 2016-10-09 HISTORY — DX: Immune thrombocytopenic purpura: D69.3

## 2016-10-09 MED ORDER — ZOLPIDEM TARTRATE 5 MG PO TABS: 5.0000 mg | ORAL_TABLET | Freq: Every evening | ORAL | 0 refills | Status: DC | PRN

## 2016-10-09 NOTE — Progress Notes (Signed)
Hematology and Oncology Follow Up Visit  [name] [id number] 1946/03/23 71 y.o. 10/09/2016 4:59 PM   Principle Diagnosis: Encounter Diagnoses  Name Primary?  . Thrombocytopenia ([place]) Yes  . Antiphospholipid antibody with hypercoagulable state ([place])   . Chronic anticoagulation      Interim History:  Brief visit to discuss acute treatment of ITP. [name] has underlying antiphospholipid antibody syndrome for many years.  It has already caused destruction of her mitral valve requiring emergency replacement.  Possibly related to chronic refractory atrial arrhythmias now status post pacemaker.  She is always run a slightly low platelet count with previous baseline around 100,000.  Last year platelet count drifted down into the 70-80,000 range.  This year further decrease in the 40-50,000 range. She has a mechanical mitral valve.  Although haptoglobin is low and LDH is borderline elevated as expected from chronic low-grade hemolysis off the valve, parameters for hemolysis have not changed in review of the peripheral blood film does not show significant schistocytosis.  I believe her thrombocytopenia is likely immune related.  We have had previous discussions about initiating treatment of her platelet count fell below 30,000.  She was in last week and platelet count which was hovering around 40,000 fell to 22,000 on April 10.  I called her with these results.  She was visiting a close friend in [place].  I called in a prescription for dexamethasone 20 mg twice daily 4 days which she just started this morning.  Protonix to prevent ulcers.  Ambien 5 mg at bedtime in case she gets significant insomnia.  She denies any headache or change in vision.  No bruising, no clinical bleeding, no hematuria or hematochezia.     Medications: reviewed  Allergies:  Allergies  Allergen Reactions  . Amiodarone Hcl Other (See Comments)    "torsades; v tach"  . 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  Vitamin K Anaphylaxis    IV only allergy  . Iodinated Diagnostic Agents Itching    "over 35 years ago" (03/28/2012)    Review of Systems: See interim history Remaining ROS negative:   Physical Exam: Blood pressure 136/71, pulse 84, temperature 98 F (36.7 C), temperature source Oral, height 5\' 3"  (1.6 m), weight 167 lb 3.2 oz (75.8 kg), SpO2 98 %. Wt Readings from Last 3 Encounters:  10/09/16 167 lb 3.2 oz (75.8 kg)  06/12/16 162 lb 4.8 oz (73.6 kg)  02/14/16 160 lb 3.2 oz (72.7 kg)     General appearance: Well-nourished Caucasian woman   Lungs: Clear to auscultation, resonant to percussion throughout Heart: Regular rhythm, mechanical heart valve sounds, no  murmur, no gallop, no rub, no click, no edema Neurologic: Alert, oriented, PERRLA, optic discs chronically blurred but vessels normal, no hemorrhage or exudate, cranial nerves grossly normal, motor strength 5 over 5, reflexes 1+ symmetric, upper body coordination normal, gait normal, Skin: No rash or ecchymosis  Lab Results: CBC W/Diff    Component Value Date/Time   WBC 6.5 10/03/2016 1114   WBC 6.3 09/06/2016 1106   RBC 3.22 (L) 10/03/2016 1114   RBC 3.52 (L) 09/06/2016 1106   HGB 10.4 (L) 09/06/2016 1106   HGB 10.7 (L) 08/02/2015 1315   HCT 28.1 (L) 10/03/2016 1114   HCT 33.2 (L) 08/02/2015 1315   PLT 22 (LL) 10/03/2016 1114   MCV 87 10/03/2016 1114   MCV 93.8 08/02/2015 1315   MCH 29.8 10/03/2016 1114   MCH 29.5 09/06/2016 1106   MCHC 34.2  10/03/2016 1114   MCHC 32.1 09/06/2016 1106   RDW 13.8 10/03/2016 1114   RDW 12.7 08/02/2015 1315   LYMPHSABS 1.0 10/03/2016 1114   LYMPHSABS 0.9 08/02/2015 1315   MONOABS 0.4 09/06/2016 1106   MONOABS 0.4 08/02/2015 1315   EOSABS 0.3 10/03/2016 1114   BASOSABS 0.1 10/03/2016 1114   BASOSABS 0.1 08/02/2015 1315     Chemistry      Component Value Date/Time   NA 140 10/03/2016 1114   NA 142 08/02/2015 1315   K 4.6 10/03/2016 1114   K 4.4 08/02/2015 1315   CL 99  10/03/2016 1114   CL 108 (H) 11/04/2012 1158   CO2 24 10/03/2016 1114   CO2 25 08/02/2015 1315   BUN 43 (H) 10/03/2016 1114   BUN 62.9 (H) 08/02/2015 1315   CREATININE 2.09 (H) 10/03/2016 1114   CREATININE 1.9 (H) 08/02/2015 1315      Component Value Date/Time   CALCIUM 8.7 10/03/2016 1114   CALCIUM 9.1 08/02/2015 1315   ALKPHOS 117 10/03/2016 1114   ALKPHOS 103 08/02/2015 1315   AST 28 10/03/2016 1114   AST 36 (H) 08/02/2015 1315   ALT 19 10/03/2016 1114   ALT 31 08/02/2015 1315   BILITOT 0.6 10/03/2016 1114   BILITOT 0.60 08/02/2015 1315       Radiological Studies: No results found.  Impression:  #1.  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  She will get a pulse of high-dose dexamethasone.  Check platelet count in 2 weeks.  If no significant response, then a second pulse. I will reevaluate parameters of hemolysis and review a current blood film.  2.  Chronic anticoagulation with Arixtra in view of previous heparin induced thrombocytopenia without thrombosis and recurrent subdural hematoma on warfarin.  3.  Chronic renal insufficiency slowly progressive with current creatinine at 2.  Now stage IV CKD.  All of the above are risk factors for bleeding.  I will continue to monitor closely.  CC: Patient Care Team: Josetta Huddle, MD as PCP - General (Internal Medicine) Annia Belt, MD as Consulting Physician (Oncology) Jamal Maes, MD as Consulting Physician (Nephrology) Lelon Perla, MD as Consulting Physician (Cardiology) Thompson Grayer, MD as Consulting Physician (Cardiology)   Murriel Hopper, MD, [place]  Hematology-Oncology/Internal Medicine     4/16/20184:59 PM

## 2016-10-09 NOTE — Telephone Encounter (Signed)
Message left per pt - states she has been started on a new medication which she plans to start this morning. Requesting an appt ; has concerns with her other health problems while taking new medication.  Talked to Dr Darnell Level - stated pt can come today 3PM or 3:30 PM. I called pt - stated was told not drive,but if she can, she will come today.  Dr Darnell Level stated ok to drive. Called pt back - informed she can drive; will be here @ 3PM.

## 2016-10-09 NOTE — Patient Instructions (Addendum)
Return for lab in 2 weeks on Monday, 4/30 MD visit 5/14 lab same day

## 2016-10-20 ENCOUNTER — Telehealth: Payer: Self-pay | Admitting: Internal Medicine

## 2016-10-20 NOTE — Telephone Encounter (Signed)
APT. REMINDER CALL, LMTCB °

## 2016-10-23 ENCOUNTER — Other Ambulatory Visit (INDEPENDENT_AMBULATORY_CARE_PROVIDER_SITE_OTHER): Payer: Medicare Other

## 2016-10-23 DIAGNOSIS — D6861 Antiphospholipid syndrome: Secondary | ICD-10-CM

## 2016-10-23 DIAGNOSIS — Z7901 Long term (current) use of anticoagulants: Secondary | ICD-10-CM

## 2016-10-23 DIAGNOSIS — D696 Thrombocytopenia, unspecified: Secondary | ICD-10-CM | POA: Diagnosis present

## 2016-10-23 LAB — BASIC METABOLIC PANEL
Anion gap: 8 (ref 5–15)
BUN: 55 mg/dL — AB (ref 6–20)
CHLORIDE: 104 mmol/L (ref 101–111)
CO2: 26 mmol/L (ref 22–32)
CREATININE: 2.32 mg/dL — AB (ref 0.44–1.00)
Calcium: 8.8 mg/dL — ABNORMAL LOW (ref 8.9–10.3)
GFR calc Af Amer: 23 mL/min — ABNORMAL LOW (ref >60)
GFR calc non Af Amer: 20 mL/min — ABNORMAL LOW (ref >60)
GLUCOSE: 95 mg/dL (ref 65–99)
POTASSIUM: 4.5 mmol/L (ref 3.5–5.1)
Sodium: 138 mmol/L (ref 135–145)

## 2016-10-23 LAB — SAVE SMEAR

## 2016-10-23 LAB — CBC WITH DIFFERENTIAL/PLATELET
BASOS PCT: 0 %
Basophils Absolute: 0 10*3/uL (ref 0.0–0.1)
EOS ABS: 0.4 10*3/uL (ref 0.0–0.7)
EOS PCT: 3 %
HCT: 31.3 % — ABNORMAL LOW (ref 36.0–46.0)
Hemoglobin: 10 g/dL — ABNORMAL LOW (ref 12.0–15.0)
LYMPHS ABS: 1.4 10*3/uL (ref 0.7–4.0)
Lymphocytes Relative: 13 %
MCH: 29.2 pg (ref 26.0–34.0)
MCHC: 31.9 g/dL (ref 30.0–36.0)
MCV: 91.3 fL (ref 78.0–100.0)
MONOS PCT: 5 %
Monocytes Absolute: 0.5 10*3/uL (ref 0.1–1.0)
NEUTROS PCT: 79 %
Neutro Abs: 8.3 10*3/uL — ABNORMAL HIGH (ref 1.7–7.7)
PLATELETS: 44 10*3/uL — AB (ref 150–400)
RBC: 3.43 MIL/uL — AB (ref 3.87–5.11)
RDW: 13.8 % (ref 11.5–15.5)
WBC: 10.5 10*3/uL (ref 4.0–10.5)

## 2016-10-23 LAB — RETICULOCYTES
RBC.: 3.43 MIL/uL — AB (ref 3.87–5.11)
RETIC CT PCT: 0.6 % (ref 0.4–3.1)
Retic Count, Absolute: 20.6 10*3/uL (ref 19.0–186.0)

## 2016-10-23 LAB — LACTATE DEHYDROGENASE: LDH: 273 U/L — ABNORMAL HIGH (ref 98–192)

## 2016-10-23 NOTE — Addendum Note (Signed)
Addended by: Truddie Crumble on: 10/23/2016 10:47 AM   Modules accepted: Orders

## 2016-10-23 NOTE — Addendum Note (Signed)
Addended by: Truddie Crumble on: 10/23/2016 10:48 AM   Modules accepted: Orders

## 2016-11-06 ENCOUNTER — Ambulatory Visit (INDEPENDENT_AMBULATORY_CARE_PROVIDER_SITE_OTHER): Payer: Medicare Other | Admitting: Oncology

## 2016-11-06 ENCOUNTER — Encounter: Payer: Self-pay | Admitting: Oncology

## 2016-11-06 VITALS — BP 116/65 | HR 84 | Temp 97.4°F | Ht 63.0 in | Wt 161.1 lb

## 2016-11-06 DIAGNOSIS — I428 Other cardiomyopathies: Secondary | ICD-10-CM | POA: Diagnosis not present

## 2016-11-06 DIAGNOSIS — Z87891 Personal history of nicotine dependence: Secondary | ICD-10-CM | POA: Diagnosis not present

## 2016-11-06 DIAGNOSIS — N184 Chronic kidney disease, stage 4 (severe): Secondary | ICD-10-CM | POA: Diagnosis not present

## 2016-11-06 DIAGNOSIS — D696 Thrombocytopenia, unspecified: Secondary | ICD-10-CM | POA: Diagnosis present

## 2016-11-06 DIAGNOSIS — D6861 Antiphospholipid syndrome: Secondary | ICD-10-CM | POA: Diagnosis not present

## 2016-11-06 DIAGNOSIS — Z91041 Radiographic dye allergy status: Secondary | ICD-10-CM | POA: Diagnosis not present

## 2016-11-06 DIAGNOSIS — D693 Immune thrombocytopenic purpura: Secondary | ICD-10-CM

## 2016-11-06 DIAGNOSIS — Z8679 Personal history of other diseases of the circulatory system: Secondary | ICD-10-CM | POA: Diagnosis not present

## 2016-11-06 DIAGNOSIS — I69354 Hemiplegia and hemiparesis following cerebral infarction affecting left non-dominant side: Secondary | ICD-10-CM | POA: Diagnosis not present

## 2016-11-06 DIAGNOSIS — Z7901 Long term (current) use of anticoagulants: Secondary | ICD-10-CM | POA: Diagnosis not present

## 2016-11-06 DIAGNOSIS — E039 Hypothyroidism, unspecified: Secondary | ICD-10-CM | POA: Diagnosis not present

## 2016-11-06 DIAGNOSIS — Z888 Allergy status to other drugs, medicaments and biological substances status: Secondary | ICD-10-CM

## 2016-11-06 DIAGNOSIS — Z950 Presence of cardiac pacemaker: Secondary | ICD-10-CM

## 2016-11-06 LAB — CBC WITH DIFFERENTIAL/PLATELET
BASOS ABS: 0.1 10*3/uL (ref 0.0–0.1)
Basophils Relative: 1 %
Eosinophils Absolute: 0.4 10*3/uL (ref 0.0–0.7)
Eosinophils Relative: 8 %
HEMATOCRIT: 30.8 % — AB (ref 36.0–46.0)
Hemoglobin: 10 g/dL — ABNORMAL LOW (ref 12.0–15.0)
LYMPHS ABS: 0.8 10*3/uL (ref 0.7–4.0)
LYMPHS PCT: 15 %
MCH: 29.3 pg (ref 26.0–34.0)
MCHC: 32.5 g/dL (ref 30.0–36.0)
MCV: 90.3 fL (ref 78.0–100.0)
MONO ABS: 0.4 10*3/uL (ref 0.1–1.0)
MONOS PCT: 6 %
NEUTROS ABS: 3.9 10*3/uL (ref 1.7–7.7)
Neutrophils Relative %: 70 %
Platelets: 23 10*3/uL — CL (ref 150–400)
RBC: 3.41 MIL/uL — ABNORMAL LOW (ref 3.87–5.11)
RDW: 14.2 % (ref 11.5–15.5)
WBC: 5.6 10*3/uL (ref 4.0–10.5)

## 2016-11-06 NOTE — Patient Instructions (Signed)
Repeat 4 day course of decadron 20 mg twice daily CBC Every week until further instruction MD visit 3 weeks

## 2016-11-06 NOTE — Progress Notes (Signed)
Hematology and Oncology Follow Up Visit  [name] [id number] 02/06/1946 71 y.o. 11/06/2016 6:22 PM   Principle Diagnosis: Encounter Diagnoses  Name Primary?  . Thrombocytopenia ([place])   . Antiphospholipid antibody with hypercoagulable state ([place])   . Chronic anticoagulation   . Acute ITP ([place]) Yes  Clinical summary: Please see multiple prior notes.  New problem is acute on chronic thrombocytopenia. 2 years ago platelet count in the chronic 80- attributed to her underlying antiphospholipid antibody syndrome 80-90000 range.  She drifted down into the 40-60000 range last year with further trend down over the last 4 months.  Count down to 22,000 on April 10.  Trial of high-dose dexamethasone 40 mg 4 days started on April 12.  Interim History:   Situation remains complex.  Repeat platelet count today is 23,000.  Only transient response to high-dose dexamethasone 40 mg 4 days with rising platelet count to 44,000 on April 30.  She tolerated the dexamethasone poorly.  She developed intractable diarrhea which lasted for 6 days not responsive to Imodium.  She developed what she describes as a enlarged size foul-smelling lesion under her right breast and a another smaller skin lesion in her left inguinal area.  In addition, she had an exaggerated cutaneous reaction to an insect bite on her right wrist. Fortunately despite the low platelet count and chronic full dose anticoagulation, she has not had any significant bleeding and denies epistaxis, hematuria, hematochezia, melena, or vaginal bleeding.  Hemoglobin remained stable at 10 g.  Medications: Current Outpatient Prescriptions:  .  acetaminophen (TYLENOL) 650 MG CR tablet, Take 1,300 mg by mouth 3 (three) times daily as needed for pain. , Disp: , Rfl:  .  AMOXICILLIN PO, Take 4 capsules by mouth as directed. ONLY WHEN VISITING DENTIST, Disp: , Rfl:  .  Calcium Citrate (CITRACAL PO), Take 1 tablet by mouth daily., Disp: , Rfl:  .   Cholecalciferol (VITAMIN D) 2000 UNITS tablet, Take 2,000 Units by mouth daily.  , Disp: , Rfl:  .  Coenzyme Q-10 100 MG capsule, Take 100 mg by mouth daily., Disp: , Rfl:  .  Estradiol Acetate (FEMRING) 0.05 MG/24HR RING, Place 1 each vaginally as directed. , Disp: , Rfl:  .  fluticasone (FLONASE) 50 MCG/ACT nasal spray, Place 2 sprays into both nostrils daily., Disp: , Rfl:  .  fondaparinux (ARIXTRA) 2.5 MG/0.5ML SOLN injection, INJECT 0.5ML INTO THE SKIN EVERY OTHER DAY, Disp: 9.5 mL, Rfl: 11 .  furosemide (LASIX) 20 MG tablet, Take 1 tablet (20 mg total) by mouth daily., Disp: 90 tablet, Rfl: 1 .  levothyroxine (SYNTHROID, LEVOTHROID) 88 MCG tablet, Take 88 mcg by mouth daily before breakfast., Disp: , Rfl:  .  metoprolol succinate (TOPROL-XL) 50 MG 24 hr tablet, Take 1 tablet (50 mg total) by mouth 2 (two) times daily. Take with or immediately following a meal., Disp: 180 tablet, Rfl: 3 .  Misc Natural Products (TART CHERRY ADVANCED PO), Take 2 tablets by mouth daily., Disp: , Rfl:  .  Omega-3 Fatty Acids (OMEGA-3 FISH OIL) 500 MG CAPS, Take 1 capsule by mouth daily., Disp: , Rfl:  .  pantoprazole (PROTONIX) 40 MG tablet, Take 1 tablet (40 mg total) by mouth daily., Disp: 30 tablet, Rfl: 1 .  valACYclovir (VALTREX) 1000 MG tablet, Take 1,000 mg by mouth 2 (two) times daily. For a total of 3 doses for labial herpes prn only, Disp: , Rfl:  .  zolpidem (AMBIEN) 5 MG tablet, Take 1 tablet (5  mg total) by mouth at bedtime as needed for sleep., Disp: 30 tablet, Rfl: 0  Allergies:  Allergies  Allergen Reactions  . Amiodarone Hcl Other (See Comments)    "torsades; v tach"  . 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 Vitamin K Anaphylaxis    IV only allergy  . Iodinated Diagnostic Agents Itching    "over 35 years ago" (03/28/2012)    Review of Systems: See interim history Remaining ROS negative:   Physical Exam: Blood pressure 116/65, pulse 84, temperature 97.4 F (36.3 C), temperature  source Oral, height 5\' 3"  (1.6 m), weight 161 lb 1.6 oz (73.1 kg), SpO2 98 %. Wt Readings from Last 3 Encounters:  11/06/16 161 lb 1.6 oz (73.1 kg)  10/09/16 167 lb 3.2 oz (75.8 kg)  06/12/16 162 lb 4.8 oz (73.6 kg)     General appearance: Well-nourished Caucasian woman HENNT: Pharynx no erythema, exudate, mass, or ulcer. No thyromegaly or thyroid nodules Lymph nodes: No cervical, supraclavicular, or axillary lymphadenopathy Breasts: Circular rash under the right breast now barely visible. Lungs: Clear to auscultation, resonant to percussion throughout Heart: Regular rhythm, no murmur, no gallop, no rub, mechanical heart valve sounds, no edema Abdomen: Soft, nontender, normal bowel sounds, no mass, no organomegaly Extremities: No edema, no calf tenderness Musculoskeletal: no joint deformities GU:  Vascular: Carotid pulses 2+, no bruits,  Neurologic: Alert, oriented, PERRLA, cranial nerves grossly normal, motor strength 5 over 5, reflexes 1+ symmetric, upper body coordination normal, gait normal, Skin: Circular rash under right breast which is resolving.  Approximate 1.5 cm circular lesion with irregular border and central clearing left groin above the inguinal ligament which appears to be a local fungal infection.  Ecchymosis skin dorsa of both hands.  Single large 2 x 2 centimeter lesion on the right wrist where she got an insect bite.  Lab Results: CBC W/Diff    Component Value Date/Time   WBC 5.6 11/06/2016 1130   RBC 3.41 (L) 11/06/2016 1130   HGB 10.0 (L) 11/06/2016 1130   HGB 10.7 (L) 08/02/2015 1315   HCT 30.8 (L) 11/06/2016 1130   HCT 28.1 (L) 10/03/2016 1114   HCT 33.2 (L) 08/02/2015 1315   PLT 23 (LL) 11/06/2016 1130   PLT 22 (LL) 10/03/2016 1114   MCV 90.3 11/06/2016 1130   MCV 87 10/03/2016 1114   MCV 93.8 08/02/2015 1315   MCH 29.3 11/06/2016 1130   MCHC 32.5 11/06/2016 1130   RDW 14.2 11/06/2016 1130   RDW 13.8 10/03/2016 1114   RDW 12.7 08/02/2015 1315    LYMPHSABS 0.8 11/06/2016 1130   LYMPHSABS 1.0 10/03/2016 1114   LYMPHSABS 0.9 08/02/2015 1315   MONOABS 0.4 11/06/2016 1130   MONOABS 0.4 08/02/2015 1315   EOSABS 0.4 11/06/2016 1130   EOSABS 0.3 10/03/2016 1114   BASOSABS 0.1 11/06/2016 1130   BASOSABS 0.1 10/03/2016 1114   BASOSABS 0.1 08/02/2015 1315     Chemistry      Component Value Date/Time   NA 138 10/23/2016 1052   NA 140 10/03/2016 1114   NA 142 08/02/2015 1315   K 4.5 10/23/2016 1052   K 4.4 08/02/2015 1315   CL 104 10/23/2016 1052   CL 108 (H) 11/04/2012 1158   CO2 26 10/23/2016 1052   CO2 25 08/02/2015 1315   BUN 55 (H) 10/23/2016 1052   BUN 43 (H) 10/03/2016 1114   BUN 62.9 (H) 08/02/2015 1315   CREATININE 2.32 (H) 10/23/2016 1052   CREATININE  1.9 (H) 08/02/2015 1315      Component Value Date/Time   CALCIUM 8.8 (L) 10/23/2016 1052   CALCIUM 9.1 08/02/2015 1315   ALKPHOS 117 10/03/2016 1114   ALKPHOS 103 08/02/2015 1315   AST 28 10/03/2016 1114   AST 36 (H) 08/02/2015 1315   ALT 19 10/03/2016 1114   ALT 31 08/02/2015 1315   BILITOT 0.6 10/03/2016 1114   BILITOT 0.60 08/02/2015 1315       Radiological Studies: No results found.  Impression:  #1.  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  No new medications.  Stable hemoglobin.  Normal white count and differential.  No suspicion of a new underlying bone marrow process. She is willing to try a second course of high-dose dexamethasone despite the side effects that she experienced. We discussed a strategy if this is not effective in getting her platelet count back into a safe range.  I would like to give her 1 dose of intravenous immunoglobulin to see if this supports an immune etiology of her thrombocytopenia.  If so, then a course of Rituxan anti-CD20 antibody weekly 4.  Approximate 50% response rate.  She asked about splenectomy.  Although this gives the highest and most durable response  of all treatments available for steroid resistant ITP, she is at very high surgical risk with her mechanical heart valve, need for chronic anticoagulation, previous history of heparin-induced thrombocytopenia, chronic renal insufficiency, chronic atrial arrhythmias, and age.  I would reserve splenectomy for emergency situation or if she is refractory to immunotherapy. We discussed the potential use of thrombopoietin stimulating agents.  These are highly effective and can be used for prolonged periods of time, years, but must be used continuously and are very expensive.  Patient has limited financial reserves.  We would have to explore insurance coverage through Medicare.  2.  Antiphospholipid antibody syndrome-long-standing with multiple complications including recurrent TIAs and valvular heart disease requiring mitral valve replacement and refractory atrial arrhythmias.  Current need for chronic anticoagulation.  3.  Chronic nonischemic cardiomyopathy with ejection fraction 35-40% on most recent echocardiogram June 2016.  4.  History of subdural hematoma 2 on warfarin now anticoagulated with chronic Arixtra in view of heparin related thrombocytopenia in the past.  5.  Chronic progressive renal insufficiency stage IV with estimated GFR 25 mL/min  6.  Status post biventricular pacemaker  7.  Refractory atrial arrhythmias status post DC cardioversion and subsequent ablation procedure.  Status post left atrial appendage amputation at time of mitral valve replacement  8.  Hypothyroid on replacement  9.  History of DIC at time of mitral valve failure requiring emergency valve replacement 2007.  10.  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  CC: Patient Care Team: Josetta Huddle, MD as PCP - General (Internal Medicine) Beryle Beams Alyson Locket, MD as Consulting Physician (Oncology) Jamal Maes, MD as Consulting Physician (Nephrology) Stanford Breed Denice Bors, MD as  Consulting Physician (Cardiology) Thompson Grayer, MD as Consulting Physician (Cardiology)   Murriel Hopper, MD, [place]  Hematology-Oncology/Internal Medicine     5/14/20186:22 PM

## 2016-11-06 NOTE — Progress Notes (Signed)
HPI: FU rheumatic heart disease, atrial tachycardia/flutter, and combined systolic/diastolic congestive heart failure. The patient had mitral valvuloplasty at [facility] in the late 90s. In 2007 she had a mechanical mitral valve replacement at [facility]. 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 She's had a prior subdural hematoma that required evacuation. She also has a history of heparin-induced thrombocytopenia. She is followed by Dr Rayann Heman for SVT and is s/p ablation at [facility]; also with h/o torsades with amiodarone. Has had previous biventricular pacemaker. Nuclear study October 2015 showed an ejection fraction of 35%. There was a fixed anterior/apical defect consistent with scar. No ischemia. Echo 6/16 showed EF 35-40, trace AI, mechanical MV, moderate to severe LAE, mildly reduced RV function. Patient is very volume sensitive. Since last seen, she has developed recurrent thrombocytopenia related to ITP. She has received Decadron without improvement. This has caused fatigue. She is now being considered for Rituxan. She denies dyspnea, chest pain, palpitations, syncope or bleeding. Her platelet count is 22,000.  Current Outpatient Prescriptions  Medication Sig Dispense Refill  . acetaminophen (TYLENOL) 650 MG CR tablet Take 1,300 mg by mouth 3 (three) times daily as needed for pain.     Marland Kitchen AMOXICILLIN PO Take 4 capsules by mouth as directed. ONLY WHEN VISITING DENTIST    . Calcium Citrate (CITRACAL PO) Take 1 tablet by mouth daily.    . Cholecalciferol (VITAMIN D) 2000 UNITS tablet Take 2,000 Units by mouth daily.      . Coenzyme Q-10 100 MG capsule Take 100 mg by mouth daily.    Marland Kitchen dexamethasone (DECADRON) 4 MG tablet Take 4 mg by mouth 2 (two) times daily with a meal.    . Estradiol Acetate (FEMRING) 0.05 MG/24HR RING Place 1 each vaginally as directed.     . fluticasone (FLONASE) 50  MCG/ACT nasal spray Place 2 sprays into both nostrils daily.    . fondaparinux (ARIXTRA) 2.5 MG/0.5ML SOLN injection INJECT 0.5ML INTO THE SKIN EVERY OTHER DAY 9.5 mL 11  . furosemide (LASIX) 20 MG tablet Take 1 tablet (20 mg total) by mouth daily. 90 tablet 1  . levothyroxine (SYNTHROID, LEVOTHROID) 88 MCG tablet Take 88 mcg by mouth daily before breakfast.    . metoprolol succinate (TOPROL-XL) 50 MG 24 hr tablet Take 1 tablet (50 mg total) by mouth 2 (two) times daily. Take with or immediately following a meal. 180 tablet 3  . Misc Natural Products (TART CHERRY ADVANCED PO) Take 2 tablets by mouth daily.    . Omega-3 Fatty Acids (OMEGA-3 FISH OIL) 500 MG CAPS Take 1 capsule by mouth daily.    . pantoprazole (PROTONIX) 40 MG tablet Take 1 tablet (40 mg total) by mouth daily. 30 tablet 1  . valACYclovir (VALTREX) 1000 MG tablet Take 1,000 mg by mouth 2 (two) times daily. For a total of 3 doses for labial herpes prn only    . zolpidem (AMBIEN) 5 MG tablet Take 1 tablet (5 mg total) by mouth at bedtime as needed for sleep. 30 tablet 0   No current facility-administered medications for this visit.      Past Medical History:  Diagnosis Date  . Acute ITP ([place]) 10/09/2016  . Anemia   . Antiphospholipid antibody syndrome ([place])   . Arthritis    "may have some" (03/28/2012)  . Atrial flutter ([place])    atypical right atrial  flutter s/p ablation at [facility] by Dr Clyda Hurdle  . Cerebrovascular accident [facility]) ?1990; 1993; 2007   residual "can't use my right hand; left visual field cut" (03/28/2012)  . CHF (congestive heart failure) ([place])   . Chronic kidney disease    "as a result of my heart failing" (03/28/2012)  . Fine motor impairment    "fingers right hand" (03/28/2012)  . Heart murmur   . 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 History of blood transfusion    "lots; most in 2007 w/MVR OR"  . History of Hashimoto thyroiditis   . HLD (hyperlipidemia)   . HTN (hypertension)   . Hypothyroidism   .  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 Pacemaker   . Rheumatic heart disease 1995   post percutaneous valvuloplasty at [facility] with subsequent mechanical mitral valve replacement and tricuspid annuloplasty at [facility]  . Right leg swelling 10/06/2013   Asymmetric; no calf pain, venous doppler negative for DVT 10/06/13  . Subdural hematoma ([place]) 2009   on coumadin    Past Surgical History:  Procedure Laterality Date  . AMPUTATION     left atrial appendage amputated with MVR  . ATRIAL ABLATION SURGERY     CTI and R atriostomy scar flutter ablation at [facility] 8/12,  repeat  ablation at [facility]  9/12  . BI-VENTRICULAR PACEMAKER INSERTION N/A 03/28/2012   Procedure: BI-VENTRICULAR PACEMAKER INSERTION (CRT-P);  Surgeon: Evans Lance, MD;  Location: [facility] CATH LAB;  Service: Cardiovascular;  Laterality: N/A;  . biventricular pacemaker placement  03/28/2012   SJM Anthem implanted by Dr Lovena Le  . CARDIAC ELECTROPHYSIOLOGY [place] AND ABLATION  2012   "2 @ [facility]" (03/28/2012  . CARDIAC VALVE REPLACEMENT    . CARDIOVERSION  2010-2011   "3 at [facility]" (03/28/2012)  . CHOLECYSTECTOMY    . CRANIOTOMY  2008   for SDH  . MITRAL VALVE REPLACEMENT  2007   with tricuspid annuloplasty  . [place] with subsequent mechanical mitral valve replacement and tricuspid annuloplasty at [facility]  . TONSILLECTOMY    . TONSILLECTOMY     "when I was a child"  . TOTAL ABDOMINAL HYSTERECTOMY  ?1995   TAH; BSO    Social History   Social History  . Marital status: Married    Spouse name: Alease Medina   . Number of children: 0  . Years of education: college   Occupational History  . PHYSICIAN Retired    retired   Social History Main Topics  . Smoking status: Former Smoker    Packs/day: 0.10    Years: 6.00    Types: Cigarettes  . Smokeless tobacco: Never Used     Comment: 03/28/2012 "smoked in the 1970's"  . Alcohol use 0.0 oz/week      Comment: wine rarely.  . Drug use: No  . Sexual activity: Not on file   Other Topics Concern  . 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  Quit smoking 25 years ago and quit alcohol 2 years ago.   Denies alcohol, caffeine,  and illicit drug use.      Family History  Problem Relation Age of Onset  . Heart disease Father        late 90's early 80's-CABG  . Dementia Father   . Diabetes Father     ROS:  no fevers or chills, productive cough, hemoptysis, dysphasia, odynophagia, melena, hematochezia, dysuria, hematuria, rash, seizure activity, orthopnea, PND, pedal edema, claudication. Remaining systems are negative.  Physical Exam: Well-developed well-nourished in no acute distress.  Skin is warm and dry.  HEENT is normal.  Neck is supple.  Chest is clear to auscultation with normal expansion.  Cardiovascular exam is regular rate and rhythm.  Abdominal exam nontender or distended. No masses palpated. Extremities show no edema. neuro grossly intact  ECG- ventricular paced rhythm with underlying atrial fibrillation. personally reviewed  A/P  1 chronic combined systolic/diastolic congestive heart failure-a shunt appears to be euvolemic on examination. We will continue with present dose of diuretics. Renal function is monitored by nephrology.  2 status post mitral valve replacement-continue anticoagulation. This is followed by hematology. Continue SBE prophylaxis.  3 hypertension-blood pressure controlled. Continue present medications.  4 cardiomyopathy-we will continue with beta blockade. She is not on an ACE inhibitor or ARB because of baseline renal insufficiency. We have not added hydralazine and nitrates because of hypotension.  5 permanent atrial fibrillation-continue beta blocker and anticoagulation.  6 status post biventricular pacemaker-she is followed by electrophysiology.  7  thrombocytopenia-recent recurrence. Management per hematology. She is concerned about the potential cardiac side effects of Rituxan. We will see her back in 8 weeks to make sure that she is tolerating. Decadron did not improve her platelet count.   Kirk Ruths, MD

## 2016-11-06 NOTE — Addendum Note (Signed)
Addended by: Truddie Crumble on: 11/06/2016 11:30 AM   Modules accepted: Orders

## 2016-11-08 ENCOUNTER — Telehealth: Payer: Self-pay | Admitting: *Deleted

## 2016-11-08 ENCOUNTER — Other Ambulatory Visit: Payer: Self-pay | Admitting: Oncology

## 2016-11-08 DIAGNOSIS — D6861 Antiphospholipid syndrome: Secondary | ICD-10-CM

## 2016-11-08 DIAGNOSIS — Z7901 Long term (current) use of anticoagulants: Secondary | ICD-10-CM

## 2016-11-08 NOTE — Telephone Encounter (Signed)
Call from pt - states when she comes on Monday for labs, it will be a peak day; in case Dr Beryle Beams needs to adjust orders. Told her I will inform Dr Darnell Level.

## 2016-11-13 ENCOUNTER — Ambulatory Visit (INDEPENDENT_AMBULATORY_CARE_PROVIDER_SITE_OTHER): Payer: Medicare Other | Admitting: Cardiology

## 2016-11-13 ENCOUNTER — Other Ambulatory Visit (INDEPENDENT_AMBULATORY_CARE_PROVIDER_SITE_OTHER): Payer: Medicare Other

## 2016-11-13 ENCOUNTER — Encounter: Payer: Self-pay | Admitting: Cardiology

## 2016-11-13 ENCOUNTER — Ambulatory Visit (INDEPENDENT_AMBULATORY_CARE_PROVIDER_SITE_OTHER): Payer: Medicare Other | Admitting: *Deleted

## 2016-11-13 VITALS — BP 132/82 | HR 80 | Ht 63.0 in | Wt 163.2 lb

## 2016-11-13 DIAGNOSIS — I5042 Chronic combined systolic (congestive) and diastolic (congestive) heart failure: Secondary | ICD-10-CM

## 2016-11-13 DIAGNOSIS — D693 Immune thrombocytopenic purpura: Secondary | ICD-10-CM | POA: Diagnosis present

## 2016-11-13 DIAGNOSIS — Z7901 Long term (current) use of anticoagulants: Secondary | ICD-10-CM | POA: Diagnosis not present

## 2016-11-13 DIAGNOSIS — I495 Sick sinus syndrome: Secondary | ICD-10-CM

## 2016-11-13 DIAGNOSIS — D6861 Antiphospholipid syndrome: Secondary | ICD-10-CM

## 2016-11-13 DIAGNOSIS — D696 Thrombocytopenia, unspecified: Secondary | ICD-10-CM

## 2016-11-13 DIAGNOSIS — I10 Essential (primary) hypertension: Secondary | ICD-10-CM | POA: Diagnosis not present

## 2016-11-13 DIAGNOSIS — I428 Other cardiomyopathies: Secondary | ICD-10-CM | POA: Diagnosis not present

## 2016-11-13 LAB — CBC WITH DIFFERENTIAL/PLATELET
BASOS ABS: 0 10*3/uL (ref 0.0–0.1)
Basophils Relative: 0 %
EOS PCT: 3 %
Eosinophils Absolute: 0.3 10*3/uL (ref 0.0–0.7)
HCT: 31.6 % — ABNORMAL LOW (ref 36.0–46.0)
HEMOGLOBIN: 10.1 g/dL — AB (ref 12.0–15.0)
LYMPHS PCT: 24 %
Lymphs Abs: 2.6 10*3/uL (ref 0.7–4.0)
MCH: 28.6 pg (ref 26.0–34.0)
MCHC: 32 g/dL (ref 30.0–36.0)
MCV: 89.5 fL (ref 78.0–100.0)
Monocytes Absolute: 0.4 10*3/uL (ref 0.1–1.0)
Monocytes Relative: 3 %
NEUTROS ABS: 7.5 10*3/uL (ref 1.7–7.7)
NEUTROS PCT: 70 %
PLATELETS: 71 10*3/uL — AB (ref 150–400)
RBC: 3.53 MIL/uL — AB (ref 3.87–5.11)
RDW: 14.2 % (ref 11.5–15.5)
WBC: 10.8 10*3/uL — AB (ref 4.0–10.5)

## 2016-11-13 LAB — HEPARIN ANTI-XA: Heparin LMW: 0.98 IU/mL

## 2016-11-13 NOTE — Patient Instructions (Signed)
Your physician recommends that you schedule a follow-up appointment in: 8 WEEKS WITH DR CRENSHAW   If you need a refill on your cardiac medications before your next appointment, please call your pharmacy.  

## 2016-11-13 NOTE — Progress Notes (Signed)
Remote pacemaker transmission.   

## 2016-11-15 ENCOUNTER — Encounter: Payer: Self-pay | Admitting: Cardiology

## 2016-11-15 LAB — CUP PACEART REMOTE DEVICE CHECK
Battery Remaining Longevity: 63 mo
Battery Voltage: 2.9 V
Date Time Interrogation Session: 20180521061205
Implantable Lead Implant Date: 20131003
Implantable Lead Implant Date: 20131003
Implantable Lead Location: 753860
Lead Channel Impedance Value: 360 Ohm
Lead Channel Pacing Threshold Amplitude: 0.625 V
Lead Channel Pacing Threshold Amplitude: 0.875 V
Lead Channel Pacing Threshold Pulse Width: 0.5 ms
Lead Channel Setting Pacing Amplitude: 2 V
Lead Channel Setting Pacing Pulse Width: 0.8 ms
Lead Channel Setting Sensing Sensitivity: 2 mV
MDC IDC LEAD IMPLANT DT: 20131003
MDC IDC LEAD LOCATION: 753858
MDC IDC LEAD LOCATION: 753859
MDC IDC MSMT BATTERY REMAINING PERCENTAGE: 65 %
MDC IDC MSMT LEADCHNL LV IMPEDANCE VALUE: 950 Ohm
MDC IDC MSMT LEADCHNL LV PACING THRESHOLD PULSEWIDTH: 0.8 ms
MDC IDC MSMT LEADCHNL RV SENSING INTR AMPL: 8.7 mV
MDC IDC PG IMPLANT DT: 20131003
MDC IDC SET LEADCHNL RV PACING AMPLITUDE: 2 V
MDC IDC SET LEADCHNL RV PACING PULSEWIDTH: 0.5 ms
Pulse Gen Model: 3210
Pulse Gen Serial Number: 2786353

## 2016-11-16 ENCOUNTER — Telehealth: Payer: Self-pay | Admitting: *Deleted

## 2016-11-16 NOTE — Telephone Encounter (Signed)
Pt called to change lab next week appt from Tuesday to Wednesday  -told her this should be ok.

## 2016-11-21 ENCOUNTER — Other Ambulatory Visit: Payer: Medicare Other

## 2016-11-22 ENCOUNTER — Other Ambulatory Visit: Payer: Self-pay | Admitting: Oncology

## 2016-11-22 ENCOUNTER — Other Ambulatory Visit (INDEPENDENT_AMBULATORY_CARE_PROVIDER_SITE_OTHER): Payer: Medicare Other

## 2016-11-22 DIAGNOSIS — D6861 Antiphospholipid syndrome: Secondary | ICD-10-CM

## 2016-11-22 DIAGNOSIS — Z7901 Long term (current) use of anticoagulants: Secondary | ICD-10-CM

## 2016-11-22 DIAGNOSIS — Z9289 Personal history of other medical treatment: Secondary | ICD-10-CM

## 2016-11-22 DIAGNOSIS — D693 Immune thrombocytopenic purpura: Secondary | ICD-10-CM

## 2016-11-22 DIAGNOSIS — D696 Thrombocytopenia, unspecified: Secondary | ICD-10-CM

## 2016-11-22 LAB — CBC WITH DIFFERENTIAL/PLATELET
BASOS PCT: 1 %
Basophils Absolute: 0.1 10*3/uL (ref 0.0–0.1)
EOS ABS: 0.4 10*3/uL (ref 0.0–0.7)
Eosinophils Relative: 5 %
HCT: 30.6 % — ABNORMAL LOW (ref 36.0–46.0)
HEMOGLOBIN: 9.5 g/dL — AB (ref 12.0–15.0)
LYMPHS ABS: 1 10*3/uL (ref 0.7–4.0)
Lymphocytes Relative: 12 %
MCH: 28.1 pg (ref 26.0–34.0)
MCHC: 31 g/dL (ref 30.0–36.0)
MCV: 90.5 fL (ref 78.0–100.0)
MONO ABS: 0.4 10*3/uL (ref 0.1–1.0)
MONOS PCT: 5 %
NEUTROS PCT: 78 %
Neutro Abs: 6.9 10*3/uL (ref 1.7–7.7)
Platelets: 45 10*3/uL — ABNORMAL LOW (ref 150–400)
RBC: 3.38 MIL/uL — ABNORMAL LOW (ref 3.87–5.11)
RDW: 14.6 % (ref 11.5–15.5)
WBC: 8.9 10*3/uL (ref 4.0–10.5)

## 2016-11-28 ENCOUNTER — Other Ambulatory Visit: Payer: Medicare Other

## 2016-11-29 ENCOUNTER — Other Ambulatory Visit (INDEPENDENT_AMBULATORY_CARE_PROVIDER_SITE_OTHER): Payer: Medicare Other

## 2016-11-29 DIAGNOSIS — Z950 Presence of cardiac pacemaker: Secondary | ICD-10-CM | POA: Diagnosis not present

## 2016-11-29 DIAGNOSIS — Z9289 Personal history of other medical treatment: Secondary | ICD-10-CM

## 2016-11-29 DIAGNOSIS — Z862 Personal history of diseases of the blood and blood-forming organs and certain disorders involving the immune mechanism: Secondary | ICD-10-CM | POA: Diagnosis not present

## 2016-11-29 DIAGNOSIS — Z9889 Other specified postprocedural states: Secondary | ICD-10-CM | POA: Diagnosis not present

## 2016-11-29 DIAGNOSIS — Z1159 Encounter for screening for other viral diseases: Secondary | ICD-10-CM | POA: Diagnosis not present

## 2016-11-29 DIAGNOSIS — D696 Thrombocytopenia, unspecified: Secondary | ICD-10-CM

## 2016-11-29 DIAGNOSIS — Z79899 Other long term (current) drug therapy: Secondary | ICD-10-CM | POA: Diagnosis not present

## 2016-11-29 DIAGNOSIS — D693 Immune thrombocytopenic purpura: Secondary | ICD-10-CM

## 2016-11-29 DIAGNOSIS — D6861 Antiphospholipid syndrome: Secondary | ICD-10-CM | POA: Diagnosis not present

## 2016-11-29 DIAGNOSIS — Z7901 Long term (current) use of anticoagulants: Secondary | ICD-10-CM

## 2016-11-29 LAB — CBC WITH DIFFERENTIAL/PLATELET
BASOS ABS: 0.1 10*3/uL (ref 0.0–0.1)
BASOS PCT: 1 %
Eosinophils Absolute: 0.4 10*3/uL (ref 0.0–0.7)
Eosinophils Relative: 6 %
HEMATOCRIT: 29.7 % — AB (ref 36.0–46.0)
Hemoglobin: 9.5 g/dL — ABNORMAL LOW (ref 12.0–15.0)
LYMPHS PCT: 11 %
Lymphs Abs: 0.7 10*3/uL (ref 0.7–4.0)
MCH: 29.1 pg (ref 26.0–34.0)
MCHC: 32 g/dL (ref 30.0–36.0)
MCV: 91.1 fL (ref 78.0–100.0)
Monocytes Absolute: 0.2 10*3/uL (ref 0.1–1.0)
Monocytes Relative: 4 %
NEUTROS ABS: 5 10*3/uL (ref 1.7–7.7)
Neutrophils Relative %: 78 %
PLATELETS: 35 10*3/uL — AB (ref 150–400)
RBC: 3.26 MIL/uL — AB (ref 3.87–5.11)
RDW: 14.8 % (ref 11.5–15.5)
WBC: 6.4 10*3/uL (ref 4.0–10.5)

## 2016-11-29 LAB — HEPARIN ANTI-XA: Heparin LMW: 0.88 IU/mL

## 2016-11-30 ENCOUNTER — Other Ambulatory Visit: Payer: Self-pay | Admitting: Oncology

## 2016-11-30 ENCOUNTER — Other Ambulatory Visit: Payer: Self-pay | Admitting: Cardiology

## 2016-11-30 DIAGNOSIS — I5042 Chronic combined systolic (congestive) and diastolic (congestive) heart failure: Secondary | ICD-10-CM

## 2016-11-30 LAB — HEPATITIS B SURFACE ANTIBODY,QUALITATIVE: HEP B SURFACE AB, QUAL: NONREACTIVE

## 2016-11-30 LAB — COMPREHENSIVE METABOLIC PANEL
ALK PHOS: 119 IU/L — AB (ref 39–117)
ALT: 26 IU/L (ref 0–32)
AST: 38 IU/L (ref 0–40)
Albumin/Globulin Ratio: 1.9 (ref 1.2–2.2)
Albumin: 3.7 g/dL (ref 3.5–4.8)
BILIRUBIN TOTAL: 0.4 mg/dL (ref 0.0–1.2)
BUN/Creatinine Ratio: 24 (ref 12–28)
BUN: 42 mg/dL — ABNORMAL HIGH (ref 8–27)
CHLORIDE: 103 mmol/L (ref 96–106)
CO2: 24 mmol/L (ref 18–29)
Calcium: 8.4 mg/dL — ABNORMAL LOW (ref 8.7–10.3)
Creatinine, Ser: 1.73 mg/dL — ABNORMAL HIGH (ref 0.57–1.00)
GFR calc Af Amer: 34 mL/min/{1.73_m2} — ABNORMAL LOW (ref >59)
GFR calc non Af Amer: 29 mL/min/{1.73_m2} — ABNORMAL LOW (ref >59)
Globulin, Total: 1.9 g/dL (ref 1.5–4.5)
Glucose: 92 mg/dL (ref 65–99)
POTASSIUM: 4.4 mmol/L (ref 3.5–5.2)
Sodium: 141 mmol/L (ref 134–144)
Total Protein: 5.6 g/dL — ABNORMAL LOW (ref 6.0–8.5)

## 2016-11-30 LAB — HEPATITIS PANEL, ACUTE
HEP A IGM: NEGATIVE
HEP B C IGM: NEGATIVE
HEP B S AG: NEGATIVE

## 2016-12-04 ENCOUNTER — Encounter: Payer: Self-pay | Admitting: Oncology

## 2016-12-04 ENCOUNTER — Ambulatory Visit: Payer: Medicare Other | Admitting: Oncology

## 2016-12-04 ENCOUNTER — Other Ambulatory Visit: Payer: Self-pay | Admitting: Oncology

## 2016-12-04 DIAGNOSIS — D693 Immune thrombocytopenic purpura: Secondary | ICD-10-CM

## 2016-12-04 NOTE — Progress Notes (Signed)
Family conference: Dr. Kathyrn Drown in today with her husband to discuss further management of progressive ITP.  The patient has had chronic mild thrombocytopenia related to antiphospholipid antibody syndrome.  She has had a number of complications detailed in my previous notes including acquired mitral valve disease requiring mitral valve replacement, refractory atrial arrhythmias requiring cardioversion and radiofrequency ablation, warfarin related subdural hematomas, heparin induced thrombocytopenia without thrombosis, perioperative DIC prior to mitral valve replacement, postoperative right-sided stroke with left hemiparesis, and stage IV renal insufficiency.  Platelet count previously in the 90-100000 range fell to the 70-90000 range in 2017 and since January of this year has steadily declined down to 22,000 as of October 03, 2016. She has a normal white count with differential.  She is chronically anemic due to her renal insufficiency.  Review of the peripheral blood film does not reveal any early myeloid cells or other changes to suggest a developing myelodysplastic syndrome.  She has not had any recent infections.  She has not started any new medications.  Screen for hepatitis A, B, C negative on November 29, 2016.  I gave her a trial of pulse high-dose dexamethasone 40 mg daily 4 days with minimal increase in platelets to 44,000 with fall back to 23,000 and just 2 weeks.  Following a second course, platelet count rose to 71,000 but has again fallen down to 35,000 as of June 6.  We discussed further treatment options at this time.  She is at high risk for bleeding.  She is maintained on therapeutic fondaparinux in view of mechanical mitral valve replacement, recurrent subdural hematoma on warfarin, and history of heparin related thrombocytopenia. Although splenectomy gives the highest and most durable response in steroid refractory patients, I feel her risk for surgery is unacceptable and it would be very difficult  to manage anticoagulants perioperatively in view of the heparin related thrombocytopenia in the past.  One would have to use a parenteral direct thrombin inhibitor. Rituxan has been used for many years as a treatment for relapsed ITP but has limited efficacy with an approximate 50% overall response rate and responses are durable only about 20% of the time.  Rituxan is expensive.  It  can be associated with significant infusion reactions including anaphylaxis. I am advising a trial of a thrombopoietin stimulating agent either Promacta or N-Plate.  These drugs have shown excellent results and no long-term safety issues have arisen.  There are new data recently that suggest that some people who have taken these drugs for more than a year may be able to get off the drugs and still remain in remission. Since she is not actively bleeding at this time, I would like to start with the Promacta if we can get insurance approval.

## 2016-12-06 ENCOUNTER — Other Ambulatory Visit (INDEPENDENT_AMBULATORY_CARE_PROVIDER_SITE_OTHER): Payer: Medicare Other

## 2016-12-06 ENCOUNTER — Encounter: Payer: Self-pay | Admitting: Oncology

## 2016-12-06 ENCOUNTER — Telehealth: Payer: Self-pay | Admitting: *Deleted

## 2016-12-06 DIAGNOSIS — D693 Immune thrombocytopenic purpura: Secondary | ICD-10-CM | POA: Diagnosis present

## 2016-12-06 HISTORY — DX: Immune thrombocytopenic purpura: D69.3

## 2016-12-06 LAB — CBC WITH DIFFERENTIAL/PLATELET
Basophils Absolute: 0.1 10*3/uL (ref 0.0–0.1)
Basophils Relative: 2 %
EOS ABS: 0.6 10*3/uL (ref 0.0–0.7)
Eosinophils Relative: 9 %
HCT: 32 % — ABNORMAL LOW (ref 36.0–46.0)
HEMOGLOBIN: 10 g/dL — AB (ref 12.0–15.0)
LYMPHS ABS: 1.2 10*3/uL (ref 0.7–4.0)
LYMPHS PCT: 19 %
MCH: 28.9 pg (ref 26.0–34.0)
MCHC: 31.3 g/dL (ref 30.0–36.0)
MCV: 92.5 fL (ref 78.0–100.0)
MONOS PCT: 5 %
Monocytes Absolute: 0.3 10*3/uL (ref 0.1–1.0)
NEUTROS PCT: 67 %
Neutro Abs: 4.2 10*3/uL (ref 1.7–7.7)
Platelets: 34 10*3/uL — ABNORMAL LOW (ref 150–400)
RBC: 3.46 MIL/uL — AB (ref 3.87–5.11)
RDW: 15 % (ref 11.5–15.5)
WBC: 6.3 10*3/uL (ref 4.0–10.5)

## 2016-12-06 NOTE — Telephone Encounter (Signed)
Call made to [facility] to initiate PA on Promacta and had PA form faxed to [facility] for completion by DrGranfortuna. Form faxed to [facility]. May be 48-72 hours for a response.Despina Hidden Cassady6/13/20184:56 PM

## 2016-12-14 NOTE — Telephone Encounter (Signed)
Rx phoned into [employer] (mail-order) on 06/201/2018.  Medication is not available at [employer]  Pt was contacted today to inform her that once [facility] confirm her coverage, they will call her with co-pay cost.  Pt was also instructed to go online and sign up through the manufacturer for copay assistance.  Pt agreed.  Pt will contact [facility] if she is unable to afford the medication.Despina Hidden Cassady6/21/20183:33 PM

## 2016-12-19 ENCOUNTER — Telehealth: Payer: Self-pay | Admitting: *Deleted

## 2016-12-19 ENCOUNTER — Other Ambulatory Visit: Payer: Self-pay | Admitting: Oncology

## 2016-12-19 DIAGNOSIS — D693 Immune thrombocytopenic purpura: Secondary | ICD-10-CM

## 2016-12-19 DIAGNOSIS — D6861 Antiphospholipid syndrome: Secondary | ICD-10-CM

## 2016-12-19 NOTE — Telephone Encounter (Signed)
yes

## 2016-12-19 NOTE — Telephone Encounter (Signed)
Pt aware and will have labs drawn tomorrow.Cassandra Hidden Cassady6/26/20182:07 PM

## 2016-12-19 NOTE — Telephone Encounter (Signed)
Call from pt - states last labs were 2 weeks ago to check platelets. She will be in town tomorrow and wants to know if she needs to come in for labs? Thanks

## 2016-12-20 ENCOUNTER — Other Ambulatory Visit (INDEPENDENT_AMBULATORY_CARE_PROVIDER_SITE_OTHER): Payer: Medicare Other

## 2016-12-20 DIAGNOSIS — D6861 Antiphospholipid syndrome: Secondary | ICD-10-CM | POA: Diagnosis present

## 2016-12-20 DIAGNOSIS — D693 Immune thrombocytopenic purpura: Secondary | ICD-10-CM

## 2016-12-20 LAB — CBC WITH DIFFERENTIAL/PLATELET
BASOS ABS: 0.1 10*3/uL (ref 0.0–0.1)
Basophils Relative: 1 %
EOS PCT: 7 %
Eosinophils Absolute: 0.5 10*3/uL (ref 0.0–0.7)
HCT: 31.4 % — ABNORMAL LOW (ref 36.0–46.0)
HEMOGLOBIN: 9.9 g/dL — AB (ref 12.0–15.0)
LYMPHS PCT: 15 %
Lymphs Abs: 1.1 10*3/uL (ref 0.7–4.0)
MCH: 28.9 pg (ref 26.0–34.0)
MCHC: 31.5 g/dL (ref 30.0–36.0)
MCV: 91.8 fL (ref 78.0–100.0)
Monocytes Absolute: 0.2 10*3/uL (ref 0.1–1.0)
Monocytes Relative: 3 %
NEUTROS PCT: 73 %
Neutro Abs: 5.3 10*3/uL (ref 1.7–7.7)
PLATELETS: 32 10*3/uL — AB (ref 150–400)
RBC: 3.42 MIL/uL — AB (ref 3.87–5.11)
RDW: 14.9 % (ref 11.5–15.5)
WBC: 7.2 10*3/uL (ref 4.0–10.5)

## 2016-12-25 NOTE — Progress Notes (Deleted)
HPI: FU rheumatic heart disease, atrial tachycardia/flutter, and combined systolic/diastolic congestive heart failure. The patient had mitral valvuloplasty at [facility] in the late 90s. In 2007 she had a mechanical mitral valve replacement at [facility]. 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 She's had a prior subdural hematoma that required evacuation. She also has a history of heparin-induced thrombocytopenia. She is followed by Dr Rayann Heman for SVT and is s/p ablation at [facility]; also with h/o torsades with amiodarone. Has had previous biventricular pacemaker. Nuclear study October 2015 showed an ejection fraction of 35%. There was a fixed anterior/apical defect consistent with scar. No ischemia. Echo 6/16 showed EF 35-40, trace AI, mechanical MV, moderate to severe LAE, mildly reduced RV function. Patient is very volume sensitive. Being seen recently by hematology for thrombocytopenia. Since last seen,   Current Outpatient Prescriptions  Medication Sig Dispense Refill  . acetaminophen (TYLENOL) 650 MG CR tablet Take 1,300 mg by mouth 3 (three) times daily as needed for pain.     Marland Kitchen AMOXICILLIN PO Take 4 capsules by mouth as directed. ONLY WHEN VISITING DENTIST    . Calcium Citrate (CITRACAL PO) Take 1 tablet by mouth daily.    . Cholecalciferol (VITAMIN D) 2000 UNITS tablet Take 2,000 Units by mouth daily.      . Coenzyme Q-10 100 MG capsule Take 100 mg by mouth daily.    Marland Kitchen dexamethasone (DECADRON) 4 MG tablet Take 4 mg by mouth 2 (two) times daily with a meal.    . Estradiol Acetate (FEMRING) 0.05 MG/24HR RING Place 1 each vaginally as directed.     . fluticasone (FLONASE) 50 MCG/ACT nasal spray Place 2 sprays into both nostrils daily.    . fondaparinux (ARIXTRA) 2.5 MG/0.5ML SOLN injection INJECT 0.5ML INTO THE SKIN EVERY OTHER DAY 9.5 mL 11  . furosemide (LASIX) 20 MG tablet Take 1  tablet (20 mg total) by mouth daily. 90 tablet 1  . levothyroxine (SYNTHROID, LEVOTHROID) 88 MCG tablet Take 88 mcg by mouth daily before breakfast.    . metoprolol succinate (TOPROL-XL) 50 MG 24 hr tablet Take 1 tablet (50 mg total) by mouth 2 (two) times daily. Take with or immediately following a meal. 180 tablet 3  . Misc Natural Products (TART CHERRY ADVANCED PO) Take 2 tablets by mouth daily.    . Omega-3 Fatty Acids (OMEGA-3 FISH OIL) 500 MG CAPS Take 1 capsule by mouth daily.    . pantoprazole (PROTONIX) 40 MG tablet Take 1 tablet (40 mg total) by mouth daily. 30 tablet 1  . valACYclovir (VALTREX) 1000 MG tablet Take 1,000 mg by mouth 2 (two) times daily. For a total of 3 doses for labial herpes prn only    . zolpidem (AMBIEN) 5 MG tablet Take 1 tablet (5 mg total) by mouth at bedtime as needed for sleep. 30 tablet 0   No current facility-administered medications for this visit.      Past Medical History:  Diagnosis Date  . Acute ITP ([place]) 10/09/2016  . Anemia   . Antiphospholipid antibody syndrome ([place])   . Arthritis    "may have some" (03/28/2012)  . Atrial flutter ([place])    atypical right atrial flutter s/p ablation at [facility] by Dr Clyda Hurdle  . Cerebrovascular accident [facility]) ?1990; 1993; 2007   residual "can't use my right hand; left visual field cut" (03/28/2012)  .  CHF (congestive heart failure) ([place])   . 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 Chronic kidney disease    "as a result of my heart failing" (03/28/2012)  . Fine motor impairment    "fingers right hand" (03/28/2012)  . Heart murmur   . Heparin-induced thrombocytopenia ([place])   . History of blood transfusion    "lots; most in 2007 w/MVR OR"  . History of Hashimoto thyroiditis   . HLD (hyperlipidemia)   . HTN (hypertension)   . Hypothyroidism   . 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 Pacemaker   . Rheumatic heart disease 1995   post percutaneous valvuloplasty at [facility] with  subsequent mechanical mitral valve replacement and tricuspid annuloplasty at [facility]  . Right leg swelling 10/06/2013   Asymmetric; no calf pain, venous doppler negative for DVT 10/06/13  . Subdural hematoma ([place]) 2009   on coumadin    Past Surgical History:  Procedure Laterality Date  . AMPUTATION     left atrial appendage amputated with MVR  . ATRIAL ABLATION SURGERY     CTI and R atriostomy scar flutter ablation at [facility] 8/12,  repeat  ablation at [facility]  9/12  . BI-VENTRICULAR PACEMAKER INSERTION N/A 03/28/2012   Procedure: BI-VENTRICULAR PACEMAKER INSERTION (CRT-P);  Surgeon: Evans Lance, MD;  Location: [facility] CATH LAB;  Service: Cardiovascular;  Laterality: N/A;  . biventricular pacemaker placement  03/28/2012   SJM Anthem implanted by Dr Lovena Le  . CARDIAC ELECTROPHYSIOLOGY [place] AND ABLATION  2012   "2 @ [facility]" (03/28/2012  . CARDIAC VALVE REPLACEMENT    . CARDIOVERSION  2010-2011   "3 at [facility]" (03/28/2012)  . CHOLECYSTECTOMY    . CRANIOTOMY  2008   for SDH  . MITRAL VALVE REPLACEMENT  2007   with tricuspid annuloplasty  . [place] with subsequent mechanical mitral valve replacement and tricuspid annuloplasty at [facility]  . TONSILLECTOMY    . TONSILLECTOMY     "when I was a child"  . TOTAL ABDOMINAL HYSTERECTOMY  ?1995   TAH; BSO    Social History   Social History  . Marital status: Married    Spouse name: Alease Medina   . Number of children: 0  . Years of education: college   Occupational History  . PHYSICIAN Retired    retired   Social History Main Topics  . Smoking status: Former Smoker    Packs/day: 0.10    Years: 6.00    Types: Cigarettes  . Smokeless tobacco: Never Used     Comment: 03/28/2012 "smoked in the 1970's"  . Alcohol use 0.0 oz/week     Comment: wine rarely.  . Drug use: No  . Sexual activity: Not on file   Other Topics Concern  . 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 Lives at home with her husband.  Quit smoking 25 years ago and quit alcohol 2 years ago.   Denies alcohol, caffeine,  and illicit drug use.      Family History  Problem Relation Age of Onset  . Heart disease Father        late 30's early 80's-CABG  . Dementia Father   . Diabetes Father     ROS: no fevers or chills, productive cough, hemoptysis, dysphasia, odynophagia, melena, hematochezia, dysuria, hematuria, rash, seizure activity, orthopnea, PND, pedal edema, claudication. Remaining systems  are negative.  Physical Exam: Well-developed well-nourished in no acute distress.  Skin is warm and dry.  HEENT is normal.  Neck is supple.  Chest is clear to auscultation with normal expansion.  Cardiovascular exam is regular rate and rhythm.  Abdominal exam nontender or distended. No masses palpated. Extremities show no edema. neuro grossly intact  ECG- personally reviewed  A/P  1  Kirk Ruths, MD

## 2016-12-28 NOTE — Telephone Encounter (Signed)
Pt's copay will be around $1800/mth.  Pt was given [employer] enrollment application to complete.  Pt completed paperwork and CMA faxed it to [employer].  CMA is currently waiting on the status of application.Cassandra Hidden Cassady7/5/20184:59 PM

## 2017-01-01 ENCOUNTER — Encounter: Payer: Self-pay | Admitting: Cardiology

## 2017-01-01 NOTE — Telephone Encounter (Signed)
This encounter was created in error - please disregard.

## 2017-01-01 NOTE — Telephone Encounter (Signed)
New message     Patient calling stating message is way to long- did not want to disclose any information. Wants the nurse to call her back.

## 2017-01-03 ENCOUNTER — Other Ambulatory Visit (INDEPENDENT_AMBULATORY_CARE_PROVIDER_SITE_OTHER): Payer: Medicare Other

## 2017-01-03 ENCOUNTER — Other Ambulatory Visit: Payer: Self-pay | Admitting: Oncology

## 2017-01-03 ENCOUNTER — Telehealth: Payer: Self-pay | Admitting: *Deleted

## 2017-01-03 ENCOUNTER — Encounter: Payer: Self-pay | Admitting: *Deleted

## 2017-01-03 ENCOUNTER — Telehealth: Payer: Self-pay

## 2017-01-03 DIAGNOSIS — D6861 Antiphospholipid syndrome: Secondary | ICD-10-CM

## 2017-01-03 DIAGNOSIS — D693 Immune thrombocytopenic purpura: Secondary | ICD-10-CM | POA: Diagnosis present

## 2017-01-03 LAB — CBC WITH DIFFERENTIAL/PLATELET
Basophils Absolute: 0.1 10*3/uL (ref 0.0–0.1)
Basophils Relative: 1 %
Eosinophils Absolute: 0.5 10*3/uL (ref 0.0–0.7)
Eosinophils Relative: 6 %
HEMATOCRIT: 30.6 % — AB (ref 36.0–46.0)
Hemoglobin: 9.6 g/dL — ABNORMAL LOW (ref 12.0–15.0)
LYMPHS ABS: 1.1 10*3/uL (ref 0.7–4.0)
Lymphocytes Relative: 14 %
MCH: 29.2 pg (ref 26.0–34.0)
MCHC: 31.4 g/dL (ref 30.0–36.0)
MCV: 93 fL (ref 78.0–100.0)
MONOS PCT: 4 %
Monocytes Absolute: 0.3 10*3/uL (ref 0.1–1.0)
Neutro Abs: 5.6 10*3/uL (ref 1.7–7.7)
Neutrophils Relative %: 75 %
Platelets: 25 10*3/uL — CL (ref 150–400)
RBC: 3.29 MIL/uL — AB (ref 3.87–5.11)
RDW: 15 % (ref 11.5–15.5)
WBC: 7.4 10*3/uL (ref 4.0–10.5)

## 2017-01-03 NOTE — Telephone Encounter (Signed)
Informed pt of appt tomorrow @ [facility] @ 1045 AM for N-plate injection; arrive early to register at Admissions. Pt voiced understanding.

## 2017-01-03 NOTE — Telephone Encounter (Signed)
Lab called critical plates value 25 I  informed Dr. Darnell Level of results

## 2017-01-04 ENCOUNTER — Ambulatory Visit (HOSPITAL_COMMUNITY)
Admission: RE | Admit: 2017-01-04 | Discharge: 2017-01-04 | Disposition: A | Discharge status: Home / Self Care | Payer: Medicare Other | Source: Ambulatory Visit | Attending: Oncology | Admitting: Oncology

## 2017-01-04 DIAGNOSIS — D693 Immune thrombocytopenic purpura: Secondary | ICD-10-CM

## 2017-01-04 MED ORDER — ROMIPLOSTIM 250 MCG ~~LOC~~ SOLR
2.0000 ug/kg | SUBCUTANEOUS | Status: DC
  Administered 2017-01-04: 150 ug via SUBCUTANEOUS
  Filled 2017-01-04: qty 0.3

## 2017-01-04 NOTE — Discharge Instructions (Signed)
Romiplostim injection What is this medicine? ROMIPLOSTIM (roe mi PLOE stim) helps your body make more platelets. This medicine is used to treat low platelets caused by chronic idiopathic thrombocytopenic purpura (ITP). This medicine may be used for other purposes; ask your health care provider or pharmacist if you have questions. COMMON BRAND NAME(S): Nplate What should I tell my health care provider before I take this medicine? They need to know if you have any of these conditions: -cancer or myelodysplastic syndrome -low blood counts, like low white cell, platelet, or red cell counts -take medicines that treat or prevent blood clots -an unusual or allergic reaction to romiplostim, mannitol, other medicines, foods, dyes, or preservatives -pregnant or trying to get pregnant -breast-feeding How should I use this medicine? This medicine is for injection under the skin. It is given by a health care professional in a hospital or clinic setting. A special MedGuide will be given to you before your injection. Read this information carefully each time. Talk to your pediatrician regarding the use of this medicine in children. Special care may be needed. Overdosage: If you think you have taken too much of this medicine contact a poison control center or emergency room at once. NOTE: This medicine is only for you. Do not share this medicine with others. What if I miss a dose? It is important not to miss your dose. Call your doctor or health care professional if you are unable to keep an appointment. What may interact with this medicine? Interactions are not expected. This list may not describe all possible interactions. Give your health care provider a list of all the medicines, herbs, non-prescription drugs, or dietary supplements you use. Also tell them if you smoke, drink alcohol, or use illegal drugs. Some items may interact with your medicine. What should I watch for while using this  medicine? Your condition will be monitored carefully while you are receiving this medicine. Visit your prescriber or health care professional for regular checks on your progress and for the needed blood tests. It is important to keep all appointments. What side effects may I notice from receiving this medicine? Side effects that you should report to your doctor or health care professional as soon as possible: -allergic reactions like skin rash, itching or hives, swelling of the face, lips, or tongue -shortness of breath, chest pain, swelling in a leg -unusual bleeding or bruising Side effects that usually do not require medical attention (report to your doctor or health care professional if they continue or are bothersome): -dizziness -headache -muscle aches -pain in arms and legs -stomach pain -trouble sleeping This list may not describe all possible side effects. Call your doctor for medical advice about side effects. You may report side effects to FDA at 1-800-FDA-1088. Where should I keep my medicine? This drug is given in a hospital or clinic and will not be stored at home. NOTE: This sheet is a summary. It may not cover all possible information. If you have questions about this medicine, talk to your doctor, pharmacist, or health care provider.  2018 Elsevier/Gold Standard (2008-02-10 15:13:04)  

## 2017-01-08 ENCOUNTER — Ambulatory Visit: Payer: Medicare Other | Admitting: Cardiology

## 2017-01-11 ENCOUNTER — Encounter (HOSPITAL_COMMUNITY)
Admission: RE | Admit: 2017-01-11 | Discharge: 2017-01-11 | Disposition: A | Discharge status: Home / Self Care | Payer: Medicare Other | Source: Ambulatory Visit | Attending: Oncology | Admitting: Oncology

## 2017-01-11 ENCOUNTER — Other Ambulatory Visit: Payer: Self-pay | Admitting: Oncology

## 2017-01-11 ENCOUNTER — Other Ambulatory Visit (INDEPENDENT_AMBULATORY_CARE_PROVIDER_SITE_OTHER): Payer: Medicare Other

## 2017-01-11 DIAGNOSIS — D693 Immune thrombocytopenic purpura: Secondary | ICD-10-CM | POA: Insufficient documentation

## 2017-01-11 DIAGNOSIS — D6861 Antiphospholipid syndrome: Secondary | ICD-10-CM

## 2017-01-11 DIAGNOSIS — I10 Essential (primary) hypertension: Secondary | ICD-10-CM | POA: Diagnosis not present

## 2017-01-11 LAB — CBC WITH DIFFERENTIAL/PLATELET
BASOS ABS: 0.1 10*3/uL (ref 0.0–0.1)
BASOS PCT: 1 %
EOS ABS: 0.3 10*3/uL (ref 0.0–0.7)
Eosinophils Relative: 4 %
HEMATOCRIT: 30.3 % — AB (ref 36.0–46.0)
Hemoglobin: 9.7 g/dL — ABNORMAL LOW (ref 12.0–15.0)
Lymphocytes Relative: 13 %
Lymphs Abs: 1 10*3/uL (ref 0.7–4.0)
MCH: 29.2 pg (ref 26.0–34.0)
MCHC: 32 g/dL (ref 30.0–36.0)
MCV: 91.3 fL (ref 78.0–100.0)
MONO ABS: 0.5 10*3/uL (ref 0.1–1.0)
MONOS PCT: 6 %
NEUTROS ABS: 6.1 10*3/uL (ref 1.7–7.7)
Neutrophils Relative %: 77 %
Platelets: 25 10*3/uL — CL (ref 150–400)
RBC: 3.32 MIL/uL — ABNORMAL LOW (ref 3.87–5.11)
RDW: 14.9 % (ref 11.5–15.5)
WBC: 7.9 10*3/uL (ref 4.0–10.5)

## 2017-01-11 LAB — BASIC METABOLIC PANEL
ANION GAP: 7 (ref 5–15)
BUN: 52 mg/dL — ABNORMAL HIGH (ref 6–20)
CALCIUM: 8.7 mg/dL — AB (ref 8.9–10.3)
CO2: 28 mmol/L (ref 22–32)
Chloride: 104 mmol/L (ref 101–111)
Creatinine, Ser: 2.16 mg/dL — ABNORMAL HIGH (ref 0.44–1.00)
GFR, EST AFRICAN AMERICAN: 25 mL/min — AB (ref >60)
GFR, EST NON AFRICAN AMERICAN: 22 mL/min — AB (ref >60)
GLUCOSE: 92 mg/dL (ref 65–99)
POTASSIUM: 4.6 mmol/L (ref 3.5–5.1)
SODIUM: 139 mmol/L (ref 135–145)

## 2017-01-11 MED ORDER — ROMIPLOSTIM 250 MCG ~~LOC~~ SOLR
3.0000 ug/kg | SUBCUTANEOUS | Status: DC
Start: 2017-01-11 — End: 2017-01-12
  Administered 2017-01-11: 220 ug via SUBCUTANEOUS
  Filled 2017-01-11: qty 0.44

## 2017-01-11 NOTE — Addendum Note (Signed)
Addended by: Truddie Crumble on: 01/11/2017 10:11 AM   Modules accepted: Orders

## 2017-01-17 ENCOUNTER — Other Ambulatory Visit (HOSPITAL_COMMUNITY): Payer: Self-pay | Admitting: *Deleted

## 2017-01-18 ENCOUNTER — Encounter: Payer: Self-pay | Admitting: Oncology

## 2017-01-18 ENCOUNTER — Encounter (HOSPITAL_COMMUNITY)
Admission: RE | Admit: 2017-01-18 | Discharge: 2017-01-18 | Disposition: A | Discharge status: Home / Self Care | Payer: Medicare Other | Source: Ambulatory Visit | Attending: Oncology | Admitting: Oncology

## 2017-01-18 ENCOUNTER — Other Ambulatory Visit (INDEPENDENT_AMBULATORY_CARE_PROVIDER_SITE_OTHER): Payer: Medicare Other

## 2017-01-18 ENCOUNTER — Other Ambulatory Visit: Payer: Self-pay | Admitting: Oncology

## 2017-01-18 DIAGNOSIS — D6861 Antiphospholipid syndrome: Secondary | ICD-10-CM | POA: Diagnosis not present

## 2017-01-18 DIAGNOSIS — D693 Immune thrombocytopenic purpura: Secondary | ICD-10-CM

## 2017-01-18 DIAGNOSIS — D649 Anemia, unspecified: Secondary | ICD-10-CM

## 2017-01-18 LAB — CBC WITH DIFFERENTIAL/PLATELET
BASOS PCT: 1 %
Basophils Absolute: 0.1 10*3/uL (ref 0.0–0.1)
Eosinophils Absolute: 0.4 10*3/uL (ref 0.0–0.7)
Eosinophils Relative: 7 %
HEMATOCRIT: 28.7 % — AB (ref 36.0–46.0)
Hemoglobin: 9.2 g/dL — ABNORMAL LOW (ref 12.0–15.0)
LYMPHS PCT: 16 %
Lymphs Abs: 0.9 10*3/uL (ref 0.7–4.0)
MCH: 28.9 pg (ref 26.0–34.0)
MCHC: 32.1 g/dL (ref 30.0–36.0)
MCV: 90.3 fL (ref 78.0–100.0)
MONO ABS: 0.4 10*3/uL (ref 0.1–1.0)
MONOS PCT: 7 %
Neutro Abs: 3.9 10*3/uL (ref 1.7–7.7)
Neutrophils Relative %: 69 %
Platelets: 24 10*3/uL — CL (ref 150–400)
RBC: 3.18 MIL/uL — ABNORMAL LOW (ref 3.87–5.11)
RDW: 14.6 % (ref 11.5–15.5)
WBC: 5.6 10*3/uL (ref 4.0–10.5)

## 2017-01-18 MED ORDER — ROMIPLOSTIM 250 MCG ~~LOC~~ SOLR
4.0000 ug/kg | Freq: Once | SUBCUTANEOUS | Status: AC
  Administered 2017-01-18: 11:00:00 290 ug via SUBCUTANEOUS
  Filled 2017-01-18: qty 0.58

## 2017-01-18 NOTE — Progress Notes (Signed)
Hematology: I met with Dr. Kathyrn Drown today when she came in for her lab work. She was started on a trial of Romiplostim (N-Plate) 3 weeks ago at initial dose of 2 mcg/kg. She was escalated to 3 mcg/kg at week 2 with no response. Lab today also shows platelet count unchanged at 24,000. Coincidental with starting this drug she reports extreme fatigue now spending more than 5 hours a day in bed. Skin looks sallow, question icterus? Bruising on the skin of her upper and lower extremities. Fatigue is not listed as a side effect of this drug in the package insert and has not been something I have seen in other patients who have given this product. I'm concerned that there is something else going on. I am going to escalate the dose today up to 4 mcg/kg. Maximum dose permitted for this drug is 10 mcg/kg. However, if I do not find a another reason for her profound fatigue I will be forced to stop this product. We still have not heard about patient assistance for the oral thrombopoietin stimulating drug, Promacta. I will try to get in touch with the pharmaceutical representative today. We discussed other potential options. She is high risk for any surgery but splenectomy would be a rapid way to get a response. A new drug was just approved for non-splenectomized patients with refractory ITP: fosfanitinib. This is a spleen tyrosine kinase inhibitor which works by blocking platelet destruction by binding to complement receptors in the spleen.  I would also anticipate that this would be prohibitively expensive for somebody with limited financial resources and that we would have to look into assistance.

## 2017-01-19 ENCOUNTER — Telehealth: Payer: Self-pay | Admitting: *Deleted

## 2017-01-19 NOTE — Telephone Encounter (Signed)
Pt had called about N-plate appts given in [place] - unable to schedule at last visit. Per [facility], her appts have been scheduled @ 1100AM for next month - confirmed w/Laverne. Laverne stated at last appt , pt was restless/somewhat agitated. Pt stated she has been having "stomach problems" and she re-started taking Pantoprazole 40 mg.

## 2017-01-19 NOTE — Telephone Encounter (Signed)
Called pt - talked to her husband, stated she's sleeping. Informed husband Dr Darnell Level would like to see her on Tuesday @ 3PM; stated ok, but will call back if pt is unable to come.

## 2017-01-19 NOTE — Telephone Encounter (Signed)
See if she can come in to see me Tues around 3 PM. I am concerned something going on not related to N-Plate

## 2017-01-22 ENCOUNTER — Telehealth: Payer: Self-pay | Admitting: Cardiology

## 2017-01-22 DIAGNOSIS — R002 Palpitations: Secondary | ICD-10-CM

## 2017-01-22 NOTE — Telephone Encounter (Signed)
Spoke to patient. Patient states she has had 3 episodes recently  Similar to when she collapsed, she states she becomes dizzy and then assisted to bed and recovers.  patient states she has an appointment with Dr Beryle Beams tomorrow and an appointment with Dr Rayann Heman on 8/ 22/18. PATIENT WANTED DR CRENSHAW OPINION on what to do?  Informed patient Dr Stanford Breed not in office , offered patient an appointment with  App/extender. Patient decline and states  she will see Dr Beryle Beams tomorrow . If anything occurs she will call back

## 2017-01-22 NOTE — Telephone Encounter (Signed)
Would arrange event monitor [name]

## 2017-01-22 NOTE — Telephone Encounter (Signed)
Pt having symptoms like she had before,she needs to discuss this with you please.

## 2017-01-23 ENCOUNTER — Telehealth: Payer: Self-pay | Admitting: *Deleted

## 2017-01-23 ENCOUNTER — Ambulatory Visit (INDEPENDENT_AMBULATORY_CARE_PROVIDER_SITE_OTHER): Payer: Medicare Other | Admitting: Oncology

## 2017-01-23 ENCOUNTER — Encounter: Payer: Self-pay | Admitting: Oncology

## 2017-01-23 DIAGNOSIS — Z87891 Personal history of nicotine dependence: Secondary | ICD-10-CM | POA: Diagnosis not present

## 2017-01-23 DIAGNOSIS — Z8249 Family history of ischemic heart disease and other diseases of the circulatory system: Secondary | ICD-10-CM

## 2017-01-23 DIAGNOSIS — Z79899 Other long term (current) drug therapy: Secondary | ICD-10-CM | POA: Diagnosis not present

## 2017-01-23 DIAGNOSIS — Z833 Family history of diabetes mellitus: Secondary | ICD-10-CM

## 2017-01-23 DIAGNOSIS — D6861 Antiphospholipid syndrome: Secondary | ICD-10-CM | POA: Diagnosis not present

## 2017-01-23 DIAGNOSIS — D649 Anemia, unspecified: Secondary | ICD-10-CM | POA: Diagnosis not present

## 2017-01-23 DIAGNOSIS — D693 Immune thrombocytopenic purpura: Secondary | ICD-10-CM | POA: Diagnosis present

## 2017-01-23 DIAGNOSIS — Z888 Allergy status to other drugs, medicaments and biological substances status: Secondary | ICD-10-CM | POA: Diagnosis not present

## 2017-01-23 LAB — COMPREHENSIVE METABOLIC PANEL
ALK PHOS: 117 U/L (ref 38–126)
ALT: 18 U/L (ref 14–54)
AST: 25 U/L (ref 15–41)
Albumin: 3.4 g/dL — ABNORMAL LOW (ref 3.5–5.0)
Anion gap: 9 (ref 5–15)
BUN: 51 mg/dL — AB (ref 6–20)
CALCIUM: 9 mg/dL (ref 8.9–10.3)
CHLORIDE: 100 mmol/L — AB (ref 101–111)
CO2: 27 mmol/L (ref 22–32)
CREATININE: 2.6 mg/dL — AB (ref 0.44–1.00)
GFR calc Af Amer: 20 mL/min — ABNORMAL LOW (ref >60)
GFR calc non Af Amer: 18 mL/min — ABNORMAL LOW (ref >60)
Glucose, Bld: 111 mg/dL — ABNORMAL HIGH (ref 65–99)
Potassium: 4.4 mmol/L (ref 3.5–5.1)
SODIUM: 136 mmol/L (ref 135–145)
Total Bilirubin: 0.7 mg/dL (ref 0.3–1.2)
Total Protein: 6.6 g/dL (ref 6.5–8.1)

## 2017-01-23 LAB — MAGNESIUM: Magnesium: 2 mg/dL (ref 1.7–2.4)

## 2017-01-23 LAB — CBC WITH DIFFERENTIAL/PLATELET
BASOS ABS: 0.1 10*3/uL (ref 0.0–0.1)
Basophils Relative: 1 %
EOS ABS: 0.3 10*3/uL (ref 0.0–0.7)
EOS PCT: 5 %
HCT: 30.5 % — ABNORMAL LOW (ref 36.0–46.0)
HEMOGLOBIN: 9.7 g/dL — AB (ref 12.0–15.0)
LYMPHS ABS: 0.9 10*3/uL (ref 0.7–4.0)
LYMPHS PCT: 13 %
MCH: 27.8 pg (ref 26.0–34.0)
MCHC: 31.8 g/dL (ref 30.0–36.0)
MCV: 87.4 fL (ref 78.0–100.0)
Monocytes Absolute: 0.4 10*3/uL (ref 0.1–1.0)
Monocytes Relative: 5 %
NEUTROS PCT: 76 %
Neutro Abs: 5.4 10*3/uL (ref 1.7–7.7)
PLATELETS: 19 10*3/uL — AB (ref 150–400)
RBC: 3.49 MIL/uL — AB (ref 3.87–5.11)
RDW: 14.2 % (ref 11.5–15.5)
WBC: 7.1 10*3/uL (ref 4.0–10.5)

## 2017-01-23 LAB — DIRECT ANTIGLOBULIN TEST (NOT AT ARMC)
DAT, COMPLEMENT: NEGATIVE
DAT, IGG: NEGATIVE

## 2017-01-23 LAB — LACTATE DEHYDROGENASE: LDH: 296 U/L — ABNORMAL HIGH (ref 98–192)

## 2017-01-23 LAB — RETICULOCYTES
RBC.: 3.49 MIL/uL — ABNORMAL LOW (ref 3.87–5.11)
Retic Count, Absolute: 41.9 10*3/uL (ref 19.0–186.0)
Retic Ct Pct: 1.2 % (ref 0.4–3.1)

## 2017-01-23 NOTE — Progress Notes (Signed)
Called [facility]  - talked to [facility], N-plate appt scheduled Thursday 8/2 canceled. Per Dr Beryle Beams

## 2017-01-23 NOTE — Progress Notes (Signed)
Hematology and Oncology Follow Up Visit  [name] [id number] 06-22-1946 71 y.o. 01/23/2017 6:35 PM   Principle Diagnosis: Encounter Diagnoses  Name Primary?  . Chronic ITP (idiopathic thrombocytopenia) (HCC)   . Antiphospholipid antibody syndrome ([place])   . Normochromic anemia      Interim History: I called [name] in today along with her husband.  I started her on Romiplostim for acute on chronic thrombocytopenia which I felt was immune in nature given her long-standing antiphospholipid antibody syndrome.  She had a very brief and minor response to high-dose dexamethasone with transient rise in her platelet count from 23,000 up to 71,000 which then fell back down again to 34,000 within 5 weeks.  Romiplostim injections started on July 12 at 2 mcg/kg, increased to 3 mcg/kg on July 19 and then to 4 mcg/kg on July 26.  Concomitant with the initiation of this drug she developed profound fatigue and is now spending the majority of her day in bed.  Her platelet count has not improved, in fact today after the patient left, platelet count returned at 19,000. She reports 3 episodes of sudden syncope over the last week with no associated prodrome, no dyspnea, no palpitations, no chest pain. She has had no recent infections.  No fevers. She has had no clinical bleeding despite low platelet count and the fact that she is on full dose anticoagulation in view of a mechanical mitral valve and previous TIAs and a right ischemic stroke in the past.  Medications: reviewed  Allergies:  Allergies  Allergen Reactions  . Amiodarone Hcl Other (See Comments)    "torsades; v tach"  . 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 Vitamin K Anaphylaxis    IV only allergy  . Iodinated Diagnostic Agents Itching    "over 35 years ago" (03/28/2012)    Review of Systems: See interim history: She has been having some nonspecific right anterior flank discomfort. Remaining ROS negative:   Physical Exam: Blood  pressure 140/89, pulse 82, temperature 97.9 F (36.6 C), temperature source Oral, height 5\' 3"  (1.6 m), weight 159 lb 3.2 oz (72.2 kg), SpO2 100 %. Wt Readings from Last 3 Encounters:  01/23/17 159 lb 3.2 oz (72.2 kg)  01/18/17 160 lb (72.6 kg)  01/04/17 162 lb (73.5 kg)     General appearance: She now appears chronically ill.  She has been crying and there are patches of erythema bilaterally lower periorbital regions. HENNT: Pharynx no erythema, exudate, mass, or ulcer. No thyromegaly or thyroid nodules Lymph nodes: No cervical, supraclavicular, or axillary lymphadenopathy Breasts: Lungs: Clear to auscultation, resonant to percussion throughout.  Cardiac: Mechanical heart valve sound.  Note recent echocardiogram done in June showed valve was functioning well.  Regular rhythm, no murmur, no gallop, no rub, no click, no edema Abdomen: Soft, nontender, normal bowel sounds, no mass, no organomegaly Extremities: No edema, no calf tenderness Musculoskeletal: no joint deformities GU:  Vascular: Carotid pulses 2+, no bruits, distal pulses: Dorsalis pedis 1+ symmetric Neurologic: Alert, oriented, PERRLA, optic discs not visualized, vessels normal, no hemorrhage or exudate, cranial nerves grossly normal, motor strength 5 over 5, reflexes 1+ symmetric, upper body coordination normal, gait normal, Skin: No rash; scattered ecchymosis on the skin of her arms and legs.  Lab Results: CBC W/Diff    Component Value Date/Time   WBC 7.1 01/23/2017 1456   RBC 3.49 (L) 01/23/2017 1456   RBC 3.49 (L) 01/23/2017 1456   HGB 9.7 (L) 01/23/2017 1456  HGB 9.6 (L) 10/03/2016 1114   HGB 10.7 (L) 08/02/2015 1315   HCT 30.5 (L) 01/23/2017 1456   HCT 28.1 (L) 10/03/2016 1114   HCT 33.2 (L) 08/02/2015 1315   PLT 19 (LL) 01/23/2017 1456   PLT 22 (LL) 10/03/2016 1114   MCV 87.4 01/23/2017 1456   MCV 87 10/03/2016 1114   MCV 93.8 08/02/2015 1315   MCH 27.8 01/23/2017 1456   MCHC 31.8 01/23/2017 1456   RDW  14.2 01/23/2017 1456   RDW 13.8 10/03/2016 1114   RDW 12.7 08/02/2015 1315   LYMPHSABS 0.9 01/23/2017 1456   LYMPHSABS 1.0 10/03/2016 1114   LYMPHSABS 0.9 08/02/2015 1315   MONOABS 0.4 01/23/2017 1456   MONOABS 0.4 08/02/2015 1315   EOSABS 0.3 01/23/2017 1456   EOSABS 0.3 10/03/2016 1114   BASOSABS 0.1 01/23/2017 1456   BASOSABS 0.1 10/03/2016 1114   BASOSABS 0.1 08/02/2015 1315     Chemistry      Component Value Date/Time   NA 136 01/23/2017 1456   NA 141 11/29/2016 1049   NA 142 08/02/2015 1315   K 4.4 01/23/2017 1456   K 4.4 08/02/2015 1315   CL 100 (L) 01/23/2017 1456   CL 108 (H) 11/04/2012 1158   CO2 27 01/23/2017 1456   CO2 25 08/02/2015 1315   BUN 51 (H) 01/23/2017 1456   BUN 42 (H) 11/29/2016 1049   BUN 62.9 (H) 08/02/2015 1315   CREATININE 2.60 (H) 01/23/2017 1456   CREATININE 1.9 (H) 08/02/2015 1315      Component Value Date/Time   CALCIUM 9.0 01/23/2017 1456   CALCIUM 9.1 08/02/2015 1315   ALKPHOS 117 01/23/2017 1456   ALKPHOS 103 08/02/2015 1315   AST 25 01/23/2017 1456   AST 36 (H) 08/02/2015 1315   ALT 18 01/23/2017 1456   ALT 31 08/02/2015 1315   BILITOT 0.7 01/23/2017 1456   BILITOT 0.4 11/29/2016 1049   BILITOT 0.60 08/02/2015 1315     Direct Coombs test negative.  Bilirubin 0.7.  Persistent elevation of LDH at 296 with normal transaminase enzymes.  (This likely reflects some chronic low-grade hemolysis off her mechanical valve).  Creatinine now rising up to 2.6.  Estimated GFR 18 mL/min   Radiological Studies: No results found.  Impression: Persistent, significant, thrombocytopenia with no response to Romiplostim thrombopoietin agonist. Dramatic deterioration in performance status coincident with starting this drug.  Although this is not a recognized side effect, I have to assume that she is having an atypical medication reaction in the absence of any other obvious physical or laboratory change.  I am going to have to stop the Romiplostim.  I  will try to obtain the alternative TPO agonist, Promacta. In the interim, I would like to give her a dose of intravenous immunoglobulin to see if we can get her platelet count into a safe range before she has a major hemorrhagic complication. I will also need to check a low molecular weight heparin level in view of her progressive renal deterioration.  Dose adjustment as indicated. She is understandably depressed with the rapid deterioration in the quality of her life.  She is already indicated that she would not want to be on dialysis under any circumstances.    CC: Patient Care Team: Josetta Huddle, MD as PCP - General (Internal Medicine) Beryle Beams Alyson Locket, MD as Consulting Physician (Oncology) Jamal Maes, MD as Consulting Physician (Nephrology) Stanford Breed Denice Bors, MD as Consulting Physician (Cardiology) Thompson Grayer, MD as Consulting Physician (Cardiology)  Murriel Hopper, MD, [place]  Hematology-Oncology/Internal Medicine     7/31/20186:35 PM

## 2017-01-23 NOTE — Telephone Encounter (Signed)
Spoke with pt, event monitor scheduled for Thursday this week.

## 2017-01-23 NOTE — Patient Instructions (Signed)
Lab & visit 3-4 weeks

## 2017-01-23 NOTE — Addendum Note (Signed)
Addended by: Cristopher Estimable on: 01/23/2017 08:32 AM   Modules accepted: Orders

## 2017-01-23 NOTE — Telephone Encounter (Signed)
Call from main lab - platelets are 19; Dr Beryle Beams informed.

## 2017-01-24 ENCOUNTER — Other Ambulatory Visit: Payer: Self-pay | Admitting: Oncology

## 2017-01-24 DIAGNOSIS — D693 Immune thrombocytopenic purpura: Secondary | ICD-10-CM

## 2017-01-24 DIAGNOSIS — D6861 Antiphospholipid syndrome: Secondary | ICD-10-CM

## 2017-01-25 ENCOUNTER — Ambulatory Visit (INDEPENDENT_AMBULATORY_CARE_PROVIDER_SITE_OTHER): Payer: Medicare Other

## 2017-01-25 ENCOUNTER — Encounter (HOSPITAL_COMMUNITY): Payer: Medicare Other

## 2017-01-25 ENCOUNTER — Encounter (HOSPITAL_COMMUNITY)
Admission: RE | Admit: 2017-01-25 | Discharge: 2017-01-25 | Disposition: A | Discharge status: Home / Self Care | Payer: Medicare Other | Source: Ambulatory Visit | Attending: Oncology | Admitting: Oncology

## 2017-01-25 ENCOUNTER — Other Ambulatory Visit: Payer: Self-pay | Admitting: Oncology

## 2017-01-25 ENCOUNTER — Other Ambulatory Visit (HOSPITAL_COMMUNITY): Payer: Self-pay

## 2017-01-25 DIAGNOSIS — D6861 Antiphospholipid syndrome: Secondary | ICD-10-CM

## 2017-01-25 DIAGNOSIS — D693 Immune thrombocytopenic purpura: Secondary | ICD-10-CM | POA: Diagnosis not present

## 2017-01-25 DIAGNOSIS — D6859 Other primary thrombophilia: Secondary | ICD-10-CM

## 2017-01-25 DIAGNOSIS — Z7901 Long term (current) use of anticoagulants: Secondary | ICD-10-CM

## 2017-01-25 DIAGNOSIS — R002 Palpitations: Secondary | ICD-10-CM | POA: Diagnosis not present

## 2017-01-25 MED ORDER — IMMUNE GLOBULIN (HUMAN) 20 GM/200ML IV SOLN
1.0000 g/kg | INTRAVENOUS | Status: DC
  Administered 2017-01-25: 70 g via INTRAVENOUS
  Filled 2017-01-25: qty 100

## 2017-01-25 MED ORDER — FUROSEMIDE 10 MG/ML IJ SOLN
40.0000 mg | Freq: Once | INTRAMUSCULAR | Status: AC
  Administered 2017-01-25: 40 mg via INTRAVENOUS

## 2017-01-25 MED ORDER — METHYLPREDNISOLONE SODIUM SUCC 40 MG IJ SOLR
40.0000 mg | Freq: Once | INTRAMUSCULAR | Status: AC
  Administered 2017-01-25: 40 mg via INTRAVENOUS

## 2017-01-25 MED ORDER — ACETAMINOPHEN 325 MG PO TABS: 650.0000 mg | ORAL_TABLET | Freq: Once | ORAL | Status: DC

## 2017-01-25 MED ORDER — FUROSEMIDE 10 MG/ML IJ SOLN
INTRAMUSCULAR | Status: AC
  Administered 2017-01-25: 40 mg via INTRAVENOUS
  Filled 2017-01-25: qty 4

## 2017-01-25 MED ORDER — METHYLPREDNISOLONE SODIUM SUCC 40 MG IJ SOLR
INTRAMUSCULAR | Status: AC
  Administered 2017-01-25: 09:00:00 40 mg via INTRAVENOUS
  Filled 2017-01-25: qty 1

## 2017-01-25 NOTE — Progress Notes (Signed)
cbc

## 2017-01-25 NOTE — Discharge Instructions (Signed)

## 2017-01-26 ENCOUNTER — Other Ambulatory Visit (INDEPENDENT_AMBULATORY_CARE_PROVIDER_SITE_OTHER): Payer: Medicare Other

## 2017-01-26 ENCOUNTER — Encounter (HOSPITAL_COMMUNITY)
Admission: RE | Admit: 2017-01-26 | Discharge: 2017-01-26 | Disposition: A | Discharge status: Home / Self Care | Payer: Medicare Other | Source: Ambulatory Visit | Attending: Oncology | Admitting: Oncology

## 2017-01-26 DIAGNOSIS — Z7901 Long term (current) use of anticoagulants: Secondary | ICD-10-CM

## 2017-01-26 DIAGNOSIS — D6859 Other primary thrombophilia: Secondary | ICD-10-CM

## 2017-01-26 DIAGNOSIS — D6861 Antiphospholipid syndrome: Secondary | ICD-10-CM

## 2017-01-26 DIAGNOSIS — D693 Immune thrombocytopenic purpura: Secondary | ICD-10-CM

## 2017-01-26 LAB — CBC WITH DIFFERENTIAL/PLATELET
BASOS PCT: 0 %
Basophils Absolute: 0 10*3/uL (ref 0.0–0.1)
Eosinophils Absolute: 0.1 10*3/uL (ref 0.0–0.7)
Eosinophils Relative: 1 %
HEMATOCRIT: 27.9 % — AB (ref 36.0–46.0)
Hemoglobin: 8.8 g/dL — ABNORMAL LOW (ref 12.0–15.0)
LYMPHS ABS: 0.7 10*3/uL (ref 0.7–4.0)
Lymphocytes Relative: 7 %
MCH: 28.1 pg (ref 26.0–34.0)
MCHC: 31.5 g/dL (ref 30.0–36.0)
MCV: 89.1 fL (ref 78.0–100.0)
MONO ABS: 0.2 10*3/uL (ref 0.1–1.0)
MONOS PCT: 2 %
NEUTROS ABS: 8.3 10*3/uL — AB (ref 1.7–7.7)
Neutrophils Relative %: 90 %
Platelets: 131 10*3/uL — ABNORMAL LOW (ref 150–400)
RBC: 3.13 MIL/uL — ABNORMAL LOW (ref 3.87–5.11)
RDW: 14.9 % (ref 11.5–15.5)
WBC: 9.3 10*3/uL (ref 4.0–10.5)

## 2017-01-26 LAB — HEPARIN ANTI-XA: Heparin LMW: 0.8 IU/mL

## 2017-01-26 MED ORDER — METHYLPREDNISOLONE SODIUM SUCC 40 MG IJ SOLR
40.0000 mg | Freq: Once | INTRAMUSCULAR | Status: AC
  Administered 2017-01-26: 40 mg via INTRAVENOUS

## 2017-01-26 MED ORDER — ACETAMINOPHEN 325 MG PO TABS
650.0000 mg | ORAL_TABLET | Freq: Once | ORAL | Status: AC
  Administered 2017-01-26: 650 mg via ORAL

## 2017-01-26 MED ORDER — METHYLPREDNISOLONE SODIUM SUCC 40 MG IJ SOLR
INTRAMUSCULAR | Status: AC
  Administered 2017-01-26: 40 mg via INTRAVENOUS
  Filled 2017-01-26: qty 1

## 2017-01-26 MED ORDER — FUROSEMIDE 10 MG/ML IJ SOLN
INTRAMUSCULAR | Status: AC
  Filled 2017-01-26: qty 4

## 2017-01-26 MED ORDER — IMMUNE GLOBULIN (HUMAN) 20 GM/200ML IV SOLN
1.0000 g/kg | INTRAVENOUS | Status: DC
  Administered 2017-01-26: 10:00:00 70 g via INTRAVENOUS
  Filled 2017-01-26: qty 100

## 2017-01-26 MED ORDER — FUROSEMIDE 10 MG/ML IJ SOLN
40.0000 mg | Freq: Once | INTRAMUSCULAR | Status: AC
  Administered 2017-01-26: 40 mg via INTRAVENOUS

## 2017-01-26 MED ORDER — ACETAMINOPHEN 325 MG PO TABS
ORAL_TABLET | ORAL | Status: AC
  Filled 2017-01-26: qty 2

## 2017-01-26 NOTE — Addendum Note (Signed)
Addended by: Truddie Crumble on: 01/26/2017 01:27 PM   Modules accepted: Orders

## 2017-01-26 NOTE — Progress Notes (Signed)
HPI: FU rheumatic heart disease, atrial tachycardia/flutter, and combined systolic/diastolic congestive heart failure. The patient had mitral valvuloplasty at [facility] in the late 90s. In 2007 she had a mechanical mitral valve replacement at [facility]. 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 She's had a prior subdural hematoma that required evacuation. She also has a history of heparin-induced thrombocytopenia. She is followed by Dr Rayann Heman for SVT and is s/p ablation at [facility]; also with h/o torsades with amiodarone. Has had previous biventricular pacemaker. Nuclear study October 2015 showed an ejection fraction of 35%. There was a fixed anterior/apical defect consistent with scar. No ischemia. Echo 6/16 showed EF 35-40, trace AI, mechanical MV, moderate to severe LAE, mildly reduced RV function. Patient is very volume sensitive. 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 She is followed closely by Dr. Beryle Beams. Developed severe fatigue with Romiplostim. There is no improvement in her platelet count. Since last seen, patient was placed on n plate transiently for thrombocytopenia which caused severe weakness. During that time she had 3 separate episodes of near syncope. She would feel suddenly dizzy and there was no associated palpitations, chest pain, dyspnea and she did not have frank syncope. Her symptoms would last 30 seconds to 1 minute and she felt weak afterwards. Since discontinuing this medication she has not had any further episodes. She has mild dyspnea on exertion but no orthopnea, PND, pedal edema or chest pain. Note she does state that her events were similar to her VT experiences.   Current Outpatient Prescriptions  Medication Sig Dispense Refill  . acetaminophen (TYLENOL) 650 MG CR tablet Take 1,300 mg by mouth 3 (three) times daily as needed  for pain.     Marland Kitchen AMOXICILLIN PO Take 4 capsules by mouth as directed. ONLY WHEN VISITING DENTIST    . Calcium Citrate (CITRACAL PO) Take 1 tablet by mouth daily.    . Cholecalciferol (VITAMIN D) 2000 UNITS tablet Take 2,000 Units by mouth daily.      . Coenzyme Q-10 100 MG capsule Take 100 mg by mouth daily.    . Estradiol Acetate (FEMRING) 0.05 MG/24HR RING Place 1 each vaginally as directed.     . fondaparinux (ARIXTRA) 2.5 MG/0.5ML SOLN injection INJECT 0.5ML INTO THE SKIN EVERY OTHER DAY 9.5 mL 11  . furosemide (LASIX) 20 MG tablet Take 1 tablet (20 mg total) by mouth daily. 90 tablet 1  . levothyroxine (SYNTHROID, LEVOTHROID) 88 MCG tablet Take 88 mcg by mouth daily before breakfast.    . metoprolol succinate (TOPROL-XL) 50 MG 24 hr tablet Take 1 tablet (50 mg total) by mouth 2 (two) times daily. Take with or immediately following a meal. 180 tablet 3  . Misc Natural Products (TART CHERRY ADVANCED PO) Take 2 tablets by mouth daily.    . Omega-3 Fatty Acids (OMEGA-3 FISH OIL) 500 MG CAPS Take 1 capsule by mouth daily.    . valACYclovir (VALTREX) 1000 MG tablet Take 1,000 mg by mouth 2 (two) times daily. For a total of 3 doses for labial herpes prn only     No current facility-administered medications for this visit.      Past Medical History:  Diagnosis Date  . Acute ITP ([place]) 10/09/2016  . Anemia   . Antiphospholipid antibody syndrome ([place])   . Arthritis    "may have some" (03/28/2012)  . Atrial  flutter ([place])    atypical right atrial flutter s/p ablation at [facility] by Dr Clyda Hurdle  . Cerebrovascular accident [facility]) ?1990; 1993; 2007   residual "can't use my right hand; left visual field cut" (03/28/2012)  . CHF (congestive heart failure) ([place])   . 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 Chronic kidney disease    "as a result of my heart failing" (03/28/2012)  . Fine motor impairment    "fingers right hand" (03/28/2012)  . Heart murmur   . Heparin-induced  thrombocytopenia ([place])   . History of blood transfusion    "lots; most in 2007 w/MVR OR"  . History of Hashimoto thyroiditis   . HLD (hyperlipidemia)   . HTN (hypertension)   . Hypothyroidism   . 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 Pacemaker   . Rheumatic heart disease 1995   post percutaneous valvuloplasty at [facility] with subsequent mechanical mitral valve replacement and tricuspid annuloplasty at [facility]  . Right leg swelling 10/06/2013   Asymmetric; no calf pain, venous doppler negative for DVT 10/06/13  . Subdural hematoma ([place]) 2009   on coumadin    Past Surgical History:  Procedure Laterality Date  . AMPUTATION     left atrial appendage amputated with MVR  . ATRIAL ABLATION SURGERY     CTI and R atriostomy scar flutter ablation at [facility] 8/12,  repeat  ablation at [facility]  9/12  . BI-VENTRICULAR PACEMAKER INSERTION N/A 03/28/2012   Procedure: BI-VENTRICULAR PACEMAKER INSERTION (CRT-P);  Surgeon: Evans Lance, MD;  Location: [facility] CATH LAB;  Service: Cardiovascular;  Laterality: N/A;  . biventricular pacemaker placement  03/28/2012   SJM Anthem implanted by Dr Lovena Le  . CARDIAC ELECTROPHYSIOLOGY [place] AND ABLATION  2012   "2 @ [facility]" (03/28/2012  . CARDIAC VALVE REPLACEMENT    . CARDIOVERSION  2010-2011   "3 at [facility]" (03/28/2012)  . CHOLECYSTECTOMY    . CRANIOTOMY  2008   for SDH  . MITRAL VALVE REPLACEMENT  2007   with tricuspid annuloplasty  . [place] with subsequent mechanical mitral valve replacement and tricuspid annuloplasty at [facility]  . TONSILLECTOMY    . TONSILLECTOMY     "when I was a child"  . TOTAL ABDOMINAL HYSTERECTOMY  ?1995   TAH; BSO    Social History   Social History  . Marital status: Married    Spouse name: Alease Medina   . Number of children: 0  . Years of education: college   Occupational History  . PHYSICIAN Retired    retired   Social History Main Topics  .  Smoking status: Former Smoker    Packs/day: 0.10    Years: 6.00    Types: Cigarettes  . Smokeless tobacco: Never Used     Comment: 03/28/2012 "smoked in the 1970's"  . Alcohol use 0.0 oz/week     Comment: wine rarely.  . Drug use: No  . Sexual activity: Not on file   Other Topics Concern  . 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  Quit smoking 25 years ago and quit alcohol 2 years ago.   Denies alcohol, caffeine,  and illicit drug use.      Family History  Problem Relation Age of Onset  . Heart disease Father        late 72's  early 80's-CABG  . Dementia Father   . Diabetes Father     ROS: Fatigue but no fevers or chills, productive cough, hemoptysis, dysphasia, odynophagia, melena, hematochezia, dysuria, hematuria, rash, seizure activity, orthopnea, PND, pedal edema, claudication. Remaining systems are negative.  Physical Exam: Well-developed well-nourished in no acute distress.  Skin is warm and dry.  HEENT is normal.  Neck is supple.  Chest is clear to auscultation with normal expansion.  Cardiovascular exam is regular rate and rhythm. Crisp mechanical valve sound  Abdominal exam nontender or distended. No masses palpated. Extremities show no edema. neuro grossly intact  ECG- Ventricular pacing with underlying atrial fibrillation. personally reviewed  A/P  1 Near syncope- I have personally reviewed the patient's monitor to date. She has atrial fibrillation with ventricular pacing and PVCs but no pauses or sustained ventricular arrhythmias. Plan repeat echo to reassess LV function. Her episodes occurred during the time she was on N plate for thrombocytopenia. She was weak during that time and I wonder if this contributed. However she states these episodes were similar to when she had her torsades on amiodarone. I have reviewed side effects from N plate and there apparently is no QT  prolongation. It can cause dizziness. Continue 30 day monitor. She is scheduled to see Dr. Rayann Heman in 9 days. If LV function reduced may need upgrade to ICD.   2 chronic combined systolic/diastolic congestive heart failure-volume status appears to be stable. Continue present dose of diuretics. Renal function monitored by nephrology.  2 status post mitral valve replacement-continue anticoagulation which is followed by hematology. Continue SBE prophylaxis.  3 hypertension-blood pressure is controlled. Continue present medications.  4 cardiomyopathy-continue beta blocker. No ACE inhibitor or ARB because of renal insufficiency. We have not been able to add hydralazine and nitrates because of borderline blood pressure.  5 permanent atrial fibrillation-continue beta blocker for rate control. Continue anticoagulation.  6 status post biventricular pacemaker-managed by electrophysiology.  7 thrombocytopenia-management per hematology.     Kirk Ruths, MD

## 2017-01-29 ENCOUNTER — Encounter: Payer: Self-pay | Admitting: *Deleted

## 2017-01-29 ENCOUNTER — Encounter: Payer: Self-pay | Admitting: Internal Medicine

## 2017-02-01 ENCOUNTER — Encounter (HOSPITAL_COMMUNITY): Payer: Medicare Other

## 2017-02-05 ENCOUNTER — Telehealth: Payer: Self-pay | Admitting: *Deleted

## 2017-02-05 ENCOUNTER — Ambulatory Visit (INDEPENDENT_AMBULATORY_CARE_PROVIDER_SITE_OTHER): Payer: Medicare Other | Admitting: Cardiology

## 2017-02-05 ENCOUNTER — Encounter: Payer: Self-pay | Admitting: Cardiology

## 2017-02-05 VITALS — BP 138/86 | HR 80 | Ht 63.0 in | Wt 160.0 lb

## 2017-02-05 DIAGNOSIS — I4891 Unspecified atrial fibrillation: Secondary | ICD-10-CM

## 2017-02-05 DIAGNOSIS — I519 Heart disease, unspecified: Secondary | ICD-10-CM | POA: Diagnosis not present

## 2017-02-05 DIAGNOSIS — N2581 Secondary hyperparathyroidism of renal origin: Secondary | ICD-10-CM | POA: Diagnosis not present

## 2017-02-05 DIAGNOSIS — I428 Other cardiomyopathies: Secondary | ICD-10-CM | POA: Diagnosis not present

## 2017-02-05 DIAGNOSIS — D693 Immune thrombocytopenic purpura: Secondary | ICD-10-CM | POA: Diagnosis not present

## 2017-02-05 DIAGNOSIS — Z7901 Long term (current) use of anticoagulants: Secondary | ICD-10-CM | POA: Diagnosis not present

## 2017-02-05 DIAGNOSIS — I10 Essential (primary) hypertension: Secondary | ICD-10-CM | POA: Diagnosis not present

## 2017-02-05 DIAGNOSIS — I4892 Unspecified atrial flutter: Secondary | ICD-10-CM | POA: Diagnosis not present

## 2017-02-05 DIAGNOSIS — R55 Syncope and collapse: Secondary | ICD-10-CM

## 2017-02-05 DIAGNOSIS — I5042 Chronic combined systolic (congestive) and diastolic (congestive) heart failure: Secondary | ICD-10-CM

## 2017-02-05 DIAGNOSIS — Z6829 Body mass index (BMI) 29.0-29.9, adult: Secondary | ICD-10-CM | POA: Diagnosis not present

## 2017-02-05 DIAGNOSIS — D631 Anemia in chronic kidney disease: Secondary | ICD-10-CM | POA: Diagnosis not present

## 2017-02-05 DIAGNOSIS — N189 Chronic kidney disease, unspecified: Secondary | ICD-10-CM | POA: Diagnosis not present

## 2017-02-05 NOTE — Patient Instructions (Signed)
Medication Instructions:   NO CHANGE  Testing/Procedures:  Your physician has requested that you have an echocardiogram. Echocardiography is a painless test that uses sound waves to create images of your heart. It provides your doctor with information about the size and shape of your heart and how well your heart's chambers and valves are working. This procedure takes approximately one hour. There are no restrictions for this procedure.    Follow-Up:  Your physician recommends that you schedule a follow-up appointment in: 8-12 [place]   If you need a refill on your cardiac medications before your next appointment, please call your pharmacy.

## 2017-02-05 NOTE — Telephone Encounter (Addendum)
Received call from [employer] Rep Mosetta Pigeon ([email or web address]) inquiring about patient's enrollment in the the patient assistance program for promacta. She will be looking into patient assistance program on pt's behalf.

## 2017-02-08 ENCOUNTER — Encounter (HOSPITAL_COMMUNITY): Payer: Medicare Other

## 2017-02-12 ENCOUNTER — Other Ambulatory Visit (INDEPENDENT_AMBULATORY_CARE_PROVIDER_SITE_OTHER): Payer: Medicare Other

## 2017-02-12 ENCOUNTER — Ambulatory Visit (INDEPENDENT_AMBULATORY_CARE_PROVIDER_SITE_OTHER): Payer: Medicare Other | Admitting: *Deleted

## 2017-02-12 DIAGNOSIS — I495 Sick sinus syndrome: Secondary | ICD-10-CM

## 2017-02-12 DIAGNOSIS — D693 Immune thrombocytopenic purpura: Secondary | ICD-10-CM

## 2017-02-12 DIAGNOSIS — D649 Anemia, unspecified: Secondary | ICD-10-CM

## 2017-02-12 DIAGNOSIS — D6861 Antiphospholipid syndrome: Secondary | ICD-10-CM

## 2017-02-12 LAB — CBC WITH DIFFERENTIAL/PLATELET
BASOS PCT: 1 %
Basophils Absolute: 0 10*3/uL (ref 0.0–0.1)
EOS ABS: 0.3 10*3/uL (ref 0.0–0.7)
EOS PCT: 5 %
HCT: 31.1 % — ABNORMAL LOW (ref 36.0–46.0)
HEMOGLOBIN: 9.6 g/dL — AB (ref 12.0–15.0)
Lymphocytes Relative: 15 %
Lymphs Abs: 0.9 10*3/uL (ref 0.7–4.0)
MCH: 28.2 pg (ref 26.0–34.0)
MCHC: 30.9 g/dL (ref 30.0–36.0)
MCV: 91.2 fL (ref 78.0–100.0)
MONOS PCT: 4 %
Monocytes Absolute: 0.3 10*3/uL (ref 0.1–1.0)
NEUTROS PCT: 75 %
Neutro Abs: 4.4 10*3/uL (ref 1.7–7.7)
PLATELETS: 45 10*3/uL — AB (ref 150–400)
RBC: 3.41 MIL/uL — ABNORMAL LOW (ref 3.87–5.11)
RDW: 16 % — AB (ref 11.5–15.5)
WBC: 5.9 10*3/uL (ref 4.0–10.5)

## 2017-02-12 LAB — HEPARIN ANTI-XA: HEPARIN LMW: 0.58 [IU]/mL

## 2017-02-12 NOTE — Addendum Note (Signed)
Addended by: Truddie Crumble on: 02/12/2017 08:44 AM   Modules accepted: Orders

## 2017-02-13 NOTE — Progress Notes (Signed)
Remote pacemaker transmission.   

## 2017-02-14 ENCOUNTER — Ambulatory Visit (INDEPENDENT_AMBULATORY_CARE_PROVIDER_SITE_OTHER): Payer: Medicare Other | Admitting: Internal Medicine

## 2017-02-14 ENCOUNTER — Other Ambulatory Visit: Payer: Self-pay

## 2017-02-14 ENCOUNTER — Ambulatory Visit (HOSPITAL_COMMUNITY): Payer: Medicare Other | Attending: Cardiology

## 2017-02-14 ENCOUNTER — Encounter: Payer: Self-pay | Admitting: Internal Medicine

## 2017-02-14 VITALS — BP 124/62 | HR 86 | Ht 63.0 in | Wt 161.0 lb

## 2017-02-14 DIAGNOSIS — I509 Heart failure, unspecified: Secondary | ICD-10-CM | POA: Insufficient documentation

## 2017-02-14 DIAGNOSIS — I481 Persistent atrial fibrillation: Secondary | ICD-10-CM

## 2017-02-14 DIAGNOSIS — I272 Pulmonary hypertension, unspecified: Secondary | ICD-10-CM | POA: Insufficient documentation

## 2017-02-14 DIAGNOSIS — I429 Cardiomyopathy, unspecified: Secondary | ICD-10-CM | POA: Diagnosis not present

## 2017-02-14 DIAGNOSIS — I082 Rheumatic disorders of both aortic and tricuspid valves: Secondary | ICD-10-CM | POA: Diagnosis not present

## 2017-02-14 DIAGNOSIS — I4891 Unspecified atrial fibrillation: Secondary | ICD-10-CM | POA: Diagnosis not present

## 2017-02-14 DIAGNOSIS — I4819 Other persistent atrial fibrillation: Secondary | ICD-10-CM

## 2017-02-14 DIAGNOSIS — Z950 Presence of cardiac pacemaker: Secondary | ICD-10-CM | POA: Diagnosis not present

## 2017-02-14 DIAGNOSIS — I495 Sick sinus syndrome: Secondary | ICD-10-CM

## 2017-02-14 DIAGNOSIS — R55 Syncope and collapse: Secondary | ICD-10-CM | POA: Diagnosis not present

## 2017-02-14 NOTE — Progress Notes (Signed)
PCP: Josetta Huddle, MD Primary Cardiologist:  Dr Stanford Breed Primary EP:  Dr Rayann Heman  [name] is a 71 y.o. female who presents today for routine electrophysiology followup.  Since last being seen in our clinic, the patient reports doing reasonably well.  She has been struggling with platelet issues for most of the summer.  She states that several weeks ago she had presyncope.  She attributes this to "a new platelet" medicine that she was on at that time.   She has stopped the medicine and has had no further events.  She was evaluated by Dr Stanford Breed and is currently wearing an event monitor.  Echo is also pending.   Today, she denies symptoms of palpitations, chest pain, shortness of breath,  lower extremity edema, or syncope.  The patient is otherwise without complaint today.   Past Medical History:  Diagnosis Date  . Acute ITP ([place]) 10/09/2016  . Anemia   . Antiphospholipid antibody syndrome ([place])   . Arthritis    "may have some" (03/28/2012)  . Atrial flutter ([place])    atypical right atrial flutter s/p ablation at [facility] by Dr Clyda Hurdle  . Cerebrovascular accident [facility]) ?1990; 1993; 2007   residual "can't use my right hand; left visual field cut" (03/28/2012)  . CHF (congestive heart failure) ([place])   . 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 Chronic kidney disease    "as a result of my heart failing" (03/28/2012)  . Fine motor impairment    "fingers right hand" (03/28/2012)  . Heart murmur   . Heparin-induced thrombocytopenia ([place])   . History of blood transfusion    "lots; most in 2007 w/MVR OR"  . History of Hashimoto thyroiditis   . HLD (hyperlipidemia)   . HTN (hypertension)   . Hypothyroidism   . 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 Pacemaker   . Rheumatic heart disease 1995   post percutaneous valvuloplasty at [facility] with subsequent mechanical mitral valve replacement and tricuspid annuloplasty at [facility]  . Right  leg swelling 10/06/2013   Asymmetric; no calf pain, venous doppler negative for DVT 10/06/13  . Subdural hematoma ([place]) 2009   on coumadin   Past Surgical History:  Procedure Laterality Date  . AMPUTATION     left atrial appendage amputated with MVR  . ATRIAL ABLATION SURGERY     CTI and R atriostomy scar flutter ablation at [facility] 8/12,  repeat  ablation at [facility]  9/12  . BI-VENTRICULAR PACEMAKER INSERTION N/A 03/28/2012   Procedure: BI-VENTRICULAR PACEMAKER INSERTION (CRT-P);  Surgeon: Evans Lance, MD;  Location: [facility] CATH LAB;  Service: Cardiovascular;  Laterality: N/A;  . biventricular pacemaker placement  03/28/2012   SJM Anthem implanted by Dr Lovena Le  . CARDIAC ELECTROPHYSIOLOGY [place] AND ABLATION  2012   "2 @ [facility]" (03/28/2012  . CARDIAC VALVE REPLACEMENT    . CARDIOVERSION  2010-2011   "3 at [facility]" (03/28/2012)  . CHOLECYSTECTOMY    . CRANIOTOMY  2008   for SDH  . MITRAL VALVE REPLACEMENT  2007   with tricuspid annuloplasty  . [place] with subsequent mechanical mitral valve replacement and tricuspid annuloplasty at [facility]  . TONSILLECTOMY    . TONSILLECTOMY     "when I was a child"  . TOTAL ABDOMINAL HYSTERECTOMY  ?1995   TAH; BSO    ROS- all systems are reviewed and negative except as per HPI  above  Current Outpatient Prescriptions  Medication Sig Dispense Refill  . acetaminophen (TYLENOL) 650 MG CR tablet Take 1,300 mg by mouth 3 (three) times daily as needed for pain.     Marland Kitchen AMOXICILLIN PO Take 4 capsules by mouth as directed. ONLY WHEN VISITING DENTIST    . Calcium Citrate (CITRACAL PO) Take 1 tablet by mouth daily.    . Cholecalciferol (VITAMIN D) 2000 UNITS tablet Take 2,000 Units by mouth daily.      . Coenzyme Q-10 100 MG capsule Take 100 mg by mouth daily.    . Estradiol Acetate (FEMRING) 0.05 MG/24HR RING Place 1 each vaginally as directed.     Marland Kitchen FLUTICASONE PROPIONATE, NASAL, NA Place 2 sprays into both  nostrils daily.    . fondaparinux (ARIXTRA) 2.5 MG/0.5ML SOLN injection INJECT 0.5ML INTO THE SKIN EVERY OTHER DAY 9.5 mL 11  . furosemide (LASIX) 20 MG tablet Take 1 tablet (20 mg total) by mouth daily. 90 tablet 1  . levothyroxine (SYNTHROID, LEVOTHROID) 88 MCG tablet Take 88 mcg by mouth daily before breakfast.    . metoprolol succinate (TOPROL-XL) 50 MG 24 hr tablet Take 1 tablet (50 mg total) by mouth 2 (two) times daily. Take with or immediately following a meal. 180 tablet 3  . Misc Natural Products (TART CHERRY ADVANCED PO) Take 2 tablets by mouth daily.    . Omega-3 Fatty Acids (OMEGA-3 FISH OIL) 500 MG CAPS Take 1 capsule by mouth daily.    . valACYclovir (VALTREX) 1000 MG tablet Take 1,000 mg by mouth 2 (two) times daily. For a total of 3 doses for labial herpes prn only     No current facility-administered medications for this visit.     Physical Exam: Vitals:   02/14/17 1413  BP: 124/62  Pulse: 86  SpO2: 97%  Weight: 161 lb (73 kg)  Height: 5\' 3"  (1.6 m)    GEN- The patient is well appearing, alert and oriented x 3 today.   Head- normocephalic, atraumatic Eyes-  Sclera clear, conjunctiva pink Ears- hearing intact Oropharynx- clear Lungs- Clear to ausculation bilaterally, normal work of breathing Chest- pacemaker pocket is well healed Heart- Regular rate and rhythm (paced), mechanical S1 GI- soft, NT, ND, + BS Extremities- no clubbing, cyanosis, or edema  Pacemaker interrogation- reviewed in detail today,  See PACEART report   Assessment and Plan:  1. Symptomatic bradycardia  Normal pacemaker function See Pace Art report No changes today  2. Recent presyncope Event monitor and echo are pending Normal pacemaker function Her pacemaker does not have any high V rate episodes to correspond with her symptoms. No further EP workup planned  3. Persistent afib On coumadin Rate  Controlled  4. Valvular heart disease Stable No change required today Echo  pending  [place] I will see again in a year Follow-up with Dr Stanford Breed as scheduled  Thompson Grayer MD, [facility] 02/14/2017 2:21 PM

## 2017-02-14 NOTE — Patient Instructions (Signed)
Medication Instructions:  Your physician recommends that you continue on your current medications as directed. Please refer to the Current Medication list given to you today.   Labwork: None ordered   Testing/Procedures: None ordered   Follow-Up: Remote monitoring is used to monitor your Pacemaker  from home. This monitoring reduces the number of office visits required to check your device to one time per year. It allows [place] to keep an eye on the functioning of your device to ensure it is working properly. You are scheduled for a device check from home on 05/14/17. You may send your transmission at any time that day. If you have a wireless device, the transmission will be sent automatically. After your physician reviews your transmission, you will receive a postcard with your next transmission date.  Your physician wants you to follow-up in: 12 months with Dr Vallery Ridge will receive a reminder letter in the mail two months in advance. If you don't receive a letter, please call our office to schedule the follow-up appointment.    Thank you for choosing [facility]!!     Janan Halter, RN [phone]

## 2017-02-15 ENCOUNTER — Telehealth: Payer: Self-pay | Admitting: Cardiology

## 2017-02-15 ENCOUNTER — Encounter (HOSPITAL_COMMUNITY): Payer: Medicare Other

## 2017-02-15 LAB — CUP PACEART REMOTE DEVICE CHECK
Battery Remaining Percentage: 65 %
Battery Voltage: 2.9 V
Date Time Interrogation Session: 20180820064508
Implantable Lead Implant Date: 20131003
Implantable Lead Implant Date: 20131003
Implantable Lead Implant Date: 20131003
Implantable Lead Location: 753858
Implantable Lead Location: 753859
Lead Channel Impedance Value: 400 Ohm
Lead Channel Impedance Value: 960 Ohm
Lead Channel Pacing Threshold Amplitude: 1.125 V
Lead Channel Setting Pacing Pulse Width: 0.5 ms
MDC IDC LEAD LOCATION: 753860
MDC IDC MSMT BATTERY REMAINING LONGEVITY: 63 mo
MDC IDC MSMT LEADCHNL LV PACING THRESHOLD PULSEWIDTH: 0.8 ms
MDC IDC MSMT LEADCHNL RV PACING THRESHOLD AMPLITUDE: 0.75 V
MDC IDC MSMT LEADCHNL RV PACING THRESHOLD PULSEWIDTH: 0.5 ms
MDC IDC MSMT LEADCHNL RV SENSING INTR AMPL: 8.1 mV
MDC IDC PG IMPLANT DT: 20131003
MDC IDC SET LEADCHNL LV PACING AMPLITUDE: 2.125
MDC IDC SET LEADCHNL LV PACING PULSEWIDTH: 0.8 ms
MDC IDC SET LEADCHNL RV PACING AMPLITUDE: 2 V
MDC IDC SET LEADCHNL RV SENSING SENSITIVITY: 2 mV
Pulse Gen Model: 3210
Pulse Gen Serial Number: 2786353

## 2017-02-15 NOTE — Telephone Encounter (Signed)
Spoke with pt, when she went for her echo they wanted to give her an injection and she has had bad reactions to medications in the past and declined the injection but she wants dr Stanford Breed to know if she needs to do that she will go back. Will await dr Jacalyn Lefevre recommendations based on echo results.

## 2017-02-15 NOTE — Telephone Encounter (Signed)
Please call,concerning the injection they wanted her to have yesterday when she was having her Echo.

## 2017-02-19 ENCOUNTER — Telehealth: Payer: Self-pay | Admitting: Cardiology

## 2017-02-19 NOTE — Telephone Encounter (Signed)
New message      Talk to the nurse regarding an appt with the advanced cardiac unit at [facility]

## 2017-02-20 ENCOUNTER — Encounter: Payer: Self-pay | Admitting: Cardiology

## 2017-02-20 LAB — CUP PACEART INCLINIC DEVICE CHECK
Battery Remaining Longevity: 60 mo
Battery Voltage: 2.9 V
Brady Statistic RV Percent Paced: 97 %
Implantable Lead Implant Date: 20131003
Implantable Lead Implant Date: 20131003
Implantable Lead Location: 753858
Implantable Lead Location: 753859
Implantable Lead Location: 753860
Lead Channel Pacing Threshold Amplitude: 0.75 V
Lead Channel Pacing Threshold Amplitude: 1 V
Lead Channel Pacing Threshold Pulse Width: 0.5 ms
Lead Channel Pacing Threshold Pulse Width: 0.8 ms
Lead Channel Sensing Intrinsic Amplitude: 8.4 mV
Lead Channel Setting Pacing Amplitude: 2 V
Lead Channel Setting Pacing Amplitude: 2 V
Lead Channel Setting Pacing Pulse Width: 0.5 ms
Lead Channel Setting Pacing Pulse Width: 0.8 ms
MDC IDC LEAD IMPLANT DT: 20131003
MDC IDC MSMT LEADCHNL LV IMPEDANCE VALUE: 1075 Ohm
MDC IDC MSMT LEADCHNL RV IMPEDANCE VALUE: 400 Ohm
MDC IDC PG IMPLANT DT: 20131003
MDC IDC SESS DTM: 20180822181647
MDC IDC SET LEADCHNL RV SENSING SENSITIVITY: 2 mV
MDC IDC STAT BRADY RA PERCENT PACED: 0 %
Pulse Gen Model: 3210
Pulse Gen Serial Number: 2786353

## 2017-02-20 NOTE — Telephone Encounter (Signed)
Heather,  Can you get this patient in some time in the next couple of weeks with me? Thanks.

## 2017-02-20 NOTE — Telephone Encounter (Signed)
New Message     October 1 is the first available appt at the [facility] and she does not want to wait that long, is there something else she can do.

## 2017-02-20 NOTE — Telephone Encounter (Signed)
Would route to Dr Aundra Dubin who has seen her previously and see if he can work her in [place]

## 2017-02-21 NOTE — Telephone Encounter (Signed)
appt rescheduled to 9/10 w/Dr Aundra Dubin

## 2017-02-22 ENCOUNTER — Encounter (HOSPITAL_COMMUNITY): Payer: Medicare Other

## 2017-02-23 ENCOUNTER — Encounter: Payer: Self-pay | Admitting: Cardiology

## 2017-02-28 ENCOUNTER — Encounter: Payer: Self-pay | Admitting: Internal Medicine

## 2017-02-28 ENCOUNTER — Ambulatory Visit (INDEPENDENT_AMBULATORY_CARE_PROVIDER_SITE_OTHER): Payer: Self-pay | Admitting: Internal Medicine

## 2017-02-28 ENCOUNTER — Other Ambulatory Visit: Payer: Self-pay | Admitting: Cardiology

## 2017-02-28 VITALS — BP 128/78 | HR 66 | Ht 63.0 in | Wt 162.8 lb

## 2017-02-28 DIAGNOSIS — Z950 Presence of cardiac pacemaker: Secondary | ICD-10-CM

## 2017-03-04 NOTE — Progress Notes (Signed)
    PCP: Josetta Huddle, MD Primary Cardiologist:  Dr Stanford Breed Primary EP:  Dr Rayann Heman  [name] is a 71 y.o. female who presents today for electrophysiology discussion regarding possible ICD upgrade. I just saw her in the office 02/14/17 (refer to my clinic note). At that time her echo was pending.  Echo subsequently revealed EF 10-15% with diffuse HK.  Mild AI, moderate LA enlargement, moderate RV dysfunction.   We discussed pros and cons to CRT-P upgrade to CRT-D.  She is aware that there is very little data to suggest benefit of this upgrade in nonischemics.  She is also very aware of her risks given thrombocytopenia and required anticoagulation.  She is very clear that she is not interested in ICD upgrade at this time.  Having known her for several years and her life preferences, I think that this is very reasonable.  She has follow-up with Dr Stanford Breed to look into the etiology of her recent EF decline and also for medicine titration.  Unfortunately, given her renal failure, medicine options may be limited.    I will have her scheduled to see EP NP for resynchronization optimization according to our protocol at the next available time.  She will keep scheduled follow-up with me in a year and I will follow remotely in the interim.  If she has ventricular arrhythmias detected or symptoms thereof, then we could reconsider our plan.   Her husband was with her today for our conversation and is in agreement with her wishes for a conservative strategy.  Thompson Grayer MD, [facility]

## 2017-03-05 ENCOUNTER — Ambulatory Visit (HOSPITAL_COMMUNITY)
Admission: RE | Admit: 2017-03-05 | Discharge: 2017-03-05 | Disposition: A | Discharge status: Home / Self Care | Payer: Medicare Other | Source: Ambulatory Visit | Attending: Cardiology | Admitting: Cardiology

## 2017-03-05 ENCOUNTER — Encounter (HOSPITAL_COMMUNITY): Payer: Self-pay | Admitting: Cardiology

## 2017-03-05 ENCOUNTER — Other Ambulatory Visit: Payer: Self-pay

## 2017-03-05 ENCOUNTER — Telehealth (HOSPITAL_COMMUNITY): Payer: Self-pay | Admitting: *Deleted

## 2017-03-05 ENCOUNTER — Telehealth (HOSPITAL_COMMUNITY): Payer: Self-pay | Admitting: Cardiology

## 2017-03-05 VITALS — BP 130/86 | HR 82 | Wt 159.0 lb

## 2017-03-05 DIAGNOSIS — Z79899 Other long term (current) drug therapy: Secondary | ICD-10-CM | POA: Insufficient documentation

## 2017-03-05 DIAGNOSIS — I5042 Chronic combined systolic (congestive) and diastolic (congestive) heart failure: Secondary | ICD-10-CM | POA: Diagnosis not present

## 2017-03-05 DIAGNOSIS — N183 Chronic kidney disease, stage 3 (moderate): Secondary | ICD-10-CM | POA: Diagnosis not present

## 2017-03-05 DIAGNOSIS — I428 Other cardiomyopathies: Secondary | ICD-10-CM | POA: Diagnosis not present

## 2017-03-05 DIAGNOSIS — I5022 Chronic systolic (congestive) heart failure: Secondary | ICD-10-CM | POA: Insufficient documentation

## 2017-03-05 DIAGNOSIS — I099 Rheumatic heart disease, unspecified: Secondary | ICD-10-CM | POA: Insufficient documentation

## 2017-03-05 DIAGNOSIS — I481 Persistent atrial fibrillation: Secondary | ICD-10-CM

## 2017-03-05 DIAGNOSIS — Z8673 Personal history of transient ischemic attack (TIA), and cerebral infarction without residual deficits: Secondary | ICD-10-CM | POA: Insufficient documentation

## 2017-03-05 DIAGNOSIS — I482 Chronic atrial fibrillation: Secondary | ICD-10-CM | POA: Diagnosis not present

## 2017-03-05 DIAGNOSIS — Z7901 Long term (current) use of anticoagulants: Secondary | ICD-10-CM | POA: Insufficient documentation

## 2017-03-05 DIAGNOSIS — I4819 Other persistent atrial fibrillation: Secondary | ICD-10-CM

## 2017-03-05 DIAGNOSIS — Z87891 Personal history of nicotine dependence: Secondary | ICD-10-CM | POA: Insufficient documentation

## 2017-03-05 DIAGNOSIS — Z9889 Other specified postprocedural states: Secondary | ICD-10-CM | POA: Diagnosis not present

## 2017-03-05 DIAGNOSIS — D693 Immune thrombocytopenic purpura: Secondary | ICD-10-CM | POA: Insufficient documentation

## 2017-03-05 LAB — BASIC METABOLIC PANEL
Anion gap: 10 (ref 5–15)
BUN: 59 mg/dL — AB (ref 6–20)
CHLORIDE: 104 mmol/L (ref 101–111)
CO2: 25 mmol/L (ref 22–32)
CREATININE: 2.9 mg/dL — AB (ref 0.44–1.00)
Calcium: 9 mg/dL (ref 8.9–10.3)
GFR calc Af Amer: 18 mL/min — ABNORMAL LOW (ref >60)
GFR calc non Af Amer: 15 mL/min — ABNORMAL LOW (ref >60)
Glucose, Bld: 95 mg/dL (ref 65–99)
Potassium: 4.3 mmol/L (ref 3.5–5.1)
SODIUM: 139 mmol/L (ref 135–145)

## 2017-03-05 MED ORDER — ISOSORBIDE MONONITRATE ER 30 MG PO TB24: 30.0000 mg | ORAL_TABLET | Freq: Every day | ORAL | 3 refills | Status: AC

## 2017-03-05 MED ORDER — HYDRALAZINE HCL 25 MG PO TABS: 12.5000 mg | ORAL_TABLET | Freq: Three times a day (TID) | ORAL | 3 refills | Status: AC

## 2017-03-05 MED ORDER — FUROSEMIDE 20 MG PO TABS: ORAL_TABLET | ORAL | 3 refills | Status: DC

## 2017-03-05 NOTE — Progress Notes (Addendum)
Patient ID: [name], female   DOB: 1945/07/24, 71 y.o.   MRN: [id number]     Advanced Heart Failure Clinic Note   PCP: Dr. Inda Merlin Primary Cardiologist: Dr. Stanford Breed Primary HF: Dr. Aundra Dubin  Hematologist: Dr. Ruben Gottron is a 71 y.o. female extensive past history including mechanical MV and TV repair, chronic systolic CHF with EF 63-87%, chronic atrial fibrillation, prior CVA, antiphospholipid antibody syndrome, subdural hematomas x 2, and CKD presents for CHF clinic evaluation.  Patient had initial valvuloplasty at [facility] in 1995.  She later had mechanical MV replacement and TV repair complicated by CVA with left hemiparesis.  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  She is anticoagulated now with fondaparinux as she had subdural hematomas x 2 while on coumadin.  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 She has permanent atrial fibrillation (has failed cardioversion, cannot get amiodarone as she had torsades in the past with amiodarone use).  She had an adenosine Cardiolite in 10/15 that showed EF 35% with fixed defect, no ischemia.  This was thought to be scar versus pacing artifact.  Last seen in HF clinic 05/2014. Referred back by Dr. Stanford Breed due to drop in EF.  Also sent to Dr. Rayann Heman to consider upgrade to CRT device.  Conservative approach decided upon with limited data in CRT devices in NICM. He plans to follow up with her yearly.   She presents today to re-establish in the HF clinic with recent drop in EF from the 35-40% range to 10-15% range on most recent echo in 8/18.   She was doing well until around 4/18.  She was walking 3 miles/day without problems. In 4/18, she was started on prednisone for ITP and later romiplostim.  She developed profound exhaustion/fatigue to the point where romiplostim was stopped.  Since stopping this medication (now taking Promacta to stimulate platelet production), she has felt better. Currently, she rows for exercise for 30  minutes/day. No dyspnea walking on flat ground.  She will fatigue after walking about 1 mile.  She is fatigued after climbing 1 flight of steps.  Weight has been stable.  No chest pain, no orthopnea/PND.  While on romiplostim, she developed presyncopal spells.  Device interrogation and event monitor did not show an arrhythmic cause. Since stopping romiplostim, no further presyncopal events.   Labs (8/18): hgb 9.6, plts 45K, K 4.8, creatinine 1.99  PMH: 1. Rheumatic heart disease: Mitral valvuloplasty 1995 at [facility].  Later had mechanical MV replacement and tricuspid annuloplasty at [facility].   2. CVA: Embolic, at time of mitral valve surgery.  Had left hemiparesis.  3. H/o HIT 4. 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  5. Hypothyroidism: Hashimoto's thyroiditis.  6. Atypical atrial flutter: s/p ablation in 8/12 and repeat in 9/12 [facility]).  7. Chronic atrial fibrillation 8. Torsades de pointes with amiodarone use.  9. Antiphospholipid antibody syndrome: Anticoagulated with fondaparinus.  10. CKD stage 3 11. Chronic systolic CHF: [employer] CRT-D device.  Echo (4/15) with EF 45-50%.  Echo (6/15) with EF 35-40%, diffuse hypokinesis, normal mechanical mitral valve with mild MR, tricuspid valve s/p repair.  - Adenosine Cardiolite (10/15) with EF 35%, fixed apical anterior, apical septal, and apical defect with no ischemia.  Scar versus pacing artifact.  - Echo 02/14/17 LVEF 10-15%, Mild AI, Mod LAE, Mod RV dilation, RV systolic function moderately reduced, Mod TR, PA peak pressure 54 mm Hg.  12. ITP:  Followed by Dr. Beryle Beams.   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   FH: No premature CAD  Review of systems complete and found to be negative unless listed in HPI.    Current Outpatient Prescriptions  Medication Sig Dispense Refill  . acetaminophen (TYLENOL) 650 MG CR tablet Take 1,300 mg by mouth 3 (three) times daily as needed for pain.      Marland Kitchen AMOXICILLIN PO Take 4 capsules by mouth as directed. ONLY WHEN VISITING DENTIST    . Calcium Citrate (CITRACAL PO) Take 1 tablet by mouth daily.    . Cholecalciferol (VITAMIN D) 2000 UNITS tablet Take 2,000 Units by mouth daily.      . Coenzyme Q-10 100 MG capsule Take 100 mg by mouth daily.    Marland Kitchen eltrombopag (PROMACTA) 50 MG tablet Take 50 mg by mouth daily. Take on an empty stomach 1 hour before a meal or 2 hours after    . Estradiol Acetate (FEMRING) 0.05 MG/24HR RING Place 1 each vaginally as directed.     Marland Kitchen FLUTICASONE PROPIONATE, NASAL, NA Place 2 sprays into both nostrils daily.    . fondaparinux (ARIXTRA) 2.5 MG/0.5ML SOLN injection INJECT 0.5ML INTO THE SKIN EVERY OTHER DAY 9.5 mL 11  . furosemide (LASIX) 20 MG tablet TAKE ONE (1) TABLET BY MOUTH EVERY DAY 90 tablet 3  . levothyroxine (SYNTHROID, LEVOTHROID) 88 MCG tablet Take 88 mcg by mouth daily before breakfast.    . metoprolol succinate (TOPROL-XL) 50 MG 24 hr tablet TAKE ONE (1) TABLET BY MOUTH TWO (2) TIMES DAILY TAKE WITH OR IMMEDIATELY FOLLOWING A MEAL 180 tablet 3  . Misc Natural Products (TART CHERRY ADVANCED PO) Take 2 tablets by mouth daily.    . Omega-3 Fatty Acids (OMEGA-3 FISH OIL) 500 MG CAPS Take 1 capsule by mouth daily.    . valACYclovir (VALTREX) 1000 MG tablet Take 1,000 mg by mouth 2 (two) times daily. For a total of 3 doses for labial herpes prn only     No current facility-administered medications for this encounter.    Vitals:   03/05/17 0946  BP: 130/86  Pulse: 82  SpO2: 97%  Weight: 159 lb (72.1 kg)   Wt Readings from Last 3 Encounters:  03/05/17 159 lb (72.1 kg)  02/28/17 162 lb 12.8 oz (73.8 kg)  02/14/17 161 lb (73 kg)     General: NAD Neck: JVP 9-10 cm, no thyromegaly or thyroid nodule.  Lungs: Clear to auscultation bilaterally with normal respiratory effort. CV: Nondisplaced PMI.  Heart regular S1/S2, mechanical S1, no S3/S4, 1/6 SEM RUSB.  No edema.  No carotid bruit.  Normal pedal  pulses.  Abdomen: Soft, nontender, no hepatosplenomegaly, no distention.  Skin: Intact without lesions or rashes.  Neurologic: Alert and oriented x 3.  Psych: Normal affect. Extremities: No clubbing or cyanosis.   Assessment/Plan:  1. Rheumatic heart disease: S/p MV replacement (mechanical) and TV repair.  Echo in 8/18 showed normal mechanical mitral valve.  There was moderate TR.  She is anticoagulated with fondaparinux chronically given subdural hematomas x 2 on coumadin.  She is not on aspirin given low platelets.   2. Atrial fibrillation: Permanent.  Patient has failed DCCV and amiodarone caused torsades.  PCM check recently showed 96% BiV pacing. As above, she is on fondaprinux.  Rate control is adequate on Toprol XL.  3. CKD:  Stage 3.  Most recent BMET with creatinine 1.99 (somewhat improved).  - BMET today.  4. Chronic systolic  CHF: Suspected nonischemic cardiomyopathy.  EF 35-40% on 6/15 echo, decreased to 10-15% with moderately decreased RV systolic function on 9/40 echo.  She has a Physiological scientist.  On exam today, she is mildly volume overloaded.  Symptoms are NYHA class II, somewhat improved from earlier in the year when she was taking romiplostim.   - Increase Lasix to 40 daily alternating with 20 mg daily.  BMET today and repeat in 10 days.  - Continue current Toprol XL.  - She is not on ACEI/ARB/ARNI/spironolactone due to CKD with creatinine frequently > 2. If K and creatinine remain stable, will add spironolactone next appointment.  - I will start hydralazine 12.5 mg tid + Imdur 30 mg daily.  - I will arrange for Lexiscan Cardiolite given fall in EF.   Would avoid cath if possible with CKD, ITP, h/o SDH, mechanical valve, and APLAS (only if there is a large area of ischemia).   - She had a discussion with Dr. Rayann Heman and decided against defibrillator (has CRT-P device).  5. APLAS: Stable.  6. ITP: Platelets 45K in 8/18.  She is on Promacta now.   7. H/o CVA: Mild residual  left-sided weakness.   Loralie Champagne 03/05/2017

## 2017-03-05 NOTE — Telephone Encounter (Signed)
Patient given detailed instructions per Myocardial Perfusion Study Information Sheet for the test on 03/06/17 at 0745. Patient notified to arrive 15 minutes early and that it is imperative to arrive on time for appointment to keep from having the test rescheduled.  If you need to cancel or reschedule your appointment, please call the office within 24 hours of your appointment. . Patient verbalized understanding.Akbar Sacra, Ranae Palms

## 2017-03-05 NOTE — Addendum Note (Signed)
Encounter addended by: Larey Dresser, MD on: 03/05/2017 11:57 PM<BR>    Actions taken: Sign clinical note

## 2017-03-05 NOTE — Patient Instructions (Signed)
Increase Furosemide to 40 mg daily every other day ALTERNATING with 20 mg daily every other day   Start Hydralazine 12.5 mg (1/2 tab) Three times a day   Start Imdur 30 mg daily  Labs today  Labs in 10-14 days  Your physician has requested that you have a lexiscan myoview. For further information please visit [email or web address]. Please follow instruction sheet, as given.  Your physician recommends that you schedule a follow-up appointment in: 1 month

## 2017-03-06 ENCOUNTER — Other Ambulatory Visit: Payer: Medicare Other

## 2017-03-06 ENCOUNTER — Telehealth (HOSPITAL_COMMUNITY): Payer: Self-pay | Admitting: *Deleted

## 2017-03-06 ENCOUNTER — Ambulatory Visit (HOSPITAL_COMMUNITY): Payer: Medicare Other | Attending: Cardiology

## 2017-03-06 DIAGNOSIS — I5042 Chronic combined systolic (congestive) and diastolic (congestive) heart failure: Secondary | ICD-10-CM | POA: Diagnosis not present

## 2017-03-06 DIAGNOSIS — R9439 Abnormal result of other cardiovascular function study: Secondary | ICD-10-CM | POA: Diagnosis not present

## 2017-03-06 DIAGNOSIS — I5022 Chronic systolic (congestive) heart failure: Secondary | ICD-10-CM

## 2017-03-06 LAB — MYOCARDIAL PERFUSION IMAGING
CHL CUP NUCLEAR SDS: 0
CHL CUP RESTING HR STRESS: 80 {beats}/min
CSEPPHR: 85 {beats}/min
LV sys vol: 63 mL
LVDIAVOL: 110 mL (ref 46–106)
RATE: 0.47
SRS: 8
SSS: 8
TID: 1.01

## 2017-03-06 MED ORDER — REGADENOSON 0.4 MG/5ML IV SOLN
0.4000 mg | Freq: Once | INTRAVENOUS | Status: AC
  Administered 2017-03-06: 0.4 mg via INTRAVENOUS

## 2017-03-06 MED ORDER — TECHNETIUM TC 99M TETROFOSMIN IV KIT
32.6000 | PACK | Freq: Once | INTRAVENOUS | Status: AC | PRN
  Administered 2017-03-06: 32.6 via INTRAVENOUS
  Filled 2017-03-06: qty 33

## 2017-03-06 MED ORDER — TECHNETIUM TC 99M TETROFOSMIN IV KIT
10.5000 | PACK | Freq: Once | INTRAVENOUS | Status: AC | PRN
  Administered 2017-03-06: 10.5 via INTRAVENOUS
  Filled 2017-03-06: qty 11

## 2017-03-06 NOTE — Telephone Encounter (Signed)
-----   Message from Larey Dresser, MD sent at 03/06/2017  7:55 AM EDT ----- Creatinine a bit higher.  Make sure she gets a repeat BMET in 7 days.

## 2017-03-07 ENCOUNTER — Telehealth (HOSPITAL_COMMUNITY): Payer: Self-pay

## 2017-03-07 NOTE — Telephone Encounter (Signed)
Notes recorded by Shirley Muscat, RN on 03/07/2017 at 1:16 PM EDT Pt aware and agreeable   ------  Notes recorded by Larey Dresser, MD on 03/06/2017 at 4:42 PM EDT EF 42% (looks better than echo). Fixed defect similar to prior, may be pacing artifact. No ischemia. I do not think she needs a cath.

## 2017-03-09 NOTE — Telephone Encounter (Signed)
User: Cherie Dark A Date/time: 03/05/17 10:50 AM  Comment: Called pt and lmsg for her to CB to sch myoview and labs.  Context:  Outcome: Left Message  Phone number: [phone] Phone Type: Home Phone  Comm. type: Telephone Call type: Outgoing  Contact: Nunzio Cobbs A Relation to patient: Self

## 2017-03-12 ENCOUNTER — Ambulatory Visit (HOSPITAL_COMMUNITY)
Admission: RE | Admit: 2017-03-12 | Discharge: 2017-03-12 | Disposition: A | Discharge status: Home / Self Care | Payer: Medicare Other | Source: Ambulatory Visit | Attending: Cardiology | Admitting: Cardiology

## 2017-03-12 ENCOUNTER — Other Ambulatory Visit (HOSPITAL_COMMUNITY): Payer: Medicare Other

## 2017-03-12 DIAGNOSIS — I5022 Chronic systolic (congestive) heart failure: Secondary | ICD-10-CM | POA: Insufficient documentation

## 2017-03-12 LAB — BASIC METABOLIC PANEL
Anion gap: 10 (ref 5–15)
BUN: 68 mg/dL — AB (ref 6–20)
CALCIUM: 9.3 mg/dL (ref 8.9–10.3)
CO2: 22 mmol/L (ref 22–32)
CREATININE: 2.98 mg/dL — AB (ref 0.44–1.00)
Chloride: 103 mmol/L (ref 101–111)
GFR calc Af Amer: 17 mL/min — ABNORMAL LOW (ref >60)
GFR, EST NON AFRICAN AMERICAN: 15 mL/min — AB (ref >60)
GLUCOSE: 115 mg/dL — AB (ref 65–99)
Potassium: 4.1 mmol/L (ref 3.5–5.1)
SODIUM: 135 mmol/L (ref 135–145)

## 2017-03-13 ENCOUNTER — Ambulatory Visit (INDEPENDENT_AMBULATORY_CARE_PROVIDER_SITE_OTHER): Payer: Medicare Other | Admitting: Oncology

## 2017-03-13 ENCOUNTER — Encounter: Payer: Medicare Other | Admitting: Oncology

## 2017-03-13 ENCOUNTER — Telehealth (HOSPITAL_COMMUNITY): Payer: Self-pay

## 2017-03-13 ENCOUNTER — Encounter: Payer: Self-pay | Admitting: Oncology

## 2017-03-13 VITALS — BP 124/70 | HR 84 | Temp 98.0°F | Ht 63.0 in | Wt 158.4 lb

## 2017-03-13 DIAGNOSIS — N184 Chronic kidney disease, stage 4 (severe): Secondary | ICD-10-CM | POA: Diagnosis not present

## 2017-03-13 DIAGNOSIS — D693 Immune thrombocytopenic purpura: Secondary | ICD-10-CM | POA: Diagnosis present

## 2017-03-13 DIAGNOSIS — D6861 Antiphospholipid syndrome: Secondary | ICD-10-CM | POA: Diagnosis not present

## 2017-03-13 LAB — CBC WITH DIFFERENTIAL/PLATELET
BASOS ABS: 0.1 10*3/uL (ref 0.0–0.1)
BASOS PCT: 1 %
EOS ABS: 0.4 10*3/uL (ref 0.0–0.7)
EOS PCT: 9 %
HCT: 29.3 % — ABNORMAL LOW (ref 36.0–46.0)
Hemoglobin: 9.2 g/dL — ABNORMAL LOW (ref 12.0–15.0)
LYMPHS PCT: 19 %
Lymphs Abs: 1 10*3/uL (ref 0.7–4.0)
MCH: 27.8 pg (ref 26.0–34.0)
MCHC: 31.4 g/dL (ref 30.0–36.0)
MCV: 88.5 fL (ref 78.0–100.0)
MONO ABS: 0.3 10*3/uL (ref 0.1–1.0)
Monocytes Relative: 5 %
Neutro Abs: 3.4 10*3/uL (ref 1.7–7.7)
Neutrophils Relative %: 66 %
PLATELETS: 23 10*3/uL — AB (ref 150–400)
RBC: 3.31 MIL/uL — ABNORMAL LOW (ref 3.87–5.11)
RDW: 14.7 % (ref 11.5–15.5)
WBC: 5.1 10*3/uL (ref 4.0–10.5)

## 2017-03-13 LAB — HEPARIN ANTI-XA: HEPARIN LMW: 0.97 [IU]/mL

## 2017-03-13 MED ORDER — FUROSEMIDE 20 MG PO TABS
20.0000 mg | ORAL_TABLET | Freq: Every day | ORAL | 3 refills | Status: DC
Start: 2017-03-13 — End: 2017-04-12

## 2017-03-13 NOTE — Telephone Encounter (Signed)
Notes recorded by Shirley Muscat, RN on 03/13/2017 at 9:20 AM EDT Pt aware of results and agreeable to med changes. Med changes made in [facility]  ------  Notes recorded by Larey Dresser, MD on 03/13/2017 at 9:04 AM EDT Creatinine mildly higher. Would probably have her go back to Lasix 20 mg daily for now rather than alternating with 40. Take Lasix 40 mg daily x 2 days in a row if weight rises 2 lbs overnight or 3 lbs in a week.

## 2017-03-13 NOTE — Progress Notes (Signed)
Hematology and Oncology Follow Up Visit  [name] [id number] Apr 29, 1946 71 y.o. 03/13/2017 4:16 PM   Principle Diagnosis: Encounter Diagnoses  Name Primary?  . Acute ITP ([place]) Yes  . Chronic ITP (idiopathic thrombocytopenia) ([facility])   . 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  She had long-standing mild thrombocytopenia related to underlying antiphospholipid antibody syndrome.  Over the last year, platelet count has fallen to unacceptably low levels.  She had a minimal brief response to steroids lasting only a few weeks.  She was started on a trial of Romiplostim.  She received 3 weekly injections beginning on January 04, 2017.  She developed profound fatigue which is unusual for this medication.  No platelet response.  Platelets got into a critical range down to 19,000 by July 31.  She was given a 2 day treatment with IVIG.  She had a rapid rise in her platelet count within 24 hours up to 131,000.  Platelet count predictably drifted down and was 45,000 by August 20.  She was started on a trial of Promacta 50 mg p.o. daily which is another thrombopoietin receptor agonist.  She has been on the drug for 3 weeks.  We have not seen a platelet response at this dose and count today is 23,000.  Situation complicated further by the fact that she is on chronic full dose anticoagulation status post mechanical mitral valve replacement.  Addition, she has had slowly progressive renal function and creatinine is now up to 3. Low molecular weight heparin level today in acceptable range with a peak 6 hours after most recent dose of 0.97.  She is receiving fondaparinux.  This is cleared by the kidney so we will need ongoing close monitoring.  Given her underlying cardiac situation, in view of the profound fatigue not clearly related to the new medication, she was reevaluated by her  cardiologist.  An echocardiogram done on 8/22 showed a dramatic decrease in her left ventricular systolic function with estimated ejection fraction 10-15%.  She underwent a Myoview stress test on September 11.  This was much more encouraging than the echo with an estimated ejection fraction of 30-44%.  No areas of reversible ischemia.  Cardiac catheterization not indicated.  With respect to her progressive renal insufficiency, she has been on a tight rope with diuretics.  She is followed closely by Dr. Lorrene Reid, nephrology.  Recent increase in her diuretic regimen by her cardiologist but in view of concomitant rise in BUN, some of her renal insufficiency felt secondary to the diuretics and she was advised to hold these yesterday.  Current BUN 59 up from 42 in June.  Previous BUN 55 and creatinine 2.3 and diuretics were temporarily held at that time with some improvement in the renal function with BUN 42 and creatinine 1.7 on June 6.  Medications: reviewed  Allergies:  Allergies  Allergen Reactions  . Amiodarone Hcl Other (See Comments)    "torsades; v tach"  . 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 Vitamin K Anaphylaxis    IV only allergy  . Iodinated Diagnostic Agents Itching    "over 35 years ago" (03/28/2012)    Review of Systems: See interim history Remaining ROS negative:   Physical Exam: Blood pressure 124/70, pulse 84, temperature 98 F (36.7 C), temperature source Oral, height 5\' 3"  (1.6 m), weight 158 lb 6.4 oz (71.8 kg), SpO2  100 %. Wt Readings from Last 3 Encounters:  03/13/17 158 lb 6.4 oz (71.8 kg)  03/06/17 159 lb (72.1 kg)  03/05/17 159 lb (72.1 kg)     General appearance: Chronically ill appearing Caucasian woman. HENNT: Pharynx no erythema, exudate, mass, or ulcer. No thyromegaly or thyroid nodules Lymph nodes: No cervical, supraclavicular, or axillary lymphadenopathy Breasts:  Lungs: Clear to auscultation, resonant to percussion throughout Heart: Regular rhythm,  mechanical heart valve sounds no murmur, no gallop, no rub, no click, no edema Abdomen: Soft, nontender, normal bowel sounds, no mass, no organomegaly Extremities: No edema, no calf tenderness Musculoskeletal: no joint deformities GU:  Vascular: Carotid pulses 2+, no bruits,  Neurologic: Alert, oriented, PERRLA,  cranial nerves grossly normal, motor strength 5 over 5, reflexes 1+ symmetric, upper body coordination normal, gait normal, Skin: No rash; scattered small ecchymosis skin of forearms  Lab Results: CBC W/Diff    Component Value Date/Time   WBC 5.9 02/12/2017 1040   RBC 3.41 (L) 02/12/2017 1040   HGB 9.6 (L) 02/12/2017 1040   HGB 9.6 (L) 10/03/2016 1114   HGB 10.7 (L) 08/02/2015 1315   HCT 31.1 (L) 02/12/2017 1040   HCT 28.1 (L) 10/03/2016 1114   HCT 33.2 (L) 08/02/2015 1315   PLT 45 (L) 02/12/2017 1040   PLT 22 (LL) 10/03/2016 1114   MCV 91.2 02/12/2017 1040   MCV 87 10/03/2016 1114   MCV 93.8 08/02/2015 1315   MCH 28.2 02/12/2017 1040   MCHC 30.9 02/12/2017 1040   RDW 16.0 (H) 02/12/2017 1040   RDW 13.8 10/03/2016 1114   RDW 12.7 08/02/2015 1315   LYMPHSABS 0.9 02/12/2017 1040   LYMPHSABS 1.0 10/03/2016 1114   LYMPHSABS 0.9 08/02/2015 1315   MONOABS 0.3 02/12/2017 1040   MONOABS 0.4 08/02/2015 1315   EOSABS 0.3 02/12/2017 1040   EOSABS 0.3 10/03/2016 1114   BASOSABS 0.0 02/12/2017 1040   BASOSABS 0.1 10/03/2016 1114   BASOSABS 0.1 08/02/2015 1315     Chemistry      Component Value Date/Time   NA 135 03/12/2017 1249   NA 141 11/29/2016 1049   NA 142 08/02/2015 1315   K 4.1 03/12/2017 1249   K 4.4 08/02/2015 1315   CL 103 03/12/2017 1249   CL 108 (H) 11/04/2012 1158   CO2 22 03/12/2017 1249   CO2 25 08/02/2015 1315   BUN 68 (H) 03/12/2017 1249   BUN 42 (H) 11/29/2016 1049   BUN 62.9 (H) 08/02/2015 1315   CREATININE 2.98 (H) 03/12/2017 1249   CREATININE 1.9 (H) 08/02/2015 1315      Component Value Date/Time   CALCIUM 9.3 03/12/2017 1249   CALCIUM  9.1 08/02/2015 1315   ALKPHOS 117 01/23/2017 1456   ALKPHOS 103 08/02/2015 1315   AST 25 01/23/2017 1456   AST 36 (H) 08/02/2015 1315   ALT 18 01/23/2017 1456   ALT 31 08/02/2015 1315   BILITOT 0.7 01/23/2017 1456   BILITOT 0.4 11/29/2016 1049   BILITOT 0.60 08/02/2015 1315       Radiological Studies: No results found.  Impression:  Treatment resistant ITP  I will increase dose of Promacta to 75 mg daily. This is the maximum dose allowed. Continue to monitor platelet counts weekly. If no response after 2-3 weeks, and platelet count still in dangerous zone, we know she does respond promptly to IVIG. This is not a good long-term solution since maximum effect is only about 3 weeks. We do have one other alternative prior  to considering splenectomy which is a drug that was just released fosfantinib which inhibits Fc  receptor processing of platelets by macrophages in the spleen. I'm trying to avoid splenectomy in this patient due to high surgical risks. Dr. Kathyrn Drown is becoming increasingly frustrated and depressed over her situation. Her husband is providing significant support. However, I will definitely consider starting her on an antidepressant.  For her additional medical problems list please see previous notes.   CC: Patient Care Team: Josetta Huddle, MD as PCP - General (Internal Medicine) Beryle Beams Alyson Locket, MD as Consulting Physician (Oncology) Jamal Maes, MD as Consulting Physician (Nephrology) Stanford Breed Denice Bors, MD as Consulting Physician (Cardiology) Thompson Grayer, MD as Consulting Physician (Cardiology)   Murriel Hopper, MD, [place]  Hematology-Oncology/Internal Medicine     9/18/20184:16 PM

## 2017-03-13 NOTE — Patient Instructions (Addendum)
CBC Q week start Wed 9/26 MD visit 10/16 9:45 AM

## 2017-03-14 ENCOUNTER — Other Ambulatory Visit: Payer: Self-pay | Admitting: Oncology

## 2017-03-20 NOTE — Addendum Note (Signed)
Addended by: Truddie Crumble on: 03/20/2017 03:11 PM   Modules accepted: Orders

## 2017-03-21 ENCOUNTER — Telehealth: Payer: Self-pay | Admitting: *Deleted

## 2017-03-21 ENCOUNTER — Other Ambulatory Visit: Payer: Self-pay | Admitting: Oncology

## 2017-03-21 ENCOUNTER — Other Ambulatory Visit (INDEPENDENT_AMBULATORY_CARE_PROVIDER_SITE_OTHER): Payer: Medicare Other

## 2017-03-21 DIAGNOSIS — D693 Immune thrombocytopenic purpura: Secondary | ICD-10-CM

## 2017-03-21 DIAGNOSIS — D6861 Antiphospholipid syndrome: Secondary | ICD-10-CM

## 2017-03-21 DIAGNOSIS — N184 Chronic kidney disease, stage 4 (severe): Secondary | ICD-10-CM

## 2017-03-21 LAB — CBC WITH DIFFERENTIAL/PLATELET
BASOS ABS: 0.1 10*3/uL (ref 0.0–0.1)
Basophils Relative: 1 %
EOS PCT: 5 %
Eosinophils Absolute: 0.4 10*3/uL (ref 0.0–0.7)
HCT: 28.5 % — ABNORMAL LOW (ref 36.0–46.0)
Hemoglobin: 9.1 g/dL — ABNORMAL LOW (ref 12.0–15.0)
LYMPHS PCT: 12 %
Lymphs Abs: 0.9 10*3/uL (ref 0.7–4.0)
MCH: 28.4 pg (ref 26.0–34.0)
MCHC: 31.9 g/dL (ref 30.0–36.0)
MCV: 89.1 fL (ref 78.0–100.0)
MONO ABS: 0.4 10*3/uL (ref 0.1–1.0)
MONOS PCT: 5 %
Neutro Abs: 5.6 10*3/uL (ref 1.7–7.7)
Neutrophils Relative %: 77 %
PLATELETS: 23 10*3/uL — AB (ref 150–400)
RBC: 3.2 MIL/uL — ABNORMAL LOW (ref 3.87–5.11)
RDW: 15.2 % (ref 11.5–15.5)
WBC: 7.4 10*3/uL (ref 4.0–10.5)

## 2017-03-21 LAB — COMPREHENSIVE METABOLIC PANEL
ALT: 13 U/L — ABNORMAL LOW (ref 14–54)
ANION GAP: 10 (ref 5–15)
AST: 20 U/L (ref 15–41)
Albumin: 3.3 g/dL — ABNORMAL LOW (ref 3.5–5.0)
Alkaline Phosphatase: 84 U/L (ref 38–126)
BUN: 75 mg/dL — AB (ref 6–20)
CHLORIDE: 107 mmol/L (ref 101–111)
CO2: 20 mmol/L — ABNORMAL LOW (ref 22–32)
CREATININE: 2.91 mg/dL — AB (ref 0.44–1.00)
Calcium: 9 mg/dL (ref 8.9–10.3)
GFR, EST AFRICAN AMERICAN: 18 mL/min — AB (ref >60)
GFR, EST NON AFRICAN AMERICAN: 15 mL/min — AB (ref >60)
Glucose, Bld: 94 mg/dL (ref 65–99)
POTASSIUM: 4.6 mmol/L (ref 3.5–5.1)
Sodium: 137 mmol/L (ref 135–145)
TOTAL PROTEIN: 6.7 g/dL (ref 6.5–8.1)
Total Bilirubin: <0.1 mg/dL — ABNORMAL LOW (ref 0.3–1.2)

## 2017-03-21 MED ORDER — CEPHALEXIN 500 MG PO CAPS: 500.0000 mg | ORAL_CAPSULE | Freq: Two times a day (BID) | ORAL | 1 refills | Status: AC

## 2017-03-21 NOTE — Telephone Encounter (Signed)
Call from [place], main lab - states pt's platelet count is 23. Result called to Dr Beryle Beams.

## 2017-03-23 ENCOUNTER — Other Ambulatory Visit: Payer: Self-pay | Admitting: Nurse Practitioner

## 2017-03-23 ENCOUNTER — Encounter: Payer: Self-pay | Admitting: Nurse Practitioner

## 2017-03-23 DIAGNOSIS — I5022 Chronic systolic (congestive) heart failure: Secondary | ICD-10-CM

## 2017-03-26 ENCOUNTER — Telehealth: Payer: Self-pay | Admitting: Nurse Practitioner

## 2017-03-26 ENCOUNTER — Encounter (HOSPITAL_COMMUNITY): Payer: Medicare Other | Admitting: Cardiology

## 2017-03-26 NOTE — Telephone Encounter (Signed)
New message    Pt is calling to find out why she needs to have another echo if she just had one in August. Please call.

## 2017-03-28 ENCOUNTER — Other Ambulatory Visit (INDEPENDENT_AMBULATORY_CARE_PROVIDER_SITE_OTHER): Payer: Medicare Other

## 2017-03-28 DIAGNOSIS — D693 Immune thrombocytopenic purpura: Secondary | ICD-10-CM | POA: Diagnosis present

## 2017-03-28 DIAGNOSIS — D6861 Antiphospholipid syndrome: Secondary | ICD-10-CM

## 2017-03-28 DIAGNOSIS — N184 Chronic kidney disease, stage 4 (severe): Secondary | ICD-10-CM

## 2017-03-28 LAB — CBC WITH DIFFERENTIAL/PLATELET
BASOS ABS: 0.1 10*3/uL (ref 0.0–0.1)
BASOS PCT: 1 %
EOS ABS: 0.3 10*3/uL (ref 0.0–0.7)
Eosinophils Relative: 4 %
HEMATOCRIT: 26.9 % — AB (ref 36.0–46.0)
HEMOGLOBIN: 8.5 g/dL — AB (ref 12.0–15.0)
Lymphocytes Relative: 9 %
Lymphs Abs: 0.8 10*3/uL (ref 0.7–4.0)
MCH: 27.8 pg (ref 26.0–34.0)
MCHC: 31.6 g/dL (ref 30.0–36.0)
MCV: 87.9 fL (ref 78.0–100.0)
MONOS PCT: 4 %
Monocytes Absolute: 0.4 10*3/uL (ref 0.1–1.0)
NEUTROS ABS: 7.5 10*3/uL (ref 1.7–7.7)
Neutrophils Relative %: 82 %
Platelets: 29 10*3/uL — CL (ref 150–400)
RBC: 3.06 MIL/uL — AB (ref 3.87–5.11)
RDW: 15.1 % (ref 11.5–15.5)
WBC: 9.1 10*3/uL (ref 4.0–10.5)

## 2017-03-29 NOTE — Telephone Encounter (Signed)
Dr Rayann Heman has recommended VV optimization echo to help improve cardiac function. This is not a stress test. This is an echocardiogram where we optimize device settings.   Chanetta Marshall, NP 03/29/2017 2:16 PM

## 2017-03-29 NOTE — Telephone Encounter (Signed)
Spoke to pt and Lynnell Jude was messaged for response back for pt.

## 2017-03-29 NOTE — Telephone Encounter (Signed)
Follow up  ° ° ° °Pt is calling to follow up on this.  °

## 2017-03-30 ENCOUNTER — Encounter: Payer: Self-pay | Admitting: Oncology

## 2017-03-30 ENCOUNTER — Ambulatory Visit (INDEPENDENT_AMBULATORY_CARE_PROVIDER_SITE_OTHER): Payer: Medicare Other | Admitting: Oncology

## 2017-03-30 ENCOUNTER — Telehealth: Payer: Self-pay | Admitting: *Deleted

## 2017-03-30 VITALS — BP 113/68 | HR 88 | Temp 98.0°F | Wt 159.2 lb

## 2017-03-30 DIAGNOSIS — Z5189 Encounter for other specified aftercare: Secondary | ICD-10-CM

## 2017-03-30 DIAGNOSIS — M79602 Pain in left arm: Secondary | ICD-10-CM

## 2017-03-30 DIAGNOSIS — I890 Lymphedema, not elsewhere classified: Secondary | ICD-10-CM

## 2017-03-30 DIAGNOSIS — D693 Immune thrombocytopenic purpura: Secondary | ICD-10-CM

## 2017-03-30 HISTORY — DX: Lymphedema, not elsewhere classified: I89.0

## 2017-03-30 MED ORDER — TRAMADOL HCL 50 MG PO TABS: 50.0000 mg | ORAL_TABLET | Freq: Four times a day (QID) | ORAL | 2 refills | Status: DC | PRN

## 2017-03-30 MED ORDER — ZOLPIDEM TARTRATE 5 MG PO TABS: 5.0000 mg | ORAL_TABLET | Freq: Every evening | ORAL | 1 refills | Status: AC | PRN

## 2017-03-30 MED ORDER — METHYLPREDNISOLONE 4 MG PO TBPK: ORAL_TABLET | ORAL | 1 refills | Status: DC

## 2017-03-30 NOTE — Telephone Encounter (Signed)
LMOVM TO CALL BACK CLINIC FOR REASONING OF GETTING AN ECHOCARDIOGRAM.

## 2017-03-30 NOTE — Progress Notes (Addendum)
Hematology and Oncology Follow Up Visit  [name] [id number] 12/24/1945 71 y.o. 03/30/2017 3:17 PM   Principle Diagnosis: Encounter Diagnoses  Name Primary?  . Arm pain, diffuse, left   . Lymphedema of left arm   . Encounter for wound care   . Chronic ITP (idiopathic thrombocytopenia) (HCC) Yes     Interim History: Work in visit to follow-up on a cutaneous wound that occurred subsequent to a nuclear medicine test when adhesive taste was removed from the patient's arm along with a generous amount of underlying skin.  I was concerned enough about the wound to have her see 1 of our surgeons.  I started her on a 10-day course of Keflex.  Surgeon did not find any red flag signs on his exam last week or again today.  In the interim, she has developed severe pain and swelling involving the entire left arm.  Medications: reviewed  Allergies:  Allergies  Allergen Reactions  . Amiodarone Hcl Other (See Comments)    "torsades; v tach"  . 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 Vitamin K Anaphylaxis    IV only allergy  . Iodinated Diagnostic Agents Itching    "over 35 years ago" (03/28/2012)    Review of Systems: See interim history Remaining ROS negative:   Physical Exam: Blood pressure 113/68, pulse 88, temperature 98 F (36.7 C), temperature source Oral, weight 159 lb 3.2 oz (72.2 kg), SpO2 98 %. Wt Readings from Last 3 Encounters:  03/30/17 159 lb 3.2 oz (72.2 kg)  03/13/17 158 lb 6.4 oz (71.8 kg)  03/06/17 159 lb (72.1 kg)     General appearance: Chronically ill-appearing Caucasian woman Focused exam: Left upper extremity swollen including dorsum of the hand with significant increase in tissue fluid.  Forearm is not tight.  No evidence for a compartment syndrome.  Hand is warm and well perfused.  Radial pulse is 2+.  Motor strength is 5/5.  Reflex is 2+ at the biceps. There is a large, necrotic appearing wound over about 15 x 15 cm volar surface antecubital fossa  site of previous intravenous line to inject radionuclide for a Myoview stress test.  Large eschar over the wound.  Some areas of minimal bloody drainage. She has a diffuse petechial rash from her knees down on the lower extremities.  Platelet count done a few days ago 29,000.  This is close to her recent baseline.  She continues on a trial of oral Promacta thrombopoietin receptor agonist.  Lab Results: CBC W/Diff    Component Value Date/Time   WBC 9.1 03/28/2017 1044   RBC 3.06 (L) 03/28/2017 1044   HGB 8.5 (L) 03/28/2017 1044   HGB 9.6 (L) 10/03/2016 1114   HGB 10.7 (L) 08/02/2015 1315   HCT 26.9 (L) 03/28/2017 1044   HCT 28.1 (L) 10/03/2016 1114   HCT 33.2 (L) 08/02/2015 1315   PLT 29 (LL) 03/28/2017 1044   PLT 22 (LL) 10/03/2016 1114   MCV 87.9 03/28/2017 1044   MCV 87 10/03/2016 1114   MCV 93.8 08/02/2015 1315   MCH 27.8 03/28/2017 1044   MCHC 31.6 03/28/2017 1044   RDW 15.1 03/28/2017 1044   RDW 13.8 10/03/2016 1114   RDW 12.7 08/02/2015 1315   LYMPHSABS 0.8 03/28/2017 1044   LYMPHSABS 1.0 10/03/2016 1114   LYMPHSABS 0.9 08/02/2015 1315   MONOABS 0.4 03/28/2017 1044   MONOABS 0.4 08/02/2015 1315   EOSABS 0.3 03/28/2017 1044   EOSABS 0.3 10/03/2016 1114  BASOSABS 0.1 03/28/2017 1044   BASOSABS 0.1 10/03/2016 1114   BASOSABS 0.1 08/02/2015 1315     Chemistry      Component Value Date/Time   NA 137 03/21/2017 1025   NA 141 11/29/2016 1049   NA 142 08/02/2015 1315   K 4.6 03/21/2017 1025   K 4.4 08/02/2015 1315   CL 107 03/21/2017 1025   CL 108 (H) 11/04/2012 1158   CO2 20 (L) 03/21/2017 1025   CO2 25 08/02/2015 1315   BUN 75 (H) 03/21/2017 1025   BUN 42 (H) 11/29/2016 1049   BUN 62.9 (H) 08/02/2015 1315   CREATININE 2.91 (H) 03/21/2017 1025   CREATININE 1.9 (H) 08/02/2015 1315      Component Value Date/Time   CALCIUM 9.0 03/21/2017 1025   CALCIUM 9.1 08/02/2015 1315   ALKPHOS 84 03/21/2017 1025   ALKPHOS 103 08/02/2015 1315   AST 20 03/21/2017 1025   AST  36 (H) 08/02/2015 1315   ALT 13 (L) 03/21/2017 1025   ALT 31 08/02/2015 1315   BILITOT <0.1 (L) 03/21/2017 1025   BILITOT 0.4 11/29/2016 1049   BILITOT 0.60 08/02/2015 1315       Radiological Studies: No results found.  Impression: Atypical intense inflammatory response with development of lymphedema left upper extremity extending to and including the hand.  Not clear how much is due to a large wound resulting from a superficial skin tear or whether or not there was some extravasation of the agent used to do her Myoview scan. She is afebrile.  No leukocytosis.  Completed prescribed course of antibiotics yesterday. I am going to put on a steroid Dosepak for symptomatic relief as well as low-dose tramadol 50 mg every 6 hours as needed for pain.  I also renewed low-dose short acting Ambien. She will be back in next week for scheduled lab and let me know if things change before then.   CC: Patient Care Team: Josetta Huddle, MD as PCP - General (Internal Medicine) Beryle Beams Alyson Locket, MD as Consulting Physician (Oncology) Jamal Maes, MD as Consulting Physician (Nephrology) Stanford Breed, Denice Bors, MD as Consulting Physician (Cardiology) Thompson Grayer, MD as Consulting Physician (Cardiology)   Murriel Hopper, MD, [place]  Hematology-Oncology/Internal Medicine     10/5/20183:17 PM

## 2017-03-30 NOTE — Patient Instructions (Signed)
Medrol steroid dose pack use as directed Keep scheduled appointment for lab & visit

## 2017-04-02 ENCOUNTER — Telehealth: Payer: Self-pay | Admitting: *Deleted

## 2017-04-02 ENCOUNTER — Telehealth: Payer: Self-pay | Admitting: Physician Assistant

## 2017-04-02 NOTE — Telephone Encounter (Signed)
LM WITH HUSBAND FOR REASONING FOR ECHOCARDIOGRAM WHICH IS A SPECIAL TYPE OF ECHO TO HELP WITH THE DEVICE SETTINGS ON HER DEVICE AND TO HELP WITH HEART FUNCTIONS.  PT HUSBAND WAS TOLD THAT PT  APPT WAS CANCELLED AND WILL HAVE TO CONTACT OFFICE FOR BACK RESCHEDULE.  PT WILL CONTACT OFFICE BACK UPON HER RETURN TO RESCHEDULE  PER HUSBAND

## 2017-04-02 NOTE — Telephone Encounter (Signed)
SPOKE TO HUSBAND WHO WANTED TO CALL BACK AND RESCHEDULE.   PT HUSBAND WAS TOLD THAT THE ORIGINAL SCHEDULER WILL HAVE TO BE CONTACTED BACK TO CONTACT THEM TO SEE IF PROCEDURE CAN BE RESCHEDULE.

## 2017-04-02 NOTE — Telephone Encounter (Signed)
New message    Pt husband is calling back for pt asking for a call back.

## 2017-04-03 ENCOUNTER — Ambulatory Visit: Payer: Medicare Other | Admitting: Nurse Practitioner

## 2017-04-03 ENCOUNTER — Other Ambulatory Visit: Payer: Self-pay | Admitting: Oncology

## 2017-04-03 ENCOUNTER — Other Ambulatory Visit (HOSPITAL_COMMUNITY): Payer: Medicare Other

## 2017-04-04 ENCOUNTER — Other Ambulatory Visit (INDEPENDENT_AMBULATORY_CARE_PROVIDER_SITE_OTHER): Payer: Medicare Other

## 2017-04-04 DIAGNOSIS — D693 Immune thrombocytopenic purpura: Secondary | ICD-10-CM | POA: Diagnosis present

## 2017-04-04 DIAGNOSIS — N184 Chronic kidney disease, stage 4 (severe): Secondary | ICD-10-CM

## 2017-04-04 DIAGNOSIS — D6861 Antiphospholipid syndrome: Secondary | ICD-10-CM

## 2017-04-04 LAB — CBC WITH DIFFERENTIAL/PLATELET
BASOS PCT: 0 %
Basophils Absolute: 0 10*3/uL (ref 0.0–0.1)
EOS ABS: 0.4 10*3/uL (ref 0.0–0.7)
Eosinophils Relative: 3 %
HCT: 27.2 % — ABNORMAL LOW (ref 36.0–46.0)
Hemoglobin: 8.5 g/dL — ABNORMAL LOW (ref 12.0–15.0)
LYMPHS ABS: 1.3 10*3/uL (ref 0.7–4.0)
Lymphocytes Relative: 10 %
MCH: 27.2 pg (ref 26.0–34.0)
MCHC: 31.3 g/dL (ref 30.0–36.0)
MCV: 86.9 fL (ref 78.0–100.0)
MONO ABS: 0.6 10*3/uL (ref 0.1–1.0)
Monocytes Relative: 5 %
NEUTROS PCT: 82 %
Neutro Abs: 10.3 10*3/uL — ABNORMAL HIGH (ref 1.7–7.7)
PLATELETS: 32 10*3/uL — AB (ref 150–400)
RBC: 3.13 MIL/uL — ABNORMAL LOW (ref 3.87–5.11)
RDW: 15.5 % (ref 11.5–15.5)
WBC: 12.6 10*3/uL — AB (ref 4.0–10.5)

## 2017-04-04 LAB — BASIC METABOLIC PANEL
Anion gap: 12 (ref 5–15)
BUN: 107 mg/dL — AB (ref 6–20)
CHLORIDE: 98 mmol/L — AB (ref 101–111)
CO2: 19 mmol/L — AB (ref 22–32)
Calcium: 8.9 mg/dL (ref 8.9–10.3)
Creatinine, Ser: 3.35 mg/dL — ABNORMAL HIGH (ref 0.44–1.00)
GFR calc Af Amer: 15 mL/min — ABNORMAL LOW (ref >60)
GFR calc non Af Amer: 13 mL/min — ABNORMAL LOW (ref >60)
GLUCOSE: 149 mg/dL — AB (ref 65–99)
POTASSIUM: 4.8 mmol/L (ref 3.5–5.1)
SODIUM: 129 mmol/L — AB (ref 135–145)

## 2017-04-04 MED ORDER — OXYCODONE HCL 5 MG PO TABS: ORAL_TABLET | ORAL | 0 refills | Status: DC

## 2017-04-04 NOTE — Addendum Note (Signed)
Addended by: Orson Gear on: 04/04/2017 10:36 AM   Modules accepted: Orders

## 2017-04-06 ENCOUNTER — Ambulatory Visit (HOSPITAL_BASED_OUTPATIENT_CLINIC_OR_DEPARTMENT_OTHER)
Admission: RE | Admit: 2017-04-06 | Discharge: 2017-04-06 | Disposition: A | Discharge status: Home / Self Care | Payer: Medicare Other | Source: Ambulatory Visit | Attending: Cardiology | Admitting: Cardiology

## 2017-04-06 VITALS — BP 138/86 | HR 86 | Wt 158.8 lb

## 2017-04-06 DIAGNOSIS — E039 Hypothyroidism, unspecified: Secondary | ICD-10-CM | POA: Insufficient documentation

## 2017-04-06 DIAGNOSIS — I5042 Chronic combined systolic (congestive) and diastolic (congestive) heart failure: Secondary | ICD-10-CM

## 2017-04-06 DIAGNOSIS — M79602 Pain in left arm: Secondary | ICD-10-CM | POA: Diagnosis not present

## 2017-04-06 DIAGNOSIS — I099 Rheumatic heart disease, unspecified: Secondary | ICD-10-CM

## 2017-04-06 DIAGNOSIS — I482 Chronic atrial fibrillation: Secondary | ICD-10-CM

## 2017-04-06 DIAGNOSIS — Z950 Presence of cardiac pacemaker: Secondary | ICD-10-CM

## 2017-04-06 DIAGNOSIS — Z952 Presence of prosthetic heart valve: Secondary | ICD-10-CM

## 2017-04-06 DIAGNOSIS — Z87891 Personal history of nicotine dependence: Secondary | ICD-10-CM | POA: Insufficient documentation

## 2017-04-06 DIAGNOSIS — S51802A Unspecified open wound of left forearm, initial encounter: Secondary | ICD-10-CM | POA: Diagnosis not present

## 2017-04-06 DIAGNOSIS — N183 Chronic kidney disease, stage 3 (moderate): Secondary | ICD-10-CM | POA: Insufficient documentation

## 2017-04-06 DIAGNOSIS — L98499 Non-pressure chronic ulcer of skin of other sites with unspecified severity: Secondary | ICD-10-CM | POA: Insufficient documentation

## 2017-04-06 DIAGNOSIS — I69354 Hemiplegia and hemiparesis following cerebral infarction affecting left non-dominant side: Secondary | ICD-10-CM | POA: Insufficient documentation

## 2017-04-06 DIAGNOSIS — D6861 Antiphospholipid syndrome: Secondary | ICD-10-CM

## 2017-04-06 DIAGNOSIS — Z5189 Encounter for other specified aftercare: Secondary | ICD-10-CM

## 2017-04-06 DIAGNOSIS — I5022 Chronic systolic (congestive) heart failure: Secondary | ICD-10-CM | POA: Insufficient documentation

## 2017-04-06 DIAGNOSIS — I4819 Other persistent atrial fibrillation: Secondary | ICD-10-CM

## 2017-04-06 DIAGNOSIS — D696 Thrombocytopenia, unspecified: Secondary | ICD-10-CM | POA: Diagnosis not present

## 2017-04-06 DIAGNOSIS — I481 Persistent atrial fibrillation: Secondary | ICD-10-CM | POA: Diagnosis not present

## 2017-04-06 DIAGNOSIS — Z9889 Other specified postprocedural states: Secondary | ICD-10-CM

## 2017-04-06 DIAGNOSIS — I890 Lymphedema, not elsewhere classified: Secondary | ICD-10-CM

## 2017-04-06 DIAGNOSIS — Z79899 Other long term (current) drug therapy: Secondary | ICD-10-CM | POA: Insufficient documentation

## 2017-04-06 DIAGNOSIS — Z7901 Long term (current) use of anticoagulants: Secondary | ICD-10-CM

## 2017-04-06 DIAGNOSIS — D693 Immune thrombocytopenic purpura: Secondary | ICD-10-CM | POA: Insufficient documentation

## 2017-04-06 DIAGNOSIS — D68312 Antiphospholipid antibody with hemorrhagic disorder: Secondary | ICD-10-CM | POA: Diagnosis not present

## 2017-04-06 DIAGNOSIS — I509 Heart failure, unspecified: Secondary | ICD-10-CM | POA: Diagnosis not present

## 2017-04-06 DIAGNOSIS — N189 Chronic kidney disease, unspecified: Secondary | ICD-10-CM | POA: Diagnosis not present

## 2017-04-06 DIAGNOSIS — T80818A Extravasation of other vesicant agent, initial encounter: Secondary | ICD-10-CM | POA: Diagnosis not present

## 2017-04-06 NOTE — Patient Instructions (Addendum)
BMET next week at your appointment on Tuesday November 11th with Dr. Jules Husbands have been referred to the [facility], they will contact you to schedule initial appointment.   Follow up in 2 Months  You have been referred to [facility]  Address: [place], [place], [place] [address] Phone: [phone]

## 2017-04-06 NOTE — Progress Notes (Signed)
Patient ID: [name], female   DOB: 04/12/1946, 71 y.o.   MRN: [id number]     Advanced Heart Failure Clinic Note   PCP: Dr. Inda Merlin Primary Cardiologist: Dr. Stanford Breed Primary HF: Dr. Aundra Dubin  Hematologist: Dr. Ruben Gottron is a 71 y.o. female extensive past history including mechanical MV and TV repair, chronic systolic CHF with EF 02-54%, chronic atrial fibrillation, prior CVA, antiphospholipid antibody syndrome, subdural hematomas x 2, and CKD presents for followup of CHF.  Patient had initial valvuloplasty at [facility] in 1995.  She later had mechanical MV replacement and TV repair complicated by CVA with left hemiparesis.  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  She is anticoagulated now with fondaparinux as she had subdural hematomas x 2 while on coumadin.  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 She has permanent atrial fibrillation (has failed cardioversion, cannot get amiodarone as she had torsades in the past with amiodarone use).  She had an adenosine Cardiolite in 10/15 that showed EF 35% with fixed defect, no ischemia.  This was thought to be scar versus pacing artifact.  Initially seen in HF clinic 05/2014. Referred back by Dr. Stanford Breed due to drop in EF in 8/18.  8/18 echo showed fall in EF from  35-40% range to 10-15% range.   She was doing well until around 4/18.  She was walking 3 miles/day without problems. In 4/18, she was started on prednisone for ITP and later romiplostim.  She developed profound exhaustion/fatigue to the point where romiplostim was stopped.  After stopping this medication (now taking Promacta to stimulate platelet production), she felt better.  While on romiplostim, she developed presyncopal spells.  Device interrogation and event monitor did not show an arrhythmic cause. Since stopping romiplostim, no further presyncopal events.  Given profound fall in EF, I had her do a Agricultural consultant.  This was done in 9/18 and showed EF 42% with  a fixed defect but no ischemia.  After the cardiolite, she had a skin tear when removing the IV from the left forearm.  She developed a large ulceration on the left forearm that has increased in size with swelling of the left forearm and hand.  She saw Dr. Beryle Beams and was treated with a course of steroids and antibiotics.  She saw a general surgeon who did not think intervention was warranted at the time.  However, the ulcer has continued to grow and the arm remains swollen.  The ulceration is painful.   Additionally, her creatinine has risen slowly, most recently up to 3.35.  She is not taking NSAIDs.  Her BP has not been low.  She is taking Lasix 20 mg daily (long-term dose).  She has a poor appetite and has been fatigued.  No orthopnea/PND.  She has not been very active with the arm problem but no chest pain or exertional dyspnea.    Device interrogated: 95% BiV pacing, she does not have Corevue.   Labs (8/18): hgb 9.6, plts 45K, K 4.8, creatinine 1.99 Labs (10/18): K 4.8, creatinine 3.35  PMH: 1. Rheumatic heart disease: Mitral valvuloplasty 1995 at [facility].  Later had mechanical MV replacement and tricuspid annuloplasty at [facility].   2. CVA: Embolic, at time of mitral valve surgery.  Had left hemiparesis.  3. H/o HIT 4. 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  5. Hypothyroidism: Hashimoto's thyroiditis.  6. Atypical atrial flutter: s/p ablation in 8/12 and repeat  in 9/12 [facility]).  7. Chronic atrial fibrillation 8. Torsades de pointes with amiodarone use.  9. Antiphospholipid antibody syndrome: Anticoagulated with fondaparinus.  10. CKD stage 3 11. Chronic systolic CHF: [employer] Jude CRT-P device.  Echo (4/15) with EF 45-50%.  Echo (6/15) with EF 35-40%, diffuse hypokinesis, normal mechanical mitral valve with mild MR, tricuspid valve s/p repair.  - Adenosine Cardiolite (10/15) with EF 35%, fixed apical anterior, apical septal, and apical defect with no ischemia.   Scar versus pacing artifact.  - Echo 02/14/17 LVEF 10-15%, Mild AI, Mod LAE, Mod RV dilation, RV systolic function moderately reduced, Mod TR, PA peak pressure 54 mm Hg.  - Lexiscan Cardiolite (9/18) with EF 42%, fixed apical anteroseptal defect with no ischemia.  12. ITP: Followed by Dr. Beryle Beams.   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   FH: No premature CAD  Review of systems complete and found to be negative unless listed in HPI.    Current Outpatient Prescriptions  Medication Sig Dispense Refill  . acetaminophen (TYLENOL) 650 MG CR tablet Take 1,300 mg by mouth 3 (three) times daily as needed for pain.     . Calcium Citrate (CITRACAL PO) Take 1 tablet by mouth daily.    . Cholecalciferol (VITAMIN D) 2000 UNITS tablet Take 2,000 Units by mouth daily.      . Coenzyme Q-10 100 MG capsule Take 100 mg by mouth daily.    Marland Kitchen eltrombopag (PROMACTA) 50 MG tablet Take 50 mg by mouth daily. Take on an empty stomach 1 hour before a meal or 2 hours after    . Estradiol Acetate (FEMRING) 0.05 MG/24HR RING Place 1 each vaginally as directed.     Marland Kitchen FLUTICASONE PROPIONATE, NASAL, NA Place 2 sprays into both nostrils daily.    . fondaparinux (ARIXTRA) 2.5 MG/0.5ML SOLN injection INJECT 0.5ML INTO THE SKIN EVERY OTHER DAY 9.5 mL 11  . furosemide (LASIX) 20 MG tablet Take 1 tablet (20 mg total) by mouth daily. 90 tablet 3  . hydrALAZINE (APRESOLINE) 25 MG tablet Take 0.5 tablets (12.5 mg total) by mouth 3 (three) times daily. 45 tablet 3  . isosorbide mononitrate (IMDUR) 30 MG 24 hr tablet Take 1 tablet (30 mg total) by mouth daily. 30 tablet 3  . levothyroxine (SYNTHROID, LEVOTHROID) 88 MCG tablet Take 88 mcg by mouth daily before breakfast.    . methylPREDNISolone (MEDROL) 4 MG TBPK tablet Medrol dose pak Use as directed 21 tablet 1  . metoprolol succinate (TOPROL-XL) 50 MG 24 hr tablet TAKE ONE (1) TABLET BY MOUTH TWO (2) TIMES DAILY TAKE WITH OR IMMEDIATELY FOLLOWING A MEAL  180 tablet 3  . Misc Natural Products (TART CHERRY ADVANCED PO) Take 2 tablets by mouth daily.    . Omega-3 Fatty Acids (OMEGA-3 FISH OIL) 500 MG CAPS Take 1 capsule by mouth daily.    Marland Kitchen oxyCODONE (ROXICODONE) 5 MG immediate release tablet 1 or 2 tablets every 6 hours as needed for pain 60 tablet 0  . traMADol (ULTRAM) 50 MG tablet Take 1 tablet (50 mg total) by mouth every 6 (six) hours as needed for severe pain. 30 tablet 2  . valACYclovir (VALTREX) 1000 MG tablet Take 1,000 mg by mouth 2 (two) times daily. For a total of 3 doses for labial herpes prn only    . zolpidem (AMBIEN) 5 MG tablet Take 1 tablet (5 mg total) by mouth at bedtime as needed for sleep. 30 tablet 1  . AMOXICILLIN PO Take  4 capsules by mouth as directed. ONLY WHEN VISITING DENTIST     No current facility-administered medications for this encounter.    Vitals:   04/06/17 0911  BP: 138/86  Pulse: 86  SpO2: 94%  Weight: 158 lb 12.8 oz (72 kg)   Wt Readings from Last 3 Encounters:  04/06/17 158 lb 12.8 oz (72 kg)  03/30/17 159 lb 3.2 oz (72.2 kg)  03/13/17 158 lb 6.4 oz (71.8 kg)    General: NAD Neck: JVP 10 cm, no thyromegaly or thyroid nodule.  Lungs: Clear to auscultation bilaterally with normal respiratory effort. CV: Nondisplaced PMI.  Heart irregular S1/S2, mechanical S1, no S3/S4, 2/6 SEM RUSB.  No ankle edema.  No carotid bruit.  Normal pedal pulses.  Abdomen: Soft, nontender, no hepatosplenomegaly, no distention.  Skin: Left forearm with large superficial ulcer and swelling of the entire forearm and hand.  Neurologic: Alert and oriented x 3.  Psych: Normal affect. Extremities: No clubbing or cyanosis.  HEENT: Normal.   Assessment/Plan:  1. Rheumatic heart disease: S/p MV replacement (mechanical) and TV repair.  Echo in 8/18 showed normal mechanical mitral valve. There was moderate TR.  She is anticoagulated with fondaparinux chronically given subdural hematomas x 2 on coumadin.  She is not on aspirin  given low platelets.   2. Atrial fibrillation: Permanent.  Patient has failed DCCV and amiodarone caused torsades.  PCM check today showed 95% BiV pacing. As above, she is on fondaprinux.  Rate control is adequate on Toprol XL.  3. CKD:  Stage 3-4.  Creatinine has risen up to 3.35 recently, no definite cause identified.  She is on Lasix 20 mg daily and actually appears volume overloaded.  No NSAID use.  BP has not been low.   - Repeat BMET next week.  - She needs followup with her nephrologist.  4. Chronic systolic CHF: Suspected nonischemic cardiomyopathy.  EF 35-40% on 6/15 echo, decreased to 10-15% with moderately decreased RV systolic function on 4/03 echo.  She has a Physiological scientist.  Interestingly, EF was 42% on 9/18 Cardiolite.  ?Fall in EF in 8/18 due to stress cardiomyopathy given the issues she was going through at that time?  On exam today, she remains at least mildly volume overloaded.  NYHA class II probably but not very active.  - Repeat echo to confirm rise in EF that was noted on Cardiolite.    - Continue Lasix at 20 mg daily.  Will not increase with rise in creatinine recently.   - Continue current Toprol XL.  - She is not on ACEI/ARB/ARNI/spironolactone due to CKD, creatinine rising. - Continue hydralazine 12.5 mg tid + Imdur 30 mg daily.  BP stable.  - She had a discussion with Dr. Rayann Heman and decided against defibrillator (has CRT-P device).  5. APLAS: Stable.  6. ITP: Platelets 45K in 8/18.  She is on Promacta now.   7. H/o CVA: Mild residual left-sided weakness.  8. Left forearm wound: This has been present for a month and looks worse compared to picture in the computer from Dr. Azucena Freed office.  We talked to Dr. Migdalia Dk with plastic surgery who will see her today.  Will also make referral to the [facility] wound clinic.   Followup 2 months.   Loralie Champagne 04/06/2017

## 2017-04-09 ENCOUNTER — Emergency Department (HOSPITAL_COMMUNITY)
Admission: EM | Admit: 2017-04-09 | Discharge: 2017-04-09 | Disposition: A | Discharge status: Other Acute Inpatient Hospital | Payer: Medicare Other

## 2017-04-09 ENCOUNTER — Inpatient Hospital Stay (HOSPITAL_COMMUNITY)
Admission: AD | Admit: 2017-04-09 | Discharge: 2017-04-12 | DRG: 901 | Disposition: A | Discharge status: Home Health | Payer: Medicare Other | Source: Ambulatory Visit | Attending: Internal Medicine | Admitting: Internal Medicine

## 2017-04-09 ENCOUNTER — Inpatient Hospital Stay (HOSPITAL_COMMUNITY): Payer: Medicare Other

## 2017-04-09 ENCOUNTER — Encounter (HOSPITAL_COMMUNITY): Payer: Self-pay | Admitting: *Deleted

## 2017-04-09 DIAGNOSIS — L089 Local infection of the skin and subcutaneous tissue, unspecified: Secondary | ICD-10-CM | POA: Diagnosis not present

## 2017-04-09 DIAGNOSIS — I110 Hypertensive heart disease with heart failure: Secondary | ICD-10-CM | POA: Diagnosis not present

## 2017-04-09 DIAGNOSIS — D6859 Other primary thrombophilia: Secondary | ICD-10-CM | POA: Diagnosis present

## 2017-04-09 DIAGNOSIS — I96 Gangrene, not elsewhere classified: Secondary | ICD-10-CM | POA: Diagnosis present

## 2017-04-09 DIAGNOSIS — D693 Immune thrombocytopenic purpura: Secondary | ICD-10-CM | POA: Diagnosis not present

## 2017-04-09 DIAGNOSIS — S41101A Unspecified open wound of right upper arm, initial encounter: Secondary | ICD-10-CM | POA: Diagnosis not present

## 2017-04-09 DIAGNOSIS — T50905A Adverse effect of unspecified drugs, medicaments and biological substances, initial encounter: Secondary | ICD-10-CM | POA: Diagnosis not present

## 2017-04-09 DIAGNOSIS — B965 Pseudomonas (aeruginosa) (mallei) (pseudomallei) as the cause of diseases classified elsewhere: Secondary | ICD-10-CM | POA: Diagnosis present

## 2017-04-09 DIAGNOSIS — S51802A Unspecified open wound of left forearm, initial encounter: Secondary | ICD-10-CM | POA: Diagnosis not present

## 2017-04-09 DIAGNOSIS — I428 Other cardiomyopathies: Secondary | ICD-10-CM | POA: Diagnosis not present

## 2017-04-09 DIAGNOSIS — Z888 Allergy status to other drugs, medicaments and biological substances status: Secondary | ICD-10-CM

## 2017-04-09 DIAGNOSIS — I69354 Hemiplegia and hemiparesis following cerebral infarction affecting left non-dominant side: Secondary | ICD-10-CM

## 2017-04-09 DIAGNOSIS — Z950 Presence of cardiac pacemaker: Secondary | ICD-10-CM

## 2017-04-09 DIAGNOSIS — I130 Hypertensive heart and chronic kidney disease with heart failure and stage 1 through stage 4 chronic kidney disease, or unspecified chronic kidney disease: Secondary | ICD-10-CM | POA: Diagnosis present

## 2017-04-09 DIAGNOSIS — Z862 Personal history of diseases of the blood and blood-forming organs and certain disorders involving the immune mechanism: Secondary | ICD-10-CM | POA: Diagnosis not present

## 2017-04-09 DIAGNOSIS — D6959 Other secondary thrombocytopenia: Secondary | ICD-10-CM | POA: Diagnosis not present

## 2017-04-09 DIAGNOSIS — I482 Chronic atrial fibrillation: Secondary | ICD-10-CM | POA: Diagnosis present

## 2017-04-09 DIAGNOSIS — Z952 Presence of prosthetic heart valve: Secondary | ICD-10-CM

## 2017-04-09 DIAGNOSIS — X58XXXA Exposure to other specified factors, initial encounter: Secondary | ICD-10-CM | POA: Diagnosis not present

## 2017-04-09 DIAGNOSIS — I4891 Unspecified atrial fibrillation: Secondary | ICD-10-CM | POA: Diagnosis present

## 2017-04-09 DIAGNOSIS — L7682 Other postprocedural complications of skin and subcutaneous tissue: Principal | ICD-10-CM | POA: Diagnosis present

## 2017-04-09 DIAGNOSIS — Z452 Encounter for adjustment and management of vascular access device: Secondary | ICD-10-CM | POA: Diagnosis not present

## 2017-04-09 DIAGNOSIS — S41102A Unspecified open wound of left upper arm, initial encounter: Secondary | ICD-10-CM | POA: Diagnosis present

## 2017-04-09 DIAGNOSIS — S41109A Unspecified open wound of unspecified upper arm, initial encounter: Secondary | ICD-10-CM | POA: Diagnosis not present

## 2017-04-09 DIAGNOSIS — N184 Chronic kidney disease, stage 4 (severe): Secondary | ICD-10-CM | POA: Diagnosis present

## 2017-04-09 DIAGNOSIS — Y848 Other medical procedures as the cause of abnormal reaction of the patient, or of later complication, without mention of misadventure at the time of the procedure: Secondary | ICD-10-CM | POA: Diagnosis present

## 2017-04-09 DIAGNOSIS — D631 Anemia in chronic kidney disease: Secondary | ICD-10-CM | POA: Diagnosis not present

## 2017-04-09 DIAGNOSIS — I5022 Chronic systolic (congestive) heart failure: Secondary | ICD-10-CM | POA: Diagnosis not present

## 2017-04-09 DIAGNOSIS — Z959 Presence of cardiac and vascular implant and graft, unspecified: Secondary | ICD-10-CM | POA: Diagnosis not present

## 2017-04-09 DIAGNOSIS — E785 Hyperlipidemia, unspecified: Secondary | ICD-10-CM | POA: Diagnosis present

## 2017-04-09 DIAGNOSIS — Z87891 Personal history of nicotine dependence: Secondary | ICD-10-CM | POA: Diagnosis not present

## 2017-04-09 DIAGNOSIS — E039 Hypothyroidism, unspecified: Secondary | ICD-10-CM | POA: Diagnosis not present

## 2017-04-09 DIAGNOSIS — Z978 Presence of other specified devices: Secondary | ICD-10-CM | POA: Diagnosis not present

## 2017-04-09 DIAGNOSIS — Z7901 Long term (current) use of anticoagulants: Secondary | ICD-10-CM | POA: Diagnosis not present

## 2017-04-09 DIAGNOSIS — I69954 Hemiplegia and hemiparesis following unspecified cerebrovascular disease affecting left non-dominant side: Secondary | ICD-10-CM | POA: Diagnosis not present

## 2017-04-09 DIAGNOSIS — N189 Chronic kidney disease, unspecified: Secondary | ICD-10-CM | POA: Diagnosis not present

## 2017-04-09 DIAGNOSIS — Z79899 Other long term (current) drug therapy: Secondary | ICD-10-CM | POA: Diagnosis not present

## 2017-04-09 DIAGNOSIS — I5043 Acute on chronic combined systolic (congestive) and diastolic (congestive) heart failure: Secondary | ICD-10-CM | POA: Diagnosis not present

## 2017-04-09 DIAGNOSIS — I351 Nonrheumatic aortic (valve) insufficiency: Secondary | ICD-10-CM | POA: Diagnosis not present

## 2017-04-09 DIAGNOSIS — T2230XA Burn of third degree of shoulder and upper limb, except wrist and hand, unspecified site, initial encounter: Secondary | ICD-10-CM | POA: Diagnosis not present

## 2017-04-09 DIAGNOSIS — D6861 Antiphospholipid syndrome: Secondary | ICD-10-CM | POA: Diagnosis present

## 2017-04-09 DIAGNOSIS — D696 Thrombocytopenia, unspecified: Secondary | ICD-10-CM | POA: Diagnosis not present

## 2017-04-09 DIAGNOSIS — D7582 Heparin induced thrombocytopenia (HIT): Secondary | ICD-10-CM | POA: Diagnosis not present

## 2017-04-09 DIAGNOSIS — I5020 Unspecified systolic (congestive) heart failure: Secondary | ICD-10-CM | POA: Diagnosis not present

## 2017-04-09 DIAGNOSIS — I5042 Chronic combined systolic (congestive) and diastolic (congestive) heart failure: Secondary | ICD-10-CM | POA: Diagnosis not present

## 2017-04-09 DIAGNOSIS — M79632 Pain in left forearm: Secondary | ICD-10-CM | POA: Diagnosis not present

## 2017-04-09 HISTORY — PX: IR FLUORO GUIDE CV MIDLINE PICC RIGHT: IMG5212

## 2017-04-09 HISTORY — PX: IR US GUIDE VASC ACCESS RIGHT: IMG2390

## 2017-04-09 LAB — COMPREHENSIVE METABOLIC PANEL
ALBUMIN: 2.9 g/dL — AB (ref 3.5–5.0)
ALT: 14 U/L (ref 14–54)
ANION GAP: 11 (ref 5–15)
AST: 20 U/L (ref 15–41)
Alkaline Phosphatase: 96 U/L (ref 38–126)
BUN: 113 mg/dL — ABNORMAL HIGH (ref 6–20)
CO2: 22 mmol/L (ref 22–32)
Calcium: 8.5 mg/dL — ABNORMAL LOW (ref 8.9–10.3)
Chloride: 101 mmol/L (ref 101–111)
Creatinine, Ser: 3.13 mg/dL — ABNORMAL HIGH (ref 0.44–1.00)
GFR calc Af Amer: 16 mL/min — ABNORMAL LOW (ref >60)
GFR calc non Af Amer: 14 mL/min — ABNORMAL LOW (ref >60)
GLUCOSE: 165 mg/dL — AB (ref 65–99)
POTASSIUM: 3.6 mmol/L (ref 3.5–5.1)
SODIUM: 134 mmol/L — AB (ref 135–145)
Total Bilirubin: <0.1 mg/dL — ABNORMAL LOW (ref 0.3–1.2)
Total Protein: 6.2 g/dL — ABNORMAL LOW (ref 6.5–8.1)

## 2017-04-09 LAB — CBC
HCT: 25.1 % — ABNORMAL LOW (ref 36.0–46.0)
HEMOGLOBIN: 8 g/dL — AB (ref 12.0–15.0)
MCH: 27.5 pg (ref 26.0–34.0)
MCHC: 31.9 g/dL (ref 30.0–36.0)
MCV: 86.3 fL (ref 78.0–100.0)
Platelets: 19 10*3/uL — CL (ref 150–400)
RBC: 2.91 MIL/uL — ABNORMAL LOW (ref 3.87–5.11)
RDW: 15.5 % (ref 11.5–15.5)
WBC: 8.9 10*3/uL (ref 4.0–10.5)

## 2017-04-09 LAB — PROTIME-INR
INR: 1.2
Prothrombin Time: 15.1 seconds (ref 11.4–15.2)

## 2017-04-09 LAB — APTT: APTT: 49 s — AB (ref 24–36)

## 2017-04-09 LAB — MRSA PCR SCREENING: MRSA BY PCR: NEGATIVE

## 2017-04-09 LAB — HEPARIN ANTI-XA: HEPARIN LMW: 0.27 [IU]/mL

## 2017-04-09 MED ORDER — ARGATROBAN 50 MG/50ML IV SOLN
1.0000 ug/kg/min | INTRAVENOUS | Status: DC
  Filled 2017-04-09: qty 50

## 2017-04-09 MED ORDER — LEVOTHYROXINE SODIUM 88 MCG PO TABS
88.0000 ug | ORAL_TABLET | Freq: Every day | ORAL | Status: DC
  Administered 2017-04-10 – 2017-04-12 (×2): 88 ug via ORAL
  Filled 2017-04-09 (×2): qty 1

## 2017-04-09 MED ORDER — HYDRALAZINE HCL 25 MG PO TABS
12.5000 mg | ORAL_TABLET | Freq: Three times a day (TID) | ORAL | Status: DC
  Administered 2017-04-10 – 2017-04-12 (×6): 12.5 mg via ORAL
  Filled 2017-04-09 (×7): qty 1

## 2017-04-09 MED ORDER — METOPROLOL SUCCINATE ER 50 MG PO TB24
50.0000 mg | ORAL_TABLET | Freq: Two times a day (BID) | ORAL | Status: DC
  Administered 2017-04-10 – 2017-04-12 (×5): 50 mg via ORAL
  Filled 2017-04-09 (×5): qty 1

## 2017-04-09 MED ORDER — FUROSEMIDE 20 MG PO TABS
20.0000 mg | ORAL_TABLET | Freq: Every day | ORAL | Status: DC
  Administered 2017-04-10: 20 mg via ORAL
  Filled 2017-04-09: qty 1

## 2017-04-09 MED ORDER — DIPHENHYDRAMINE HCL 50 MG/ML IJ SOLN
12.5000 mg | Freq: Four times a day (QID) | INTRAMUSCULAR | Status: DC | PRN
  Administered 2017-04-09 – 2017-04-10 (×2): 12.5 mg via INTRAVENOUS
  Filled 2017-04-09 (×2): qty 1

## 2017-04-09 MED ORDER — LIDOCAINE HCL 1 % IJ SOLN
INTRAMUSCULAR | Status: DC | PRN
  Administered 2017-04-09: 10 mL

## 2017-04-09 MED ORDER — LIDOCAINE HCL 1 % IJ SOLN
INTRAMUSCULAR | Status: AC
  Filled 2017-04-09: qty 20

## 2017-04-09 MED ORDER — ELTROMBOPAG OLAMINE 25 MG PO TABS
100.0000 mg | ORAL_TABLET | Freq: Once | ORAL | Status: AC
  Administered 2017-04-10: 100 mg via ORAL
  Filled 2017-04-09 (×2): qty 4

## 2017-04-09 MED ORDER — OXYCODONE HCL 5 MG PO TABS
5.0000 mg | ORAL_TABLET | Freq: Four times a day (QID) | ORAL | Status: DC | PRN
  Administered 2017-04-09 – 2017-04-12 (×5): 5 mg via ORAL
  Filled 2017-04-09 (×5): qty 1

## 2017-04-09 MED ORDER — FONDAPARINUX SODIUM 2.5 MG/0.5ML ~~LOC~~ SOLN: 2.5000 mg | Freq: Once | SUBCUTANEOUS | Status: DC

## 2017-04-09 MED ORDER — ACETAMINOPHEN 325 MG PO TABS
650.0000 mg | ORAL_TABLET | Freq: Four times a day (QID) | ORAL | Status: DC | PRN
  Administered 2017-04-09 – 2017-04-12 (×5): 650 mg via ORAL
  Filled 2017-04-09 (×4): qty 2

## 2017-04-09 MED ORDER — ARGATROBAN 50 MG/50ML IV SOLN
0.8000 ug/kg/min | INTRAVENOUS | Status: DC
  Administered 2017-04-09 – 2017-04-11 (×4): 1 ug/kg/min via INTRAVENOUS
  Filled 2017-04-09 (×4): qty 50

## 2017-04-09 MED ORDER — IMMUNE GLOBULIN (HUMAN) 20 GM/200ML IV SOLN
1.0000 g/kg | INTRAVENOUS | Status: DC
  Administered 2017-04-09: 70 g via INTRAVENOUS
  Filled 2017-04-09 (×2): qty 100

## 2017-04-09 MED ORDER — ELTROMBOPAG OLAMINE 25 MG PO TABS
50.0000 mg | ORAL_TABLET | Freq: Once | ORAL | Status: DC
  Filled 2017-04-09 (×2): qty 2

## 2017-04-09 MED ORDER — ISOSORBIDE MONONITRATE ER 30 MG PO TB24
30.0000 mg | ORAL_TABLET | Freq: Every day | ORAL | Status: DC
  Administered 2017-04-10 – 2017-04-12 (×2): 30 mg via ORAL
  Filled 2017-04-09 (×2): qty 1

## 2017-04-09 MED ORDER — SODIUM CHLORIDE 0.9% FLUSH
10.0000 mL | INTRAVENOUS | Status: DC | PRN
  Administered 2017-04-11: 20 mL
  Filled 2017-04-09: qty 40

## 2017-04-09 NOTE — H&P (Signed)
Date: 04/09/2017               Patient Name:  [name] MRN: [id number]  DOB: May 03, 1946 Age / Sex: 71 y.o., female   PCP: Josetta Huddle, MD         Medical Service: Internal Medicine Teaching Service         Attending Physician: Dr. Lucious Groves, DO    First Contact: Dr. Aggie Hacker Pager: [phone]  Second Contact: Dr. Jari Favre Pager: [phone]       After Hours (After 5p/  First Contact Pager: [phone]  weekends / holidays): Second Contact Pager: [phone]   Chief Complaint: left arm pain, swelling, and left forearm necrosis   History of Present Illness:  71 y.o. 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 The patient had a Lexiscan stress test on 03/06/2017 after an echocardiogram was performed in August that showed reduced EF to 10-15%.  The Lexiscan stress test estimated an EF of approximately 40%. After the stress test, she had a skin tear when removing the IV from the left forearm. She subsequently developed a large ulceration on the left forearm that has increased in size with swelling of the left forearm and hand. She saw Dr. Beryle Beams and was treated with a course of steroids and antibiotics, which did not improve the swelling, pain, or ulceration. Dr. Darnell Level referred her to a general surgeon the same day, who did not think intervention was warranted.  Dr. Darnell Level evaluated her again several weeks later and put her on a short Dosepak of Solu-Medrol and prescribed Tramadol for pain, and again referred her to the surgeon.  He did not feel she needed debridement at that point. However,  the ulcer has continued to grow and the arm remains swollen and painful. She denies numbness or tingling. She denies fever, chills, nausea, or vomiting. She has no other symptoms except for pain in her left forearm, which she describes the pain as a burning sensation that radiates up her arm. Oxycodone helps relieve the pain.   She will being having surgery with Dr. Marla Roe on Wednesday October 17th for debridement of the left arm wound. Because of the patients complex history including thrombocytopenia and antiphospholipid antibody syndrome, she will require medical optimization prior to surgery.     Meds:  No outpatient prescriptions have been marked as taking for the 04/09/17 encounter [facility] Encounter).     Allergies: Allergies as of 04/06/2017 - Review Complete 04/06/2017  Allergen Reaction Noted  . Amiodarone hcl Other (See Comments)   . Heparin Other (See Comments)   . Vitamin k Anaphylaxis   . Iodinated diagnostic agents Itching 10/10/2010   Past Medical History:  Diagnosis Date  . Acute ITP ([place]) 10/09/2016  . Anemia   . Antiphospholipid antibody syndrome ([place])   . Arthritis    "may have some" (03/28/2012)  . Atrial flutter ([place])    atypical right atrial flutter s/p ablation at [facility] by Dr Clyda Hurdle  . Cerebrovascular accident [facility]) ?1990; 1993; 2007   residual "can't use my right hand; left visual field cut" (03/28/2012)  . CHF (congestive heart failure) ([place])   . Chronic ITP (idiopathic  thrombocytopenia) ([place]) 12/06/2016  . Chronic kidney disease    "as a result of my heart failing" (03/28/2012)  . Fine motor impairment    "fingers right hand" (03/28/2012)  . Heart murmur   . 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 History of blood transfusion    "lots; most in 2007 w/MVR OR"  . History of Hashimoto thyroiditis   . HLD (hyperlipidemia)   . HTN (hypertension)   . Hypothyroidism   . Lymphedema of left arm 03/30/2017  . Other activity(E029.9)    Torsades with  amiodarone  . Pacemaker   . Rheumatic heart disease 1995   post percutaneous valvuloplasty at [facility] with subsequent mechanical mitral valve replacement and tricuspid annuloplasty at [facility]  . Right leg swelling 10/06/2013   Asymmetric; no calf pain, venous doppler negative for DVT 10/06/13  . Subdural hematoma ([place]) 2009   on coumadin    Family History:  Family History  Problem Relation Age of Onset  . Heart disease Father        late 51's early 80's-CABG  . Dementia Father   . Diabetes Father   . Melanoma Father   . Cancer - Colon Mother   . Cancer - Lung Brother     Social History:  Social History   Social History  . Marital status: Married    Spouse name: Alease Medina   . Number of children: 0  . Years of education: college   Occupational History  . PHYSICIAN Retired    retired   Social History Main Topics  . Smoking status: Former Smoker    Packs/day: 1.00    Years: 5.00    Types: Cigarettes    Quit date: 04/09/1992  . Smokeless tobacco: Never Used     Comment: 03/28/2012 "smoked in the 1970's"  . Alcohol use 0.0 oz/week     Comment: wine rarely.  . Drug use: No  . Sexual activity: Yes    Partners: Male    Birth control/ protection: None   Other Topics Concern  . 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 Lives at home with her husband.  Quit smoking 25 years ago and quit alcohol 2 years ago.   Denies alcohol, caffeine,  and illicit drug use.      Review of Systems: A complete ROS was negative except as per HPI.  Physical Exam: Vital signs: Blood pressure 138/86, pulse 86 regular, temperature 98, respirations not recorded, oxygen General: Laying in bed comfortably, NAD, Multiple ecchymoses on her skin. HEENT: [place]/AT, EOMI, no scleral icterus, PERRL Cardiac: RRR, No R/M/G appreciated, mechanical valve sounds appreciated, pacemaker battery pack subcutaneously on left chest wall Pulm: normal effort,  CTAB Abd: soft, non tender, non distended, BS normal Ext: Large necrotic eschar on surface of left forearm, significant soft tissue swelling including the dorsum of the hand and extending all the way up to mid arm. Radial pulse 2+; Symmetric ulnar pulse 2+ symmetric,   Neuro: alert and oriented X3, cranial nerves II-XII grossly intact  EKG: none to interpret   CXR: none to interpret   Assessment & Plan by Problem: Active Problems:   Arm wound, left, initial encounter   S/P MVR (mitral valve replacement) Left Arm wound Thrombocytopenia Antiphospholipid antibody syndrome The patient has a complex medical history and is presenting for debridement of her left forearm, and needs medical optimization. The patient's most recent platelet count was 32,000>> Platelets on admission  19,000. She has failed thrombopeitic agent Romiplostim, and was recently started her on Promacta which she has had minimal response to maximum dose. She previously responded well to IVIG. She is anticoagulated with Arixtra, but has not taken it on over four days. Patient received right antecubital PICC line today via IR.  -IVIG x 2, first dose 10/15 PM  -Benadryl q6 hours prior to IVIG  -Hold Arixtra and use parenteral direct thrombin inhibitor Argatroban perioperatively for anticoagulation -Pain control with Oxycodone IR 5 mg q6 hours PRN -CBC in AM -Platelets pending from afternoon lab draw   HFrEF Recent Lexiscan myoview in 02/2017 showed estimated EF of 42%. Improved from prior ECHO in 11/2016 that showed an estimated EF 10-15%.  -Continue PO Lasix 20 mg oral daily -Metoprolol 50 mg BID -Hydralazine 12.5 mg TID -Imdur 30 mg QD   Chronic, refractory atrial fibrillation  Has previously failed multiple DCCV and ablation procedures. Cannot get amiodarone as she has had torsades in the past.  -Metoprolol 50 mg BID -Transition to parenteral Argatroban for anticoagulation   CKD Patients creatinine has slowly been  increasing over the past several months, 1.73 in 11/2016. Most recent Cr 3.13, with GFR of 14, BUN 113. She does not want to be placed on dialysis.  -BMET in the AM  Hypothyroidism -Continue Synthroid 88 mcg daily     Dispo: Admit patient to Inpatient with expected length of stay greater than 2 midnights.  Signed: Melanee Spry, MD 04/09/2017, 7:12 PM  Pager: [phone]

## 2017-04-09 NOTE — Progress Notes (Signed)
Pt arrived via wheelchair accompanied by husband. A&Ox4, RA and not in distress. L FA with ACE wrapped. Dr. Jari Favre paged and made aware of pt arrival on the unit.

## 2017-04-09 NOTE — Progress Notes (Signed)
ANTICOAGULATION CONSULT NOTE - Initial Consult  Pharmacy Consult for Argatroban Indication: Valve (history of HIT, previously on Arixtra, bleed with warfarin)  Allergies  Allergen Reactions  . Amiodarone Hcl Other (See Comments)    "torsades; v tach"  . Coumadin [Warfarin Sodium] Other (See Comments)    "bled into my head"  . 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 Vitamin K Anaphylaxis    IV only allergy  . Iodinated Diagnostic Agents Itching    "over 35 years ago" (03/28/2012)  . Metrizamide Itching    "over 35 years ago" (03/28/2012)    Labs:  Recent Labs  04/09/17 1812  HGB 8.0*  HCT 25.1*  PLT PENDING    Estimated Creatinine Clearance: 14.9 mL/min (A) (by C-G formula based on SCr of 3.35 mg/dL (H)).   Medical History: Past Medical History:  Diagnosis Date  . Acute ITP ([place]) 10/09/2016  . Anemia   . Antiphospholipid antibody syndrome ([place])   . Arthritis    "may have some" (03/28/2012)  . Atrial flutter ([place])    atypical right atrial flutter s/p ablation at [facility] by Dr Clyda Hurdle  . Cerebrovascular accident [facility]) ?1990; 1993; 2007   residual "can't use my right hand; left visual field cut" (03/28/2012)  . CHF (congestive heart failure) ([place])   . 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 Chronic kidney disease    "as a result of my heart failing" (03/28/2012)  . Fine motor impairment    "fingers right hand" (03/28/2012)  . Heart murmur   . 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 History of blood transfusion    "lots; most in 2007 w/MVR OR"  . History of Hashimoto thyroiditis   . HLD (hyperlipidemia)   . HTN (hypertension)   . Hypothyroidism   . Lymphedema of left arm 03/30/2017  . 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 Pacemaker   . Rheumatic heart disease 1995   post percutaneous valvuloplasty at [facility] with subsequent mechanical mitral valve replacement and tricuspid annuloplasty at [facility]  . Right leg  swelling 10/06/2013   Asymmetric; no calf pain, venous doppler negative for DVT 10/06/13  . Subdural hematoma ([place]) 2009   on coumadin     Assessment: 71 year old female on Arixtra prior to admission for a valve replacement following a long complicated anti-coagulation history.  Arixtra currently on hold for surgical debridement of a left forearm wound.  Pharmacy consulted to start Argatroban while Arixtra is on hold.  IVIG for improvement for platelets  Goal of Therapy:  PTT = 50 to 90 seconds Monitor platelets by anticoagulation protocol: Yes   Plan:  Argatroban at 1 mcg/kg/min to start after IVIG infusion PTT 2 hours after Argatroban starts  Daily PTT, CBC  Thank you Anette Guarneri, PharmD [phone]  04/09/2017,6:45 PM

## 2017-04-09 NOTE — Progress Notes (Signed)
Medicine attending admission note: I personally interviewed and examined this patient and reviewed all pertinent clinical, laboratory, and imaging data, and I attest to the accuracy of the admission evaluation and plan which will be recorded by resident physician Dr. Rochele Pages and which we discussed together in detail.  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  Initial anticoagulation with aspirin plus warfarin.  She subsequently developed valvular heart disease by some records related to rheumatic heart disease but I feel more likely directly related to the antiphospholipid antibody syndrome.  She had an initial mitral valvuloplasty at [facility] but she developed acute congestive failure and DIC and had to have emergent valve replacement with a mechanical mitral valve at [facility] in October 2007.  She developed perioperative right brain stroke with resulting left upper extremity plegia. She suffered a subdural hematoma while on warfarin following minor head trauma requiring emergency surgery in December 2008.  Attempts to put her back on warfarin resulted in recurrent bleeding.  She had an anaphylactic reaction to IV vitamin K given to reverse the warfarin at time of the second subdural. She developed heparin induced thrombocytopenia without thrombosis following an elective hysterectomy.  In view of this, she has been anticoagulated with fondaparinux (Arixtra) maintaining low molecular weight heparin levels in the 0.8-1.1 range. Despite the mitral valve replacement, she developed problems with refractory atrial arrhythmias requiring multiple DC cardioversions and attempts at radiofrequency ablation.  She required a biventricular pacemaker in October 2013 for symptomatic bradycardia.  She developed ventricular tachycardia  on amiodarone.  She always had borderline thrombocytopenia in the 80-120000 range from her antiphospholipid antibody syndrome.  However, over the last year her platelet count has steadily declined to values as low as 19,000 by July, 2018.  She had a minimal rise in her platelets to 44,000 on a trial of high-dose dexamethasone with subsequent rapid fall.  She was given 2 doses of intravenous immunoglobulin in August and had a prompt rise in her platelet count to 131,000 which predictably began to fall again and by September 18 platelet count back to 23,000.  She was put on a trial of thrombopoietin receptor agonist Romiplostim weekly subcutaneous injections.  She developed profound fatigue which she related to starting the drug but on subsequent evaluation by cardiology was found to have a severe cardiomyopathy with left ventricular ejection fraction estimated at 10-15% on echocardiogram done August 22.  She came back for a stress Myoview study on September 11.  Ejection fraction on this study estimated at between 30 and 44%.  There was a fixed distal anteroseptal defect possibly due to ventricular pacing.  No ischemia. She reports that when the adhesive tape that was placed over the intravenous site in the left antecubital fossa was removed, it tore the skin.  I initially evaluated her arm on September 18.  There was a large approximately 20 x 20 cm necrotic-appearing eschar on the volar surface of her left forearm.  Minimal surrounding erythema.  I started her on a 10-day course of Keflex.  She was seen by surgery the same day.  They advised close observation alone.  She started to develop significant swelling of the entire arm and hand and severe pain.  I saw her again on October 5.  Please see photo in chart.  No improvement at that point.  I put her on a short Dosepak  of Solu-Medrol and prescribed Toradol for pain.  I had her see the surgeon again.  He did not feel she needed debridement at that point.  She  saw her cardiologist Dr. Marigene Ehlers on October 12.  He was concerned enough to get an urgent plastic surgery consultation.  She was evaluated by Dr. Henrietta Hoover who called me on Friday to report serious concerns about the arm.  She felt the findings were limb threatening and advised urgent surgical debridement.  She is admitted at this time to get her ready for the surgery.  She failed the Romiplostim.  I started her on Promacta at time of the September 18 visit.  She has had minimal response to maximum dose now at 75 mg daily with most recent platelet count on October 10 of only 32,000. She will receive IVIG to see if we can get her platelets into a safe range for surgery.  We will need to hold her Arixtra and use parenteral direct thrombin inhibitor Argatroban perioperatively.  Current exam: Vital signs: Blood pressure 138/86, pulse 86 regular, temperature 98, respirations not recorded, oxygen saturation 94% on room air Multiple ecchymoses on her skin. Lungs are clear and resonant to percussion.  Regular cardiac rhythm with mechanical heart valve sounds.  No gross murmur.  Pacemaker battery pack subcutaneous tissues left upper chest. Neurologic: She is alert, awake, oriented, full extraocular movements, pupils equal round reactive to light, no facial asymmetry, tongue is midline, palate elevates symmetrically, motor strength right upper and right lower and left lower extremity normal.  Left upper extremity: Cannot be examined because of severe soft tissue swelling but she has chronic mild weakness of her left hand grip and clumsiness of her fingers on finger to finger and rapid alternating movements. Reflexes 2+ at the biceps on the right, cannot be tested on the left, and 3+ at the patellae Extremities: Large necrotic eschar volar surface left forearm not significantly changed from my exam on October 5.  Persistent soft tissue swelling including the dorsum of the hand and extending all the way up to  mid arm.   Vascular: Radial pulse 2+.  Symmetric ulnar pulse 2+ symmetric, No cyanosis.  Lab pending  Impression: 1.  Large necrotic superficial cutaneous ulcer left forearm with soft tissue swelling of the entire arm including the dorsum of the hand. I am concerned that this was more than just a superficial skin tear and that perhaps the radionuclide solution used for the Myoview study infiltrated into the surrounding tissues. Plastic surgeon feels this is a surgical emergency.  Due to her complex anticoagulation and platelet issues she is admitted at this time to get her prepared for surgery. 2.  Antiphospholipid antibody syndrome with recurrent thrombotic events requiring chronic anticoagulation 3.  Progressive thrombocytopenia minimally responsive to all treatments tried to date but she did get a good temporary response to IVIG. 4.  Long-term anticoagulation complicated by major subdural hematoma x2 on warfarin and episode of heparin associated thrombocytopenia following a hysterectomy necessitating the use of fondaparinux for chronic anticoagulation for both her antiphospholipid antibody syndrome and a prosthetic mechanical mitral valve. 5.  Status post tricuspid valvuloplasty and mechanical mitral valve replacement 6.  Chronic refractory atrial fibrillation status post cardioversion, ablation procedure, and ultimately placement of a permanent pacemaker 7.  Cardiomyopathy-progressive 8.  CKD 4-progressive.  She is adamant that she will not accept dialysis for further deterioration in her renal function. 9.  History of V. tach with amiodarone 10.  History of anaphylaxis  with IV vitamin K 11.  Poor vascular access.  She will need a PICC catheter.  Plan: She needs vascular access.  Interventional radiology to place a PICC catheter. 1.  IVIG 1 g/kg daily x2 doses.  Monitor daily CBCs. 2.  Hold Arixtra.  Begin Argatroban parenteral direct thrombin inhibitor per pharmacy consult.  Monitor  PTTs.  Half-life approximately the same as heparin 40-50 minutes hold for 5-6 hours prior to surgery and resume as soon as safe postop.

## 2017-04-09 NOTE — Progress Notes (Signed)
Patient was supposed to be direct admitted for large necrotic lesion on her arm per IMTS attending Dr. Beryle Beams. However wait for bed was too long so he advised her to go through the ED as it was felt unsafe for her to continue waiting without medical attention. Once patient is evaluated by ED, she will be admitted to the Internal Medicine Teaching Service.  Alphonzo Grieve, MD IMTS - PGY2 Pager [phone]

## 2017-04-09 NOTE — Progress Notes (Signed)
Results for KENDEL, PESNELL (MRN [id number]) as of 04/09/2017 19:43  Ref. Range 04/09/2017 18:12  Platelets Latest Ref Range: 150 - 400 K/uL 19 (LL)   Dr. Aggie Hacker notified of result. No new orders receive at this time, will continue to monitor pt.

## 2017-04-10 ENCOUNTER — Encounter: Payer: Medicare Other | Admitting: Oncology

## 2017-04-10 ENCOUNTER — Ambulatory Visit: Payer: Self-pay | Admitting: Plastic Surgery

## 2017-04-10 ENCOUNTER — Other Ambulatory Visit: Payer: Self-pay | Admitting: Plastic Surgery

## 2017-04-10 ENCOUNTER — Encounter (HOSPITAL_COMMUNITY): Payer: Self-pay | Admitting: Interventional Radiology

## 2017-04-10 DIAGNOSIS — S41102A Unspecified open wound of left upper arm, initial encounter: Secondary | ICD-10-CM

## 2017-04-10 DIAGNOSIS — Z952 Presence of prosthetic heart valve: Secondary | ICD-10-CM

## 2017-04-10 DIAGNOSIS — S41109A Unspecified open wound of unspecified upper arm, initial encounter: Secondary | ICD-10-CM

## 2017-04-10 DIAGNOSIS — S51802A Unspecified open wound of left forearm, initial encounter: Secondary | ICD-10-CM

## 2017-04-10 DIAGNOSIS — I5022 Chronic systolic (congestive) heart failure: Secondary | ICD-10-CM

## 2017-04-10 DIAGNOSIS — Z79899 Other long term (current) drug therapy: Secondary | ICD-10-CM

## 2017-04-10 DIAGNOSIS — X58XXXA Exposure to other specified factors, initial encounter: Secondary | ICD-10-CM

## 2017-04-10 DIAGNOSIS — Z862 Personal history of diseases of the blood and blood-forming organs and certain disorders involving the immune mechanism: Secondary | ICD-10-CM

## 2017-04-10 DIAGNOSIS — N184 Chronic kidney disease, stage 4 (severe): Secondary | ICD-10-CM

## 2017-04-10 DIAGNOSIS — Z950 Presence of cardiac pacemaker: Secondary | ICD-10-CM

## 2017-04-10 DIAGNOSIS — E039 Hypothyroidism, unspecified: Secondary | ICD-10-CM

## 2017-04-10 DIAGNOSIS — I5043 Acute on chronic combined systolic (congestive) and diastolic (congestive) heart failure: Secondary | ICD-10-CM

## 2017-04-10 DIAGNOSIS — Z8673 Personal history of transient ischemic attack (TIA), and cerebral infarction without residual deficits: Secondary | ICD-10-CM

## 2017-04-10 DIAGNOSIS — Z959 Presence of cardiac and vascular implant and graft, unspecified: Secondary | ICD-10-CM

## 2017-04-10 DIAGNOSIS — D7582 Heparin induced thrombocytopenia (HIT): Secondary | ICD-10-CM

## 2017-04-10 DIAGNOSIS — D631 Anemia in chronic kidney disease: Secondary | ICD-10-CM | POA: Diagnosis present

## 2017-04-10 DIAGNOSIS — Z79890 Hormone replacement therapy (postmenopausal): Secondary | ICD-10-CM

## 2017-04-10 LAB — BASIC METABOLIC PANEL
ANION GAP: 10 (ref 5–15)
BUN: 108 mg/dL — ABNORMAL HIGH (ref 6–20)
CHLORIDE: 100 mmol/L — AB (ref 101–111)
CO2: 21 mmol/L — AB (ref 22–32)
Calcium: 7.9 mg/dL — ABNORMAL LOW (ref 8.9–10.3)
Creatinine, Ser: 3.02 mg/dL — ABNORMAL HIGH (ref 0.44–1.00)
GFR calc non Af Amer: 15 mL/min — ABNORMAL LOW (ref >60)
GFR, EST AFRICAN AMERICAN: 17 mL/min — AB (ref >60)
GLUCOSE: 137 mg/dL — AB (ref 65–99)
POTASSIUM: 3.5 mmol/L (ref 3.5–5.1)
Sodium: 131 mmol/L — ABNORMAL LOW (ref 135–145)

## 2017-04-10 LAB — CBC
HEMATOCRIT: 23.1 % — AB (ref 36.0–46.0)
Hemoglobin: 7.3 g/dL — ABNORMAL LOW (ref 12.0–15.0)
MCH: 27.5 pg (ref 26.0–34.0)
MCHC: 31.6 g/dL (ref 30.0–36.0)
MCV: 87.2 fL (ref 78.0–100.0)
Platelets: 24 10*3/uL — CL (ref 150–400)
RBC: 2.65 MIL/uL — ABNORMAL LOW (ref 3.87–5.11)
RDW: 15.6 % — AB (ref 11.5–15.5)
WBC: 10 10*3/uL (ref 4.0–10.5)

## 2017-04-10 LAB — APTT
aPTT: 81 seconds — ABNORMAL HIGH (ref 24–36)
aPTT: 89 seconds — ABNORMAL HIGH (ref 24–36)

## 2017-04-10 LAB — PREPARE RBC (CROSSMATCH)

## 2017-04-10 MED ORDER — SODIUM CHLORIDE 0.9 % IV SOLN
Freq: Once | INTRAVENOUS | Status: AC
  Administered 2017-04-11: 09:00:00 via INTRAVENOUS

## 2017-04-10 MED ORDER — CHLORHEXIDINE GLUCONATE CLOTH 2 % EX PADS
6.0000 | MEDICATED_PAD | Freq: Once | CUTANEOUS | Status: AC
  Administered 2017-04-11: 6 via TOPICAL

## 2017-04-10 MED ORDER — FUROSEMIDE 10 MG/ML IJ SOLN
40.0000 mg | Freq: Once | INTRAMUSCULAR | Status: AC
  Administered 2017-04-10: 40 mg via INTRAVENOUS
  Filled 2017-04-10: qty 4

## 2017-04-10 MED ORDER — CHLORHEXIDINE GLUCONATE CLOTH 2 % EX PADS
6.0000 | MEDICATED_PAD | Freq: Once | CUTANEOUS | Status: AC
  Administered 2017-04-10: 6 via TOPICAL

## 2017-04-10 MED ORDER — SENNOSIDES-DOCUSATE SODIUM 8.6-50 MG PO TABS: 1.0000 | ORAL_TABLET | Freq: Every evening | ORAL | Status: DC | PRN

## 2017-04-10 MED ORDER — SENNOSIDES-DOCUSATE SODIUM 8.6-50 MG PO TABS
2.0000 | ORAL_TABLET | Freq: Every evening | ORAL | Status: DC | PRN
  Administered 2017-04-10: 2 via ORAL
  Filled 2017-04-10 (×2): qty 2

## 2017-04-10 MED ORDER — IMMUNE GLOBULIN (HUMAN) 20 GM/200ML IV SOLN
1.0000 g/kg | INTRAVENOUS | Status: AC
  Administered 2017-04-10: 70 g via INTRAVENOUS
  Filled 2017-04-10: qty 100

## 2017-04-10 MED ORDER — POTASSIUM CHLORIDE CRYS ER 20 MEQ PO TBCR
20.0000 meq | EXTENDED_RELEASE_TABLET | Freq: Once | ORAL | Status: AC
  Administered 2017-04-10: 20 meq via ORAL
  Filled 2017-04-10: qty 1

## 2017-04-10 NOTE — Anesthesia Preprocedure Evaluation (Addendum)
Anesthesia Evaluation  Patient identified by MRN, date of birth, ID band Patient awake    Reviewed: Allergy & Precautions, H&P , NPO status , Patient's Chart, lab work & pertinent test results  Airway Mallampati: II  TM Distance: >3 FB Neck ROM: Full    Dental no notable dental hx. (+) Teeth Intact, Dental Advisory Given   Pulmonary neg pulmonary ROS, former smoker,    Pulmonary exam normal breath sounds clear to auscultation       Cardiovascular Exercise Tolerance: Good hypertension, Pt. on medications and Pt. on home beta blockers +CHF  + dysrhythmias Atrial Fibrillation + pacemaker  Rhythm:Regular Rate:Normal     Neuro/Psych CVA negative psych ROS   GI/Hepatic negative GI ROS, Neg liver ROS,   Endo/Other  Hypothyroidism   Renal/GU Renal InsufficiencyRenal disease  negative genitourinary   Musculoskeletal  (+) Arthritis , Osteoarthritis,    Abdominal   Peds  Hematology negative hematology ROS (+) anemia ,   Anesthesia Other Findings   Reproductive/Obstetrics negative OB ROS                            Anesthesia Physical Anesthesia Plan  ASA: IV  Anesthesia Plan: MAC and Regional   Post-op Pain Management:    Induction: Intravenous  PONV Risk Score and Plan: 3 and Ondansetron, Dexamethasone, Midazolam and Propofol infusion  Airway Management Planned: Simple Face Mask  Additional Equipment:   Intra-op Plan:   Post-operative Plan:   Informed Consent: I have reviewed the patients History and Physical, chart, labs and discussed the procedure including the risks, benefits and alternatives for the proposed anesthesia with the patient or authorized representative who has indicated his/her understanding and acceptance.   Dental advisory given  Plan Discussed with: CRNA  Anesthesia Plan Comments:        Anesthesia Quick Evaluation

## 2017-04-10 NOTE — Progress Notes (Signed)
Hematology: Admission platelet count 19,000.  Down from 32,000 last week.  24,000 this morning.  She did receive her first dose of IVIG without incident.  Direct thrombin inhibitor infusion in progress. Hemoglobin has drifted down to 7.3.  White count 10,000.  Low-grade temp maximum 99.5. Minimal rales left lung base.  Regular cardiac rhythm with mechanical heart valve sound.  6-2/8 systolic murmur left sternal border.  Large wound left forearm I did not remove the dressing.  Wound inspected last night and unchanged.  Vascular exam remains normal.  Neurologic: She remains awake, alert, oriented, pupils equal round and reactive to light. Impression: 1.  Necrotic wound volar surface left arm Not currently on antibiotics.  Initial 10-day course of Keflex.  No antibiotics for the last 2 weeks.  Would culture if temp spikes and begin antibiotics. 2.  Acute on chronic ITP Hoping for a quick response to IVIG so that she is stable for surgical debridement.  Okay to move up second dose to begin this afternoon.  Will need to interrupt the DTI temporarily depending on pharmacy's recommendation although she does have a double lumen PICC so it might be possible to continue the DTI. 3.  Need for tight anticoagulation in view of antiphospholipid antibody syndrome and mechanical heart valve. 4.  High bleeding risk balancing anticoagulation needs and thrombotic risk in the face of a low platelet count. I would transfuse 1 unit of packed cells today.  Discussed with patient who is agreeable. 5.  Goals of care discussion. Dr. Kathyrn Drown does not want to go on dialysis for further deterioration in her renal function.  She does not want to be on a ventilator if her situation deteriorates to the point of an irreversible complication.  Otherwise, she wants [place] to try to aggressively treat any potentially reversible problem.  Murriel Hopper, MD, [place]  Hematology-Oncology/Internal Medicine [phone]

## 2017-04-10 NOTE — Progress Notes (Signed)
Internal Medicine Attending:   I saw and examined the patient. I reviewed the resident's note and I agree with the resident's findings and plan as documented in the resident's note, Dr Kathyrn Drown expressed her frustration with having to be in the hospital, is concerned about upcoming surgery.  Otherwise notes pain is controlled, no new events noted. On exam: tearful, left arm bandaged, left hand swollen no erythema, pulses palpable. Right arm ecchymosis. Lungs CTAB, abd +BS, soft and nontender. A/P Left arm wound -plan for debridement by Dr Marla Roe tomrrow, NPO after MN.  Antiphospholipid ab syndrome, mechanical MV, Permanent Afib, Hx CVA, Hx of HIT -Very high risk for thromboembolic event, appreciate Dr Azucena Freed ongoing assistance. -Cotninue argatroban perioperatively  Thrombocytopenia -IVIG per Dr Darnell Level orders, benadryl prior.  Chronic HFrEF - Dr Aundra Dubin following, given IV lasix x1.  CKD Stage 4 -Patient will not pursue HD if needed, will need close monitoring.

## 2017-04-10 NOTE — Progress Notes (Signed)
   Subjective:  Patient seen and examined. She was very upset this morning and is concerned she may not make it out of the hospital. She feels very powerless in the hospital and is just overall frustrated with having to be an inpatient. She denies pain, shortness of breath, chest pain, or discomfort. The patient was supposed to be NPO after midnight tonight but her diet order got changed last night and she had not received breakfast. She tolerated her first IVIG infusion well. No other physical complaints at this time. Pain well controlled on oxycodone.   Objective:  Vital signs in last 24 hours: Vitals:   04/09/17 2345 04/10/17 0000 04/10/17 0512 04/10/17 0945  BP: 132/68 (!) 129/59 131/64 132/64  Pulse:   89 88  Resp: 16 16 16 16   Temp: 99.3 F (37.4 C) 99.9 F (37.7 C) 99.2 F (37.3 C) 99.1 F (37.3 C)  TempSrc: Oral Oral Oral Oral  SpO2: 95% 97% 92% 93%  Weight:      Height:       General: Laying in bed comfortably, NAD, Multiple ecchymoses on her skin. HEENT: [place]/AT, EOMI, no scleral icterus, PERRL Cardiac: RRR, No R/M/G appreciated, mechanical valve sounds appreciated, pacemaker battery pack subcutaneously on left chest wall Pulm: normal effort, CTAB Abd: soft, non tender, non distended, BS normal Ext: Left arm wrapped with bandage, bandages appear clean and dry, significant soft tissue swelling including the dorsum of the hand and extending all the way up to mid arm. Radial pulse 2+; Symmetric ulnar pulse 2+ symmetric, sensation intact Neuro: alert and oriented X3, cranial nerves II-XII grossly intact  Assessment/Plan:  Active Problems:   Arm wound   S/P MVR (mitral valve replacement)   Anemia, chronic renal failure, stage 4 (severe) (HCC)  Left Arm wound Thrombocytopenia Antiphospholipid antibody syndrome The patient has a complex medical history and is presenting for debridement of her left forearm, and needs medical optimization. Patient with right antecubital PICC  line. Platelets on admission 19,000>>24,000 this AM. She received one infusion of IVIG thus far, and tolerated it well. -Second dose IVIG @ 2 PM -Benadryl q6 hours prior to IVIG  -Argatroban perioperatively for anticoagulation -Pain control with Oxycodone IR 5 mg q6 hours PRN -Cards on board per Dr. Marla Roe -CBC in AM   Normocytic anemia, symptomatic  Hgb 7.3 today. Patient will receive 1 unit of pRBCs after IVIG infusion.  -Type and Screen  -CBC in AM  HFrEF Recent Lexiscan myoview in 02/2017 showed estimated EF of 42%. Improved from prior ECHO in 11/2016 that showed an estimated EF 10-15%.  -Repeat ECHO this admission per Cards  -IV Lasix 40 mg x 1 today with transfusion  -Strict I/Os -Continue PO Lasix 20 mg oral daily -Can give IV Lasix 20 if necessary after IVIG infusions -Metoprolol 50 mg BID -Hydralazine 12.5 mg TID -Imdur 30 mg QD   Chronic, refractory atrial fibrillation  Has previously failed multiple DCCV and ablation procedures. Cannot get amiodarone as she has had torsades in the past.  -Metoprolol 50 mg BID -Transition to parenteral Argatroban for anticoagulation   CKD Patients creatinine has slowly been increasing over the past several months, 1.73 in 11/2016. Most recent Cr 3.13, with GFR of 14, BUN 113. She does not want to be placed on dialysis.  -BMET in the AM  Hypothyroidism -Continue Synthroid 88 mcg daily   Dispo: Anticipated discharge in approximately 2-3 days.   Melanee Spry, MD 04/10/2017, 1:05 PM Pager: [phone]

## 2017-04-10 NOTE — Progress Notes (Signed)
ANTICOAGULATION CONSULT NOTE - Pharmacy Consult for Argatroban Indication: MVR and antiphospholipid antibody syndrome   Allergies  Allergen Reactions  . Amiodarone Hcl Other (See Comments)    "torsades; v tach"  . Coumadin [Warfarin Sodium] Other (See Comments)    "bled into my head"  . 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 Vitamin K Anaphylaxis    IV only allergy  . Iodinated Diagnostic Agents Itching    "over 35 years ago" (03/28/2012)  . Metrizamide Itching    "over 35 years ago" (03/28/2012)    Labs:  Recent Labs  04/09/17 1812 04/10/17 0255 04/10/17 0800  HGB 8.0* 7.3*  --   HCT 25.1* 23.1*  --   PLT 19* 24*  --   APTT 49* 81* 89*  LABPROT 15.1  --   --   INR 1.20  --   --   HEPRLOWMOCWT 0.27  --   --   CREATININE 3.13* 3.02*  --     Estimated Creatinine Clearance: 16.6 mL/min (A) (by C-G formula based on SCr of 3.02 mg/dL (H)).   Assessment: 71 year old female with h/o MVR, Arixtra on hold while awaiting surgery, for argatroban.  Argatroban was held this afternoon for pt to receive IVIG. IVIG just finished, will resume argatroban.   Goal of Therapy:  PTT = 50 to 90 seconds Monitor platelets by anticoagulation protocol: Yes   Plan: -Resume argatroban at 1 mcg/kg/min -Check aptt in 4 hr -Discussed with RN   Harvel Quale 04/10/2017 6:57 PM

## 2017-04-10 NOTE — Consult Note (Addendum)
Advanced Heart Failure Team Consult Note   Primary Physician: Dr Inda Merlin.  Primary Cardiologist:  Dr Stanford Breed HF MD: Dr Aundra Dubin   Reason for Consultation: Heart Failure   HPI:    [name] is seen today for evaluation of heart failure at the request of Dr Marla Roe.   [name] is a 71 year old with a history of rheumatic heart disease,  Mechanical MV and TV repair, chronic systolic heart failure, NICM, St Jude CRT-D, CVA,permanenet A Fib, antiphospholipid antibody syndrome, subdural hematoma x2 while on coumadin, ITP, and hypothyroidism.   In August she had drop in EF from 35-40% --> to 10-15%. She was referred to Dr Aundra Dubin for an evaluation. She was sent for lexiscan to further evaluate. This was completed on 03/06/17 and EF was 42% with similar fixed defect. When she completed the Christine she reported a skin tear when the tape/IV was removed. Since that time the L forearm has worsened with increased exudate, necrosis, and erythema. Initially treated with steroids and antibiotics. She was also seen by General Surgery x2. She was instructed to use silvadene. On October 12th Dr Aundra Dubin evaluated HF which was stable but LUE was significantly worse. Due to concern for limb loss she was referred to Dr Marla Roe for an evaluation.   Admitted for LUE necrosis with optimization prior to surgery. Receiving IVIG x2. Pertinent admission labs: platelets 19,000, WBC 6.9, Creatinine 3.3. PICC line plaed.    Denies CP/SOB. Frustrated about breakfast.   .    Review of Systems: [y] = yes, [ ]  = no   General: Weight gain [ ] ; Weight loss [ ] ; Anorexia [ ] ; Fatigue [Y ]; Fever [ ] ; Chills [ ] ; Weakness [ ]   Cardiac: Chest pain/pressure [ ] ; Resting SOB [ ] ; Exertional SOB [ ] ; Orthopnea [ ] ; Pedal Edema [ ] ; Palpitations [ ] ; Syncope [ ] ; Presyncope [ ] ; Paroxysmal nocturnal dyspnea[ ]   Pulmonary: Cough [ ] ; Wheezing[ ] ; Hemoptysis[ ] ; Sputum [ ] ; Snoring [ ]   GI: Vomiting[ ] ; Dysphagia[ ] ;  Melena[ ] ; Hematochezia [ ] ; Heartburn[ ] ; Abdominal pain [ ] ; Constipation [ ] ; Diarrhea [ ] ; BRBPR [ ]   GU: Hematuria[ ] ; Dysuria [ ] ; Nocturia[ ]   Vascular: Pain in legs with walking [ ] ; Pain in feet with lying flat [ ] ; Non-healing sores [ ] ; Stroke [Y ]; TIA [ ] ; Slurred speech [ ] ;  Neuro: Headaches[ ] ; Vertigo[ ] ; Seizures[ ] ; Paresthesias[ ] ;Blurred vision [ ] ; Diplopia [ ] ; Vision changes [ ]   Ortho/Skin: Arthritis [ ] ; Joint pain [Y ]; Muscle pain [ ] ; Joint swelling [ ] ; Back Pain [ ] ; Rash [ ]   Psych: Depression[ ] ; Anxiety[ ]   Heme: Bleeding problems [ ] ; Clotting disorders [ ] ; Anemia [ ]   Endocrine: Diabetes [ ] ; Thyroid dysfunction[Y ] Skin: LUE edema- necosis  Home Medications Prior to Admission medications   Medication Sig Start Date End Date Taking? Authorizing Provider  acetaminophen (TYLENOL) 650 MG CR tablet Take 1,300 mg by mouth 3 (three) times daily as needed for pain.     [provider]  AMOXICILLIN PO Take 4 capsules by mouth as directed. ONLY WHEN VISITING DENTIST    [provider]  Calcium Citrate (CITRACAL PO) Take 1 tablet by mouth daily.    [provider]  Cholecalciferol (VITAMIN D) 2000 UNITS tablet Take 2,000 Units by mouth daily.      [provider]  Coenzyme Q-10 100 MG capsule Take 100  mg by mouth daily.    [provider]  eltrombopag (PROMACTA) 50 MG tablet Take 50 mg by mouth daily. Take on an empty stomach 1 hour before a meal or 2 hours after    [provider]  Estradiol Acetate ([place]) 0.05 MG/24HR RING Place 1 each vaginally as directed.     [provider]  FLUTICASONE PROPIONATE, NASAL, NA Place 2 sprays into both nostrils daily.    [provider]  fondaparinux (ARIXTRA) 2.5 MG/0.5ML SOLN injection INJECT 0.5ML INTO THE SKIN EVERY OTHER DAY 12/01/16   Annia Belt, MD  furosemide (LASIX) 20 MG tablet Take 1 tablet (20 mg total) by mouth daily. 03/13/17   Larey Dresser, MD  hydrALAZINE (APRESOLINE) 25 MG tablet Take 0.5 tablets (12.5 mg total) by mouth 3 (three) times daily. 03/05/17 06/03/17  Larey Dresser, MD  isosorbide mononitrate (IMDUR) 30 MG 24 hr tablet Take 1 tablet (30 mg total) by mouth daily. 03/05/17 06/03/17  Larey Dresser, MD  levothyroxine (SYNTHROID, LEVOTHROID) 88 MCG tablet Take 88 mcg by mouth daily before breakfast.    [provider]  methylPREDNISolone (MEDROL) 4 MG TBPK tablet Medrol dose pak Use as directed 03/30/17   Annia Belt, MD  metoprolol succinate (TOPROL-XL) 50 MG 24 hr tablet TAKE ONE (1) TABLET BY MOUTH TWO (2) TIMES DAILY TAKE WITH OR IMMEDIATELY FOLLOWING A MEAL 02/28/17   Crenshaw, Denice Bors, MD  Misc Natural Products (TART CHERRY ADVANCED PO) Take 2 tablets by mouth daily.    [provider]  Omega-3 Fatty Acids (OMEGA-3 FISH OIL) 500 MG CAPS Take 1 capsule by mouth daily.    [provider]  oxyCODONE (ROXICODONE) 5 MG immediate release tablet 1 or 2 tablets every 6 hours as needed for pain 04/04/17   Annia Belt, MD  traMADol (ULTRAM) 50 MG tablet Take 1 tablet (50 mg total) by mouth every 6 (six) hours as needed for severe pain. 03/30/17   Annia Belt, MD  valACYclovir (VALTREX) 1000 MG tablet Take 1,000 mg by mouth 2 (two) times daily. For a total of 3 doses for labial herpes prn only    [provider]  zolpidem (AMBIEN) 5 MG tablet Take 1 tablet (5 mg total) by mouth at bedtime as needed for sleep. 03/30/17   Annia Belt, MD    Past Medical History: Past Medical History:  Diagnosis Date  . Acute ITP ([place]) 10/09/2016  . Anemia   . Antiphospholipid antibody syndrome ([place])   . Arthritis    "may have some" (03/28/2012)  . Atrial flutter ([place]    atypical right atrial flutter s/p ablation at [facility] by Dr Clyda Hurdle  . Cerebrovascular accident [facility]) ?1990; 1993; 2007   residual "can't use my right hand; left visual field cut" (03/28/2012)  . CHF  (congestive heart failure) ([place])   . 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 Chronic kidney disease    "as a result of my heart failing" (03/28/2012)  . Fine motor impairment    "fingers right hand" (03/28/2012)  . Heart murmur   . 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 History of blood transfusion    "lots; most in 2007 w/MVR OR"  . History of Hashimoto thyroiditis   . HLD (hyperlipidemia)   . HTN (hypertension)   . Hypothyroidism   . Lymphedema of left arm 03/30/2017  . 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 Pacemaker   .  Rheumatic heart disease 1995   post percutaneous valvuloplasty at [facility] with subsequent mechanical mitral valve replacement and tricuspid annuloplasty at [facility]  . Right leg swelling 10/06/2013   Asymmetric; no calf pain, venous doppler negative for DVT 10/06/13  . Subdural hematoma ([place]) 2009   on coumadin    Past Surgical History: Past Surgical History:  Procedure Laterality Date  . AMPUTATION     left atrial appendage amputated with MVR  . ATRIAL ABLATION SURGERY     CTI and R atriostomy scar flutter ablation at [facility] 8/12,  repeat  ablation at [facility]  9/12  . BI-VENTRICULAR PACEMAKER INSERTION N/A 03/28/2012   Procedure: BI-VENTRICULAR PACEMAKER INSERTION (CRT-P);  Surgeon: Evans Lance, MD;  Location: [facility] CATH LAB;  Service: Cardiovascular;  Laterality: N/A;  . biventricular pacemaker placement  03/28/2012   SJM Anthem implanted by Dr Lovena Le  . CARDIAC ELECTROPHYSIOLOGY [place] AND ABLATION  2012   "2 @ [facility]" (03/28/2012  . CARDIAC VALVE REPLACEMENT    . CARDIOVERSION  2010-2011   "3 at [facility]" (03/28/2012)  . CHOLECYSTECTOMY    . CRANIOTOMY  2008   for SDH  . IR FLUORO GUIDE CV MIDLINE PICC RIGHT  04/09/2017  . IR US GUIDE VASC ACCESS RIGHT  04/09/2017  . MITRAL VALVE REPLACEMENT  2007   with tricuspid annuloplasty  . [place] with subsequent mechanical  mitral valve replacement and tricuspid annuloplasty at [facility]  . TONSILLECTOMY    . TONSILLECTOMY     "when I was a child"  . TOTAL ABDOMINAL HYSTERECTOMY  ?1995   TAH; BSO    Family History: Family History  Problem Relation Age of Onset  . Heart disease Father        late 64's early 80's-CABG  . Dementia Father   . Diabetes Father   . Melanoma Father   . Cancer - Colon Mother   . Cancer - Lung Brother     Social History: Social History   Social History  . Marital status: Married    Spouse name: Alease Medina   . Number of children: 0  . Years of education: college   Occupational History  . PHYSICIAN Retired    retired   Social History Main Topics  . Smoking status: Former Smoker    Packs/day: 1.00    Years: 5.00    Types: Cigarettes    Quit date: 04/09/1992  . Smokeless tobacco: Never Used     Comment: 03/28/2012 "smoked in the 1970's"  . Alcohol use 0.0 oz/week     Comment: wine rarely.  . Drug use: No  . Sexual activity: Yes    Partners: Male    Birth control/ protection: None   Other Topics Concern  . 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 Lives at home with her husband.  Quit smoking 25 years ago and quit alcohol 2 years ago.   Denies alcohol, caffeine,  and illicit drug use.      Allergies:  Allergies  Allergen Reactions  . Amiodarone Hcl Other (See Comments)    "torsades; v tach"  . Coumadin [Warfarin Sodium] Other (See Comments)    "bled into my head"  . 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 Vitamin K Anaphylaxis    IV only allergy  . Iodinated Diagnostic Agents Itching    "over 35 years ago" (03/28/2012)  .  Metrizamide Itching    "over 35 years ago" (03/28/2012)    Objective:    Vital Signs:   Temp:  [98.7 F (37.1 C)-99.9 F (37.7 C)] 99.2 F (37.3 C) (10/16 0512) Pulse Rate:  [77-89] 89 (10/16 0512) Resp:  [16-18] 16 (10/16 0512) BP: (122-151)/(49-68) 131/64 (10/16  0512) SpO2:  [92 %-97 %] 92 % (10/16 0512) Weight:  [160 lb (72.6 kg)] 160 lb (72.6 kg) (10/15 2040)    Weight change: Filed Weights   04/09/17 2040  Weight: 160 lb (72.6 kg)    Intake/Output:   Intake/Output Summary (Last 24 hours) at 04/10/17 1033 Last data filed at 04/10/17 0900  Gross per 24 hour  Intake                0 ml  Output             1100 ml  Net            -1100 ml      Physical Exam    General:  Tearful. Appears chronically ill. No resp difficulty. Sitting on the side of the bed.  HEENT: normal Neck: supple. JVP 10-12. Carotids 2+ bilat; no bruits. No lymphadenopathy or thyromegaly appreciated. Cor: PMI nondisplaced. Irregular rate & rhythm. Mechanical S1.  No rubs, gallops or murmurs. Lungs: clear Abdomen: soft, nontender, nondistended. No hepatosplenomegaly. No bruits or masses. Good bowel sounds. Extremities: no cyanosis, clubbing, rash, edema. LUE dressing 3_ edema.  RUE PICC  Neuro: alert & orientedx3, cranial nerves grossly intact. moves all 4 extremities w/o difficulty. Affect pleasant   Telemetry  A sensed -V paced 90s    EKG    NA  Labs   Basic Metabolic Panel:  Recent Labs Lab 04/04/17 1030 04/09/17 1812 04/10/17 0255  NA 129* 134* 131*  K 4.8 3.6 3.5  CL 98* 101 100*  CO2 19* 22 21*  GLUCOSE 149* 165* 137*  BUN 107* 113* 108*  CREATININE 3.35* 3.13* 3.02*  CALCIUM 8.9 8.5* 7.9*    Liver Function Tests:  Recent Labs Lab 04/09/17 1812  AST 20  ALT 14  ALKPHOS 96  BILITOT <0.1*  PROT 6.2*  ALBUMIN 2.9*   No results for input(s): LIPASE, AMYLASE in the last 168 hours. No results for input(s): AMMONIA in the last 168 hours.  CBC:  Recent Labs Lab 04/04/17 1030 04/09/17 1812 04/10/17 0255  WBC 12.6* 8.9 10.0  NEUTROABS 10.3*  --   --   HGB 8.5* 8.0* 7.3*  HCT 27.2* 25.1* 23.1*  MCV 86.9 86.3 87.2  PLT 32* 19* 24*    Cardiac Enzymes: No results for input(s): CKTOTAL, CKMB, CKMBINDEX, TROPONINI in the last  168 hours.  BNP: BNP (last 3 results) No results for input(s): BNP in the last 8760 hours.  ProBNP (last 3 results) No results for input(s): PROBNP in the last 8760 hours.   CBG: No results for input(s): GLUCAP in the last 168 hours.  Coagulation Studies:  Recent Labs  04/09/17 1812  LABPROT 15.1  INR 1.20     Imaging   Ir US Guide Vasc Access Right  Result Date: 04/10/2017 INDICATION: Left arm infection EXAM: RIGHT UPPER EXTREMITY PICC LINE PLACEMENT WITH ULTRASOUND AND FLUOROSCOPIC GUIDANCE MEDICATIONS: None ANESTHESIA/SEDATION: None FLUOROSCOPY TIME:  Fluoroscopy Time:  minutes 12 seconds (1 mGy). COMPLICATIONS: None immediate. PROCEDURE: The patient was advised of the possible risks and complications and agreed to undergo the procedure. The patient was then brought to the angiographic suite for  the procedure. The right arm was prepped with chlorhexidine, draped in the usual sterile fashion using maximum barrier technique (cap and mask, sterile gown, sterile gloves, large sterile sheet, hand hygiene and cutaneous antiseptic). Local anesthesia was attained by infiltration with 1% lidocaine. Ultrasound demonstrated patency of the basilic vein, and this was documented with an image. Under real-time ultrasound guidance, this vein was accessed with a 21 gauge micropuncture needle and image documentation was performed. The needle was exchanged over a guidewire for a peel-away sheath through which a 39 cm 5 [place] double lumen power injectable PICC was advanced, and positioned with its tip at the lower SVC/right atrial junction. Fluoroscopy during the procedure and fluoro spot radiograph confirms appropriate catheter position. The catheter was flushed, secured to the skin with Prolene sutures, and covered with a sterile dressing. IMPRESSION: Successful placement of a right arm PICC with sonographic and fluoroscopic guidance. The catheter is ready for use. Electronically Signed   By: Marybelle Killings M.D.   On: 04/10/2017 09:27   Ir Fluoro Guide Cv Midline Picc Right  Result Date: 04/10/2017 INDICATION: Left arm infection EXAM: RIGHT UPPER EXTREMITY PICC LINE PLACEMENT WITH ULTRASOUND AND FLUOROSCOPIC GUIDANCE MEDICATIONS: None ANESTHESIA/SEDATION: None FLUOROSCOPY TIME:  Fluoroscopy Time:  minutes 12 seconds (1 mGy). COMPLICATIONS: None immediate. PROCEDURE: The patient was advised of the possible risks and complications and agreed to undergo the procedure. The patient was then brought to the angiographic suite for the procedure. The right arm was prepped with chlorhexidine, draped in the usual sterile fashion using maximum barrier technique (cap and mask, sterile gown, sterile gloves, large sterile sheet, hand hygiene and cutaneous antiseptic). Local anesthesia was attained by infiltration with 1% lidocaine. Ultrasound demonstrated patency of the basilic vein, and this was documented with an image. Under real-time ultrasound guidance, this vein was accessed with a 21 gauge micropuncture needle and image documentation was performed. The needle was exchanged over a guidewire for a peel-away sheath through which a 39 cm 5 [place] double lumen power injectable PICC was advanced, and positioned with its tip at the lower SVC/right atrial junction. Fluoroscopy during the procedure and fluoro spot radiograph confirms appropriate catheter position. The catheter was flushed, secured to the skin with Prolene sutures, and covered with a sterile dressing. IMPRESSION: Successful placement of a right arm PICC with sonographic and fluoroscopic guidance. The catheter is ready for use. Electronically Signed   By: Marybelle Killings M.D.   On: 04/10/2017 09:27      Medications:     Current Medications: . [START ON 04/11/2017] eltrombopag  50 mg Oral Once  . furosemide  20 mg Oral Daily  . hydrALAZINE  12.5 mg Oral TID  . Immune Globulin 10%  1 g/kg Intravenous Q24 Hr x 2  . isosorbide mononitrate  30 mg Oral  Daily  . levothyroxine  88 mcg Oral QAC breakfast  . metoprolol succinate  50 mg Oral BID WC     Infusions: . argatroban 1 mcg/kg/min (04/10/17 0720)       Patient Profile   Cassandra Mounsey is a 71 year old with a history of rheumatic heart disease,  Mechanical MV and TV repair, chronic systolic heart failure, NICM, St Jude CRT-D, CVA,permanenet A Fib, antiphospholipid antibody syndrome, subdural hematoma x2 while on coumadin, ITP, and hypothyroidism admitted optimization by Dr Beryle Beams. Received IVIG x2. Pending LUE debridement tomorrow.    Assessment/Plan   1. LUE - Forearm. Extensive necrosis noted. Referred to Dr Marla Roe on 10/12. Plan for  debridement tomorrow by Dr Marla Roe. Concern for limb loss.  2. ITP- Per Dr Beryle Beams- receiving IVIG x2. On Argatroban.  3. Rheumatic Heart Disease: S/P mechanical MVR and TV repair. Currently off fondaparinux. No coumadin with subdural hematoma x2.  Not on asa with low platelets.  4. Chronic Systolic Heart Failure- NICM. St Jude CRT-D. Lexiscan EF 42%  - Some volume overload though creatinine is up.  Will give Lasix 40 mg IV x 1 with pRBCs today.   - Continue current dose of Toporol XL 50 mg twice a day - Continue hydralazine/imdur . No ace/spiro with CKD.  -From HF perspective she is stable. 5. CKD Stage IV- Creatinine 3.0  6.  H/O CVA   Length of Stay: 1  Amy Clegg, NP  04/10/2017, 10:33 AM  Advanced Heart Failure Team Pager [phone] (M-F; 7a - 4p)  Please contact [facility] for night-coverage after hours (4p -7a ) and weekends on [email or web address]  Patient seen with NP, agree with the about.    1. Rheumatic heart disease: S/p MV replacement (mechanical) and TV repair.  Echo in 8/18 showed normal mechanical mitral valve. There was moderate TR.  She is anticoagulated with fondaparinux chronically given subdural hematomas x 2 on coumadin.  She is not on aspirin given low platelets.   - Currently off fondaparinux and on  argatroban as bridge prior to surgery.  2. Atrial fibrillation: Permanent.  Patient has failed DCCV and amiodarone caused torsades.  She generally BiV paces around 95% of the time.  Rate control is adequate on Toprol XL, continue.  3. CKD:  Stage 3-4.  Creatinine has risen to > 3 recently, no definite cause identified.  She is on Lasix 20 mg daily at home and today has JVD on exam.  - Will give IV Lasix carefully with PRBCs today.  - She would not want HD.  4. Chronic systolic CHF: Suspected nonischemic cardiomyopathy.  EF 35-40% on 6/15 echo, decreased to 10-15% with moderately decreased RV systolic function on 0/62 echo.  She has a Physiological scientist.  Interestingly, EF was 42% on 9/18 Cardiolite.  ?Fall in EF in 8/18 due to stress cardiomyopathy given the issues she was going through at that time?  She does appear volume overloaded on exam.   - Repeat echo this admission to confirm rise in EF that was noted on Cardiolite.    - Getting PRBCs today, will give Lasix 40 mg IV x 1 today.  Strict I/Os.     - Continue current Toprol XL.  - She is not on ACEI/ARB/ARNI/spironolactone due to CKD, creatinine rising. - Continue hydralazine 12.5 mg tid + Imdur 30 mg daily.  BP stable.  - She had a discussion with Dr. Rayann Heman and decided against defibrillator (has CRT-P device).  5. APLAS: Stable. Dr Beryle Beams following.  6. ITP: Platelets 24K today.  She is on Promacta.  She is getting IVIG per Dr. Beryle Beams, will have a dose today.  7. H/o CVA: Mild residual left-sided weakness.  8. Left forearm wound: This has been present for a month and has been worsening.  This developed from a skin tear she had during her stress test.  She has been seen by Dr. Marla Roe and needs debridement of the arm tomorrow.  9. Anemia: 1 unit PRBCs today per Dr. Beryle Beams. Will give IV Lasix with this.   Loralie Champagne 04/10/2017 12:28 PM

## 2017-04-10 NOTE — Progress Notes (Signed)
ANTICOAGULATION CONSULT NOTE - Pharmacy Consult for Argatroban Indication: MVR and antiphospholipid antibody syndrome   Allergies  Allergen Reactions  . Amiodarone Hcl Other (See Comments)    "torsades; v tach"  . Coumadin [Warfarin Sodium] Other (See Comments)    "bled into my head"  . 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 Vitamin K Anaphylaxis    IV only allergy  . Iodinated Diagnostic Agents Itching    "over 35 years ago" (03/28/2012)  . Metrizamide Itching    "over 35 years ago" (03/28/2012)    Labs:  Recent Labs  04/09/17 1812 04/10/17 0255  HGB 8.0* 7.3*  HCT 25.1* 23.1*  PLT 19* 24*  APTT 49* 81*  LABPROT 15.1  --   INR 1.20  --   HEPRLOWMOCWT 0.27  --   CREATININE 3.13*  --     Estimated Creatinine Clearance: 16 mL/min (A) (by C-G formula based on SCr of 3.13 mg/dL (H)).   Assessment: 71 year old female with h/o MVR, Arixtra on hold while awaiting surgery, for argatroban  Goal of Therapy:  PTT = 50 to 90 seconds Monitor platelets by anticoagulation protocol: Yes   Plan:  .Continue argatroban at current rate Recheck aPTT at 0800 to verify  Phillis Knack, PharmD, BCPS 04/10/2017,3:56 AM

## 2017-04-10 NOTE — Progress Notes (Addendum)
ANTICOAGULATION CONSULT NOTE - Pharmacy Consult for Argatroban Indication: MVR and antiphospholipid antibody syndrome   Allergies  Allergen Reactions  . Amiodarone Hcl Other (See Comments)    "torsades; v tach"  . Coumadin [Warfarin Sodium] Other (See Comments)    "bled into my head"  . 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 Vitamin K Anaphylaxis    IV only allergy  . Iodinated Diagnostic Agents Itching    "over 35 years ago" (03/28/2012)  . Metrizamide Itching    "over 35 years ago" (03/28/2012)    Labs:  Recent Labs  04/09/17 1812 04/10/17 0255 04/10/17 0800  HGB 8.0* 7.3*  --   HCT 25.1* 23.1*  --   PLT 19* 24*  --   APTT 49* 81* 89*  LABPROT 15.1  --   --   INR 1.20  --   --   HEPRLOWMOCWT 0.27  --   --   CREATININE 3.13* 3.02*  --     Estimated Creatinine Clearance: 16.6 mL/min (A) (by C-G formula based on SCr of 3.02 mg/dL (H)).   Assessment: 71 year old female with h/o MVR, Arixtra on hold while awaiting surgery, for argatroban  First two aPTT's therapeutic but at higher end of desired range. Hgb 7.3, PLTc 24.   Goal of Therapy:  PTT = 50 to 90 seconds Monitor platelets by anticoagulation protocol: Yes   Plan:  1. Continue argatroban 1 mcg/kg/min or 4.3 ml/hr for now 2. Argatroban infusion to be stopped while IVIG infusion occurs this afternoon; will follow up on IVIG infusion rate/time and plan to resume argatroban when IVIG infusion complete  3. Daily aPTT  Vincenza Hews, PharmD, BCPS 04/10/2017, 11:20 AM

## 2017-04-11 ENCOUNTER — Encounter (HOSPITAL_COMMUNITY): Payer: Self-pay | Admitting: Plastic Surgery

## 2017-04-11 ENCOUNTER — Inpatient Hospital Stay (HOSPITAL_COMMUNITY): Payer: Medicare Other | Admitting: Anesthesiology

## 2017-04-11 ENCOUNTER — Encounter (HOSPITAL_COMMUNITY)
Admission: AD | Disposition: A | Discharge status: Home Health | Payer: Self-pay | Source: Ambulatory Visit | Attending: Internal Medicine

## 2017-04-11 ENCOUNTER — Inpatient Hospital Stay (HOSPITAL_COMMUNITY): Payer: Medicare Other

## 2017-04-11 ENCOUNTER — Ambulatory Visit (HOSPITAL_COMMUNITY): Admission: RE | Admit: 2017-04-11 | Payer: Medicare Other | Source: Ambulatory Visit | Admitting: Plastic Surgery

## 2017-04-11 DIAGNOSIS — Z862 Personal history of diseases of the blood and blood-forming organs and certain disorders involving the immune mechanism: Secondary | ICD-10-CM

## 2017-04-11 DIAGNOSIS — S41102A Unspecified open wound of left upper arm, initial encounter: Secondary | ICD-10-CM

## 2017-04-11 DIAGNOSIS — N189 Chronic kidney disease, unspecified: Secondary | ICD-10-CM

## 2017-04-11 DIAGNOSIS — Z978 Presence of other specified devices: Secondary | ICD-10-CM

## 2017-04-11 DIAGNOSIS — D6859 Other primary thrombophilia: Secondary | ICD-10-CM

## 2017-04-11 DIAGNOSIS — D6861 Antiphospholipid syndrome: Secondary | ICD-10-CM

## 2017-04-11 DIAGNOSIS — I5020 Unspecified systolic (congestive) heart failure: Secondary | ICD-10-CM

## 2017-04-11 DIAGNOSIS — I351 Nonrheumatic aortic (valve) insufficiency: Secondary | ICD-10-CM

## 2017-04-11 HISTORY — PX: APPLICATION OF WOUND VAC: SHX5189

## 2017-04-11 HISTORY — PX: I & D EXTREMITY: SHX5045

## 2017-04-11 LAB — TYPE AND SCREEN
ABO/RH(D): O NEG
ANTIBODY SCREEN: NEGATIVE
UNIT DIVISION: 0

## 2017-04-11 LAB — BPAM RBC
Blood Product Expiration Date: 201810212359
ISSUE DATE / TIME: 201810162235
Unit Type and Rh: 9500

## 2017-04-11 LAB — CBC
HCT: 27.9 % — ABNORMAL LOW (ref 36.0–46.0)
Hemoglobin: 8.7 g/dL — ABNORMAL LOW (ref 12.0–15.0)
MCH: 26.9 pg (ref 26.0–34.0)
MCHC: 31.2 g/dL (ref 30.0–36.0)
MCV: 86.4 fL (ref 78.0–100.0)
Platelets: 52 10*3/uL — ABNORMAL LOW (ref 150–400)
RBC: 3.23 MIL/uL — ABNORMAL LOW (ref 3.87–5.11)
RDW: 15.4 % (ref 11.5–15.5)
WBC: 7 10*3/uL (ref 4.0–10.5)

## 2017-04-11 LAB — BASIC METABOLIC PANEL
Anion gap: 10 (ref 5–15)
BUN: 108 mg/dL — AB (ref 6–20)
CHLORIDE: 98 mmol/L — AB (ref 101–111)
CO2: 21 mmol/L — ABNORMAL LOW (ref 22–32)
CREATININE: 2.78 mg/dL — AB (ref 0.44–1.00)
Calcium: 8 mg/dL — ABNORMAL LOW (ref 8.9–10.3)
GFR calc Af Amer: 19 mL/min — ABNORMAL LOW (ref >60)
GFR calc non Af Amer: 16 mL/min — ABNORMAL LOW (ref >60)
Glucose, Bld: 117 mg/dL — ABNORMAL HIGH (ref 65–99)
Potassium: 3.7 mmol/L (ref 3.5–5.1)
SODIUM: 129 mmol/L — AB (ref 135–145)

## 2017-04-11 LAB — ECHOCARDIOGRAM COMPLETE
AVLVOTPG: 5 mmHg
Area-P 1/2: 4.15 cm2
CHL CUP DOP CALC LVOT VTI: 20.4 cm
CHL CUP LVOT MV VTI INDEX: 0.64 cm2/m2
CHL CUP MV DEC (S): 204
CHL CUP MV M VEL: 58.3
CHL CUP RV SYS PRESS: 44 mmHg
E decel time: 204 msec
FS: 9 % — AB (ref 28–44)
Height: 63 in
IV/PV OW: 0.67
LA ID, A-P, ES: 52 mm
LA vol A4C: 79.8 ml
LADIAMINDEX: 2.85 cm/m2
LAVOL: 92.8 mL
LAVOLIN: 50.9 mL/m2
LEFT ATRIUM END SYS DIAM: 52 mm
LV dias vol index: 56 mL/m2
LV dias vol: 103 mL (ref 46–106)
LV sys vol index: 44 mL/m2
LV sys vol: 80 mL — AB (ref 14–42)
LVOT MV VTI: 1.17
LVOT area: 2.01 cm2
LVOTD: 16 mm
LVOTPV: 115 cm/s
LVOTSV: 41 mL
Lateral S' vel: 7.56 cm/s
MVANNULUSVTI: 35 cm
MVSPHT: 53 ms
Mean grad: 2 mmHg
P 1/2 time: 490 ms
PW: 12 mm — AB (ref 0.6–1.1)
Reg peak vel: 320 cm/s
Simpson's disk: 22
Stroke v: 23 ml
TAPSE: 14.8 mm
TRMAXVEL: 320 cm/s
Weight: 2576 oz

## 2017-04-11 LAB — SURGICAL PCR SCREEN
MRSA, PCR: NEGATIVE
Staphylococcus aureus: NEGATIVE

## 2017-04-11 LAB — MAGNESIUM: MAGNESIUM: 2.3 mg/dL (ref 1.7–2.4)

## 2017-04-11 LAB — APTT
APTT: 91 s — AB (ref 24–36)
APTT: 85 s — AB (ref 24–36)
APTT: 81 s — AB (ref 24–36)

## 2017-04-11 SURGERY — IRRIGATION AND DEBRIDEMENT EXTREMITY
Anesthesia: Monitor Anesthesia Care | Laterality: Left

## 2017-04-11 MED ORDER — MAGNESIUM HYDROXIDE 400 MG/5ML PO SUSP
15.0000 mL | Freq: Once | ORAL | Status: AC
  Administered 2017-04-11: 15 mL via ORAL
  Filled 2017-04-11: qty 30

## 2017-04-11 MED ORDER — ROPIVACAINE HCL 7.5 MG/ML IJ SOLN
INTRAMUSCULAR | Status: DC | PRN
  Administered 2017-04-11: 20 mL via PERINEURAL

## 2017-04-11 MED ORDER — FENTANYL CITRATE (PF) 100 MCG/2ML IJ SOLN: 25.0000 ug | INTRAMUSCULAR | Status: DC | PRN

## 2017-04-11 MED ORDER — DEXTROSE 5 % IV SOLN: 2.0000 g | INTRAVENOUS | Status: AC

## 2017-04-11 MED ORDER — 0.9 % SODIUM CHLORIDE (POUR BTL) OPTIME
TOPICAL | Status: DC | PRN
  Administered 2017-04-11: 1000 mL

## 2017-04-11 MED ORDER — HYDROMORPHONE HCL 1 MG/ML IJ SOLN: 0.5000 mg | INTRAMUSCULAR | Status: DC | PRN

## 2017-04-11 MED ORDER — PROPOFOL 10 MG/ML IV BOLUS
INTRAVENOUS | Status: DC | PRN
  Administered 2017-04-11: 20 mg via INTRAVENOUS

## 2017-04-11 MED ORDER — ARGATROBAN 50 MG/50ML IV SOLN
0.8000 ug/kg/min | INTRAVENOUS | Status: DC
  Administered 2017-04-11: 0.8 ug/kg/min via INTRAVENOUS
  Filled 2017-04-11 (×2): qty 50

## 2017-04-11 MED ORDER — FUROSEMIDE 40 MG PO TABS
40.0000 mg | ORAL_TABLET | Freq: Every day | ORAL | Status: DC
  Administered 2017-04-11 – 2017-04-12 (×2): 40 mg via ORAL
  Filled 2017-04-11 (×2): qty 1

## 2017-04-11 MED ORDER — PROPOFOL 500 MG/50ML IV EMUL
INTRAVENOUS | Status: DC | PRN
  Administered 2017-04-11: 50 ug/kg/min via INTRAVENOUS

## 2017-04-11 MED ORDER — CEFAZOLIN SODIUM-DEXTROSE 2-3 GM-%(50ML) IV SOLR
INTRAVENOUS | Status: DC | PRN
  Administered 2017-04-11: 2 g via INTRAVENOUS

## 2017-04-11 MED ORDER — FENTANYL CITRATE (PF) 100 MCG/2ML IJ SOLN
INTRAMUSCULAR | Status: DC | PRN
  Administered 2017-04-11: 50 ug via INTRAVENOUS

## 2017-04-11 MED ORDER — POLYMYXIN B SULFATE 500000 UNITS IJ SOLR
INTRAMUSCULAR | Status: DC | PRN
  Administered 2017-04-11: 500 mL

## 2017-04-11 MED ORDER — INFLUENZA VAC SPLIT HIGH-DOSE 0.5 ML IM SUSY
0.5000 mL | PREFILLED_SYRINGE | INTRAMUSCULAR | Status: DC
  Filled 2017-04-11: qty 0.5

## 2017-04-11 SURGICAL SUPPLY — 50 items
BANDAGE ACE 4X5 VEL STRL LF (GAUZE/BANDAGES/DRESSINGS) ×4 IMPLANT
BLADE CLIPPER SURG (BLADE) IMPLANT
BNDG GAUZE ELAST 4 BULKY (GAUZE/BANDAGES/DRESSINGS) ×2 IMPLANT
CANISTER SUCT 3000ML PPV (MISCELLANEOUS) ×3 IMPLANT
CANISTER WOUND CARE 500ML ATS (WOUND CARE) ×2 IMPLANT
CHLORAPREP W/TINT 26ML (MISCELLANEOUS) ×2 IMPLANT
COVER SURGICAL LIGHT HANDLE (MISCELLANEOUS) ×1 IMPLANT
DRAPE HALF SHEET 40X57 (DRAPES) IMPLANT
DRAPE INCISE IOBAN 66X45 STRL (DRAPES) ×2 IMPLANT
DRAPE ORTHO SPLIT 77X108 STRL (DRAPES) ×6
DRAPE SURG ORHT 6 SPLT 77X108 (DRAPES) IMPLANT
DRESSING HYDROCOLLOID 4X4 (GAUZE/BANDAGES/DRESSINGS) ×3 IMPLANT
DRSG ADAPTIC 3X8 NADH LF (GAUZE/BANDAGES/DRESSINGS) IMPLANT
DRSG CUTIMED SORBACT 7X9 (GAUZE/BANDAGES/DRESSINGS) ×2 IMPLANT
DRSG PAD ABDOMINAL 8X10 ST (GAUZE/BANDAGES/DRESSINGS) IMPLANT
DRSG VAC ATS LRG SENSATRAC (GAUZE/BANDAGES/DRESSINGS) ×2 IMPLANT
DRSG VAC ATS MED SENSATRAC (GAUZE/BANDAGES/DRESSINGS) IMPLANT
DRSG VAC ATS SM SENSATRAC (GAUZE/BANDAGES/DRESSINGS) IMPLANT
ELECT REM PT RETURN 9FT ADLT (ELECTROSURGICAL) ×3
ELECTRODE REM PT RTRN 9FT ADLT (ELECTROSURGICAL) ×1 IMPLANT
GAUZE SPONGE 4X4 12PLY STRL (GAUZE/BANDAGES/DRESSINGS) IMPLANT
GEL ULTRASOUND 20GR AQUASONIC (MISCELLANEOUS) IMPLANT
GLOVE BIO SURGEON STRL SZ 6.5 (GLOVE) ×4 IMPLANT
GLOVE BIO SURGEONS STRL SZ 6.5 (GLOVE) ×2
GOWN STRL REUS W/ TWL LRG LVL3 (GOWN DISPOSABLE) ×2 IMPLANT
GOWN STRL REUS W/TWL LRG LVL3 (GOWN DISPOSABLE) ×6
HANDPIECE INTERPULSE COAX TIP (DISPOSABLE)
KIT BASIN OR (CUSTOM PROCEDURE TRAY) ×3 IMPLANT
KIT ROOM TURNOVER OR (KITS) ×3 IMPLANT
MATRIX WOUND 3-LAYER 10X15 (Tissue) ×1 IMPLANT
MICROMATRIX 1000MG (Tissue) ×6 IMPLANT
NS IRRIG 1000ML POUR BTL (IV SOLUTION) ×1 IMPLANT
PACK GENERAL/GYN (CUSTOM PROCEDURE TRAY) ×3 IMPLANT
PAD ARMBOARD 7.5X6 YLW CONV (MISCELLANEOUS) ×6 IMPLANT
PAD NEG PRESSURE SENSATRAC (MISCELLANEOUS) IMPLANT
PADDING CAST SYN 6 (CAST SUPPLIES) ×2
PADDING CAST SYNTHETIC 6X4 NS (CAST SUPPLIES) IMPLANT
SET HNDPC FAN SPRY TIP SCT (DISPOSABLE) IMPLANT
SOLUTION PARTIC MCRMTRX 1000MG (Tissue) IMPLANT
SPLINT PLASTER EXTRA FAST 3X15 (CAST SUPPLIES) ×2
SPLINT PLASTER GYPS XFAST 3X15 (CAST SUPPLIES) IMPLANT
SUT SILK 4 0 PS 2 (SUTURE) ×2 IMPLANT
SUT VIC AB 5-0 PC1 18 (SUTURE) ×6 IMPLANT
SUT VIC AB 5-0 PS2 18 (SUTURE) IMPLANT
SWAB COLLECTION DEVICE MRSA (MISCELLANEOUS) ×2 IMPLANT
SWAB CULTURE ESWAB REG 1ML (MISCELLANEOUS) ×2 IMPLANT
TOWEL OR 17X24 6PK STRL BLUE (TOWEL DISPOSABLE) IMPLANT
TOWEL OR 17X26 10 PK STRL BLUE (TOWEL DISPOSABLE) ×3 IMPLANT
UNDERPAD 30X30 (UNDERPADS AND DIAPERS) ×2 IMPLANT
WOUND MATRIX 3-LAYER 10X15 (Tissue) ×1 IMPLANT

## 2017-04-11 NOTE — Anesthesia Postprocedure Evaluation (Signed)
Anesthesia Post Note  Patient: [name]  Procedure(s) Performed: EXCISION DEBRIDEMENT OF LEFT ARM WOUND (Left ) APPLICATION OF WOUND VAC AND ACELL (Left )     Patient location during evaluation: PACU Anesthesia Type: Regional and MAC Level of consciousness: awake and alert Pain management: pain level controlled Vital Signs Assessment: post-procedure vital signs reviewed and stable Respiratory status: spontaneous breathing, nonlabored ventilation, respiratory function stable and patient connected to nasal cannula oxygen Cardiovascular status: stable and blood pressure returned to baseline Postop Assessment: no apparent nausea or vomiting Anesthetic complications: no    Last Vitals:  Vitals:   04/11/17 1033 04/11/17 1047  BP: 122/65 121/68  Pulse: 82 80  Resp: 18 18  Temp:  (!) 36.4 C  SpO2: 100% 96%    Last Pain:  Vitals:   04/11/17 1047  TempSrc:   PainSc: 0-No pain                 Jhalen Eley,W. EDMOND

## 2017-04-11 NOTE — H&P (Signed)
[name] is an 71 y.o. female.   Chief Complaint: left arm wound HPI: The patient is a 71 yrs old wf here with her husband for treatment of her left arm.  She had an IV infiltration several weeks ago and now has a wound of the arm.  It has gotten progressive worse over the past week.  Her hand was swollen and cool.  She was admitted for platelet treatment and anticoagulation stabilization.  The swelling has improved.  She understands this is a serious situation.  Past Medical History:  Diagnosis Date  . Acute ITP ([place]) 10/09/2016  . Anemia   . Antiphospholipid antibody syndrome ([place])   . Arthritis    "may have some" (03/28/2012)  . Atrial flutter ([place])    atypical right atrial flutter s/p ablation at [facility] by Dr Clyda Hurdle  . Cerebrovascular accident [facility]) ?1990; 1993; 2007   residual "can't use my right hand; left visual field cut" (03/28/2012)  . CHF (congestive heart failure) ([place])   . 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 Chronic kidney disease    "as a result of my heart failing" (03/28/2012)  . Fine motor impairment    "fingers right hand" (03/28/2012)  . Heart murmur   . 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 History of blood transfusion    "lots; most in 2007 w/MVR OR"  . History of Hashimoto thyroiditis   . HLD (hyperlipidemia)   . HTN (hypertension)   . Hypothyroidism   . Lymphedema of left arm 03/30/2017  . 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 Pacemaker   . Rheumatic heart disease 1995   post percutaneous valvuloplasty at [facility] with subsequent mechanical mitral valve replacement and tricuspid annuloplasty at [facility]  . Right leg swelling 10/06/2013   Asymmetric; no calf pain, venous doppler negative for DVT 10/06/13  . Subdural hematoma ([place]) 2009   on coumadin    Past Surgical History:  Procedure Laterality Date  . AMPUTATION     left atrial appendage amputated with MVR  . ATRIAL ABLATION SURGERY      CTI and R atriostomy scar flutter ablation at [facility] 8/12,  repeat  ablation at [facility]  9/12  . BI-VENTRICULAR PACEMAKER INSERTION N/A 03/28/2012   Procedure: BI-VENTRICULAR PACEMAKER INSERTION (CRT-P);  Surgeon: Evans Lance, MD;  Location: [facility] CATH LAB;  Service: Cardiovascular;  Laterality: N/A;  . biventricular pacemaker placement  03/28/2012   SJM Anthem implanted by Dr Lovena Le  . CARDIAC ELECTROPHYSIOLOGY [place] AND ABLATION  2012   "2 @ [facility]" (03/28/2012  . CARDIAC VALVE REPLACEMENT    . CARDIOVERSION  2010-2011   "3 at [facility]" (03/28/2012)  . CHOLECYSTECTOMY    . CRANIOTOMY  2008   for SDH  . IR FLUORO GUIDE CV MIDLINE PICC RIGHT  04/09/2017  . IR US GUIDE VASC ACCESS RIGHT  04/09/2017  . MITRAL VALVE REPLACEMENT  2007   with tricuspid annuloplasty  . [place] with subsequent mechanical mitral valve replacement and tricuspid annuloplasty at [facility]  . TONSILLECTOMY    . TONSILLECTOMY     "when I was a child"  . TOTAL ABDOMINAL HYSTERECTOMY  ?1995   TAH; BSO    Family History  Problem Relation Age of Onset  . Heart disease Father        late 64's early 80's-CABG  . Dementia Father   . Diabetes Father   .  Melanoma Father   . Cancer - Colon Mother   . Cancer - Lung Brother    Social History:  reports that she quit smoking about 25 years ago. Her smoking use included Cigarettes. She has a 5.00 pack-year smoking history. She has never used smokeless tobacco. She reports that she drinks alcohol. She reports that she does not use drugs.  Allergies:  Allergies  Allergen Reactions  . Amiodarone Hcl Other (See Comments)    "torsades; v tach"  . Coumadin [Warfarin Sodium] Other (See Comments)    "bled into my head"  . 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 Vitamin K Anaphylaxis    IV only allergy  . Iodinated Diagnostic Agents Itching    "over 35 years ago" (03/28/2012)  . Metrizamide Itching    "over 35 years ago"  (03/28/2012)    Medications Prior to Admission  Medication Sig Dispense Refill  . acetaminophen (TYLENOL) 650 MG CR tablet Take 1,300 mg by mouth 3 (three) times daily as needed for pain.     . Calcium Citrate (CITRACAL PO) Take 1 tablet by mouth daily.    . Cholecalciferol (VITAMIN D) 2000 UNITS tablet Take 2,000 Units by mouth daily.      . isosorbide mononitrate (IMDUR) 30 MG 24 hr tablet Take 1 tablet (30 mg total) by mouth daily. 30 tablet 3  . metoprolol succinate (TOPROL-XL) 50 MG 24 hr tablet TAKE ONE (1) TABLET BY MOUTH TWO (2) TIMES DAILY TAKE WITH OR IMMEDIATELY FOLLOWING A MEAL 180 tablet 3  . Misc Natural Products (TART CHERRY ADVANCED PO) Take 2 tablets by mouth daily.    . Omega-3 Fatty Acids (OMEGA-3 FISH OIL) 500 MG CAPS Take 1 capsule by mouth daily.    Marland Kitchen oxyCODONE (ROXICODONE) 5 MG immediate release tablet 1 or 2 tablets every 6 hours as needed for pain (Patient taking differently: 5 mg every 6 (six) hours as needed. 1 or 2 tablets every 6 hours as needed for pain) 60 tablet 0  . silver sulfADIAZINE (SILVADENE) 1 % cream Apply 1 application topically 2 (two) times daily.    . traMADol (ULTRAM) 50 MG tablet Take 1 tablet (50 mg total) by mouth every 6 (six) hours as needed for severe pain. 30 tablet 2  . zolpidem (AMBIEN) 5 MG tablet Take 1 tablet (5 mg total) by mouth at bedtime as needed for sleep. 30 tablet 1  . AMOXICILLIN PO Take 4 capsules by mouth as directed. ONLY WHEN VISITING DENTIST    . eltrombopag (PROMACTA) 50 MG tablet Take 50 mg by mouth every other day. Take on an empty stomach 1 hour before a meal or 2 hours after     . fondaparinux (ARIXTRA) 2.5 MG/0.5ML SOLN injection INJECT 0.5ML INTO THE SKIN EVERY OTHER DAY 9.5 mL 11  . furosemide (LASIX) 20 MG tablet Take 1 tablet (20 mg total) by mouth daily. 90 tablet 3  . hydrALAZINE (APRESOLINE) 25 MG tablet Take 0.5 tablets (12.5 mg total) by mouth 3 (three) times daily. 45 tablet 3  . levothyroxine (SYNTHROID,  LEVOTHROID) 88 MCG tablet Take 88 mcg by mouth daily before breakfast.    . valACYclovir (VALTREX) 1000 MG tablet Take 1,000 mg by mouth 2 (two) times daily. For a total of 3 doses for labial herpes prn only      Results for orders placed or performed during the hospital encounter of 04/09/17 (from the past 48 hour(s))  MRSA PCR Screening  Status: None   Collection Time: 04/09/17  4:38 PM  Result Value Ref Range   MRSA by PCR NEGATIVE NEGATIVE    Comment:        The GeneXpert MRSA Assay (FDA approved for NASAL specimens only), is one component of a comprehensive MRSA colonization surveillance program. It is not intended to diagnose MRSA infection nor to guide or monitor treatment for MRSA infections.   CBC     Status: Abnormal   Collection Time: 04/09/17  6:12 PM  Result Value Ref Range   WBC 8.9 4.0 - 10.5 K/uL   RBC 2.91 (L) 3.87 - 5.11 MIL/uL   Hemoglobin 8.0 (L) 12.0 - 15.0 g/dL   HCT 25.1 (L) 36.0 - 46.0 %   MCV 86.3 78.0 - 100.0 fL   MCH 27.5 26.0 - 34.0 pg   MCHC 31.9 30.0 - 36.0 g/dL   RDW 15.5 11.5 - 15.5 %   Platelets 19 (LL) 150 - 400 K/uL    Comment: REPEATED TO VERIFY SPECIMEN CHECKED FOR CLOTS CRITICAL RESULT CALLED TO, READ BACK BY AND VERIFIED WITH: C YEP,RN 1922 04/09/2017 WBOND   Comprehensive metabolic panel     Status: Abnormal   Collection Time: 04/09/17  6:12 PM  Result Value Ref Range   Sodium 134 (L) 135 - 145 mmol/L   Potassium 3.6 3.5 - 5.1 mmol/L   Chloride 101 101 - 111 mmol/L   CO2 22 22 - 32 mmol/L   Glucose, Bld 165 (H) 65 - 99 mg/dL   BUN 113 (H) 6 - 20 mg/dL   Creatinine, Ser 3.13 (H) 0.44 - 1.00 mg/dL   Calcium 8.5 (L) 8.9 - 10.3 mg/dL   Total Protein 6.2 (L) 6.5 - 8.1 g/dL   Albumin 2.9 (L) 3.5 - 5.0 g/dL   AST 20 15 - 41 U/L   ALT 14 14 - 54 U/L   Alkaline Phosphatase 96 38 - 126 U/L   Total Bilirubin <0.1 (L) 0.3 - 1.2 mg/dL   GFR calc non Af Amer 14 (L) >60 mL/min   GFR calc Af Amer 16 (L) >60 mL/min    Comment:  (NOTE) The eGFR has been calculated using the CKD EPI equation. This calculation has not been validated in all clinical situations. eGFR's persistently <60 mL/min signify possible Chronic Kidney Disease.    Anion gap 11 5 - 15  Protime-INR     Status: None   Collection Time: 04/09/17  6:12 PM  Result Value Ref Range   Prothrombin Time 15.1 11.4 - 15.2 seconds   INR 1.20   APTT     Status: Abnormal   Collection Time: 04/09/17  6:12 PM  Result Value Ref Range   aPTT 49 (H) 24 - 36 seconds    Comment:        IF BASELINE aPTT IS ELEVATED, SUGGEST PATIENT RISK ASSESSMENT BE USED TO DETERMINE APPROPRIATE ANTICOAGULANT THERAPY.   Low molecular wgt heparin (fractionated)     Status: None   Collection Time: 04/09/17  6:12 PM  Result Value Ref Range   Heparin LMW 0.27 IU/mL    Comment:        THERAPEUTIC RANGE: DVT,PE,ACS on LMWH 1 mg/kg q12 at 4 hrs = 0.5-1.2 units/mL. DVT,PE on LMWH 1.5 mg/kg q24 at 4 hrs = 1-2 units/mL.   Basic metabolic panel     Status: Abnormal   Collection Time: 04/10/17  2:55 AM  Result Value Ref Range   Sodium 131 (L) 135 -  145 mmol/L   Potassium 3.5 3.5 - 5.1 mmol/L   Chloride 100 (L) 101 - 111 mmol/L   CO2 21 (L) 22 - 32 mmol/L   Glucose, Bld 137 (H) 65 - 99 mg/dL   BUN 108 (H) 6 - 20 mg/dL   Creatinine, Ser 3.02 (H) 0.44 - 1.00 mg/dL   Calcium 7.9 (L) 8.9 - 10.3 mg/dL   GFR calc non Af Amer 15 (L) >60 mL/min   GFR calc Af Amer 17 (L) >60 mL/min    Comment: (NOTE) The eGFR has been calculated using the CKD EPI equation. This calculation has not been validated in all clinical situations. eGFR's persistently <60 mL/min signify possible Chronic Kidney Disease.    Anion gap 10 5 - 15  APTT     Status: Abnormal   Collection Time: 04/10/17  2:55 AM  Result Value Ref Range   aPTT 81 (H) 24 - 36 seconds    Comment:        IF BASELINE aPTT IS ELEVATED, SUGGEST PATIENT RISK ASSESSMENT BE USED TO DETERMINE APPROPRIATE ANTICOAGULANT THERAPY.   CBC      Status: Abnormal   Collection Time: 04/10/17  2:55 AM  Result Value Ref Range   WBC 10.0 4.0 - 10.5 K/uL   RBC 2.65 (L) 3.87 - 5.11 MIL/uL   Hemoglobin 7.3 (L) 12.0 - 15.0 g/dL   HCT 23.1 (L) 36.0 - 46.0 %   MCV 87.2 78.0 - 100.0 fL   MCH 27.5 26.0 - 34.0 pg   MCHC 31.6 30.0 - 36.0 g/dL   RDW 15.6 (H) 11.5 - 15.5 %   Platelets 24 (LL) 150 - 400 K/uL    Comment: REPEATED TO VERIFY CRITICAL VALUE NOTED.  VALUE IS CONSISTENT WITH PREVIOUSLY REPORTED AND CALLED VALUE.   APTT     Status: Abnormal   Collection Time: 04/10/17  8:00 AM  Result Value Ref Range   aPTT 89 (H) 24 - 36 seconds    Comment:        IF BASELINE aPTT IS ELEVATED, SUGGEST PATIENT RISK ASSESSMENT BE USED TO DETERMINE APPROPRIATE ANTICOAGULANT THERAPY.   Type and screen [place]     Status: None   Collection Time: 04/10/17  2:27 PM  Result Value Ref Range   ABO/RH(D) O NEG    Antibody Screen NEG    Sample Expiration 04/13/2017    Unit Number [id number]    Blood Component Type RBC, LR IRR    Unit division 00    Status of Unit ISSUED,FINAL    Transfusion Status OK TO TRANSFUSE    Crossmatch Result Compatible   Prepare RBC     Status: None   Collection Time: 04/10/17  2:27 PM  Result Value Ref Range   Order Confirmation ORDER PROCESSED BY BLOOD BANK   Surgical PCR screen     Status: None   Collection Time: 04/10/17 10:33 PM  Result Value Ref Range   MRSA, PCR NEGATIVE NEGATIVE   Staphylococcus aureus NEGATIVE NEGATIVE    Comment: (NOTE) The Xpert SA Assay (FDA approved for NASAL specimens in patients 44 years of age and older), is one component of a comprehensive surveillance program. It is not intended to diagnose infection nor to guide or monitor treatment.   APTT     Status: Abnormal   Collection Time: 04/11/17  4:28 AM  Result Value Ref Range   aPTT 91 (H) 24 - 36 seconds    Comment:  IF BASELINE aPTT IS ELEVATED, SUGGEST PATIENT RISK ASSESSMENT BE USED TO  DETERMINE APPROPRIATE ANTICOAGULANT THERAPY.   CBC     Status: Abnormal   Collection Time: 04/11/17  4:28 AM  Result Value Ref Range   WBC 7.0 4.0 - 10.5 K/uL   RBC 3.23 (L) 3.87 - 5.11 MIL/uL   Hemoglobin 8.7 (L) 12.0 - 15.0 g/dL   HCT 27.9 (L) 36.0 - 46.0 %   MCV 86.4 78.0 - 100.0 fL   MCH 26.9 26.0 - 34.0 pg   MCHC 31.2 30.0 - 36.0 g/dL   RDW 15.4 11.5 - 15.5 %   Platelets 52 (L) 150 - 400 K/uL    Comment: PLATELET COUNT CONFIRMED BY SMEAR  Basic metabolic panel     Status: Abnormal   Collection Time: 04/11/17  4:28 AM  Result Value Ref Range   Sodium 129 (L) 135 - 145 mmol/L   Potassium 3.7 3.5 - 5.1 mmol/L   Chloride 98 (L) 101 - 111 mmol/L   CO2 21 (L) 22 - 32 mmol/L   Glucose, Bld 117 (H) 65 - 99 mg/dL   BUN 108 (H) 6 - 20 mg/dL   Creatinine, Ser 2.78 (H) 0.44 - 1.00 mg/dL   Calcium 8.0 (L) 8.9 - 10.3 mg/dL   GFR calc non Af Amer 16 (L) >60 mL/min   GFR calc Af Amer 19 (L) >60 mL/min    Comment: (NOTE) The eGFR has been calculated using the CKD EPI equation. This calculation has not been validated in all clinical situations. eGFR's persistently <60 mL/min signify possible Chronic Kidney Disease.    Anion gap 10 5 - 15   Ir US Guide Vasc Access Right  Result Date: 04/10/2017 INDICATION: Left arm infection EXAM: RIGHT UPPER EXTREMITY PICC LINE PLACEMENT WITH ULTRASOUND AND FLUOROSCOPIC GUIDANCE MEDICATIONS: None ANESTHESIA/SEDATION: None FLUOROSCOPY TIME:  Fluoroscopy Time:  minutes 12 seconds (1 mGy). COMPLICATIONS: None immediate. PROCEDURE: The patient was advised of the possible risks and complications and agreed to undergo the procedure. The patient was then brought to the angiographic suite for the procedure. The right arm was prepped with chlorhexidine, draped in the usual sterile fashion using maximum barrier technique (cap and mask, sterile gown, sterile gloves, large sterile sheet, hand hygiene and cutaneous antiseptic). Local anesthesia was attained by  infiltration with 1% lidocaine. Ultrasound demonstrated patency of the basilic vein, and this was documented with an image. Under real-time ultrasound guidance, this vein was accessed with a 21 gauge micropuncture needle and image documentation was performed. The needle was exchanged over a guidewire for a peel-away sheath through which a 39 cm 5 [place] double lumen power injectable PICC was advanced, and positioned with its tip at the lower SVC/right atrial junction. Fluoroscopy during the procedure and fluoro spot radiograph confirms appropriate catheter position. The catheter was flushed, secured to the skin with Prolene sutures, and covered with a sterile dressing. IMPRESSION: Successful placement of a right arm PICC with sonographic and fluoroscopic guidance. The catheter is ready for use. Electronically Signed   By: Marybelle Killings M.D.   On: 04/10/2017 09:27   Ir Fluoro Guide Cv Midline Picc Right  Result Date: 04/10/2017 INDICATION: Left arm infection EXAM: RIGHT UPPER EXTREMITY PICC LINE PLACEMENT WITH ULTRASOUND AND FLUOROSCOPIC GUIDANCE MEDICATIONS: None ANESTHESIA/SEDATION: None FLUOROSCOPY TIME:  Fluoroscopy Time:  minutes 12 seconds (1 mGy). COMPLICATIONS: None immediate. PROCEDURE: The patient was advised of the possible risks and complications and agreed to undergo the procedure. The patient was then  brought to the angiographic suite for the procedure. The right arm was prepped with chlorhexidine, draped in the usual sterile fashion using maximum barrier technique (cap and mask, sterile gown, sterile gloves, large sterile sheet, hand hygiene and cutaneous antiseptic). Local anesthesia was attained by infiltration with 1% lidocaine. Ultrasound demonstrated patency of the basilic vein, and this was documented with an image. Under real-time ultrasound guidance, this vein was accessed with a 21 gauge micropuncture needle and image documentation was performed. The needle was exchanged over a guidewire  for a peel-away sheath through which a 39 cm 5 [place] double lumen power injectable PICC was advanced, and positioned with its tip at the lower SVC/right atrial junction. Fluoroscopy during the procedure and fluoro spot radiograph confirms appropriate catheter position. The catheter was flushed, secured to the skin with Prolene sutures, and covered with a sterile dressing. IMPRESSION: Successful placement of a right arm PICC with sonographic and fluoroscopic guidance. The catheter is ready for use. Electronically Signed   By: Marybelle Killings M.D.   On: 04/10/2017 09:27    Review of Systems  Constitutional: Positive for malaise/fatigue.  HENT: Negative.   Eyes: Negative.   Respiratory: Negative.   Cardiovascular: Negative.   Gastrointestinal: Negative.   Genitourinary: Negative.   Musculoskeletal: Negative.   Skin: Negative.   Neurological: Positive for weakness. Negative for dizziness.  Psychiatric/Behavioral: Negative.     Blood pressure 122/88, pulse 87, temperature 98.7 F (37.1 C), temperature source Oral, resp. rate 18, height _0  (1.6 m), weight 73 kg (161 lb), SpO2 95 %. Physical Exam  Constitutional: She is oriented to person, place, and time. She appears well-developed.  HENT:  Head: Normocephalic and atraumatic.  Eyes: Pupils are equal, round, and reactive to light. EOM are normal.  Cardiovascular: Normal rate.   Respiratory: Effort normal.  Neurological: She is alert and oriented to person, place, and time.  Skin: There is erythema.  Psychiatric: She has a normal mood and affect. Her behavior is normal. Thought content normal.     Assessment/Plan Plan for debridement of left arm with placement of Acell and VAC.  Wallace Going, DO 04/11/2017, 8:28 AM

## 2017-04-11 NOTE — Progress Notes (Signed)
Hematology: She did well with surgery.  Wound did not extend into muscle.  Successful debridement and placement of Acell then overlying wound VAC by plastic surgery without complication.  Minimal blood loss.  (EBL 30 cc).  Wound did not appear to be infected.  Regional anesthesia used so we were able to avoid potential complications of general. She is currently afebrile.  Awake alert and oriented.  Chest clear.  Regular cardiac rhythm.  Mechanical heart valve sounds.  Wound dressing left arm not removed but dry. Platelet count 52,000 this morning following 2 doses of IVIG.  Hemoglobin up from 7.3 to 8.7 following transfusion yesterday. Impression: 1.  Necrotic skin wound in a patient with acute on chronic ITP, significant thrombocytopenia but need for anticoagulation in view of antiphospholipid antibody syndrome and mechanical mitral valve. We will hold her parenteral direct thrombin inhibitor for 6 hours then resume.  Continue overnight.  If wound otherwise stable, transition back to her regular dose of Arixtra tomorrow. 2.  Acute on chronic ITP I am going to discontinue her Promacta.  She did not get a clear response to this drug or to the parenteral drug Romiplostim.  I will pursue third line therapy with newly approved fosfanitinib, and oral drug that inhibits binding of antibiotic coated platelets in the liver and spleen by blocking Fc complement receptors.

## 2017-04-11 NOTE — Anesthesia Procedure Notes (Signed)
Anesthesia Regional Block: Supraclavicular block   Pre-Anesthetic Checklist: ,, timeout performed, Correct Patient, Correct Site, Correct Laterality, Correct Procedure, Correct Position, site marked, Risks and benefits discussed, pre-op evaluation,  At surgeon's request and post-op pain management  Laterality: Left  Prep: Maximum Sterile Barrier Precautions used, chloraprep       Needles:  Injection technique: Single-shot  Needle Type: Echogenic Stimulator Needle     Needle Length: 5cm  Needle Gauge: 22     Additional Needles:   Procedures:,,,, ultrasound used (permanent image in chart),,,,  Narrative:  Start time: 04/11/2017 8:15 AM End time: 04/11/2017 8:25 AM Injection made incrementally with aspirations every 5 mL. Anesthesiologist: Roderic Palau  Additional Notes: 2% Lidocaine skin wheel.

## 2017-04-11 NOTE — Progress Notes (Signed)
[place] for Argatroban Indication: MVR and antiphospholipid antibody syndrome   Allergies  Allergen Reactions  . Amiodarone Hcl Other (See Comments)    "torsades; v tach"  . Coumadin [Warfarin Sodium] Other (See Comments)    "bled into my head"  . 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 Vitamin K Anaphylaxis    IV only allergy  . Iodinated Diagnostic Agents Itching    "over 35 years ago" (03/28/2012)  . Metrizamide Itching    "over 35 years ago" (03/28/2012)    Labs:  Recent Labs  04/09/17 1812 04/10/17 0255  04/11/17 0428 04/11/17 1248 04/11/17 1954  HGB 8.0* 7.3*  --  8.7*  --   --   HCT 25.1* 23.1*  --  27.9*  --   --   PLT 19* 24*  --  52*  --   --   APTT 49* 81*  < > 91* 85* 81*  LABPROT 15.1  --   --   --   --   --   INR 1.20  --   --   --   --   --   HEPRLOWMOCWT 0.27  --   --   --   --   --   CREATININE 3.13* 3.02*  --  2.78*  --   --   < > = values in this interval not displayed.  Estimated Creatinine Clearance: 18 mL/min (A) (by C-G formula based on SCr of 2.78 mg/dL (H)).   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  S/p excision and VAC placement by plastics this morning. Argatroban to restart at 1600 this afternoon.  APTT was elevated this morning at 91 seconds on 67mcg/kg/min, rate was decreased to 0.75mcg/kg/min. No excessive bleeding noted intra-op.  PM f/u APTT within goal range.  Noted plans to resume Arixtra tomorrow per team's note.   Goal of Therapy:  PTT = 50 to 90 seconds Monitor platelets by anticoagulation protocol: Yes   Plan: -Continue argatroban at 0.64mcg/kg/min at 1600. -Daily aPTT (IV team drawing) -Follow for restart of Arixtra  Uvaldo Rising, BCPS  Clinical Pharmacist Pager [phone]  04/11/2017 9:02 PM

## 2017-04-11 NOTE — Care Management Note (Signed)
Case Management Note  Patient Details  Name: SHABREA WELDIN MRN: [id number] Date of Birth: 1946/04/07  Subjective/Objective:                    Action/Plan: Pt s/p I & D of arm wound. CM consulted for home wound vac with [facility]. CM notified Ricki Toye with [employer] of referral. Order form for the [facility] placed on the front of the patients chart for MD and bedside RN aware that it needs signed.  Will also need orders for [facility] RN at d/c.  CM following.   Expected Discharge Date:  04/11/17               Expected Discharge Plan:  [place]  In-House Referral:     Discharge planning Services  CM Consult  Post Acute Care Choice:  Durable Medical Equipment, Home Health Choice offered to:     DME Arranged:    DME Agency:     HH Arranged:  RN [place] Agency:     Status of Service:  In process, will continue to follow  If discussed at Long Length of Stay Meetings, dates discussed:    Additional Comments:  Pollie Friar, RN 04/11/2017, 2:54 PM

## 2017-04-11 NOTE — Progress Notes (Signed)
ANTICOAGULATION CONSULT NOTE - Pharmacy Consult for Argatroban Indication: MVR and antiphospholipid antibody syndrome   Allergies  Allergen Reactions  . Amiodarone Hcl Other (See Comments)    "torsades; v tach"  . Coumadin [Warfarin Sodium] Other (See Comments)    "bled into my head"  . 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 Vitamin K Anaphylaxis    IV only allergy  . Iodinated Diagnostic Agents Itching    "over 35 years ago" (03/28/2012)  . Metrizamide Itching    "over 35 years ago" (03/28/2012)    Labs:  Recent Labs  04/09/17 1812 04/10/17 0255 04/10/17 0800 04/11/17 0428  HGB 8.0* 7.3*  --  8.7*  HCT 25.1* 23.1*  --  27.9*  PLT 19* 24*  --  PENDING  APTT 49* 81* 89* 91*  LABPROT 15.1  --   --   --   INR 1.20  --   --   --   HEPRLOWMOCWT 0.27  --   --   --   CREATININE 3.13* 3.02*  --   --     Estimated Creatinine Clearance: 16.6 mL/min (A) (by C-G formula based on SCr of 3.02 mg/dL (H)).   Assessment: 71 year old female with h/o MVR, Arixtra on hold while awaiting surgery, for argatroban  Goal of Therapy:  aPTT = 50 to 90 seconds Monitor platelets by anticoagulation protocol: Yes   Plan:  Decrease argatroban 0.8 mcg/kg/min aPTT at 1000  Phillis Knack, PharmD, BCPS 04/11/2017,5:43 AM

## 2017-04-11 NOTE — Progress Notes (Signed)
Report given to jada rn as caregiver 
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
## 2017-04-11 NOTE — Progress Notes (Signed)
[place] for Argatroban Indication: MVR and antiphospholipid antibody syndrome   Allergies  Allergen Reactions  . Amiodarone Hcl Other (See Comments)    "torsades; v tach"  . Coumadin [Warfarin Sodium] Other (See Comments)    "bled into my head"  . 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 Vitamin K Anaphylaxis    IV only allergy  . Iodinated Diagnostic Agents Itching    "over 35 years ago" (03/28/2012)  . Metrizamide Itching    "over 35 years ago" (03/28/2012)    Labs:  Recent Labs  04/09/17 1812 04/10/17 0255 04/10/17 0800 04/11/17 0428  HGB 8.0* 7.3*  --  8.7*  HCT 25.1* 23.1*  --  27.9*  PLT 19* 24*  --  52*  APTT 49* 81* 89* 91*  LABPROT 15.1  --   --   --   INR 1.20  --   --   --   HEPRLOWMOCWT 0.27  --   --   --   CREATININE 3.13* 3.02*  --  2.78*    Estimated Creatinine Clearance: 18 mL/min (A) (by C-G formula based on SCr of 2.78 mg/dL (H)).   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  S/p excision and VAC placement by plastics this morning. Argatroban to restart at 1600 this afternoon.  APTT was elevated this morning at 91 seconds on 43mcg/kg/min, rate was decreased to 0.43mcg/kg/min. No excessive bleeding noted intra-op.  Noted plans to resume Arixtra tomorrow per team's note.   Goal of Therapy:  PTT = 50 to 90 seconds Monitor platelets by anticoagulation protocol: Yes   Plan: -Resume argatroban at 0.55mcg/kg/min at 1600 this afternoon -Check aPTT in 2hr after resumption (ordered for 1800) -Daily aPTT -Follow for restart of Arixtra  Edrees Valent D. Christalynn Boise, PharmD, BCPS Clinical Pharmacist Pager: [phone] Clinical Phone for 04/11/2017 until 3:30pm: [phone] If after 3:30pm, please call main pharmacy at [phone] 04/11/2017 12:11 PM

## 2017-04-11 NOTE — Progress Notes (Signed)
Internal Medicine Attending:   I saw and examined the patient. I reviewed the resident's note and I agree with the resident's findings and plan as documented in the resident's note. Doing well, much better spirit post op.  Reports arm still numb from regional anesthesia but happy that everything went well. As noted by Dr Aggie Hacker, plts improved, hgb stable, renal function stable.

## 2017-04-11 NOTE — Anesthesia Procedure Notes (Signed)
Procedure Name: MAC Date/Time: 04/11/2017 8:50 AM Performed by: Eligha Bridegroom Pre-anesthesia Checklist: Patient identified, Emergency Drugs available, Suction available, Patient being monitored and Timeout performed Patient Re-evaluated:Patient Re-evaluated prior to induction Oxygen Delivery Method: Simple face mask Preoxygenation: Pre-oxygenation with 100% oxygen Induction Type: IV induction

## 2017-04-11 NOTE — Progress Notes (Signed)
   Subjective:  Patient seen and examined. She tolerated surgery well and was in much higher spirits this morning. She currently has no pain or discomfort. She denies shortness of breath or chest pain.    Objective:  Vital signs in last 24 hours: Vitals:   04/11/17 1015 04/11/17 1033 04/11/17 1047 04/11/17 1123  BP: 119/60 122/65 121/68   Pulse: 87 82 80 78  Resp: (!) 26 18 18 20   Temp:   (!) 97.5 F (36.4 C) 97.6 F (36.4 C)  TempSrc:    Tympanic  SpO2: 100% 100% 96% 97%  Weight:      Height:       General: Laying in bed comfortably, NAD, Multiple ecchymoses on her skin. HEENT: [place]/AT, EOMI, no scleral icterus Cardiac: RRR, No R/M/G appreciated, mechanical valve sounds appreciatedPulm: normal effort, CTAB Abd: soft, non tender, non distended, BS normal Ext: Left arm wrapped with bandage, bandages appear clean and dry, wound vac placed; soft tissue swelling in dorsum of the hand; Radial pulse 2+; Symmetric ulnar pulse 2+ symmetric; SCDs on lower extremities bilaterally, no peripheral edema Neuro: alert and oriented X3, cranial nerves II-XII grossly intact  Assessment/Plan:  Principal Problem:   Arm wound, left, initial encounter Active Problems:   Thrombocytopenia (HCC)   Chronic combined systolic and diastolic congestive heart failure (HCC)   CKD (chronic kidney disease) stage 4, GFR 15-29 ml/min (HCC)   Antiphospholipid antibody with hypercoagulable state ([place])   Atrial fibrillation (HCC)   Chronic anticoagulation   Primary hypercoagulable state ([place])   Chronic ITP (idiopathic thrombocytopenia) (HCC)   S/P MVR (mitral valve replacement)   Anemia, chronic renal failure, stage 4 (severe) (HCC)   History of heparin-induced thrombocytopenia  Left Arm wound Thrombocytopenia 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 Platelets 52,000 this AM. Hgb 7.0 today  s/p 1 unit pRBCs.  -Argatroban infusion stopped until 4 pm tonight -Plan to restart Arixtra tomorrow AM -Pain control with Oxycodone IR 5 mg q6 hours PRN; Dilaudid 0.5 mg q2 hours if PO oxycodone not adequate pain control   -CBC in AM  HFrEF Estimated EF of 42%. Tolerated blood transfusion and IVIG x 2 well. Received several doses of lasix yesterday. No shortness of breath or peripheral edema on examination today.  -Repeat ECHO this admission per Cards  -Strict I/Os -Continue PO Lasix 20 mg oral daily -Metoprolol 50 mg BID -Hydralazine 12.5 mg TID -Imdur 30 mg QD   Chronic, refractory atrial fibrillation  Has previously failed multiple DCCV and ablation procedures. Cannot get amiodarone as she has had torsades in the past.  -Metoprolol 50 mg BID -Parenteral Argatroban for anticoagulation   CKD Cr 2.78, with GFR of 19, BUN 108. She does not want to be placed on dialysis.  -Continue to monitor  -BMET in the AM  Dispo: Anticipated discharge in approximately 1-2 days.   Melanee Spry, MD 04/11/2017, 11:47 AM Pager: [phone]

## 2017-04-11 NOTE — Transfer of Care (Signed)
Immediate Anesthesia Transfer of Care Note  Patient: [name]  Procedure(s) Performed: EXCISION DEBRIDEMENT OF LEFT ARM WOUND (Left ) APPLICATION OF WOUND VAC AND ACELL (Left )  Patient Location: PACU  Anesthesia Type:MAC combined with regional for post-op pain  Level of Consciousness: awake, alert  and oriented  Airway & Oxygen Therapy: Patient Spontanous Breathing and Patient connected to nasal cannula oxygen  Post-op Assessment: Report given to RN and Post -op Vital signs reviewed and stable  Post vital signs: Reviewed and stable  Last Vitals:  Vitals:   04/11/17 0657 04/11/17 1000  BP: 122/88 (!) (P) 133/56  Pulse: 87 (P) 90  Resp: 18 (P) 20  Temp: 37.1 C (!) (P) 36.1 C  SpO2: 95% (P) 97%    Last Pain:  Vitals:   04/11/17 1000  TempSrc:   PainSc: (P) 0-No pain         Complications: No apparent anesthesia complications

## 2017-04-11 NOTE — Progress Notes (Signed)
*  PRELIMINARY RESULTS* Echocardiogram 2D Echocardiogram has been performed.  Samuel Germany 04/11/2017, 4:04 PM

## 2017-04-12 ENCOUNTER — Other Ambulatory Visit: Payer: Self-pay | Admitting: Oncology

## 2017-04-12 ENCOUNTER — Encounter (HOSPITAL_COMMUNITY): Payer: Self-pay | Admitting: Plastic Surgery

## 2017-04-12 LAB — CBC
HCT: 24.6 % — ABNORMAL LOW (ref 36.0–46.0)
HEMOGLOBIN: 7.8 g/dL — AB (ref 12.0–15.0)
MCH: 27.2 pg (ref 26.0–34.0)
MCHC: 31.7 g/dL (ref 30.0–36.0)
MCV: 85.7 fL (ref 78.0–100.0)
Platelets: 67 10*3/uL — ABNORMAL LOW (ref 150–400)
RBC: 2.87 MIL/uL — AB (ref 3.87–5.11)
RDW: 15.4 % (ref 11.5–15.5)
WBC: 6.2 10*3/uL (ref 4.0–10.5)

## 2017-04-12 LAB — BASIC METABOLIC PANEL
Anion gap: 10 (ref 5–15)
BUN: 99 mg/dL — AB (ref 6–20)
CHLORIDE: 102 mmol/L (ref 101–111)
CO2: 22 mmol/L (ref 22–32)
Calcium: 7.8 mg/dL — ABNORMAL LOW (ref 8.9–10.3)
Creatinine, Ser: 2.89 mg/dL — ABNORMAL HIGH (ref 0.44–1.00)
GFR calc Af Amer: 18 mL/min — ABNORMAL LOW (ref >60)
GFR calc non Af Amer: 15 mL/min — ABNORMAL LOW (ref >60)
Glucose, Bld: 100 mg/dL — ABNORMAL HIGH (ref 65–99)
POTASSIUM: 3.5 mmol/L (ref 3.5–5.1)
SODIUM: 134 mmol/L — AB (ref 135–145)

## 2017-04-12 LAB — APTT: aPTT: 92 seconds — ABNORMAL HIGH (ref 24–36)

## 2017-04-12 LAB — MAGNESIUM: MAGNESIUM: 2.2 mg/dL (ref 1.7–2.4)

## 2017-04-12 MED ORDER — OXYCODONE HCL 5 MG PO TABS: 5.0000 mg | ORAL_TABLET | Freq: Four times a day (QID) | ORAL | 0 refills | Status: AC | PRN

## 2017-04-12 MED ORDER — FUROSEMIDE 20 MG PO TABS: ORAL_TABLET | ORAL | 3 refills | Status: AC

## 2017-04-12 MED ORDER — FLEET ENEMA 7-19 GM/118ML RE ENEM
1.0000 | ENEMA | Freq: Once | RECTAL | Status: AC
  Administered 2017-04-12: 1 via RECTAL
  Filled 2017-04-12: qty 1

## 2017-04-12 MED ORDER — FONDAPARINUX SODIUM 2.5 MG/0.5ML ~~LOC~~ SOLN
2.5000 mg | SUBCUTANEOUS | Status: DC
  Administered 2017-04-12: 2.5 mg via SUBCUTANEOUS
  Filled 2017-04-12: qty 0.5

## 2017-04-12 NOTE — Progress Notes (Signed)
Internal Medicine Attending:   I saw and examined the patient. I reviewed the resident's note and I agree with the resident's findings and plan as documented in the resident's note. No complaints wants to go home.  Discussed care with Dr Beryle Beams will resume arixtra today.  Otherwise from out standpoint she is stable for discharge, will clarify HH orders with Dr Marla Roe.

## 2017-04-12 NOTE — Progress Notes (Signed)
Patient states that she is ready for D/C even though wound VAC has not arrived.  Explained that she may lose groud with her wound care if she decided to leave.  Patient voices understanding but still desires to leave.  Dr Marla Roe notified. Wound VAC removed and wound covered with surgilube and covered with an ABD pad.  Dressing secured with Kerlex and over wrapped with an ACE wrap per DR Dillingham's instructions. Dressing is to be changed daily. Discharge instructions reviewed with the patient and her husband to include wound care, medications and follow up appointments.  Both voice understanding to teaching.  To door via wheelchair.  Home via [place] with her husband driving.

## 2017-04-12 NOTE — Care Management Note (Signed)
Case Management Note  Patient Details  Name: [name] MRN: [id number] Date of Birth: April 18, 1946  Subjective/Objective:      CM following for progression and d/c planning.               Action/Plan: 04/12/2017 Pt very anxious to d/c to home, this CM faxed application for VAC to [employer] @ 8:45am.  Notified by [employer] that this application will be forwarded to [facility] due to the area in which this pt lives [facility] will process her application.  3:15 pm notified by Arby Barrette, rep with [employer] that [employer] is requesting more info, to include all progress notes, H&P, Op notes, wound description emergency contact. Progess note for each day faxed , along with op note,emergency contact and all hospital notes re wound.  Pt informed of delay, pt very anxious to go home. This CM notifed Dr Marla Roe PA, Shawn Rayburn who will ask Dr Marla Roe to speak with this pt by phone and explain the importance of waiting for the [facility] to arrive.  This CM attempted to obtain a  Loan[place] VAC from [facility] for this pt , however this is not available.    Expected Discharge Date:  04/12/17               Expected Discharge Plan:  [address]  In-House Referral:     Discharge planning Services  CM Consult  Post Acute Care Choice:  Durable Medical Equipment, Home Health Choice offered to:  Patient  DME Arranged:  Vac DME Agency:  [employer]  HH Arranged:  RN [place] Agency:  [place]  Status of Service:  In process, will continue to follow  If discussed at Long Length of Stay Meetings, dates discussed:    Additional Comments:  Adron Bene, RN 04/12/2017, 4:46 PM

## 2017-04-12 NOTE — Plan of Care (Signed)
Problem: Safety: Goal: Ability to remain free from injury will improve Outcome: Not Met (add Reason) Patient refuses to have  Bed alarm activated.Marland KitchenMarland KitchenRationale given    Not needed prior days but post op  Now with right arm picc  Left arm wound vac  And bil  Leg scds   / alarm has been placed  Throughout this night but patient not pleased

## 2017-04-12 NOTE — Progress Notes (Signed)
Patient ID: [name], female   DOB: [birth year], 71 y.o.   MRN: [id number]  Plastic Surgery follow up: Patient tolerating VAC dressing well.  Does not need a VAC dressing change from [place] until October 29th as we will change VAC in office 04/19/17.    RAYBURN,SHAWN,PA-C Plastic Surgery [phone]  Agree

## 2017-04-12 NOTE — Progress Notes (Signed)
Advanced Heart Failure Rounding Note  PCP:  Primary Cardiologist: Dr Stanford Breed   Subjective:   S/P LUE Debridement with VAC and A cell placement .   Fees good. Wants to go home.    Objective:   Weight Range: 161 lb 6 oz (73.2 kg) Body mass index is 28.59 kg/m.   Vital Signs:   Temp:  [97.5 F (36.4 C)-98.7 F (37.1 C)] 98.4 F (36.9 C) (10/18 0908) Pulse Rate:  [77-93] 78 (10/18 0908) Resp:  [18-20] 19 (10/18 0908) BP: (114-128)/(65-82) 124/68 (10/18 0908) SpO2:  [93 %-100 %] 100 % (10/18 0908) Weight:  [161 lb 6 oz (73.2 kg)] 161 lb 6 oz (73.2 kg) (10/17 2050)    Weight change: Filed Weights   04/09/17 2040 04/10/17 2232 04/11/17 2050  Weight: 160 lb (72.6 kg) 161 lb (73 kg) 161 lb 6 oz (73.2 kg)    Intake/Output:   Intake/Output Summary (Last 24 hours) at 04/12/17 1024 Last data filed at 04/12/17 0900  Gross per 24 hour  Intake           814.16 ml  Output             1100 ml  Net          -285.84 ml      Physical Exam    General:  Well appearing. No resp difficulty HEENT: Normal Neck: Supple. JVP 10-11 cm. Carotids 2+ bilat; no bruits. No lymphadenopathy or thyromegaly appreciated. Cor: PMI nondisplaced. Regular rate & rhythm. No rubs, gallops or murmurs. Lungs: Clear Abdomen: Soft, nontender, nondistended. No hepatosplenomegaly. No bruits or masses. Good bowel sounds. Extremities: No cyanosis, clubbing, rash, LUE VAC Neuro: Alert & orientedx3, cranial nerves grossly intact. moves all 4 extremities w/o difficulty. Affect pleasant   Telemetry   SR 80s personally reviewed.   EKG    N/A   Labs    CBC  Recent Labs  04/11/17 0428 04/12/17 0443  WBC 7.0 6.2  HGB 8.7* 7.8*  HCT 27.9* 24.6*  MCV 86.4 85.7  PLT 52* 67*   Basic Metabolic Panel  Recent Labs  04/11/17 0428 04/12/17 0443  NA 129* 134*  K 3.7 3.5  CL 98* 102  CO2 21* 22  GLUCOSE 117* 100*  BUN 108* 99*  CREATININE 2.78* 2.89*  CALCIUM 8.0* 7.8*  MG 2.3 2.2    Liver Function Tests  Recent Labs  04/09/17 1812  AST 20  ALT 14  ALKPHOS 96  BILITOT <0.1*  PROT 6.2*  ALBUMIN 2.9*   No results for input(s): LIPASE, AMYLASE in the last 72 hours. Cardiac Enzymes No results for input(s): CKTOTAL, CKMB, CKMBINDEX, TROPONINI in the last 72 hours.  BNP: BNP (last 3 results) No results for input(s): BNP in the last 8760 hours.  ProBNP (last 3 results) No results for input(s): PROBNP in the last 8760 hours.   D-Dimer No results for input(s): DDIMER in the last 72 hours. Hemoglobin A1C No results for input(s): HGBA1C in the last 72 hours. Fasting Lipid Panel No results for input(s): CHOL, HDL, LDLCALC, TRIG, CHOLHDL, LDLDIRECT in the last 72 hours. Thyroid Function Tests No results for input(s): TSH, T4TOTAL, T3FREE, THYROIDAB in the last 72 hours.  Invalid input(s): FREET3  Other results:   Imaging     No results found.   Medications:     Scheduled Medications: . fondaparinux  2.5 mg Subcutaneous QODAY  . furosemide  40 mg Oral Daily  . hydrALAZINE  12.5 mg Oral  TID  . Influenza vac split quadrivalent PF  0.5 mL Intramuscular Tomorrow-1000  . isosorbide mononitrate  30 mg Oral Daily  . levothyroxine  88 mcg Oral QAC breakfast  . metoprolol succinate  50 mg Oral BID WC     Infusions: . argatroban 0.8 mcg/kg/min (04/11/17 1634)     PRN Medications:  acetaminophen, HYDROmorphone (DILAUDID) injection, lidocaine, oxyCODONE, senna-docusate, sodium chloride flush    Patient Profile   Mrs [name] is a 71 year old with a history of rheumatic heart disease,  Mechanical MV and TV repair, chronic systolic heart failure, NICM, St Jude CRT-D, CVA,permanenet A Fib, antiphospholipid antibody syndrome, subdural hematoma x2 while on coumadin, ITP, and hypothyroidism admitted optimization by Dr Beryle Beams. Received IVIG x2.   LUE debridement 04/11/2017  Assessment/Plan   1. LUE - Forearm. Extensive necrosis noted. Referred  to Dr Marla Roe on 10/12. S/P  debridement wit A cell and VAC placement.   2. ITP- Per Dr Beryle Beams- receiving IVIG x2. Off Argatroban. Back on fondaparinux per Dr Beryle Beams.   3. Rheumatic Heart Disease: S/P mechanical MVR and TV repair. Currently off fondaparinux. No coumadin with subdural hematoma x2.  Not on asa with low platelets.  4. Chronic Systolic Heart Failure- NICM. St Jude CRT-D. Lexiscan EF 42%.  Repeat echo this admission showed EF 30-35% with stable mechanical mitral valve.  - Mild volume overload => take Lasix 40 mg daily x 4-5 days then back to 20 mg daily.  - Continue current dose of Toprol XL 50 mg twice a day - Continue hydralazine/imdur . No ace/spiro with CKD.  5. CKD Stage IV- Creatinine down from 3>2.89 6.  H/O CVA   Length of Stay: 3  Darrick Grinder, NP  04/12/2017, 10:24 AM  Advanced Heart Failure Team Pager [phone] (M-F; 7a - 4p)  Please contact [facility] for night-coverage after hours (4p -7a ) and weekends on [email or web address]  Patient seen with NP, agree with the above note.   Patient did well with surgery yesterday.  Now back on fondaparinux, plts 67K.    No complaints.  Mild volume overload on exam.  Would have her take Lasix 40 mg daily x 4-5 days then go back to 20 mg daily.  Continue current Toprol XL and hydralazine/Imdur.   BUN/creatinine have trended down.   Possibly home today if home care can be arranged.   Loralie Champagne 04/12/2017 1:37 PM

## 2017-04-12 NOTE — Progress Notes (Signed)
Per MD order, PICC line removed. Cath intact at 39cm. Vaseline pressure gauze to site, pressure held x 5min. No bleeding to site. Pt instructed to keep dressing CDI x 24 hours. Avoid heavy lifting, pushing or pulling x 24 hours,  If bleeding occurs hold pressure, if bleeding does not stop contact MD or go to the ED. Pt does not have any questions. Rockne Dearinger M  

## 2017-04-12 NOTE — Progress Notes (Signed)
Hematology: She is doing well.  Wound dressing is dry.  Platelet count up to 67,000 after 2 doses of IVIG.  Hemoglobin has drifted down after transfusion from 8.7 to 7.8.  White count normal.  Afebrile. Impression: Stable postoperative day 1 extensive debridement of large necrotic wound left forearm in a complicated lady with acute on chronic ITP, antiphospholipid antibody syndrome, and status post mechanical mitral valve replacement. Plan: Discontinue parenteral direct thrombin inhibitor and resume Pentasaccharide subcutaneous anticoagulant fondaparinux. I have discontinued her Promacta. I appreciate excellent care by the house staff.  I will arrange for follow-up in my hematology clinic.

## 2017-04-12 NOTE — Consult Note (Signed)
    [facility] CM Primary Care Navigator  04/12/2017  SERITA DEGROOTE 1946-06-04 [id number]    Met with patient (medical doctor with [facility] for 25 years) and husband Ailene Ravel) at the bedside to identify possible discharge needs.  Husband reports that patient had IV infiltration several weeks ago and now has a wound to left arm which progressively worsened over the past week that resulted to this admission (wound to left forearm needed debridement).  Patient endorses Dr. Josetta Huddle with [facility] at [facility] provider.   [facility] [place] and [place] to obtain medications without any problem. Patient reports managing herownmedications at home withuse of"pill box" system filled weekly.   According to husband, he has been providing transportation to herdoctors' appointments.  Patientverbalized that husband is the primary caregiver at home.   Anticipated discharge plan is home with home health services ([facility]).  Patient and husband expressed understanding to call primary care provider's officewhen she returnshome for a post discharge follow-up appointment within a week or sooner if needed. Patient letter (with PCP's contact number) was provided as areminder.  Explained to patientabout [facility] CM services available for health management at home but she denies any needs or concerns at this time.RN gave instructions and demonstration of wound dressing change to patient and husband. Inpatient CM came in to explain regarding plan for wound care at home.  Patient and husband voiced understanding to seek referral from primary care provider to [facility] care management if necessary and appropriate for services in the future.  [facility] care management information provided for future needs that she may have.  Patienthad verbalized decline of EMMI calls to follow-up withrecovery at home.   For  questions, please contact:  Dannielle Huh, BSN, RN- [facility] Primary Care Navigator  Telephone: [phone] [place]

## 2017-04-12 NOTE — Progress Notes (Addendum)
   Subjective:  Patient seen and examined. She currently has no pain or discomfort in her left arm. She denies shortness of breath or chest pain. She is ready to be discharged.    Objective:  Vital signs in last 24 hours: Vitals:   04/11/17 1824 04/11/17 2050 04/12/17 0504 04/12/17 0908  BP: 114/66 128/75 126/82 124/68  Pulse: 80 86 77 78  Resp: 18 19 20 19   Temp: 98.2 F (36.8 C) 98.7 F (37.1 C) 98.6 F (37 C) 98.4 F (36.9 C)  TempSrc: Oral Oral Oral Oral  SpO2: 94% 93% 98% 100%  Weight:  161 lb 6 oz (73.2 kg)    Height:       General: Laying in bed comfortably, NAD HEENT: [place]/AT, EOMI, no scleral icterus Cardiac: RRR, No R/M/G appreciated, mechanical valve sounds appreciated Pulm: normal effort, CTAB in lateral lung fields Abd: soft, non tender, non distended, BS normal Ext: Left arm wrapped with bandage; bandages appear clean and dry without oozing/bleeding, wound vac placed; trace peripheral edema Neuro: alert and oriented X3, cranial nerves II-XII grossly intact  Assessment/Plan:  Principal Problem:   Arm wound, left, initial encounter Active Problems:   Thrombocytopenia (HCC)   Chronic combined systolic and diastolic congestive heart failure (HCC)   CKD (chronic kidney disease) stage 4, GFR 15-29 ml/min (HCC)   Antiphospholipid antibody with hypercoagulable state ([place])   Atrial fibrillation (HCC)   Chronic anticoagulation   Primary hypercoagulable state ([place])   Chronic ITP (idiopathic thrombocytopenia) (HCC)   S/P MVR (mitral valve replacement)   Anemia, chronic renal failure, stage 4 (severe) (HCC)   History of heparin-induced thrombocytopenia  Left Arm wound Thrombocytopenia Antiphospholipid antibody syndrome Normocytic anemia 2/2 ESRD POD #1 of left arm wound debridement with placement of wound VAC. Pain well controlled. Patient is afebrile without leukocytosis, Hgb 7.8 from 8.7 yesterday. Platelets 67,000.  -Argatroban infusion will be stopped once  patient receives Arixtra dose -Pain control with Oxycodone IR 5 mg q6 hours PRN -Will be discharged home today   HFrEF Repeat ECHO with EF estimated 30-35%, wall motion abnormalities stable from previous. Output of ~ 2L. No shortness of breath or signs of volume overload. -Continue PO Lasix 20 mg oral daily -Metoprolol 50 mg BID -Hydralazine 12.5 mg TID -Imdur 30 mg QD   Chronic, refractory atrial fibrillation  Stable, rate controled. No symptoms or signs of bleeding.  -Will be transitioning back to Arixtra, after receives Arixtra dose argatroban infusion can be stopped -Metoprolol 50 mg BID  CKD Cr stable at 2.89, with GFR of 15, BUN 99 today.    Dispo: Anticipated discharge today.   Melanee Spry, MD 04/12/2017, 10:22 AM Pager: [phone]

## 2017-04-13 NOTE — Care Management (Addendum)
Late Entry:  04/13/2017 Received a call from this pt Dr Kathyrn Drown and her husband Mr Salli Quarry re pt dressing. Pt left the hospital on 04/12/2017 before her VAC arrived with a dressing applied by her RN, Nehemiah Settle per MD instructions. RN instructed the pt and her husband on how to change the dressing and provided supplies .  This CM spoke with pt again and assured her that she and her husband would be able to change the dressing per instructions and that [facility] was not ordered for this dressing change. This CM also offered to bring more supplies to the home. The pt stated that her husband would pick up more supplies tomorrow, she declined more supplies from the hospital .  The pt also stated that [employer] had contacted her re delivery of the [facility], this CM contacted [employer] rep Ezra Sites who stated that The pt should take delivery of the [facility] and take with her to her MD appointment for possible placement . This CM also informed the pt that she would not be charged for the [facility] until it is placed.  Pt provided with this CM phone number for further concerns and questions.  Jasmine Pang RN MPH, case manager  [phone]  4:15 pm This CM spoke with [facility] re this pt. As the pt was not d/c home with [facility] as planned and no orders were provided for dressing changes, orders were not sent ot [facility] 10/18. This CM spoke with intake at [facility] to check on pt until the the pt follows up at her MD office ( Dr Marla Roe). Orders for [facility], were faxed . Again the pt and husband have been trained to change the current dressing. The A Cell is not to be removed until the pt visits her MD.

## 2017-04-14 NOTE — Discharge Summary (Signed)
Name: [name] MRN: [id number] DOB: 05-08-1946 71 y.o. PCP: Cassandra Huddle, MD  Date of Admission: 04/09/2017  3:16 PM Date of Discharge: 04/14/2017 Attending Physician: No att. providers found  Discharge Diagnosis: 1. Left Arm wound Principal Problem:   Arm wound, left, initial encounter Active Problems:   Thrombocytopenia (HCC)   Acute on chronic combined systolic and diastolic CHF (congestive heart failure) (HCC)   CKD (chronic kidney disease) stage 4, GFR 15-29 ml/min (HCC)   Antiphospholipid antibody with hypercoagulable state ([name])   Atrial fibrillation (HCC)   Chronic anticoagulation   Primary hypercoagulable state ([name])   Chronic ITP (idiopathic thrombocytopenia) (HCC)   S/P MVR (mitral valve replacement)   Anemia, chronic renal failure, stage 4 (severe) (HCC)   History of heparin-induced thrombocytopenia   Discharge Medications: Allergies as of 04/12/2017      Reactions   Amiodarone Hcl Other (See Comments)   "torsades; v tach"   Coumadin [warfarin Sodium] Other (See Comments)   "bled into my head"   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   AMOXICILLIN PO Take 4 capsules by mouth as directed. ONLY WHEN VISITING DENTIST   CITRACAL PO Take 1 tablet by mouth daily.   fondaparinux 2.5 MG/0.5ML Soln injection Commonly known as:  ARIXTRA INJECT 0.5ML INTO THE SKIN EVERY OTHER DAY   furosemide 20 MG tablet Commonly known as:  LASIX Take 63m (2 tabs) daily for 4  days then take 273m(1 tab) daily. What changed:  how much to take  how to take this  when to take this  additional instructions   hydrALAZINE 25 MG tablet Commonly known as:  APRESOLINE Take 0.5 tablets (12.5 mg total) by mouth 3 (three) times daily.   isosorbide mononitrate 30 MG 24 hr tablet Commonly known as:  IMDUR Take 1 tablet (30 mg total) by mouth daily.   levothyroxine 88 MCG tablet Commonly known as:  SYNTHROID, LEVOTHROID Take 88 mcg by mouth daily before breakfast.   metoprolol succinate 50 MG 24 hr tablet Commonly known as:  TOPROL-XL TAKE ONE (1) TABLET BY MOUTH TWO (2) TIMES DAILY TAKE WITH OR IMMEDIATELY FOLLOWING A MEAL   Omega-3 Fish Oil 500 MG Caps Take 1 capsule by mouth daily.   oxyCODONE 5 MG immediate release tablet Commonly known as:  ROXICODONE Take 1-2 tablets (5-10 mg total) by mouth every 6 (six) hours as needed for moderate pain or severe pain. What changed:  how much to take  how to take this  when to take this  reasons to take this  additional instructions   silver sulfADIAZINE 1 % cream Commonly known as:  SILVADENE Apply 1 application topically 2 (two) times daily.   TART CHERRY ADVANCED PO Take 2 tablets by mouth daily.   valACYclovir 1000 MG tablet Commonly known as:  VALTREX Take 1,000 mg by mouth 2 (two) times daily. For a total  of 3 doses for labial herpes prn only   Vitamin D 2000 units tablet Take 2,000 Units by mouth daily.   zolpidem 5 MG tablet Commonly known as:  AMBIEN Take 1 tablet (5 mg total) by mouth at bedtime as needed for sleep.       Disposition and follow-up:   Ms.[name] was discharged from [facility] in Stable condition.  At the hospital follow up visit please address:  1.  Left arm wound s/p debridement -Wound care, wound vac -Wound Cultures grew pseudomonas, patient decided to not treat with antibiotic and will tai for appointment with Dr. Marla Conrad -Home  health for wound care -Pain control  -Follow up with DR. Dillingham  2. Chronic Anticoagulation -Bleeding from surgical site  3. CHF -Return to normal lasix dose of 20 mg daily  2.  Labs / imaging needed at time of follow-up: CBC, BMET  3.  Pending labs/ test needing follow-up: None  Follow-up Appointments: Follow-up Information    [name], Cassandra Lofty, DO. Go on 04/19/2017.   Specialty:  Plastic Surgery Why:  9:45 AM Appointment.  Contact information: [place] [address] [phone]        Cassandra Dresser, MD Follow up on 04/24/2017.   Specialty:  Cardiology Why:  at 10:30. Garage Code 8000 Contact information: [place] [address] [place] Hospital Course by problem list: Principal Problem:   Arm wound, left, initial encounter Active Problems:   Thrombocytopenia ([place])   Acute on chronic combined systolic and diastolic CHF (congestive heart failure) (HCC)   CKD (chronic kidney disease) stage 4, GFR 15-29 ml/min (HCC)   Antiphospholipid antibody with hypercoagulable state ([place])   Atrial fibrillation (HCC)   Chronic anticoagulation   Primary hypercoagulable state ([place])   Chronic ITP (idiopathic thrombocytopenia) (HCC)   S/P MVR (mitral valve replacement)   Anemia, chronic renal failure, stage 4 (severe) (HCC)   History of heparin-induced thrombocytopenia   Left arm wound, Thrombocytopenia, Chronic Anticoagulation [name] was directly admitted to [facility] and the Internal Medicine Teaching Service prior to surgery and debridement of her left arm wound sustained after IV removal following a nuclear medicine cardiac stress test. The patient has a complex medical history including thrombocytopenia, CHF, CKD, as well as on chronic anticoagulation for atrial fibrillation and mitral valve replacement. She required close monitoring and medical optimization prior to surgery. The  patient was hemodynamically stable on arrival to the hospital. An antecubital PICC line was placed without complication.  On admission, her CBC was remarkable for hemoglobin of 8.0, which trended down to 7.3 the following day, and platelet count of 19,000. She required one unit of pRBCs and two IVIG transfusions prior to surgery. She was given IV lasix after her blood transfusion because of her CHF history. She tolerated the IVIG and blood transfusion well with no complications or signs of volume overload.  She was also transitioned from Arixtra to an Argatroban infusion for anticoagulation. She had no bleeding or clotting events while hospitalized. She was restarted on Arixtra at discharge. The patient was discharged with instructions to stop taking Promacta.   Prior to surgery her platelets increased to 52,000. The skin, soft tissue, and fat involved was excised, followed by wound vac and A cell placment. The patient did not experience complications during or after the procedure. Her pain was well controlled. The wound  cultures grew Pseudomonas Aeruginosa sensitive to Ciprofloxacin. The patient was updated after discharge and opted to get her Plastic Surgeons opinion about antibiotic treatment, which is reasonable.   2. Chronic Systolic Congestive Heart Failure The patient remained euvolemic and had no signs or symptoms of volume overload on physical examination. Because of the patient's complex cardiac history, Cardiology was consulted to monitor the patient perioperatively. After surgery an echocardiogram was performed which was stable from prior. Please see study results below. Dr. Aundra Dubin discharged the patient with instruction to take 40 mg daily of lasix for 4-5 days.   3. CKD, Anemia of Chronic Disease Patient's creatinine and BUN remained stable throughout hospitalization. She received one unit of blood prior to surgery and hemoglobin stabilized to the patient's baseline of 8.   Discharge  Vitals:   BP 124/68   Pulse 78   Temp 98.4 F (36.9 C) (Oral)   Resp 19   Ht _0  (1.6 m)   Wt 161 lb 6 oz (73.2 kg)   SpO2 100%   BMI 28.59 kg/m   Pertinent Labs, Studies, and Procedures:  CBC Latest Ref Rng & Units 04/12/2017 04/11/2017 04/10/2017  WBC 4.0 - 10.5 K/uL 6.2 7.0 10.0  Hemoglobin 12.0 - 15.0 g/dL 7.8(L) 8.7(L) 7.3(L)  Hematocrit 36.0 - 46.0 % 24.6(L) 27.9(L) 23.1(L)  Platelets 150 - 400 K/uL 67(L) 52(L) 24(LL)   BMP Latest Ref Rng & Units 04/12/2017 04/11/2017 04/10/2017  Glucose 65 - 99 mg/dL 100(H) 117(H) 137(H)  BUN 6 - 20 mg/dL 99(H) 108(H) 108(H)  Creatinine 0.44 - 1.00 mg/dL 2.89(H) 2.78(H) 3.02(H)  BUN/Creat Ratio 12 - 28 - - -  Sodium 135 - 145 mmol/L 134(L) 129(L) 131(L)  Potassium 3.5 - 5.1 mmol/L 3.5 3.7 3.5  Chloride 101 - 111 mmol/L 102 98(L) 100(L)  CO2 22 - 32 mmol/L 22 21(L) 21(L)  Calcium 8.9 - 10.3 mg/dL 7.8(L) 8.0(L) 7.9(L)   Transthoracic Echocardiogram Study Conclusions - Left ventricle: The cavity size was normal. Wall thickness was increased in a pattern of mild LVH. Systolic function was moderately to severely reduced. The estimated ejection fraction was in the range of 30% to 35%. There is hypokinesis of the anteroseptal and apical myocardium. - Aortic valve: There was mild regurgitation. - Mitral valve: A mechanical prosthesis was present. Valve area by   continuity equation (using LVOT flow): 1.17 cm^2. - Left atrium: The atrium was severely dilated. - Right atrium: The atrium was mildly dilated. - Tricuspid valve: There was moderate regurgitation. - Pulmonary arteries: PA peak pressure: 54 mm Hg (S). Impressions: - Hypokinesis of the anteroseptal and apical walls with moderate to   severe LV dysfunction; mild LVH; mild AI; s/p MVR with normal   gradients; biatrial enlargement; moderate TR with moderately   elevated pulmonary pressure.   Discharge Instructions: Discharge Instructions    Call MD for:  redness, tenderness, or  signs of infection (pain, swelling, redness, odor or green/yellow discharge around incision site)    Complete by:  As directed    Call MD for:  severe uncontrolled pain    Complete by:  As directed    Call MD for:  temperature >100.4    Complete by:  As directed    Diet - low sodium heart healthy    Complete by:  As directed    Discharge instructions    Complete by:  As directed    Dr. Kathyrn Drown,   It was a pleasure meeting you and I am happy the  surgery went well.   An order for home health and wound care was placed and they should be in contact with you. Your first wound vac change will be with Dr. Marla Conrad next week, then home care will start doing the wound vac changes.   I have written you a prescription for pain medicine.  Per Dr. Darnell Level the Promacta was stopped.   Dr. Aundra Dubin asks that you take lasix 67m (2 tabs) once a day for 4 to 5 days then go back to your regular dose of 280mdaily.   Increase activity slowly    Complete by:  As directed       Signed: LaMelanee SpryMD 04/14/2017, 1:31 PM   Pager: [phone]

## 2017-04-16 LAB — AEROBIC/ANAEROBIC CULTURE W GRAM STAIN (SURGICAL/DEEP WOUND)

## 2017-04-16 LAB — AEROBIC/ANAEROBIC CULTURE (SURGICAL/DEEP WOUND)

## 2017-04-17 ENCOUNTER — Telehealth: Payer: Self-pay | Admitting: Radiology

## 2017-04-17 ENCOUNTER — Other Ambulatory Visit: Payer: Self-pay | Admitting: Internal Medicine

## 2017-04-17 ENCOUNTER — Inpatient Hospital Stay (HOSPITAL_COMMUNITY)
Admission: EM | Admit: 2017-04-17 | Discharge: 2017-04-26 | DRG: 871 | Disposition: E | Payer: Medicare Other | Attending: Internal Medicine | Admitting: Internal Medicine

## 2017-04-17 ENCOUNTER — Emergency Department (HOSPITAL_COMMUNITY): Payer: Medicare Other

## 2017-04-17 ENCOUNTER — Encounter (HOSPITAL_COMMUNITY): Payer: Self-pay | Admitting: Pharmacy Technician

## 2017-04-17 ENCOUNTER — Telehealth: Payer: Self-pay | Admitting: *Deleted

## 2017-04-17 ENCOUNTER — Inpatient Hospital Stay (HOSPITAL_COMMUNITY): Payer: Medicare Other

## 2017-04-17 ENCOUNTER — Other Ambulatory Visit: Payer: Self-pay | Admitting: Nurse Practitioner

## 2017-04-17 ENCOUNTER — Other Ambulatory Visit: Payer: Self-pay

## 2017-04-17 ENCOUNTER — Ambulatory Visit
Admission: RE | Admit: 2017-04-17 | Discharge: 2017-04-17 | Disposition: A | Discharge status: Home / Self Care | Payer: Medicare Other | Source: Ambulatory Visit | Attending: Nurse Practitioner | Admitting: Nurse Practitioner

## 2017-04-17 DIAGNOSIS — A419 Sepsis, unspecified organism: Secondary | ICD-10-CM

## 2017-04-17 DIAGNOSIS — I214 Non-ST elevation (NSTEMI) myocardial infarction: Secondary | ICD-10-CM | POA: Diagnosis not present

## 2017-04-17 DIAGNOSIS — R6521 Severe sepsis with septic shock: Secondary | ICD-10-CM

## 2017-04-17 DIAGNOSIS — E872 Acidosis: Secondary | ICD-10-CM | POA: Diagnosis present

## 2017-04-17 DIAGNOSIS — R531 Weakness: Secondary | ICD-10-CM | POA: Diagnosis not present

## 2017-04-17 DIAGNOSIS — Z452 Encounter for adjustment and management of vascular access device: Secondary | ICD-10-CM

## 2017-04-17 DIAGNOSIS — E875 Hyperkalemia: Secondary | ICD-10-CM | POA: Diagnosis present

## 2017-04-17 DIAGNOSIS — N170 Acute kidney failure with tubular necrosis: Secondary | ICD-10-CM | POA: Diagnosis present

## 2017-04-17 DIAGNOSIS — E785 Hyperlipidemia, unspecified: Secondary | ICD-10-CM | POA: Diagnosis present

## 2017-04-17 DIAGNOSIS — R0602 Shortness of breath: Secondary | ICD-10-CM

## 2017-04-17 DIAGNOSIS — I482 Chronic atrial fibrillation: Secondary | ICD-10-CM | POA: Diagnosis present

## 2017-04-17 DIAGNOSIS — I129 Hypertensive chronic kidney disease with stage 1 through stage 4 chronic kidney disease, or unspecified chronic kidney disease: Secondary | ICD-10-CM | POA: Diagnosis not present

## 2017-04-17 DIAGNOSIS — Z950 Presence of cardiac pacemaker: Secondary | ICD-10-CM | POA: Diagnosis not present

## 2017-04-17 DIAGNOSIS — Z90710 Acquired absence of both cervix and uterus: Secondary | ICD-10-CM

## 2017-04-17 DIAGNOSIS — A4152 Sepsis due to Pseudomonas: Secondary | ICD-10-CM | POA: Diagnosis not present

## 2017-04-17 DIAGNOSIS — D6861 Antiphospholipid syndrome: Secondary | ICD-10-CM | POA: Diagnosis present

## 2017-04-17 DIAGNOSIS — Z79890 Hormone replacement therapy (postmenopausal): Secondary | ICD-10-CM

## 2017-04-17 DIAGNOSIS — T148XXA Other injury of unspecified body region, initial encounter: Secondary | ICD-10-CM | POA: Diagnosis not present

## 2017-04-17 DIAGNOSIS — N179 Acute kidney failure, unspecified: Secondary | ICD-10-CM

## 2017-04-17 DIAGNOSIS — Z66 Do not resuscitate: Secondary | ICD-10-CM | POA: Diagnosis present

## 2017-04-17 DIAGNOSIS — Z91041 Radiographic dye allergy status: Secondary | ICD-10-CM

## 2017-04-17 DIAGNOSIS — B965 Pseudomonas (aeruginosa) (mallei) (pseudomallei) as the cause of diseases classified elsewhere: Secondary | ICD-10-CM | POA: Diagnosis present

## 2017-04-17 DIAGNOSIS — Z952 Presence of prosthetic heart valve: Secondary | ICD-10-CM

## 2017-04-17 DIAGNOSIS — D631 Anemia in chronic kidney disease: Secondary | ICD-10-CM | POA: Diagnosis present

## 2017-04-17 DIAGNOSIS — L089 Local infection of the skin and subcutaneous tissue, unspecified: Secondary | ICD-10-CM

## 2017-04-17 DIAGNOSIS — E063 Autoimmune thyroiditis: Secondary | ICD-10-CM | POA: Diagnosis present

## 2017-04-17 DIAGNOSIS — R001 Bradycardia, unspecified: Secondary | ICD-10-CM | POA: Diagnosis present

## 2017-04-17 DIAGNOSIS — Z888 Allergy status to other drugs, medicaments and biological substances status: Secondary | ICD-10-CM

## 2017-04-17 DIAGNOSIS — I69354 Hemiplegia and hemiparesis following cerebral infarction affecting left non-dominant side: Secondary | ICD-10-CM

## 2017-04-17 DIAGNOSIS — D693 Immune thrombocytopenic purpura: Secondary | ICD-10-CM | POA: Diagnosis present

## 2017-04-17 DIAGNOSIS — R570 Cardiogenic shock: Secondary | ICD-10-CM | POA: Diagnosis present

## 2017-04-17 DIAGNOSIS — I517 Cardiomegaly: Secondary | ICD-10-CM | POA: Diagnosis not present

## 2017-04-17 DIAGNOSIS — I484 Atypical atrial flutter: Secondary | ICD-10-CM | POA: Diagnosis present

## 2017-04-17 DIAGNOSIS — R404 Transient alteration of awareness: Secondary | ICD-10-CM | POA: Diagnosis not present

## 2017-04-17 DIAGNOSIS — I080 Rheumatic disorders of both mitral and aortic valves: Secondary | ICD-10-CM | POA: Diagnosis not present

## 2017-04-17 DIAGNOSIS — J9601 Acute respiratory failure with hypoxia: Secondary | ICD-10-CM | POA: Diagnosis present

## 2017-04-17 DIAGNOSIS — I130 Hypertensive heart and chronic kidney disease with heart failure and stage 1 through stage 4 chronic kidney disease, or unspecified chronic kidney disease: Secondary | ICD-10-CM | POA: Diagnosis present

## 2017-04-17 DIAGNOSIS — I5042 Chronic combined systolic (congestive) and diastolic (congestive) heart failure: Secondary | ICD-10-CM | POA: Diagnosis not present

## 2017-04-17 DIAGNOSIS — N184 Chronic kidney disease, stage 4 (severe): Secondary | ICD-10-CM | POA: Diagnosis present

## 2017-04-17 DIAGNOSIS — I471 Supraventricular tachycardia: Secondary | ICD-10-CM | POA: Diagnosis not present

## 2017-04-17 DIAGNOSIS — I5023 Acute on chronic systolic (congestive) heart failure: Secondary | ICD-10-CM | POA: Diagnosis present

## 2017-04-17 DIAGNOSIS — Z79899 Other long term (current) drug therapy: Secondary | ICD-10-CM

## 2017-04-17 DIAGNOSIS — Z87891 Personal history of nicotine dependence: Secondary | ICD-10-CM

## 2017-04-17 DIAGNOSIS — I447 Left bundle-branch block, unspecified: Secondary | ICD-10-CM | POA: Diagnosis present

## 2017-04-17 LAB — GLUCOSE, CAPILLARY: Glucose-Capillary: 129 mg/dL — ABNORMAL HIGH (ref 65–99)

## 2017-04-17 LAB — TROPONIN I: TROPONIN I: 24.93 ng/mL — AB (ref <0.03)

## 2017-04-17 LAB — PROTIME-INR
INR: 1.27
PROTHROMBIN TIME: 15.8 s — AB (ref 11.4–15.2)

## 2017-04-17 LAB — I-STAT CG4 LACTIC ACID, ED: Lactic Acid, Venous: 5.13 mmol/L (ref 0.5–1.9)

## 2017-04-17 MED ORDER — LEVOTHYROXINE SODIUM 88 MCG PO TABS
88.0000 ug | ORAL_TABLET | Freq: Every day | ORAL | Status: DC
  Filled 2017-04-17: qty 1

## 2017-04-17 MED ORDER — VANCOMYCIN HCL 10 G IV SOLR
1500.0000 mg | Freq: Once | INTRAVENOUS | Status: AC
  Administered 2017-04-17: 1500 mg via INTRAVENOUS
  Filled 2017-04-17: qty 1500

## 2017-04-17 MED ORDER — SODIUM CHLORIDE 0.9 % IV SOLN: 250.0000 mL | INTRAVENOUS | Status: DC | PRN

## 2017-04-17 MED ORDER — TECHNETIUM TC 99M DIETHYLENETRIAME-PENTAACETIC ACID
31.8000 | Freq: Once | INTRAVENOUS | Status: AC | PRN
  Administered 2017-04-17: 31.8 via RESPIRATORY_TRACT

## 2017-04-17 MED ORDER — PHENYLEPHRINE HCL 10 MG/ML IJ SOLN
0.0000 ug/min | INTRAMUSCULAR | Status: DC
  Administered 2017-04-17: 20 ug/min via INTRAVENOUS
  Filled 2017-04-17: qty 1

## 2017-04-17 MED ORDER — DEXTROSE 5 % IV SOLN
2.0000 g | Freq: Once | INTRAVENOUS | Status: AC
  Administered 2017-04-17: 2 g via INTRAVENOUS
  Filled 2017-04-17: qty 2

## 2017-04-17 MED ORDER — SODIUM CHLORIDE 0.9 % IV BOLUS (SEPSIS)
500.0000 mL | Freq: Once | INTRAVENOUS | Status: AC
  Administered 2017-04-17: 500 mL via INTRAVENOUS

## 2017-04-17 MED ORDER — SODIUM CHLORIDE 0.9 % IV SOLN
INTRAVENOUS | Status: DC
Start: 2017-04-17 — End: 2017-04-18

## 2017-04-17 MED ORDER — ACETAMINOPHEN 325 MG PO TABS
650.0000 mg | ORAL_TABLET | Freq: Once | ORAL | Status: AC
  Administered 2017-04-17: 650 mg via ORAL

## 2017-04-17 MED ORDER — TECHNETIUM TO 99M ALBUMIN AGGREGATED
4.2000 | Freq: Once | INTRAVENOUS | Status: AC | PRN
  Administered 2017-04-17: 4.2 via INTRAVENOUS

## 2017-04-17 NOTE — ED Notes (Signed)
I stat lactic results given to Dr. Eulis Foster by B. Yolanda Bonine, EMT

## 2017-04-17 NOTE — H&P (Signed)
PULMONARY / CRITICAL CARE MEDICINE   Name: [name] MRN: [id number] DOB: 1945-08-29    ADMISSION DATE:  04/14/2017 CONSULTATION DATE:  04/17/2017  REFERRING MD:  Dr. 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 Patient reports that she underwent cardiac stress test 9/18, after in which she developed a skin tear on the left forearm after removal of IV. The wound increased in size with swelling around the forearm and hand. Saw Dr. Beryle Beams and was treated with a course of steroids and antibiotics. Required debridement on 10/17. Wound cultures grew pseudomonas, patient decided not to treat with antibiotics until follow up appointment with Dr. Marla Roe. Since discharge on 10/20 patient reports that she has been progressively weak and tired.  In ED, Temp 144.3, BP Systolic 15Q, Troponin 00.86, LA 5.13. Difficult IV access unable to obtain additional labs. Cardiology consulted.   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  PAST SURGICAL HISTORY: She  has a past surgical history that includes Mitral valve replacement (2007); Amputation; Craniotomy (2008); Cholecystectomy; Tonsillectomy; Percutaneous balloon valvuloplasty (1995); Atrial ablation surgery; Cardiac valve replacement; Tonsillectomy; Total abdominal hysterectomy (?1995); biventricular pacemaker placement (03/28/2012); Cardioversion (2010-2011); Cardiac electrophysiology mapping and ablation (2012); bi-ventricular pacemaker insertion (N/A, 03/28/2012); IR Fluoro Guide CV Midline PICC Right (04/09/2017); IR US Guide Vasc Access Right (04/09/2017); I&D extremity (Left, 50/93/2671); and Application if wound vac (Left, 04/11/2017).  Allergies  Allergen Reactions  . Amiodarone Hcl Other (See Comments)    "torsades; v tach"  . Coumadin [Warfarin Sodium] Other (See Comments)    "bled into my head"  . 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 Vitamin K Anaphylaxis    IV only allergy  . Iodinated Diagnostic Agents Itching    "over 35 years ago" (03/28/2012)  . Metrizamide Itching    "over 35 years ago" (03/28/2012)    No current facility-administered medications on file prior to encounter.    Current Outpatient Prescriptions on File Prior to Encounter  Medication Sig  . acetaminophen (TYLENOL) 650 MG CR tablet Take 1,300 mg by mouth 3 (three) times daily as needed for pain.   . Calcium Citrate (CITRACAL PO) Take 1 tablet by mouth daily.  . Cholecalciferol (VITAMIN D) 2000 UNITS tablet Take 2,000 Units by mouth daily.    . fondaparinux (ARIXTRA) 2.5 MG/0.5ML SOLN injection INJECT 0.5ML INTO THE SKIN EVERY OTHER DAY  . furosemide (LASIX) 20 MG tablet Take 40mg  (2 tabs) daily for 4 days then take 20mg  (1 tab) daily. (Patient taking differently: Take 20 mg by mouth daily. )  . hydrALAZINE (APRESOLINE) 25 MG tablet Take 0.5 tablets (12.5 mg  total) by mouth  3 (three) times daily.  . isosorbide mononitrate (IMDUR) 30 MG 24 hr tablet Take 1 tablet (30 mg total) by mouth daily.  Marland Kitchen levothyroxine (SYNTHROID, LEVOTHROID) 88 MCG tablet Take 88 mcg by mouth daily before breakfast.  . metoprolol succinate (TOPROL-XL) 50 MG 24 hr tablet TAKE ONE (1) TABLET BY MOUTH TWO (2) TIMES DAILY TAKE WITH OR IMMEDIATELY FOLLOWING A MEAL (Patient taking differently: TAKE ONE 50 mg TABLET BY MOUTH TWO (2) TIMES DAILY TAKE WITH OR IMMEDIATELY FOLLOWING A MEAL)  . Misc Natural Products (TART CHERRY ADVANCED PO) Take 2 tablets by mouth daily.  Marland Kitchen oxyCODONE (ROXICODONE) 5 MG immediate release tablet Take 1-2 tablets (5-10 mg total) by mouth every 6 (six) hours as needed for moderate pain or severe pain.  . valACYclovir (VALTREX) 1000 MG tablet Take 1,000 mg by mouth as needed. For a total of 3 doses for labial herpes prn only   . zolpidem (AMBIEN) 5 MG tablet Take 1 tablet (5 mg total) by mouth at bedtime as needed for sleep.    FAMILY HISTORY:  Her indicated that her mother is deceased. She indicated that her father is deceased. She indicated that both of her brothers are alive. She indicated that her maternal grandmother is deceased. She indicated that her maternal grandfather is deceased. She indicated that her paternal grandmother is deceased. She indicated that her paternal grandfather is deceased.    SOCIAL HISTORY: She  reports that she quit smoking about 25 years ago. Her smoking use included Cigarettes. She has a 5.00 pack-year smoking history. She has never used smokeless tobacco. She reports that she drinks alcohol. She reports that she does not use drugs.  REVIEW OF SYSTEMS:   All negative; except for those that are bolded, which indicate positives.  Constitutional: weight loss, weight gain, night sweats, fevers, chills, fatigue, weakness.  HEENT: headaches, sore throat, sneezing, nasal congestion, post nasal drip, difficulty swallowing, tooth/dental problems,  visual complaints, visual changes, ear aches. Neuro: difficulty with speech, weakness, numbness, ataxia. CV:  chest pain, orthopnea, PND, swelling in lower extremities, dizziness, palpitations, syncope.  Resp: cough, hemoptysis, dyspnea, wheezing. GI: heartburn, indigestion, abdominal pain, nausea, vomiting, diarrhea, constipation, change in bowel habits, loss of appetite, hematemesis, melena, hematochezia.  GU: dysuria, change in color of urine, urgency or frequency, flank pain, hematuria. MSK: joint pain or swelling, decreased range of motion. Psych: change in mood or affect, depression, anxiety, suicidal ideations, homicidal ideations. Skin: rash, itching, bruising.   SUBJECTIVE:   VITAL SIGNS: BP (!) 83/57   Pulse 97   Temp (!) 100.6 F (38.1 C) (Rectal)   Resp 20   SpO2 100%   HEMODYNAMICS:    VENTILATOR SETTINGS:    INTAKE / OUTPUT: No intake/output data recorded.  PHYSICAL EXAMINATION: General:  Ill adult female, no distress  Neuro:  Alert, oriented, follows commands  HEENT:  Dry MM  Cardiovascular:  Irregular, V paced   Lungs:  Clear breath sounds, non-labored  Abdomen:  Obese, active bowel sounds  Musculoskeletal:  +1 BLE  Skin:  Warm, dry, intact   LABS:  BMET  Recent Labs Lab 04/11/17 0428 04/12/17 0443  NA 129* 134*  K 3.7 3.5  CL 98* 102  CO2 21* 22  BUN 108* 99*  CREATININE 2.78* 2.89*  GLUCOSE 117* 100*    Electrolytes  Recent Labs Lab 04/11/17 0428 04/12/17 0443  CALCIUM 8.0* 7.8*  MG 2.3 2.2    CBC  Recent Labs Lab 04/11/17 0428  04/12/17 0443  WBC 7.0 6.2  HGB 8.7* 7.8*  HCT 27.9* 24.6*  PLT 52* 67*    Coag's  Recent Labs Lab 04/11/17 1248 04/11/17 1954 04/12/17 0443 03/30/2017 1840  APTT 85* 81* 92*  --   INR  --   --   --  1.27    Sepsis Markers  Recent Labs Lab 04/11/2017 1847  LATICACIDVEN 5.13*    ABG No results for input(s): PHART, PCO2ART, PO2ART in the last 168 hours.  Liver Enzymes No results  for input(s): AST, ALT, ALKPHOS, BILITOT, ALBUMIN in the last 168 hours.  Cardiac Enzymes  Recent Labs Lab 03/31/2017 1840  TROPONINI 24.93*    Glucose No results for input(s): GLUCAP in the last 168 hours.  Imaging Dg Chest 2 View  Result Date: 04/24/2017 CLINICAL DATA:  Shortness of Breath EXAM: CHEST  2 VIEW COMPARISON:  03/29/2022 FINDINGS: Cardiac shadow is enlarged. Postsurgical changes are noted. Pacing device is again seen and stable. No focal infiltrate or sizable effusion is seen. No bony abnormality is noted. IMPRESSION: No active cardiopulmonary disease. Electronically Signed   By: Inez Catalina M.D.   On: 04/04/2017 12:46   Nm Pulmonary Vent And Perf (v/q Scan)  Result Date: 04/15/2017 CLINICAL DATA:  Status post surgery with difficulty breathing and shortness of breath EXAM: NUCLEAR MEDICINE VENTILATION - PERFUSION LUNG SCAN TECHNIQUE: Ventilation images were obtained in multiple projections using inhaled aerosol Tc-74m DTPA. Perfusion images were obtained in multiple projections after intravenous injection of Tc-26m MAA. Technologist reports that no lateral views were done secondary to recent left arm surgery. RADIOPHARMACEUTICALS:  31.8 mCi Technetium-44m DTPA aerosol inhalation and 4.2 mCi Technetium-71m MAA IV COMPARISON:  Radiograph 03/31/2017 FINDINGS: Ventilation: Mildly heterogeneous ventilation. Perfusion: No wedge shaped peripheral perfusion defects to suggest acute pulmonary embolism. Cardiomegaly. IMPRESSION: No significant perfusion defects allowing for absence of lateral views for reasons above. Overall very low probability for pulmonary embolism (cardiolegaly) . Electronically Signed   By: Donavan Foil M.D.   On: 04/12/2017 21:09   Dg Chest Port 1 View  Result Date: 04/24/2017 CLINICAL DATA:  Acute onset of generalized weakness and exertional shortness of breath. Elevated D-dimer and elevated troponin. Initial encounter. EXAM: PORTABLE CHEST 1 VIEW COMPARISON:   Chest radiograph performed earlier today at 12:30 p.m. FINDINGS: The lungs are well-aerated and clear. There is no evidence of focal opacification, pleural effusion or pneumothorax. The cardiomediastinal silhouette is mildly enlarged. The patient is status post median sternotomy. A pacemaker is noted overlying the left chest wall, with leads ending overlying the right atrium, right ventricle and coronary sinus. No acute osseous abnormalities are seen. IMPRESSION: Mild cardiomegaly.  Lungs remain grossly clear. Electronically Signed   By: Garald Balding M.D.   On: 04/25/2017 21:30     STUDIES:  CXR 10/23 > No acute  NM PE 10/23 > Low Prob   CULTURES: Blood 10/23 >>  ANTIBIOTICS: Vancomycin 10/23 >> Cefepime 10/23 >>   SIGNIFICANT EVENTS: 10/17 > I&D of left forearm  10/23 > Presents to ED   LINES/TUBES: PIV   DISCUSSION: 71 year old female with extensive PMH and recent I&D of left forearm wound +Pseduomonas presents septic.   ASSESSMENT / PLAN:  PULMONARY A: Respiratory Insufficiency in setting of sepsis  -CXR no acute, oxygenation WNL > patient feels short of breath, no tachypnea  P:   Maintain Oxygen Saturation >92 Pulmonary Hygiene  Trend CXR ABG   CARDIOVASCULAR A:  Septic Shock in setting of Pseudomonas  Elevated Troponin in setting of demand ischemia -no chest pain, no EKG changes, not candidate for cath at this time per cardiology  Systolic HF (EF 11-91) - Followed by Dr. Aundra Dubin  Rheumatic Heart Disease s/p MV and TV repair  A.Fib  Symptomatic Bradycardia s/p biventricular pacemaker  H/O HTN, HLD  P:  Cardiac Monitoring  Maintain MAP >65  Place CVC, Trend CVP  Neo if needed to maintain MAP goal  Hold lasix, hydralazine, metoprolol, Imdur    RENAL A:   Lactic Acidosis  -Labs are still pending CKD Stage 3  -Does not want HD either short or long term  P:   Trend BMP Replace Electrolytes as needed Trend LA  Gentle with fluids given HF    GASTROINTESTINAL A:   No issues  P:   NPO  HEMATOLOGIC A:   Anemia of Chronic illness  Antiphospholipid Antibody Syndrome  ITP    H/O HIT  P:  Trend CBC  Maintain Hbg >7  Per Hematology 10/17 Promacta d/c, due to renal function will hold fondaparinux and start argatroban   INFECTIOUS A:   LUE Wound s/p I&D +Pseudonmonas  P:   Trend WBC and Fever Curve Follow Culture Data  Cefepime and Vancomycin  Trend PCT and LA   ENDOCRINE A:   H/O Hashimoto Thyroiditis    P:   Trend Glucose  Continue Synthroid   NEUROLOGIC A:   H/O SDH (2008), CVA  P:   Monitor  Avoid sedative medications    FAMILY  - Updates: Friend at bedside updated on plan of care. Patient reports that she does not wish to undergo intubation/cpr/or HD. Code    - Inter-disciplinary family meet or Palliative Care meeting due by: 10/30    CC Time: 64 minutes  Hayden Pedro, AGACNP-BC [place]  Pgr: [phone]  PCCM Pgr: [phone]

## 2017-04-17 NOTE — Telephone Encounter (Signed)
Message from [facility] - states Prior Authorization for [name] has been received and is pending ; review takes 24-72 hrs.

## 2017-04-17 NOTE — Telephone Encounter (Signed)
Left message on both numbers listed. Unsure if this was here phone. Left very generic message to call and talk to me. 13 hour prep called to [facility] and instructions should be on medication bottle.

## 2017-04-17 NOTE — Procedures (Signed)
Central Venous Catheter Insertion Procedure Note [name] [id number] 1945-08-25  Procedure: Insertion of Central Venous Catheter Indications: Assessment of intravascular volume, Drug and/or fluid administration and Frequent blood sampling  Procedure Details Consent: Risks of procedure as well as the alternatives and risks of each were explained to the (patient/caregiver).  Consent for procedure obtained. Time Out: Verified patient identification, verified procedure, site/side was marked, verified correct patient position, special equipment/implants available, medications/allergies/relevent history reviewed, required imaging and test results available. Time out performed   Maximum sterile technique was used including antiseptics, cap, gloves, gown, hand hygiene, mask and sheet. Skin prep: Chlorhexidine; local anesthetic administered A antimicrobial bonded/coated triple lumen catheter was placed in the RIJ using the Seldinger technique.  Evaluation Blood flow good Complications: No apparent complications Patient did tolerate procedure well. Chest X-ray ordered to verify placement.  CXR: normal.  Sharyn Blitz [name] 04/22/2017, 11:02 PM

## 2017-04-17 NOTE — Progress Notes (Signed)
Pharmacy Antibiotic Note  [name] is a 71 y.o. female admitted on 04/03/2017 with sepsis.  Pharmacy has been consulted for vancomycin and cefepime dosing. LA 5.13, Temp 100.6. Previous culture of left arm wound grew pseudomonas (10/17). Patient has altered mental status. No CBC or CMP ordered.   Plan: Vancomycin 1500mg  IV X1. Cefepime 2g IV X1. Will f/u BMET for maintenance doses.  Monitor for Scr, c/s, clinical resolution. F/u de-escalation plan/LOT, vancomycin trough as indicated     Temp (24hrs), Avg:99.4 F (37.4 C), Min:98.2 F (36.8 C), Max:100.6 F (38.1 C)   Recent Labs Lab 04/11/17 0428 04/12/17 0443 03/27/2017 1847  WBC 7.0 6.2  --   CREATININE 2.78* 2.89*  --   LATICACIDVEN  --   --  5.13*    Estimated Creatinine Clearance: 17.4 mL/min (A) (by C-G formula based on SCr of 2.89 mg/dL (H)).    Allergies  Allergen Reactions  . Amiodarone Hcl Other (See Comments)    "torsades; v tach"  . Coumadin [Warfarin Sodium] Other (See Comments)    "bled into my head"  . 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 Vitamin K Anaphylaxis    IV only allergy  . Iodinated Diagnostic Agents Itching    "over 35 years ago" (03/28/2012)  . Metrizamide Itching    "over 35 years ago" (03/28/2012)     Thank you for allowing pharmacy to be a part of this patient's care.  Jerrye Noble, PharmD Candidate 04/06/2017 7:02 PM

## 2017-04-17 NOTE — Telephone Encounter (Signed)
Pt's husband called and stated they had picked up 13 hour prep and understood the instructions as given by the pharmacy. Asked him to call if he had any questioned.

## 2017-04-17 NOTE — Consult Note (Signed)
Advanced Heart Failure Team Consult Note   HF Cardiology: Aundra Dubin  Reason for Consultation: Shock, elevated troponin  HPI:    [name] is seen today for evaluation of hypotension/elevated troponin at the request of Dr. Eulis Foster.   She is a 71 y.o. female with extensive past history including mechanical MV and TV repair, chronic systolic CHF with EF 98-26%, chronic atrial fibrillation, prior CVA, antiphospholipid antibody syndrome, subdural hematomas x 2, and CKD presents for followup of CHF.  Patient had initial valvuloplasty at [facility] in 1995.  She later had mechanical MV replacement and TV repair complicated by CVA with left hemiparesis.  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  She is anticoagulated now with fondaparinux as she had subdural hematomas x 2 while on coumadin.  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 She has permanent atrial fibrillation (has failed cardioversion, cannot get amiodarone as she had torsades in the past with amiodarone use).  She had an adenosine Cardiolite in 10/15 that showed EF 35% with fixed defect, no ischemia. This was thought to be scar versus pacing artifact.  Initially seen in HF clinic 05/2014. Referred back by Dr. Stanford Breed due to drop in EF in 8/18.  8/18 echo showed fall in EF from  35-40% range to 10-15% range.   She was doing well until around 4/18.  She was walking 3 miles/day without problems. In 4/18, she was started on prednisone for ITP and later romiplostim.  She developed profound exhaustion/fatigue to the point where romiplostim was stopped.  After stopping this medication (now taking Promacta to stimulate platelet production), she felt better.  While on romiplostim, she developed presyncopal spells.  Device interrogation and event monitor did not show an arrhythmic cause. Since stopping romiplostim, no further presyncopal events.  Given profound fall in EF, I had her do a Agricultural consultant.  This was done in 9/18 and showed EF  42% with a fixed defect but no ischemia.  After the cardiolite, she had a skin tear when removing the IV from the left forearm.  She developed a large ulceration on the left forearm that increased in size with swelling of the left forearm and hand.  She saw Dr. Beryle Beams and was treated with a course of steroids and antibiotics.  She was ultimately admitted with debridement in the OR on 10/17 then discharge on 10/18.  She was not discharged on antibiotics.  Pseudomonas grew from her wound.    Since discharge last week, she has felt weak with lack of energy and poor appetite.  She has not wanted to get out of bed.  She saw her PCP today who noted rise in creatinine from 2.89 to 3.5 as well as low blood pressure.  The arm wound looked worse with green drainage.  She was sent to the ER.  In the ER, she has been hypotensive with SBP down to 50s.  This responded to IV fluid, SBP now in 80s-90s.  IV access has been difficult, she only has 1 IV.  She was noted to be febrile to 100.6.  WBCs 14 (normally she is leukopenic).  Lactate 5.13.  Troponin was noted to be elevated to 25.  She has had no chest pain.  She is not particularly short of breath (just fatigued) but has really not been out of bed. ECG showed atrial fibrillation with RVR and LBBB at admission.    Review of Systems: All systems reviewed and negative except  as noted in HPI.   Home Medications Prior to Admission medications   Medication Sig Start Date End Date Taking? Authorizing Provider  acetaminophen (TYLENOL) 650 MG CR tablet Take 1,300 mg by mouth 3 (three) times daily as needed for pain.    Yes [provider]  Calcium Citrate (CITRACAL PO) Take 1 tablet by mouth daily.   Yes [provider]  Cholecalciferol (VITAMIN D) 2000 UNITS tablet Take 2,000 Units by mouth daily.     Yes [provider]  fondaparinux (ARIXTRA) 2.5 MG/0.5ML SOLN injection INJECT 0.5ML INTO THE SKIN EVERY OTHER DAY 12/01/16  Yes Annia Belt, MD  furosemide (LASIX) 20 MG tablet Take 40mg  (2 tabs) daily for 4 days then take 20mg  (1 tab) daily. Patient taking differently: Take 20 mg by mouth daily.  04/12/17  Yes Alphonzo Grieve, MD  hydrALAZINE (APRESOLINE) 25 MG tablet Take 0.5 tablets (12.5 mg total) by mouth 3 (three) times daily. 03/05/17 06/03/17 Yes Larey Dresser, MD  isosorbide mononitrate (IMDUR) 30 MG 24 hr tablet Take 1 tablet (30 mg total) by mouth daily. 03/05/17 06/03/17 Yes Larey Dresser, MD  levothyroxine (SYNTHROID, LEVOTHROID) 88 MCG tablet Take 88 mcg by mouth daily before breakfast.   Yes [provider]  Lubricants (SURGICAL LUBRICANT) gel Apply 1 application topically as needed.   Yes [provider]  metoprolol succinate (TOPROL-XL) 50 MG 24 hr tablet TAKE ONE (1) TABLET BY MOUTH TWO (2) TIMES DAILY TAKE WITH OR IMMEDIATELY FOLLOWING A MEAL Patient taking differently: TAKE ONE 50 mg TABLET BY MOUTH TWO (2) TIMES DAILY TAKE WITH OR IMMEDIATELY FOLLOWING A MEAL 02/28/17  Yes Crenshaw, Denice Bors, MD  Misc Natural Products (TART CHERRY ADVANCED PO) Take 2 tablets by mouth daily.   Yes [provider]  oxyCODONE (ROXICODONE) 5 MG immediate release tablet Take 1-2 tablets (5-10 mg total) by mouth every 6 (six) hours as needed for moderate pain or severe pain. 04/12/17  Yes Alphonzo Grieve, MD  valACYclovir (VALTREX) 1000 MG tablet Take 1,000 mg by mouth as needed. For a total of 3 doses for labial herpes prn only    Yes [provider]  zolpidem (AMBIEN) 5 MG tablet Take 1 tablet (5 mg total) by mouth at bedtime as needed for sleep. 03/30/17  Yes Annia Belt, MD   PMH: 1. Rheumatic heart disease: Mitral valvuloplasty 1995 at [facility].  Later had mechanical MV replacement and tricuspid annuloplasty at [facility].   2. CVA: Embolic, at time of mitral valve surgery.  Had left hemiparesis.  3. H/o HIT 4. 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    5. Hypothyroidism: Hashimoto's thyroiditis.  6. Atypical atrial flutter: s/p ablation in 8/12 and repeat in 9/12 [facility]).  7. Chronic atrial fibrillation 8. Torsades de pointes with amiodarone use.  9. Antiphospholipid antibody syndrome: Anticoagulated with fondaparinus.  10. CKD stage 3 11. Chronic systolic CHF: St Jude CRT-P device.  Echo (4/15) with EF 45-50%.  Echo (6/15) with EF 35-40%, diffuse hypokinesis, normal mechanical mitral valve with mild MR, tricuspid valve s/p repair.  - Adenosine Cardiolite (10/15) with EF 35%, fixed apical anterior, apical septal, and apical defect with no ischemia.  Scar versus pacing artifact.  - Echo 02/14/17 LVEF 10-15%, Mild AI, Mod LAE, Mod RV dilation, RV systolic function moderately reduced, Mod TR, PA peak pressure 54 mm Hg.  - Lexiscan Cardiolite (9/18) with EF 42%, fixed apical anteroseptal defect with no ischemia.  -  Echo (10/18): EF 30-35%, mild LVH, mild AI, mechanical mitral valve functioned normally, PASP 54 mmHg.  12. ITP: Followed by Dr. Beryle Beams.  13. Left forearm ulceration: After IV and skin tear.  S/p debridement 04/11/17.   Past Surgical History: Past Surgical History:  Procedure Laterality Date  . AMPUTATION     left atrial appendage amputated with MVR  . APPLICATION OF WOUND VAC Left 04/11/2017   Procedure: APPLICATION OF WOUND VAC AND ACELL;  Surgeon: Wallace Going, DO;  Location: [place];  Service: Plastics;  Laterality: Left;  . ATRIAL ABLATION SURGERY     CTI and R atriostomy scar flutter ablation at [facility] 8/12,  repeat  ablation at [facility]  9/12  . BI-VENTRICULAR PACEMAKER INSERTION N/A 03/28/2012   Procedure: BI-VENTRICULAR PACEMAKER INSERTION (CRT-P);  Surgeon: Evans Lance, MD;  Location[facility] C[facility];  Service: Cardiovascular;  Laterality: N/A;  . biventricular pacemaker placement  03/28/2012   SJM Anthem implanted by Dr Lovena Le  . CARDIAC ELECTROPHYSIOLOGY [place] AND ABLATION  2012   "2 @ [facility]" (03/28/2012  . CARDIAC  VALVE REPLACEMENT    . CARDIOVERSION  2010-2011   "3 at [facility]" (03/28/2012)  . CHOLECYSTECTOMY    . CRANIOTOMY  2008   for SDH  . I&D EXTREMITY Left 04/11/2017   Procedure: EXCISION DEBRIDEMENT OF LEFT ARM WOUND;  Surgeon: Wallace Going, DO;  Location: [place];  Service: Plastics;  Laterality: Left;  . IR FLUORO GUIDE CV MIDLINE PICC RIGHT  04/09/2017  . IR US GUIDE VASC ACCESS RIGHT  04/09/2017  . MITRAL VALVE REPLACEMENT  2007   with tricuspid annuloplasty  . [place] with subsequent mechanical mitral valve replacement and tricuspid annuloplasty at [facility]  . TONSILLECTOMY    . TONSILLECTOMY     "when I was a child"  . TOTAL ABDOMINAL HYSTERECTOMY  ?1995   TAH; BSO    Family History: Family History  Problem Relation Age of Onset  . Heart disease Father        late 23's early 80's-CABG  . Dementia Father   . Diabetes Father   . Melanoma Father   . Cancer - Colon Mother   . Cancer - Lung Brother     Social History: Social History   Social History  . Marital status: Married    Spouse name: Alease Medina   . Number of children: 0  . Years of education: college   Occupational History  . PHYSICIAN Retired    retired   Social History Main Topics  . Smoking status: Former Smoker    Packs/day: 1.00    Years: 5.00    Types: Cigarettes    Quit date: 04/09/1992  . Smokeless tobacco: Never Used     Comment: 03/28/2012 "smoked in the 1970's"  . Alcohol use 0.0 oz/week     Comment: wine rarely.  . Drug use: No  . Sexual activity: Yes    Partners: Male    Birth control/ protection: None   Other Topics Concern  . 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 Lives at home with her husband.  Quit smoking 25 years ago and quit alcohol 2 years ago.   Denies alcohol, caffeine,  and illicit drug use.      Allergies:  Allergies  Allergen Reactions  . Amiodarone Hcl Other (See  Comments)    "torsades; v tach"  .  Coumadin [Warfarin Sodium] Other (See Comments)    "bled into my head"  . 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 Vitamin K Anaphylaxis    IV only allergy  . Iodinated Diagnostic Agents Itching    "over 35 years ago" (03/28/2012)  . Metrizamide Itching    "over 35 years ago" (03/28/2012)    Objective:    Vital Signs:   Temp:  [98.2 F (36.8 C)-100.6 F (38.1 C)] 100.6 F (38.1 C) (10/23 1850) Pulse Rate:  [97-126] 97 (10/23 2130) Resp:  [11-34] 20 (10/23 2130) BP: (52-104)/(39-91) 83/57 (10/23 2130) SpO2:  [96 %-100 %] 100 % (10/23 2130)    Weight change: There were no vitals filed for this visit.  Intake/Output:  No intake or output data in the 24 hours ending 04/24/2017 2159    Physical Exam    General:  Chronically ill-appearing, drowsy HEENT: normal Neck: supple. JVP 10-12 cm. Carotids 2+ bilat; no bruits. No lymphadenopathy or thyromegaly appreciated. Cor: Lateral PMI. Mildly tachy, irregular rate & rhythm.  Mechanical S1. No rubs, gallops.  2/6 HSM LLSB.  Lungs: clear bilaterally Abdomen: soft, nontender, nondistended. No hepatosplenomegaly. No bruits or masses. Good bowel sounds. Extremities: no cyanosis, clubbing, rash, edema Neuro: alert & orientedx3, cranial nerves grossly intact. moves all 4 extremities w/o difficulty. Affect pleasant Skin: Left forearm with extensive ulceration and greenish drainage.    Telemetry   Atrial fibrillation in the 100s (personally reviewed)  EKG    Atrial fibrillation rate 127 with LBBB (personally reviewed)  Labs   Basic Metabolic Panel:  Recent Labs Lab 04/11/17 0428 04/12/17 0443  NA 129* 134*  K 3.7 3.5  CL 98* 102  CO2 21* 22  GLUCOSE 117* 100*  BUN 108* 99*  CREATININE 2.78* 2.89*  CALCIUM 8.0* 7.8*  MG 2.3 2.2   03/27/2017: K 4.8 Creatinine 3.52 WBCs 14 hgb 9.4 plts 50,000  Liver Function Tests: No results for input(s): AST, ALT, ALKPHOS, BILITOT, PROT, ALBUMIN  in the last 168 hours. No results for input(s): LIPASE, AMYLASE in the last 168 hours. No results for input(s): AMMONIA in the last 168 hours.  CBC:  Recent Labs Lab 04/11/17 0428 04/12/17 0443  WBC 7.0 6.2  HGB 8.7* 7.8*  HCT 27.9* 24.6*  MCV 86.4 85.7  PLT 52* 67*    Cardiac Enzymes:  Recent Labs Lab 04/06/2017 1840  TROPONINI 24.93*    BNP: BNP (last 3 results) No results for input(s): BNP in the last 8760 hours.  ProBNP (last 3 results) No results for input(s): PROBNP in the last 8760 hours.   CBG: No results for input(s): GLUCAP in the last 168 hours.  Coagulation Studies:  Recent Labs  04/16/2017 1840  LABPROT 15.8*  INR 1.27     Imaging   Dg Chest 2 View  Result Date: 04/19/2017 CLINICAL DATA:  Shortness of Breath EXAM: CHEST  2 VIEW COMPARISON:  03/29/2022 FINDINGS: Cardiac shadow is enlarged. Postsurgical changes are noted. Pacing device is again seen and stable. No focal infiltrate or sizable effusion is seen. No bony abnormality is noted. IMPRESSION: No active cardiopulmonary disease. Electronically Signed   By: Inez Catalina M.D.   On: 04/20/2017 12:46   Nm Pulmonary Vent And Perf (v/q Scan)  Result Date: 03/31/2017 CLINICAL DATA:  Status post surgery with difficulty breathing and shortness of breath EXAM: NUCLEAR MEDICINE VENTILATION - PERFUSION LUNG SCAN TECHNIQUE: Ventilation images were obtained in multiple projections using inhaled aerosol Tc-34m DTPA. Perfusion images  were obtained in multiple projections after intravenous injection of Tc-27m MAA. Technologist reports that no lateral views were done secondary to recent left arm surgery. RADIOPHARMACEUTICALS:  31.8 mCi Technetium-70m DTPA aerosol inhalation and 4.2 mCi Technetium-16m MAA IV COMPARISON:  Radiograph 03/31/2017 FINDINGS: Ventilation: Mildly heterogeneous ventilation. Perfusion: No wedge shaped peripheral perfusion defects to suggest acute pulmonary embolism. Cardiomegaly. IMPRESSION:  No significant perfusion defects allowing for absence of lateral views for reasons above. Overall very low probability for pulmonary embolism (cardiolegaly) . Electronically Signed   By: Donavan Foil M.D.   On: 04/06/2017 21:09   Dg Chest Port 1 View  Result Date: 04/11/2017 CLINICAL DATA:  Acute onset of generalized weakness and exertional shortness of breath. Elevated D-dimer and elevated troponin. Initial encounter. EXAM: PORTABLE CHEST 1 VIEW COMPARISON:  Chest radiograph performed earlier today at 12:30 p.m. FINDINGS: The lungs are well-aerated and clear. There is no evidence of focal opacification, pleural effusion or pneumothorax. The cardiomediastinal silhouette is mildly enlarged. The patient is status post median sternotomy. A pacemaker is noted overlying the left chest wall, with leads ending overlying the right atrium, right ventricle and coronary sinus. No acute osseous abnormalities are seen. IMPRESSION: Mild cardiomegaly.  Lungs remain grossly clear. Electronically Signed   By: Garald Balding M.D.   On: 04/13/2017 21:30      Medications:     Current Medications:   Infusions: . sodium chloride    . sodium chloride    . ceFEPime (MAXIPIME) IV    . vancomycin 1,500 mg (04/05/2017 2058)       Patient Profile   71 y.o. female with extensive past history including mechanical MV and TV repair, chronic systolic CHF with EF 92-42%, chronic atrial fibrillation, prior CVA, antiphospholipid antibody syndrome, subdural hematomas x 2, and CKD presents with suspected septic shock.   Assessment/Plan   1. Shock: Leukocytosis, fever, infected wound => these factors point towards septic shock.  Her wound has grown Pseudomonas and has green pus noted.  - Start cefepime and vancomyin for now.  - She has responded to fluid bolus with improved BP but would be careful of overly aggressive fluid repletion.  On exam, JVP is elevated though CXR did not show pulmonary edema.  Needs central line,  then follow CVP.  She is DNR/DNI so would not be intubated if she developed severe pulmonary edema. If CVP is > 10-12 and BP remains low, would add pressor (norepinephrine gtt ideally but can use phenylephrine if HR is markedly elevated).  2. Acute on chronic systolic CHF: Has St Jude CRT-P device.  EF 30-35% on 10/18 echo. As above, she is probably mildly volume overloaded so would be careful with overly aggressive volume repletion. As above, if needs pressor would aim for norepinephrine gtt ideally but could use phenylephrine if HR markedly high.  3. Elevated troponin: Troponin elevation to 25.  ECG shows LBBB which she has had in past. No chest pain.  No history of coronary disease.  In the setting of suspected Pseudomonal septic shock, elevated troponin is most likely due to demand ischemia.  Regardless, she is not in a position to have cardiac cath (no chest pain, elevated creatinine, active infection).  - Anticoagulate with argatroban while she is off fondaparinux (history of HIT).   4. ID: As above, suspect Pseudomonal sepsis.  Treating with cefepime/vancomycin.  Source is likely arm wound.  May need repeat surgical debridement eventually.  5. Rheumatic heart disease: S/p MV replacement (mechanical) and TV repair.  Echo in 10/18 showed normal mechanical mitral valve. She is anticoagulated with fondaparinux chronically given subdural hematomas x 2 on coumadin.  She is not on aspirin given low platelets.   - As above, will use argatroban gtt while off fondaparinux.  6. Atrial fibrillation: Permanent.  Patient has failed DCCV and amiodarone caused torsades.  HR elevated in setting of septic shock, improving with IV fluid.  - As above, will be anticoagulated with argatroban.  7. CKD:  Stage 3-4.  Creatinine has varied between 2.8-3.3 recently.  It is up to 3.5 today in the setting of septic shock.  Hopefully will improve with sepsis treatment.  She would not want permanent hemodialysis. Is still thinking  about whether she would accept a limited course of CVVH if renal function worsened.  8. APLAS: Stable.  9. ITP: Platelets 50K today.  She is on Promacta now.   10. H/o CVA: Mild residual left-sided weakness.   Length of Stay: 0  Loralie Champagne, MD  04/22/2017, 9:59 PM  Advanced Heart Failure Team Pager [phone] (M-F; 7a - 4p)  Please contact [facility] for night-coverage after hours (4p -7a ) and weekends on [email or web address]

## 2017-04-17 NOTE — ED Triage Notes (Signed)
Pt arrives to the ED via [facility] with reports of weakness. Pt recently hospitalized for infection of her L arm and discharged 10/18. Since then pt has had generalized weakness and exertional SOB. Pt wwent for follow up today and received a call from her MD to come to the ED for elevated Ddimer (1.13) and elevated troponin (0.54). Pt denies CP/N/V/D.

## 2017-04-17 NOTE — Progress Notes (Signed)
ANTICOAGULATION CONSULT NOTE - Initial Consult  Pharmacy Consult for argatroban Indication: MVR and antiphospholipid antibody syndrome  Allergies  Allergen Reactions  . Amiodarone Hcl Other (See Comments)    "torsades; v tach"  . Coumadin [Warfarin Sodium] Other (See Comments)    "bled into my head"  . 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 Vitamin K Anaphylaxis    IV only allergy  . Iodinated Diagnostic Agents Itching    "over 35 years ago" (03/28/2012)  . Metrizamide Itching    "over 35 years ago" (03/28/2012)    Patient Measurements:   Heparin Dosing Weight: 73.2 kg  Vital Signs: Temp: 98.4 F (36.9 C) (10/23 2207) Temp Source: Oral (10/23 2207) BP: 83/57 (10/23 2130) Pulse Rate: 97 (10/23 2130)  Labs:  Recent Labs  04/02/2017 1840  LABPROT 15.8*  INR 1.27  TROPONINI 24.93*    Estimated Creatinine Clearance: 17.4 mL/min (A) (by C-G formula based on SCr of 2.89 mg/dL (H)).   Medical History: Past Medical History:  Diagnosis Date  . Acute ITP ([place]) 10/09/2016  . Anemia   . Antiphospholipid antibody syndrome ([place])   . Arthritis    "may have some" (03/28/2012)  . Atrial flutter ([place])    atypical right atrial flutter s/p ablation at [facility] by Dr Clyda Hurdle  . Cerebrovascular accident [facility]) ?1990; 1993; 2007   residual "can't use my right hand; left visual field cut" (03/28/2012)  . CHF (congestive heart failure) ([place])   . 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 Chronic kidney disease    "as a result of my heart failing" (03/28/2012)  . Fine motor impairment    "fingers right hand" (03/28/2012)  . Heart murmur   . 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 History of blood transfusion    "lots; most in 2007 w/MVR OR"  . History of Hashimoto thyroiditis   . HLD (hyperlipidemia)   . HTN (hypertension)   . Hypothyroidism   . Lymphedema of left arm 03/30/2017  . 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 Pacemaker   . Rheumatic heart  disease 1995   post percutaneous valvuloplasty at [facility] with subsequent mechanical mitral valve replacement and tricuspid annuloplasty at [facility]  . Right leg swelling 10/06/2013   Asymmetric; no calf pain, venous doppler negative for DVT 10/06/13  . Subdural hematoma ([place]) 2009   on coumadin    Assessment: 71 yo female with MVR and complicated anticoagulation history, currently managed by Dr. Beryle Beams on Arixtra 2.5 mg every other day.  Last dose of Arixtra this AM at 0800 per patient report.  Per Dr. Azucena Freed note from previous recent admission "  she has been anticoagulated with fondaparinux (Arixtra) maintaining low molecular weight heparin levels in the 0.8-1.1 range."  Goal of Therapy:  aPTT 50-90 seconds Monitor platelets by anticoagulation protocol: Yes   Plan:  1. Will check anti Xa level at 0800 tomorrow (24 hrs after last Arixtra). 2. Plan to start argatroban once anti Xa < 0.8. 3. Daily CBC.  Uvaldo Rising, BCPS  Clinical Pharmacist Pager [phone]  04/12/2017 10:22 PM

## 2017-04-17 NOTE — ED Notes (Signed)
This RN stayed with pt while in VQ scan due to low BP. Pt now back in room and Critical Care is at the bedside. 2nd IV team RN at bedside to attempt access.

## 2017-04-17 NOTE — Telephone Encounter (Signed)
Pt's husband called, explained 13 hour prep and states he will call if he or she has any questions.

## 2017-04-17 NOTE — ED Notes (Signed)
Paged [facility] to [employer]

## 2017-04-17 NOTE — Progress Notes (Signed)
Went down to ED for PIV 2nd assess and patient was not present. Went for procedure.  IV Team, Renaldo Fiddler, RN

## 2017-04-17 NOTE — ED Provider Notes (Signed)
[facility] EMERGENCY DEPARTMENT Provider Note   CSN: [id number] Arrival date & time: 04/08/2017  1627     History   Chief Complaint Chief Complaint  Patient presents with  . Abnormal Lab  . Weakness    HPI [name] is a 71 y.o. female.  She presents for evaluation of abnormal laboratory testing.  Earlier today, she saw her PCP for a hospital follow-up.  Since discharge 1 week ago she has been feeling weak, and having no energy.  Her appetite has been poor.  She is not currently taking antibiotics.  She was hospitalized last week for wound care, debridement, treated by plastic surgery.  She has congestive heart failure.  She has had previous renal insufficiency.  She denies chest pain.  Shortness of breath, since hospital discharge.  He is taking her usual medicines as prescribed.  There are no other known modifying factors.  HPI  Past Medical History:  Diagnosis Date  . Acute ITP ([place]) 10/09/2016  . Anemia   . Antiphospholipid antibody syndrome ([place])   . Arthritis    "may have some" (03/28/2012)  . Atrial flutter ([place])    atypical right atrial flutter s/p ablation at [facility] by Dr Clyda Hurdle  . Cerebrovascular accident [facility]) ?1990; 1993; 2007   residual "can't use my right hand; left visual field cut" (03/28/2012)  . CHF (congestive heart failure) ([place])   . 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 Chronic kidney disease    "as a result of my heart failing" (03/28/2012)  . Fine motor impairment    "fingers right hand" (03/28/2012)  . Heart murmur   . 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 History of blood transfusion    "lots; most in 2007 w/MVR OR"  . History of Hashimoto thyroiditis   . HLD (hyperlipidemia)   . HTN (hypertension)   . Hypothyroidism   . Lymphedema of left arm 03/30/2017  . 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 Pacemaker   . Rheumatic heart disease 1995   post percutaneous valvuloplasty at [facility] with subsequent mechanical mitral valve replacement and tricuspid annuloplasty at [facility]  . Right leg swelling 10/06/2013   Asymmetric; no calf pain, venous doppler negative for DVT 10/06/13  . Subdural hematoma ([place]) 2009   on coumadin    Patient Active Problem List   Diagnosis Date Noted  . Sepsis ([place]) 04/16/2017  . History of heparin-induced thrombocytopenia 04/10/2017  . Anemia, chronic renal failure, stage 4 (severe) (HCC)   . Arm wound, left, initial encounter 04/09/2017  . S/P MVR (mitral valve replacement)   . Arm pain, diffuse, left 03/30/2017  . Lymphedema of left arm 03/30/2017  . Encounter for wound care 03/30/2017  . 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 Acute ITP ([place]) 10/09/2016  . Nonischemic cardiomyopathy ([place]) 02/06/2014  . Edema 11/14/2013  . Right leg swelling 10/06/2013  . Spastic hemiplegia affecting nondominant side ([place]) 02/13/2013  . Pulmonary hypertension ([place]) 12/12/2012  . Primary hypercoagulable state ([place]) 12/12/2012  . Chronic anticoagulation 07/08/2012  . Atrial fibrillation ([place]) 04/17/2012  . Tachycardia-bradycardia ([place]) 04/17/2012  . Left bundle branch block 04/17/2012  . Status post biventricular pacemaker 03/29/2012  . Cardiac pacemaker in situ 03/29/2012  . Bradycardia 03/18/2012  . Antiphospholipid antibody with hypercoagulable state ([place]) 08/22/2011  . Thrombocytopenia ([place]) 07/26/2009  . Thrombocytopenia due to drugs 07/26/2009  . ESSENTIAL HYPERTENSION, BENIGN 07/26/2009  . DYSPNEA 07/26/2009  . HEMOPTYSIS  07/26/2009  . Dizziness 07/26/2009  . MITRAL VALVE REPLACEMENT, HX OF 07/26/2009  . Atrial flutter ([place]) 07/02/2009  . Acute on chronic combined systolic and diastolic CHF (congestive heart failure) ([place]) 07/02/2009  . CKD (chronic kidney disease) stage 4, GFR 15-29 ml/min (HCC) 07/02/2009    Past Surgical History:  Procedure Laterality Date  . AMPUTATION     left atrial appendage  amputated with MVR  . APPLICATION OF WOUND VAC Left 04/11/2017   Procedure: APPLICATION OF WOUND VAC AND ACELL;  Surgeon: Wallace Going, DO;  Location: [facility];  Service: Plastics;  Laterality: Left;  . ATRIAL ABLATION SURGERY     CTI and R atriostomy scar flutter ablation at [facility] 8/12,  repeat  ablation at [facility]  9/12  . BI-VENTRICULAR PACEMAKER INSERTION N/A 03/28/2012   Procedure: BI-VENTRICULAR PACEMAKER INSERTION (CRT-P);  Surgeon: Evans Lance, MD;  Location: [facility] CATH LAB;  Service: Cardiovascular;  Laterality: N/A;  . biventricular pacemaker placement  03/28/2012   SJM Anthem implanted by Dr Lovena Le  . CARDIAC ELECTROPHYSIOLOGY [place] AND ABLATION  2012   "2 @ [facility]" (03/28/2012  . CARDIAC VALVE REPLACEMENT    . CARDIOVERSION  2010-2011   "3 at [facility]" (03/28/2012)  . CHOLECYSTECTOMY    . CRANIOTOMY  2008   for SDH  . I&D EXTREMITY Left 04/11/2017   Procedure: EXCISION DEBRIDEMENT OF LEFT ARM WOUND;  Surgeon: Wallace Going, DO;  Location: [facility];  Service: Plastics;  Laterality: Left;  . IR FLUORO GUIDE CV MIDLINE PICC RIGHT  04/09/2017  . IR US GUIDE VASC ACCESS RIGHT  04/09/2017  . MITRAL VALVE REPLACEMENT  2007   with tricuspid annuloplasty  . [place] with subsequent mechanical mitral valve replacement and tricuspid annuloplasty at [facility]  . TONSILLECTOMY    . TONSILLECTOMY     "when I was a child"  . TOTAL ABDOMINAL HYSTERECTOMY  ?1995   TAH; BSO    OB History    No data available       Home Medications    Prior to Admission medications   Medication Sig Start Date End Date Taking? Authorizing Provider  acetaminophen (TYLENOL) 650 MG CR tablet Take 1,300 mg by mouth 3 (three) times daily as needed for pain.    Yes [provider]  Calcium Citrate (CITRACAL PO) Take 1 tablet by mouth daily.   Yes [provider]  Cholecalciferol (VITAMIN D) 2000 UNITS tablet Take 2,000 Units by mouth  daily.     Yes [provider]  fondaparinux (ARIXTRA) 2.5 MG/0.5ML SOLN injection INJECT 0.5ML INTO THE SKIN EVERY OTHER DAY 12/01/16  Yes Annia Belt, MD  furosemide (LASIX) 20 MG tablet Take 40mg  (2 tabs) daily for 4 days then take 20mg  (1 tab) daily. Patient taking differently: Take 20 mg by mouth daily.  04/12/17  Yes Alphonzo Grieve, MD  hydrALAZINE (APRESOLINE) 25 MG tablet Take 0.5 tablets (12.5 mg total) by mouth 3 (three) times daily. 03/05/17 06/03/17 Yes Larey Dresser, MD  isosorbide mononitrate (IMDUR) 30 MG 24 hr tablet Take 1 tablet (30 mg total) by mouth daily. 03/05/17 06/03/17 Yes Larey Dresser, MD  levothyroxine (SYNTHROID, LEVOTHROID) 88 MCG tablet Take 88 mcg by mouth daily before breakfast.   Yes [provider]  Lubricants (SURGICAL LUBRICANT) gel Apply 1 application topically as needed.   Yes [provider]  metoprolol succinate (TOPROL-XL) 50 MG 24 hr tablet TAKE ONE (  1) TABLET BY MOUTH TWO (2) TIMES DAILY TAKE WITH OR IMMEDIATELY FOLLOWING A MEAL Patient taking differently: TAKE ONE 50 mg TABLET BY MOUTH TWO (2) TIMES DAILY TAKE WITH OR IMMEDIATELY FOLLOWING A MEAL 02/28/17  Yes Crenshaw, Denice Bors, MD  Misc Natural Products (TART CHERRY ADVANCED PO) Take 2 tablets by mouth daily.   Yes [provider]  oxyCODONE (ROXICODONE) 5 MG immediate release tablet Take 1-2 tablets (5-10 mg total) by mouth every 6 (six) hours as needed for moderate pain or severe pain. 04/12/17  Yes Alphonzo Grieve, MD  valACYclovir (VALTREX) 1000 MG tablet Take 1,000 mg by mouth as needed. For a total of 3 doses for labial herpes prn only    Yes [provider]  zolpidem (AMBIEN) 5 MG tablet Take 1 tablet (5 mg total) by mouth at bedtime as needed for sleep. 03/30/17  Yes Annia Belt, MD    Family History Family History  Problem Relation Age of Onset  . Heart disease Father        late 82's early 80's-CABG  . Dementia Father   .  Diabetes Father   . Melanoma Father   . Cancer - Colon Mother   . Cancer - Lung Brother     Social History Social History  Substance Use Topics  . Smoking status: Former Smoker    Packs/day: 1.00    Years: 5.00    Types: Cigarettes    Quit date: 04/09/1992  . Smokeless tobacco: Never Used     Comment: 03/28/2012 "smoked in the 1970's"  . Alcohol use 0.0 oz/week     Comment: wine rarely.     Allergies   Amiodarone hcl; Coumadin [warfarin sodium]; Heparin; Vitamin k; Iodinated diagnostic agents; and Metrizamide   Review of Systems Review of Systems  All other systems reviewed and are negative.    Physical Exam Updated Vital Signs BP (!) 52/43   Pulse (!) 103   Temp (!) 100.6 F (38.1 C) (Rectal)   Resp (!) 29   SpO2 96%   Physical Exam  Constitutional: She is oriented to person, place, and time. She appears well-developed. She appears distressed (She is uncomfortable).  Elderly, frail  HENT:  Head: Normocephalic and atraumatic.  Eyes: Pupils are equal, round, and reactive to light. Conjunctivae and EOM are normal.  Neck: Normal range of motion and phonation normal. Neck supple.  Cardiovascular: Normal rate and regular rhythm.   Pulmonary/Chest: Effort normal and breath sounds normal. No respiratory distress. She exhibits no tenderness.  Abdominal: Soft. She exhibits no distension. There is no tenderness. There is no guarding.  Musculoskeletal: Normal range of motion.  Wound dressing, right forearm, is intact.  Normal range of motion arms and legs bilaterally.  No pretibial edema.  No calf tenderness.  Normal peripheral circulation, capillary refill.  Neurological: She is alert and oriented to person, place, and time. She exhibits normal muscle tone.  Skin: Skin is warm and dry.  Psychiatric: She has a normal mood and affect. Her behavior is normal. Judgment and thought content normal.  Nursing note and vitals reviewed.    ED Treatments / Results  Labs (all  labs ordered are listed, but only abnormal results are displayed) Labs Reviewed  PROTIME-INR - Abnormal; Notable for the following:       Result Value   Prothrombin Time 15.8 (*)    All other components within normal limits  TROPONIN I - Abnormal; Notable for the following:    Troponin I  24.93 (*)    All other components within normal limits  I-STAT CG4 LACTIC ACID, ED - Abnormal; Notable for the following:    Lactic Acid, Venous 5.13 (*)    All other components within normal limits  CULTURE, BLOOD (ROUTINE X 2)  CULTURE, BLOOD (ROUTINE X 2)  BASIC METABOLIC PANEL  PROCALCITONIN  PROCALCITONIN  LACTIC ACID, PLASMA  LACTIC ACID, PLASMA              EKG  EKG Interpretation  Date/Time:  Tuesday April 17 2017 20:47:29 EDT Ventricular Rate:  120 PR Interval:    QRS Duration: 129 QT Interval:  377 QTC Calculation: 533 R Axis:   149 Text Interpretation:  Atrial-paced complexes Nonspecific intraventricular conduction delay Probable lateral infarct, age indeterminate Anteroseptal infarct, old Since last tracing of earlier today Rate slower and repolarization abnormality has resolved Confirmed by Daleen Bo [phone]) on 04/14/2017 9:03:29 PM       Radiology Dg Chest 2 View  Result Date: 03/29/2017 CLINICAL DATA:  Shortness of Breath EXAM: CHEST  2 VIEW COMPARISON:  03/29/2022 FINDINGS: Cardiac shadow is enlarged. Postsurgical changes are noted. Pacing device is again seen and stable. No focal infiltrate or sizable effusion is seen. No bony abnormality is noted. IMPRESSION: No active cardiopulmonary disease. Electronically Signed   By: Inez Catalina M.D.   On: 04/13/2017 12:46   Nm Pulmonary Vent And Perf (v/q Scan)  Result Date: 04/19/2017 CLINICAL DATA:  Status post surgery with difficulty breathing and shortness of breath EXAM: NUCLEAR MEDICINE VENTILATION - PERFUSION LUNG SCAN TECHNIQUE: Ventilation images were obtained in multiple projections using inhaled aerosol  Tc-16m DTPA. Perfusion images were obtained in multiple projections after intravenous injection of Tc-3m MAA. Technologist reports that no lateral views were done secondary to recent left arm surgery. RADIOPHARMACEUTICALS:  31.8 mCi Technetium-17m DTPA aerosol inhalation and 4.2 mCi Technetium-24m MAA IV COMPARISON:  Radiograph 04/20/2017 FINDINGS: Ventilation: Mildly heterogeneous ventilation. Perfusion: No wedge shaped peripheral perfusion defects to suggest acute pulmonary embolism. Cardiomegaly. IMPRESSION: No significant perfusion defects allowing for absence of lateral views for reasons above. Overall very low probability for pulmonary embolism (cardiolegaly) . Electronically Signed   By: Donavan Foil M.D.   On: 04/05/2017 21:09    Procedures .Critical Care Performed by: Daleen Bo Authorized by: Daleen Bo   Critical care provider statement:    Critical care time (minutes):  85   Critical care start time:  04/16/2017 4:35 PM   Critical care end time:  04/17/2017 9:19 PM   Critical care time was exclusive of:  Separately billable procedures and treating other patients   Critical care was necessary to treat or prevent imminent or life-threatening deterioration of the following conditions:  Cardiac failure and circulatory failure   Critical care was time spent personally by me on the following activities:  Blood draw for specimens, development of treatment plan with patient or surrogate, discussions with consultants, evaluation of patient's response to treatment, examination of patient, obtaining history from patient or surrogate, ordering and performing treatments and interventions, ordering and review of laboratory studies, ordering and review of radiographic studies, pulse oximetry, re-evaluation of patient's condition and review of old charts   I assumed direction of critical care for this patient from another provider in my specialty: no        (including critical care  time)  Medications Ordered in ED Medications  0.9 %  sodium chloride infusion (not administered)  vancomycin (VANCOCIN) 1,500 mg in sodium chloride 0.9 %  500 mL IVPB (1,500 mg Intravenous New Bag/Given 04/13/2017 2058)  ceFEPIme (MAXIPIME) 2 g in dextrose 5 % 50 mL IVPB (not administered)  0.9 %  sodium chloride infusion (not administered)  sodium chloride 0.9 % bolus 500 mL (500 mLs Intravenous New Bag/Given 04/14/2017 1841)  acetaminophen (TYLENOL) tablet 650 mg (650 mg Oral Given 03/28/2017 1939)  technetium TC 34M diethylenetriame-pentaacetic acid (DTPA) injection 63.8 millicurie (93.7 millicuries Inhalation Given 04/10/2017 1950)  technetium albumin aggregated (MAA) injection solution 4.2 millicurie (4.2 millicuries Intravenous Contrast Given 04/04/2017 2010)     Initial Impression / Assessment and Plan / ED Course  I have reviewed the triage vital signs and the nursing notes.  Pertinent labs & imaging results that were available during my care of the patient were reviewed by me and considered in my medical decision making (see chart for details).  Clinical Course as of Apr 17 2121  Tue Apr 17, 2017  1825 Case was discussed with pharmacist relative to her anticoagulation status.  According to the pharmacist patient is on Coumadin.  The patient states she is not on Coumadin.  She takes Arixtra  [EW]  1827 At this time the patient is complaining of shortness of breath, intermittently.  Currently she feels comfortable on 4 L nasal, with oxygen saturation 100%.  [EW]  3428   Date:today  Rate: 127  Rhythm: atrial fibrillation  QRS Axis: right  PR and QT Intervals: prolonged QTc  ST/T Wave abnormalities: normal  PR and QRS Conduction Disutrbances:nonspecific intraventricular conduction delay     [EW]  2004 Troponin markedly elevated. Will repeat EKG and contact Cardiology.  [EW]  2024 Case discussed with Dr. Algernon Huxley, cardiology, who feels like her primary process at this time, is sepsis,  despite her troponin being markedly elevated.  [EW]  2026 Case has been discussed with critical care team, who will send a team member here to see the patient and consider admission, either primarily, or assist with consultation if needed.  [EW]  2036 Patient's sister in the room, now, she is a physician, findings were discussed with her, including the very critical nature of the patient's condition at this time, all questions answered.  [EW]  2050 PCCM in ED evaluating patient  [EW]    Clinical Course User Index [EW] Daleen Bo, MD     Patient Vitals for the past 24 hrs:  BP Temp Temp src Pulse Resp SpO2  04/20/2017 2045 - - - - (!) 29 -  04/12/2017 2039 (!) 52/43 - - (!) 103 (!) 34 96 %  03/28/2017 2030 (!) 79/59 - - - (!) 31 -  04/16/2017 2000 (!) 85/68 - - (!) 125 (!) 32 100 %  03/30/2017 1945 (!) 70/58 - - (!) 126 (!) 28 100 %  04/10/2017 1930 - - - (!) 116 (!) 21 100 %  04/23/2017 1850 - (!) 100.6 F (38.1 C) Rectal - - -  04/04/2017 1830 (!) 89/63 - - (!) 117 (!) 23 100 %  04/25/2017 1815 (!) 77/63 - - (!) 112 (!) 27 100 %  03/31/2017 1730 (!) 104/91 - - (!) 116 18 100 %  04/08/2017 1715 91/72 - - (!) 113 (!) 27 100 %  03/30/2017 1700 93/64 - - (!) 115 (!) 24 99 %  04/17/17 1637 (!) 84/64 98.2 F (36.8 C) Oral (!) 114 (!) 27 100 %        Final Clinical Impressions(s) / ED Diagnoses   Final diagnoses:  Sepsis due to Pseudomonas  species [facility])  Acute non-ST elevation myocardial infarction (NSTEMI) ([place])  AKI (acute kidney injury) ([place])  Wound infection    Cardiovascular collapse, likely multifactorial, with AK I, sepsis, cardiac insufficiency and myocardial infarction.  Patient critically ill, requiring multiple consultations, and admission to intensive care unit.  Patient is high risk for mortality.  Nursing Notes Reviewed/ Care Coordinated Applicable Imaging Reviewed Interpretation of Laboratory Data incorporated into ED treatment   Plan: Admit  New Prescriptions New  Prescriptions   No medications on file     Daleen Bo, MD 05/01/2017 1221

## 2017-04-17 NOTE — Progress Notes (Signed)
Went down to see patient after procedure. NP at bedside aware that both arms were scanned with no success of PIV. Pt. Had a PICC the last admission. Will put line in patient when on the floor.  IV Team, Renaldo Fiddler, RN

## 2017-04-18 ENCOUNTER — Other Ambulatory Visit (HOSPITAL_COMMUNITY): Payer: Medicare Other

## 2017-04-18 LAB — MRSA PCR SCREENING: MRSA by PCR: NEGATIVE

## 2017-04-18 LAB — BASIC METABOLIC PANEL
ANION GAP: 15 (ref 5–15)
Anion gap: 25 — ABNORMAL HIGH (ref 5–15)
BUN: 96 mg/dL — ABNORMAL HIGH (ref 6–20)
BUN: 97 mg/dL — AB (ref 6–20)
CALCIUM: 7.9 mg/dL — AB (ref 8.9–10.3)
CHLORIDE: 97 mmol/L — AB (ref 101–111)
CO2: 15 mmol/L — AB (ref 22–32)
CO2: 9 mmol/L — AB (ref 22–32)
CREATININE: 4.42 mg/dL — AB (ref 0.44–1.00)
Calcium: 8.7 mg/dL — ABNORMAL LOW (ref 8.9–10.3)
Chloride: 98 mmol/L — ABNORMAL LOW (ref 101–111)
Creatinine, Ser: 4.12 mg/dL — ABNORMAL HIGH (ref 0.44–1.00)
GFR calc Af Amer: 12 mL/min — ABNORMAL LOW (ref >60)
GFR calc Af Amer: 11 mL/min — ABNORMAL LOW (ref >60)
GFR calc non Af Amer: 10 mL/min — ABNORMAL LOW (ref >60)
GFR calc non Af Amer: 9 mL/min — ABNORMAL LOW (ref >60)
GLUCOSE: 136 mg/dL — AB (ref 65–99)
GLUCOSE: 187 mg/dL — AB (ref 65–99)
POTASSIUM: 5.7 mmol/L — AB (ref 3.5–5.1)
Potassium: 5.7 mmol/L — ABNORMAL HIGH (ref 3.5–5.1)
SODIUM: 131 mmol/L — AB (ref 135–145)
Sodium: 128 mmol/L — ABNORMAL LOW (ref 135–145)

## 2017-04-18 LAB — CBC
HEMATOCRIT: 26.1 % — AB (ref 36.0–46.0)
HEMATOCRIT: 28.5 % — AB (ref 36.0–46.0)
Hemoglobin: 8.3 g/dL — ABNORMAL LOW (ref 12.0–15.0)
Hemoglobin: 8.7 g/dL — ABNORMAL LOW (ref 12.0–15.0)
MCH: 28.2 pg (ref 26.0–34.0)
MCH: 28.1 pg (ref 26.0–34.0)
MCHC: 31.8 g/dL (ref 30.0–36.0)
MCHC: 30.5 g/dL (ref 30.0–36.0)
MCV: 88.8 fL (ref 78.0–100.0)
MCV: 91.9 fL (ref 78.0–100.0)
PLATELETS: 24 10*3/uL — AB (ref 150–400)
PLATELETS: 30 10*3/uL — AB (ref 150–400)
RBC: 2.94 MIL/uL — ABNORMAL LOW (ref 3.87–5.11)
RBC: 3.1 MIL/uL — ABNORMAL LOW (ref 3.87–5.11)
RDW: 16.3 % — AB (ref 11.5–15.5)
RDW: 16.7 % — AB (ref 11.5–15.5)
WBC: 17 10*3/uL — ABNORMAL HIGH (ref 4.0–10.5)
WBC: 21 10*3/uL — AB (ref 4.0–10.5)

## 2017-04-18 LAB — LACTIC ACID, PLASMA
Lactic Acid, Venous: 5.8 mmol/L (ref 0.5–1.9)
Lactic Acid, Venous: 14.4 mmol/L (ref 0.5–1.9)

## 2017-04-18 LAB — PROCALCITONIN
PROCALCITONIN: 1.84 ng/mL
PROCALCITONIN: 1.56 ng/mL

## 2017-04-18 LAB — MAGNESIUM
Magnesium: 2.4 mg/dL (ref 1.7–2.4)
Magnesium: 2.7 mg/dL — ABNORMAL HIGH (ref 1.7–2.4)

## 2017-04-18 LAB — POCT I-STAT 3, VENOUS BLOOD GAS (G3P V)
ACID-BASE DEFICIT: 5 mmol/L — AB (ref 0.0–2.0)
Bicarbonate: 17.4 mmol/L — ABNORMAL LOW (ref 20.0–28.0)
O2 Saturation: 50 %
PCO2 VEN: 24.3 mmHg — AB (ref 44.0–60.0)
PO2 VEN: 24 mmHg — AB (ref 32.0–45.0)
Patient temperature: 98
TCO2: 18 mmol/L — ABNORMAL LOW (ref 22–32)
pH, Ven: 7.463 — ABNORMAL HIGH (ref 7.250–7.430)

## 2017-04-18 LAB — PHOSPHORUS
Phosphorus: 5.6 mg/dL — ABNORMAL HIGH (ref 2.5–4.6)
Phosphorus: 7.9 mg/dL — ABNORMAL HIGH (ref 2.5–4.6)

## 2017-04-18 LAB — TROPONIN I: Troponin I: 25.96 ng/mL (ref <0.03)

## 2017-04-18 LAB — CORTISOL: CORTISOL PLASMA: 42 ug/dL

## 2017-04-18 MED ORDER — VASOPRESSIN 20 UNIT/ML IV SOLN
0.0300 [IU]/min | INTRAVENOUS | Status: DC
  Filled 2017-04-18: qty 2

## 2017-04-18 MED ORDER — SODIUM BICARBONATE 8.4 % IV SOLN
100.0000 meq | Freq: Once | INTRAVENOUS | Status: AC
  Administered 2017-04-18: 100 meq via INTRAVENOUS
  Filled 2017-04-18: qty 100

## 2017-04-18 MED ORDER — HYDROCORTISONE NA SUCCINATE PF 100 MG IJ SOLR
50.0000 mg | Freq: Four times a day (QID) | INTRAMUSCULAR | Status: DC
  Administered 2017-04-18: 50 mg via INTRAVENOUS
  Filled 2017-04-18: qty 2

## 2017-04-18 MED ORDER — LORAZEPAM 2 MG/ML IJ SOLN
0.5000 mg | INTRAMUSCULAR | Status: DC | PRN
  Administered 2017-04-18: 1 mg via INTRAVENOUS
  Filled 2017-04-18: qty 1

## 2017-04-18 MED ORDER — SODIUM CHLORIDE 0.9 % IV SOLN
1.2500 ng/kg/min | INTRAVENOUS | Status: DC
  Filled 2017-04-18: qty 1

## 2017-04-18 MED ORDER — SODIUM POLYSTYRENE SULFONATE 15 GM/60ML PO SUSP
30.0000 g | Freq: Once | ORAL | Status: DC
  Filled 2017-04-18: qty 120

## 2017-04-18 MED ORDER — SODIUM CHLORIDE 0.9 % IV SOLN
0.0000 ug/min | INTRAVENOUS | Status: DC
  Administered 2017-04-18 (×2): 200 ug/min via INTRAVENOUS
  Filled 2017-04-18 (×3): qty 4

## 2017-04-18 MED ORDER — VANCOMYCIN HCL IN DEXTROSE 750-5 MG/150ML-% IV SOLN: 750.0000 mg | INTRAVENOUS | Status: DC

## 2017-04-18 MED ORDER — WHITE PETROLATUM EX OINT
TOPICAL_OINTMENT | CUTANEOUS | Status: DC | PRN
  Filled 2017-04-18 (×2): qty 28.35

## 2017-04-18 MED ORDER — CALCIUM GLUCONATE 10 % IV SOLN
1.0000 g | Freq: Once | INTRAVENOUS | Status: AC
  Administered 2017-04-18: 1 g via INTRAVENOUS
  Filled 2017-04-18: qty 10

## 2017-04-18 MED ORDER — NOREPINEPHRINE BITARTRATE 1 MG/ML IV SOLN
0.0000 ug/min | INTRAVENOUS | Status: DC
  Administered 2017-04-18: 10 ug/min via INTRAVENOUS
  Filled 2017-04-18: qty 4

## 2017-04-18 MED ORDER — DEXTROSE 5 % IV SOLN: 1.0000 g | INTRAVENOUS | Status: DC

## 2017-04-18 MED ORDER — STERILE WATER FOR INJECTION IV SOLN
INTRAVENOUS | Status: DC
  Filled 2017-04-18: qty 850

## 2017-04-19 ENCOUNTER — Other Ambulatory Visit: Payer: Medicare Other

## 2017-04-20 ENCOUNTER — Telehealth: Payer: Self-pay

## 2017-04-20 NOTE — Telephone Encounter (Signed)
On 04/20/17 I received a d/c from [facility] (original). The d/c is for cremation. The patient is a patient of [facility]. The d/c will be taken to [facility] [address] this pm and see if Doctor Halford Chessman can sign the d/c.  On 04/23/17 I received the d/c back from Doctor Halford Chessman who signed the d/c for Doctor Hammonds.  I got the d/c ready and called the funeral home to let them know the d/c is ready for pickup. I also faxed a copy to the funeral home per the funeral home request.

## 2017-04-23 ENCOUNTER — Telehealth: Payer: Self-pay | Admitting: Internal Medicine

## 2017-04-23 LAB — CULTURE, BLOOD (ROUTINE X 2)
Culture: NO GROWTH
SPECIAL REQUESTS: ADEQUATE

## 2017-04-23 NOTE — Telephone Encounter (Signed)
Attempted call x 2- line busy/sss

## 2017-04-23 NOTE — Telephone Encounter (Signed)
New Message  Pt husband call to get a  Mailing package to return pts device to the company. Please call back to discuss

## 2017-04-23 NOTE — Telephone Encounter (Signed)
Spoke w/ pt husband and informed him that I would order a return kit to his home address to send patient home monitor back to [facility]

## 2017-04-24 ENCOUNTER — Encounter (HOSPITAL_COMMUNITY): Payer: Medicare Other | Admitting: Cardiology

## 2017-04-26 NOTE — Progress Notes (Signed)
Maxed on Phenylephrine, still hypotensive. Not tolerating even low doses of Levophed due to Afib with RVR. Will start Vasopressin. If still hypotensive will add Giapreza. Patient refusing an A-line. Starting stress dose steroids.

## 2017-04-26 NOTE — Progress Notes (Signed)
Pharmacy Antibiotic Note  [name] is a 71 y.o. female admitted on 03/31/2017 with sepsis.  Pharmacy has been consulted for Vancomycin/Cefepime dosing. Scr up to 4.12 with CrCl ~10-15.   Plan: -Vancomycin 750 mg IV q48h -Cefepime 1g IV q24h -Trend WBC, temp, renal function  -Drug levels as indicated   Temp (24hrs), Avg:98.6 F (37 C), Min:97.3 F (36.3 C), Max:100.6 F (38.1 C)   Recent Labs Lab 04/11/17 0428 04/12/17 0443 04/16/2017 1847 04/20/2017 2325  WBC 7.0 6.2  --  17.0*  CREATININE 2.78* 2.89*  --  4.12*  LATICACIDVEN  --   --  5.13* 5.8*    Estimated Creatinine Clearance: 12.2 mL/min (A) (by C-G formula based on SCr of 4.12 mg/dL (H)).    Allergies  Allergen Reactions  . Amiodarone Hcl Other (See Comments)    "torsades; v tach"  . Coumadin [Warfarin Sodium] Other (See Comments)    "bled into my head"  . 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 Vitamin K Anaphylaxis    IV only allergy  . Iodinated Diagnostic Agents Itching    "over 35 years ago" (03/28/2012)  . Metrizamide Itching    "over 35 years ago" (03/28/2012)    Narda Bonds 05-09-17 12:42 AM

## 2017-04-26 NOTE — Progress Notes (Signed)
Pt expired at [facility] on 05-03-2017.  Family was present in room at time of death.  Called to refer pt to [facility] who confirmed patient was not an ME case due to DNR.  Pt called in to [facility], she is not a donor candidate.  Pt belongings were sent home with family and patient was transferred to [facility] at [phone].

## 2017-04-26 NOTE — Progress Notes (Signed)
Patient is agitated and anxious. She says she feels "frantic." Will order Ativan IV PRN.

## 2017-04-26 NOTE — Progress Notes (Addendum)
[facility] Progress Note Patient Name: [name] DOB: 18-Sep-1945 MRN: [id number]   Date of Service  04-24-17  HPI/Events of Note  Hypotension - BP = 50/42 with MAP = 47. CVP = 27. HCO3- = 15 on BMP. Currently on a Phenylephrine IV infusion @ 100 mcg/min. K+ = 5.7 and nurse does not feel she can take Kayexalate PO and patient refuses Kayexalate rectally. Will continue NaHCO3 IV infusion for present. Unable to obtain ABG. Patient refusing to be stuck again.   eICU Interventions  Will order: 1. NaHCO3 100 meq IV now. 2. NaHCO3 IV infusion at 75 mL/hour.  3. Titrate Phenylephrine IV infusion to MAP > 65. 4. Repeat BMP in AM.     Intervention Category Major Interventions: Hypotension - evaluation and management  Kwame Ryland Eugene 2017-04-24, 12:43 AM

## 2017-04-26 NOTE — Death Summary Note (Signed)
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 Patient reports that she underwent cardiac stress test 9/18, after in which she developed a skin tear on the left forearm after removal of IV. The wound increased in size with swelling around the forearm and hand. Saw Dr. Beryle Beams and was treated with a course of steroids and antibiotics. Required debridement on 10/17. Wound cultures grew pseudomonas, patient decided not to treat with antibiotics until follow up appointment with Dr. Marla Roe. Since discharge on 10/20 patient reports that she has been progressively weak and tired and on 10/23 presented to the ER c/o weakness and SOB.   In ED, Temp 562.1, BP Systolic 30Q, Troponin 65.78, LA 5.13. Difficult IV access unable to obtain additional labs. Cardiology consulted.    Hospital Course by Problem list: 1. Septic Shock: presumed due to pseudomonas bacteremia from recent pseudomonas arm infection - patient brought to the ICU and RIJ Triple lumen catheter placed without complication. She was pan-cultured and started on Vancomycin and Cefepime. She was given 1L IVF in the ER. Appropriate 30cc/kg dose for her body weight would be over 2L which she could not tolerate given her advanced CHF. Attempted additional 500cc run in slowly but had to stop as she did not tolerate it. - started phenylephrine infusion, of which she required max dose. Attempted to start levophed as second vasopressor but she developed tachycardia in response to the infusion. The levophed was stopped and Vasopressin was ordered.  Ordered stress dose steroids. - CXR showed no infiltrates. UA showed no infection. Blood cultures pending. Left forearm with green foul smelling exudate present, consistent with infection. Plastic surgery was consulted to see patient given the recent arm operation and now with arm infection.  - CT of Left arm was ordered, but patient's family asked that we wait until the morning to perform it.  - discussed with patient in detail her wishes regarding how aggressive efforts should be. She was very clear that she did not want CPR, Intubation, Arterial line, Dialysis, Foley catheter. These wishes were honored.  - At 4:48am, patient's RN found patient to be apneic and with no pulse, in a PEA rhythm. Family present and notified that patient had died.   2. Acute Hypoxic Respiratory failure: - due to pulmonary edema likely noncardiogenic in the setting of sepsis. Patient intermittently was on Hiflo nasal cannula and Nonrebreather mask.  - CXR showed no infiltrates or effusions.  - VQ scan showed very low probability of PE  3. AKI-on-CKD; Hyperkalemia: - Creatinine 4.12 on admit (up from baseline of 2.6-3.3), worsened to 4.42 on repeat after IVFs given; likely this AKI is due to ATN from the Shock.  - Mild hyperkalemia to 5.7 that was stable on repeat. Calcium gluconate 1g IV given. Patient refused Kayexalate.   4. NSTEMI:  - troponin elevated to 24, increased to 25 on repeat - EKG showed no ST elevations - Cardiology was consulted and felt this was due to demand ischemia in the setting of shock; recommended supportive care - Started Argatroban gtt (had hx of Heparin induced thrombocytopenia). - ordered TTE  5. Afib with RVR:  -  Afib with rate in 120's on phenylephrine, unable to use amiodarone for rate control due to allergy. Felt her blood pressure would not tolerate Diltiazem or a Beta blocker. Not able to use Digoxin given the AKI.

## 2017-04-26 DEATH — deceased

## 2017-05-03 ENCOUNTER — Encounter: Payer: Medicare Other | Admitting: Nurse Practitioner

## 2017-05-03 ENCOUNTER — Other Ambulatory Visit (HOSPITAL_COMMUNITY): Payer: Medicare Other

## 2017-05-07 ENCOUNTER — Ambulatory Visit: Payer: Medicare Other | Admitting: Cardiology

## 2017-06-07 ENCOUNTER — Encounter (HOSPITAL_COMMUNITY): Payer: Medicare Other | Admitting: Cardiology
